# Patient Record
Sex: Female | Born: 1960
Health system: Southern US, Community
[De-identification: ages and names within clinical notes are randomized; demographics above are authoritative.]

## PROBLEM LIST (undated history)

## (undated) DIAGNOSIS — L732 Hidradenitis suppurativa: Secondary | ICD-10-CM

## (undated) DIAGNOSIS — E119 Type 2 diabetes mellitus without complications: Secondary | ICD-10-CM

## (undated) DIAGNOSIS — I10 Essential (primary) hypertension: Secondary | ICD-10-CM

## (undated) DIAGNOSIS — M7989 Other specified soft tissue disorders: Secondary | ICD-10-CM

## (undated) DIAGNOSIS — L97519 Non-pressure chronic ulcer of other part of right foot with unspecified severity: Secondary | ICD-10-CM

## (undated) HISTORY — DX: Hidradenitis suppurativa: L73.2

## (undated) HISTORY — DX: Other specified soft tissue disorders: M79.89

## (undated) HISTORY — DX: Non-pressure chronic ulcer of other part of right foot with unspecified severity: L97.519

## (undated) HISTORY — PX: HERNIA REPAIR: SHX51

## (undated) HISTORY — PX: TONSILLECTOMY: SUR1361

## (undated) HISTORY — PX: CHOLECYSTECTOMY: SHX55

---

## 1997-11-10 ENCOUNTER — Other Ambulatory Visit: Admission: RE | Admit: 1997-11-10 | Discharge: 1997-11-10 | Payer: Self-pay | Admitting: *Deleted

## 1998-03-31 ENCOUNTER — Other Ambulatory Visit: Admission: RE | Admit: 1998-03-31 | Discharge: 1998-03-31 | Payer: Self-pay | Admitting: Obstetrics and Gynecology

## 1998-04-26 ENCOUNTER — Ambulatory Visit (HOSPITAL_COMMUNITY): Admission: RE | Admit: 1998-04-26 | Discharge: 1998-04-26 | Payer: Self-pay | Admitting: Internal Medicine

## 1998-04-26 ENCOUNTER — Encounter: Payer: Self-pay | Admitting: Internal Medicine

## 1999-04-02 ENCOUNTER — Emergency Department (HOSPITAL_COMMUNITY): Admission: EM | Admit: 1999-04-02 | Discharge: 1999-04-02 | Payer: Self-pay | Admitting: Emergency Medicine

## 1999-05-20 ENCOUNTER — Other Ambulatory Visit: Admission: RE | Admit: 1999-05-20 | Discharge: 1999-05-20 | Payer: Self-pay | Admitting: Obstetrics and Gynecology

## 1999-12-07 ENCOUNTER — Encounter: Admission: RE | Admit: 1999-12-07 | Discharge: 1999-12-07 | Payer: Self-pay | Admitting: Family Medicine

## 1999-12-07 ENCOUNTER — Encounter (INDEPENDENT_AMBULATORY_CARE_PROVIDER_SITE_OTHER): Payer: Self-pay | Admitting: *Deleted

## 1999-12-07 ENCOUNTER — Encounter: Payer: Self-pay | Admitting: Family Medicine

## 2000-02-02 ENCOUNTER — Observation Stay (HOSPITAL_COMMUNITY): Admission: RE | Admit: 2000-02-02 | Discharge: 2000-02-03 | Payer: Self-pay | Admitting: General Surgery

## 2000-02-02 ENCOUNTER — Encounter (INDEPENDENT_AMBULATORY_CARE_PROVIDER_SITE_OTHER): Payer: Self-pay | Admitting: Specialist

## 2000-02-02 ENCOUNTER — Encounter (INDEPENDENT_AMBULATORY_CARE_PROVIDER_SITE_OTHER): Payer: Self-pay | Admitting: *Deleted

## 2000-02-02 ENCOUNTER — Encounter: Payer: Self-pay | Admitting: General Surgery

## 2000-05-25 ENCOUNTER — Other Ambulatory Visit: Admission: RE | Admit: 2000-05-25 | Discharge: 2000-05-25 | Payer: Self-pay | Admitting: Obstetrics and Gynecology

## 2001-07-05 ENCOUNTER — Other Ambulatory Visit: Admission: RE | Admit: 2001-07-05 | Discharge: 2001-07-05 | Payer: Self-pay | Admitting: Obstetrics and Gynecology

## 2002-08-12 ENCOUNTER — Other Ambulatory Visit: Admission: RE | Admit: 2002-08-12 | Discharge: 2002-08-12 | Payer: Self-pay | Admitting: Obstetrics and Gynecology

## 2003-12-10 ENCOUNTER — Encounter: Admission: RE | Admit: 2003-12-10 | Discharge: 2003-12-10 | Payer: Self-pay | Admitting: Obstetrics and Gynecology

## 2003-12-23 ENCOUNTER — Encounter: Admission: RE | Admit: 2003-12-23 | Discharge: 2003-12-23 | Payer: Self-pay | Admitting: Diagnostic Radiology

## 2003-12-23 ENCOUNTER — Encounter (INDEPENDENT_AMBULATORY_CARE_PROVIDER_SITE_OTHER): Payer: Self-pay | Admitting: *Deleted

## 2005-05-13 ENCOUNTER — Emergency Department (HOSPITAL_COMMUNITY): Admission: EM | Admit: 2005-05-13 | Discharge: 2005-05-13 | Payer: Self-pay | Admitting: Emergency Medicine

## 2006-12-07 ENCOUNTER — Encounter (INDEPENDENT_AMBULATORY_CARE_PROVIDER_SITE_OTHER): Payer: Self-pay | Admitting: *Deleted

## 2007-12-16 ENCOUNTER — Ambulatory Visit (HOSPITAL_COMMUNITY): Admission: RE | Admit: 2007-12-16 | Discharge: 2007-12-16 | Payer: Self-pay | Admitting: Obstetrics and Gynecology

## 2007-12-16 ENCOUNTER — Encounter (INDEPENDENT_AMBULATORY_CARE_PROVIDER_SITE_OTHER): Payer: Self-pay | Admitting: Obstetrics and Gynecology

## 2009-11-18 ENCOUNTER — Encounter (INDEPENDENT_AMBULATORY_CARE_PROVIDER_SITE_OTHER): Payer: Self-pay | Admitting: *Deleted

## 2009-12-24 ENCOUNTER — Encounter (INDEPENDENT_AMBULATORY_CARE_PROVIDER_SITE_OTHER): Payer: Self-pay | Admitting: *Deleted

## 2009-12-25 ENCOUNTER — Ambulatory Visit (HOSPITAL_COMMUNITY): Admission: RE | Admit: 2009-12-25 | Discharge: 2009-12-25 | Payer: Self-pay | Admitting: Internal Medicine

## 2009-12-25 ENCOUNTER — Ambulatory Visit: Payer: Self-pay | Admitting: Surgery

## 2009-12-25 ENCOUNTER — Encounter: Payer: Self-pay | Admitting: Family Medicine

## 2010-04-13 ENCOUNTER — Encounter (INDEPENDENT_AMBULATORY_CARE_PROVIDER_SITE_OTHER): Payer: Self-pay | Admitting: *Deleted

## 2010-05-23 ENCOUNTER — Ambulatory Visit
Admission: RE | Admit: 2010-05-23 | Discharge: 2010-05-23 | Payer: Self-pay | Source: Home / Self Care | Attending: Gastroenterology | Admitting: Gastroenterology

## 2010-05-23 ENCOUNTER — Encounter (INDEPENDENT_AMBULATORY_CARE_PROVIDER_SITE_OTHER): Payer: Self-pay | Admitting: *Deleted

## 2010-05-25 ENCOUNTER — Telehealth: Payer: Self-pay | Admitting: Internal Medicine

## 2010-05-29 ENCOUNTER — Encounter: Payer: Self-pay | Admitting: Obstetrics and Gynecology

## 2010-06-07 NOTE — Letter (Signed)
Summary: Pre Visit Letter Revised  [facility]  [address] [place], [place] [address]   Phone: [phone]  Fax: [phone]        04/13/2010 MRN: [id number] Natasha Rollins [address] [place], [place]  [address]             Procedure Date:  06/08/2010   Welcome to the Gastroenterology Division at [facility].    You are scheduled to see a nurse for your pre-procedure visit on 05/23/2010 at 4:00 on the 3rd floor at [facility], [address]  We ask that you try to arrive at our office 15 minutes prior to your appointment time to allow for check-in.  Please take a minute to review the attached form.  If you answer "Yes" to one or more of the questions on the first page, we ask that you call the person listed at your earliest opportunity.  If you answer "No" to all of the questions, please complete the rest of the form and bring it to your appointment.    Your nurse visit will consist of discussing your medical and surgical history, your immediate family medical history, and your medications.   If you are unable to list all of your medications on the form, please bring the medication bottles to your appointment and we will list them.  We will need to be aware of both prescribed and over the counter drugs.  We will need to know exact dosage information as well.    Please be prepared to read and sign documents such as consent forms, a financial agreement, and acknowledgement forms.  If necessary, and with your consent, a friend or relative is welcome to sit-in on the nurse visit with you.  Please bring your insurance card so that we may make a copy of it.  If your insurance requires a referral to see a specialist, please bring your referral form from your primary care physician.  No co-pay is required for this nurse visit.     If you cannot keep your appointment, please call [phone] to cancel or reschedule prior to your appointment date.  This allows  [place] the opportunity to schedule an appointment for another patient in need of care.    Thank you for choosing [facility] for your medical needs.  We appreciate the opportunity to care for you.  Please visit [place] at our website  to learn more about our practice.  Sincerely, The Gastroenterology Division

## 2010-06-07 NOTE — Letter (Signed)
Summary: Pre Visit No Show Letter  [facility]  [address] [place], [place] [address]   Phone: [phone]  Fax: [phone]        December 24, 2009 MRN: [id number]    Natasha Rollins [address] [place], [place]  [address]    Dear [name],   We have been unable to reach you by phone concerning the pre-procedure visit that you missed on Friday 12-24-09 . For this reason,your procedure scheduled on 01-05-10 has been cancelled. Our scheduling staff will gladly assist you with rescheduling your appointments at a more convenient time. Please call our office at [phone] between the hours of 8:00am and 5:00pm, press option #2 to reach an appointment scheduler. Please consider updating your contact numbers at this time so that we can reach you by phone in the future with schedule changes or results.    Thank you,    Sherren Kerns RN [facility]

## 2010-06-07 NOTE — Letter (Signed)
Summary: Previsit letter  [facility]  [address] [place], [place] [address]   Phone: [phone]  Fax: [phone]       11/18/2009 MRN: [id number]  Natasha Rollins [address] [place], [place]  [address]  Dear Natasha Rollins,  Welcome to the Gastroenterology Division at [facility].    You are scheduled to see a nurse for your pre-procedure visit on December 24, 2009 at 8:30am on the 3rd floor at [facility], [address].  We ask that you try to arrive at our office 15 minutes prior to your appointment time to allow for check-in.  Your nurse visit will consist of discussing your medical and surgical history, your immediate family medical history, and your medications.    Please bring a complete list of all your medications or, if you prefer, bring the medication bottles and we will list them.  We will need to be aware of both prescribed and over the counter drugs.  We will need to know exact dosage information as well.  If you are on blood thinners (Coumadin, Plavix, Aggrenox, Ticlid, etc.) please call our office today/prior to your appointment, as we need to consult with your physician about holding your medication.   Please be prepared to read and sign documents such as consent forms, a financial agreement, and acknowledgement forms.  If necessary, and with your consent, a friend or relative is welcome to sit-in on the nurse visit with you.  Please bring your insurance card so that we may make a copy of it.  If your insurance requires a referral to see a specialist, please bring your referral form from your primary care physician.  No co-pay is required for this nurse visit.     If you cannot keep your appointment, please call [phone] to cancel or reschedule prior to your appointment date.  This allows [place] the opportunity to schedule an appointment for another patient in need of care.    Thank you for choosing [facility] for your  medical needs.  We appreciate the opportunity to care for you.  Please visit [place] at our website  to learn more about our practice.                     Sincerely.                                                                                                                   The Gastroenterology Division

## 2010-06-09 ENCOUNTER — Telehealth: Payer: Self-pay | Admitting: Internal Medicine

## 2010-06-09 NOTE — Miscellaneous (Signed)
Summary: [facility] previsit  Clinical Lists Changes  Medications: Added new medication of DULCOLAX 5 MG  TBEC (BISACODYL) Day before procedure take 2 at 3pm and 2 at 8pm. - Signed Added new medication of METOCLOPRAMIDE HCL 10 MG  TABS (METOCLOPRAMIDE HCL) As per prep instructions. - Signed Added new medication of MIRALAX   POWD (POLYETHYLENE GLYCOL 3350) As per prep  instructions. - Signed Rx of DULCOLAX 5 MG  TBEC (BISACODYL) Day before procedure take 2 at 3pm and 2 at 8pm.;  #4 x 0;  Signed;  Entered by: Karl Bales RN;  Authorized by: Hart Carwin MD;  Method used: Electronically to [facility]. [facility]*, [address], [place], [place], [place]  [address], Ph: [phone] or [phone], Fax: [phone] Rx of METOCLOPRAMIDE HCL 10 MG  TABS (METOCLOPRAMIDE HCL) As per prep instructions.;  #2 x 0;  Signed;  Entered by: Karl Bales RN;  Authorized by: Hart Carwin MD;  Method used: Electronically to [facility]. [facility]*, [address] [place], [place], [place]  [address], Ph: [phone] or [phone], Fax: [phone] Rx of MIRALAX   POWD (POLYETHYLENE GLYCOL 3350) As per prep  instructions.;  #255gm x 0;  Signed;  Entered by: Karl Bales RN;  Authorized by: Hart Carwin MD;  Method used: Electronically to [facility]. [facility]*, [address], [place], [place], [place]  [address], Ph: [phone] or [phone], Fax: [phone] Allergies: Added new allergy or adverse reaction of PCN Observations: Added new observation of ALLERGY REV: Done (05/23/2010 16:11) Added new observation of NKA: F (05/23/2010 16:11)    Prescriptions: MIRALAX   POWD (POLYETHYLENE GLYCOL 3350) As per prep  instructions.  #255gm x 0   Entered by:   Karl Bales RN   Authorized by:   Hart Carwin MD   Signed by:   Karl Bales RN on 05/23/2010   Method used:   Electronically to        [facility]* (retail)       [address]       [place], [place]  [address]       Ph: [phone] or [phone]       Fax: [phone]   RxID:   [id number] METOCLOPRAMIDE HCL 10 MG  TABS (METOCLOPRAMIDE HCL) As per prep instructions.  #2 x 0   Entered by:   Karl Bales RN   Authorized by:   Hart Carwin MD   Signed by:   Karl Bales RN on 05/23/2010   Method used:   Electronically to        [facility]. [facility]* (retail)       [address]       [place], [place]  [address]       Ph: [phone] or [phone]       Fax: [phone]   RxID:   [id number] DULCOLAX 5 MG  TBEC (BISACODYL) Day before procedure take 2 at 3pm and 2 at 8pm.  #4 x 0   Entered by:   Karl Bales RN   Authorized by:   Hart Carwin MD   Signed by:   Karl Bales RN on 05/23/2010   Method used:   Electronically to        [facility]. [facility] (retail)       [address]       [place], [place]  [address]       Ph: [phone] or [phone]       Fax: [phone]   RxID:   [id number]

## 2010-06-09 NOTE — Letter (Signed)
Summary: Diabetic Instructions  [facility]  [address]   [place], [place] [address]   Phone: [phone]  Fax: [phone]    Yashica Binion February 27, 1961 MRN: [id number]   x    ORAL DIABETIC MEDICATION INSTRUCTIONS  The day before your procedure:   Take your diabetic pill as you do normally  The day of your procedure:   Do not take your diabetic pill    We will check your blood sugar levels during the admission process and again in Recovery before discharging you home  ________________________________________________________________________

## 2010-06-09 NOTE — Progress Notes (Signed)
Summary: Res ZCO  Phone Note Call from Patient Call back at [phone]   Call For: Dr Juanda Chance Summary of Call: Needs to change her ZCO scheduled on 06-14-2010. If possible wonders if 07-01-10 is available. It's a teacher's work day. Initial call taken by: Leanor Kail [facility],  May 25, 2010 3:22 PM  Follow-up for Phone Call        Patient advised that her procedure has to be completed at the hospital (due to weight restrictions) and that Dr Juanda Chance is only hospital Dr week of 06/14/10. I explained that we dont have many other options as the full March schedule is not out yet. She states she will actually just keep her 06/14/10 appointment as previously scheduled. Follow-up by: Lamona Curl CMA (AAMA),  May 25, 2010 3:30 PM

## 2010-06-09 NOTE — Letter (Signed)
Summary: Miralax Instructions  [facility]  [address]   [place], [place] [address]   Phone: [phone]  Fax: [phone]       Natasha Rollins    [birth year]    MRN: [id number]       Procedure Day Dorna Bloom: Tuesday, 06-14-10     Arrival Time: 7:30 a.m.     Procedure Time: 8:30 a.m.     Location of Procedure:                     x   [facility] ( Outpatient Registration)       PREPARATION FOR COLONOSCOPY WITH MIRALAX  Starting 5 days prior to your procedure 06-09-10 do not eat nuts, seeds, popcorn, corn, beans, peas,  salads, or any raw vegetables.  Do not take any fiber supplements (e.g. Metamucil, Citrucel, and Benefiber). ____________________________________________________________________________________________________   THE DAY BEFORE YOUR PROCEDURE         DATE: 06-13-10  DAY: Monday  1   Drink clear liquids the entire day-NO SOLID FOOD  2   Do not drink anything colored red or purple.  Avoid juices with pulp.  No orange juice.  3   Drink at least 64 oz. (8 glasses) of fluid/clear liquids during the day to prevent dehydration and help the prep work efficiently.  CLEAR LIQUIDS INCLUDE: Water Jello Ice Popsicles Tea (sugar ok, no milk/cream) Powdered fruit flavored drinks Coffee (sugar ok, no milk/cream) Gatorade Juice: apple, white grape, white cranberry  Lemonade Clear bullion, consomm, broth Carbonated beverages (any kind) Strained chicken noodle soup Hard Candy  4   Mix the entire bottle of Miralax with 64 oz. of Gatorade/Powerade in the morning and put in the refrigerator to chill.  5   At 3:00 pm take 2 Dulcolax/Bisacodyl tablets.  6   At 4:30 pm take one Reglan/Metoclopramide tablet.  7  Starting at 5:00 pm drink one 8 oz glass of the Miralax mixture every 15-20 minutes until you have finished drinking the entire 64 oz.  You should finish drinking prep around 7:30 or 8:00 pm.  8   If you are nauseated, you may take the 2nd  Reglan/Metoclopramide tablet at 6:30 pm.        9    At 8:00 pm take 2 more DULCOLAX/Bisacodyl tablets.     THE DAY OF YOUR PROCEDURE      DATE: 06-14-10 DAY: Tuesday  You may drink clear liquids until 4:30 a.m.  (4 HOURS BEFORE PROCEDURE).   MEDICATION INSTRUCTIONS  Unless otherwise instructed, you should take regular prescription medications with a small sip of water as early as possible the morning of your procedure.  Diabetic patients - see separate instructions.         OTHER INSTRUCTIONS  You will need a responsible adult at least 51 years of age to accompany you and drive you home.   This person must remain in the waiting room during your procedure.  Wear loose fitting clothing that is easily removed.  Leave jewelry and other valuables at home.  However, you may wish to bring a book to read or an iPod/MP3 player to listen to music as you wait for your procedure to start.  Remove all body piercing jewelry and leave at home.  Total time from sign-in until discharge is approximately 2-3 hours.  You should go home directly after your procedure and rest.  You can resume normal activities the day after your procedure.  The day  of your procedure you should not:   Drive   Make legal decisions   Operate machinery   Drink alcohol   Return to work  You will receive specific instructions about eating, activities and medications before you leave.   The above instructions have been reviewed and explained to me by   Karl Bales RN  May 23, 2010 4:50 PM    I fully understand and can verbalize these instructions _____________________________ Date _______

## 2010-06-14 ENCOUNTER — Other Ambulatory Visit: Payer: Self-pay | Admitting: Internal Medicine

## 2010-06-15 NOTE — Progress Notes (Signed)
Summary: Hospital Proceure  Phone Note Call from Patient Call back at [phone]   Caller: Patient Call For: dR. Jaydin Jalomo Reason for Call: Talk to Nurse Summary of Call: Pt is having an issue with her hopsital procedure and needs to speak with nurse Initial call taken by: [place] Johnson,  June 09, 2010 4:50 PM  Follow-up for Phone Call        Patient calling to ask about taking "pillls" as the prep for colonoscopy. Discussed with patient the prep Dr. Juanda Chance prefers and why. She understands. Patient also wanted to know if she would be "put to sleep" for the procedure because she has had trouble with anesthesia. Patient states that with her c-section she had trouble waking up, nausea and vomiting. She also asked about changing her procedure the March 5, 6, 7, 8, or 9th. Explained to her that she needs to have the procedure at the hospital and we are limited on the times for this. She will keep her 06/14/10 appointment. Follow-up by: Jesse Fall RN,  June 10, 2010 9:13 AM  Additional Follow-up for Phone Call Additional follow up Details #1::        Would she prefer to see me in the office to raise her concerns? She canceled in Sept.2011, and rescheduled this year and now she wants different prep. She may need to be Propofol case due to potential resipiratory issues, obesity? If it is just a screening colon there is no rush. I will be happy to meet with her first. Additional Follow-up by: Hart Carwin MD,  June 10, 2010 1:56 PM    Additional Follow-up for Phone Call Additional follow up Details #2::    Spoke with patient and she would like to talk with MD before having the procedure. Cancelled the colonoscopy at the hospital on 06/14/10 (spoke with Arlys John at [facility] endo). Scheduled patient to see MD on 07/15/10 at 3:15 PM. Follow-up by: Jesse Fall RN,  June 10, 2010 2:14 PM  Additional Follow-up for Phone Call Additional follow up Details #3:: Details for Additional Follow-up Action  Taken: thank You, it will work for both of [place], Thanx Additional Follow-up by: Hart Carwin MD,  June 10, 2010 2:15 PM

## 2010-07-08 DIAGNOSIS — Z6841 Body Mass Index (BMI) 40.0 and over, adult: Secondary | ICD-10-CM

## 2010-07-08 DIAGNOSIS — N83209 Unspecified ovarian cyst, unspecified side: Secondary | ICD-10-CM | POA: Insufficient documentation

## 2010-07-15 ENCOUNTER — Encounter: Payer: Self-pay | Admitting: Internal Medicine

## 2010-07-15 ENCOUNTER — Ambulatory Visit (INDEPENDENT_AMBULATORY_CARE_PROVIDER_SITE_OTHER): Payer: BC Managed Care – PPO | Admitting: Internal Medicine

## 2010-07-15 DIAGNOSIS — Z1211 Encounter for screening for malignant neoplasm of colon: Secondary | ICD-10-CM

## 2010-07-19 NOTE — Assessment & Plan Note (Addendum)
Summary: to discuss screening colon per MD/Regina   History of Present Illness Visit Type: Initial Visit Primary GI MD: Lina Sar MD Primary Provider: Sarita Haver Chief Complaint: Discuss colonoscopy, patient has some resevations History of Present Illness:   This is a 50 year old, African American female who is referred for discussion of a screening colonoscopy. Patient had some problems with sedation while undergoing a cholecystectomy and tonsillectomy in the past. Specifically, she had nausea and vomiting. She is having  postcholecystectomy diarrhea and occasional hemorrhoidal irritation. She denies any family history of colon cancer. She has been overweight.   GI Review of Systems      Denies abdominal pain, acid reflux, belching, bloating, chest pain, dysphagia with liquids, dysphagia with solids, heartburn, loss of appetite, nausea, vomiting, vomiting blood, weight loss, and  weight gain.        Denies anal fissure, black tarry stools, change in bowel habit, constipation, diarrhea, diverticulosis, fecal incontinence, heme positive stool, hemorrhoids, irritable bowel syndrome, jaundice, light color stool, liver problems, rectal bleeding, and  rectal pain. Preventive Screening-Counseling & Management  Alcohol-Tobacco     Smoking Status: never    Current Medications (verified): 1)  Metformin Hcl 500 Mg Tabs (Metformin Hcl) .... Once Daily 2)  Diovan Hct 80-12.5 Mg Tabs (Valsartan-Hydrochlorothiazide) .... Once Daily 3)  Amlodipine Besylate 10 Mg Tabs (Amlodipine Besylate) .... Once Daily 4)  Folic Acid 1 Mg Tabs (Folic Acid) .... Once Daily  Allergies (verified): 1)  ! Pcn  Past History:  Past Medical History: Current Problems:  OBESITY (ICD-278.00) OVARIAN CYST (ICD-620.2)   Diabetes  Past Surgical History: Reviewed history from 07/08/2010 and no changes required. Diagnostic hysteroscopy, NovaSure endometrial ablation, D and C.  Cholecystectomy  Family  History: No FH of Colon Cancer:  Social History: Illicit Drug Use - no Occupation: Secretary/administrator Patient has never smoked.  Alcohol Use - no Daily Caffeine Use Smoking Status:  never  Review of Systems       The patient complains of change in vision, urination - excessive, and urine leakage.  The patient denies allergy/sinus, anemia, anxiety-new, arthritis/joint pain, back pain, blood in urine, breast changes/lumps, confusion, cough, coughing up blood, depression-new, fainting, fatigue, fever, headaches-new, hearing problems, heart murmur, heart rhythm changes, itching, menstrual pain, muscle pains/cramps, night sweats, nosebleeds, pregnancy symptoms, shortness of breath, skin rash, sleeping problems, swelling of feet/legs, swollen lymph glands, thirst - excessive , urination - excessive , urination changes/pain, vision changes, and voice change.    Vital Signs:  Patient profile:   50 year old female Height:      64.5 inches Weight:      403.25 pounds BMI:     68.40 Pulse rate:   96 / minute Pulse rhythm:   regular BP sitting:   132 / 86  (left arm) Cuff size:   large  Vitals Entered By: June McMurray CMA Duncan Dull) (July 15, 2010 3:33 PM)   Impression & Recommendations:  Problem # 1:  SPECIAL SCREENING FOR MALIGNANT NEOPLASMS COLON (ICD-V76.51) Patient is a suitable candidate for screening colonoscopy. She is at regular risk for colo rectal cancer.  . She weighs over 400 pounds and therefore has to have her procedure  at [facility] outoatient endoscopy . We will have to get this scheduled . Most likely this will be done after the school ends  when she will have more free time. Her prep would be MiraLax. We have discussed the prep and the sedation. I think she will be OK  for conscious sedation as opposed to Propofol.  Problem # 2:  OBESITY (ICD-278.00) Patient weighs over 400 pounds.  Patient Instructions: 1)  You will need to be scheduled for a colonoscopy. Unfortunately, our  hospital schedule for May and months ahead is not available yet. Our office call you back to schedule your hospital colonoscopy as soon as it becomes available. 2)  The medication list was reviewed and reconciled.  All changed / newly prescribed medications were explained.  A complete medication list was provided to the patient / caregiver. 3)  Copy sent to : Dr Wylene Simmer.  Appended Document: to discuss screening colon per MD/Regina Physical Exam: HEENT:nl Neck supple, no adenopathy Lungs"clear B.S.'s Cor: nl S1,nl S2, no murmur Abdomen: obese soft, normoactive B.S.s Skin :no lesions Psych: alert and oriented

## 2010-08-26 ENCOUNTER — Telehealth: Payer: Self-pay | Admitting: *Deleted

## 2010-08-26 NOTE — Telephone Encounter (Signed)
Message copied by Vernia Buff on Fri Aug 26, 2010  8:47 AM ------      Message from: Vernia Buff      Created: Fri Jul 15, 2010  4:13 PM       Patient needs to be scheduled for hospital colonoscopy (due to weight). No propofol needed per Dr Juanda Chance.

## 2010-08-26 NOTE — Telephone Encounter (Signed)
I have called patient to let her know that we do now have a hospital schedule out for the first week of May or last week of May at which time she may have her colonoscopy (see EMR note 07/15/10). I offered her the first day of June as well (which would be on a Friday). She states that she will have to call back after she looks at her schedule again.

## 2010-09-20 NOTE — Op Note (Signed)
NAME:  Natasha Rollins, Natasha Rollins NO.:  [id number]   MEDICAL RECORD NO.:  [id number]          PATIENT TYPE:  AMB   LOCATION:  SDC                           FACILITY:  [facility]   PHYSICIAN:  Maxie Better, M.D.DATE OF BIRTH:  [birth year]   DATE OF PROCEDURE:  DATE OF DISCHARGE:                               OPERATIVE REPORT   PREOPERATIVE DIAGNOSIS:  Menorrhagia.   PROCEDURE:  Diagnostic hysteroscopy, NovaSure endometrial ablation, D  and C.   POSTOPERATIVE DIAGNOSIS:  Menorrhagia.   ANESTHESIA:  MAC paracervical block.   SURGEON:  Maxie Better, MD   PROCEDURE:  Under adequate monitored anesthesia, the patient was placed  in the dorsal lithotomy position.  She was sterilely prepped and draped  in usual fashion.  Bladder was catheterized for large amount of urine.  Bivalve speculum was placed in the vagina, 20 mL of 1% Nesacaine was  injected paracervically at 3 and 9 o'clock.  The anterior lip of the  cervix was grasped with a single-tooth tenaculum.  Cervix was serially  dilated up to #25 [facility] dilator.  Diagnostic hysteroscope was introduced  into uterine cavity.  No endometrial polyps or fibroids seen.  Both  tubal ostium were seen.  The hysteroscope was removed.  The cavity was  then gently curetted for endometrial curettage.  The cervix was then  serially dilated further to 29 Pratt dilator to accept the NovaSure  apparatus.  The uterus was sounded to 9.5 cm.  The cervical canal was  4.5 cm given a uterine cavity length of 5 cm.  Once the apparatus was  placed, the cavity was checked, and the width of the cavity was 3.4 cm.  The NovaSure was then placed in function, and using a power of 94, 1  minute and 34 seconds worth of ablation was then performed.  Procedure  was then completed, the NovaSure was removed.  The cavity was inspected  with a hysteroscope.  Good ablation was noted throughout the cavity, and  the procedure was felt to have been complete at  which time all  instruments were removed from the vagina.  Specimen with endometrial  curetting sent to pathology.   ESTIMATED BLOOD LOSS:  Minimal.   COMPLICATIONS:  None.   The patient tolerated the procedure well and was transferred to recovery  in stable condition.      Maxie Better, M.D.  Electronically Signed     [place]/MEDQ  D:  12/16/2007  T:  12/17/2007  Job:  [id number]

## 2010-09-23 NOTE — Op Note (Signed)
[facility]  Patient:    Natasha Rollins, Natasha Rollins                   MRN: [id number] Proc. Date: 02/02/00 Adm. Date:  [id number] Attending:  Chevis Pretty S                           Operative Report  PREOPERATIVE DIAGNOSIS:  Symptomatic cholelithiasis.  POSTOPERATIVE DIAGNOSIS:  Symptomatic cholelithiasis.  PROCEDURE:  Laparoscopic cholecystectomy.  SURGEON:  Chevis Pretty, M.D.  ASSISTANT:  Anselm Pancoast. Zachery Dakins, M.D.  ANESTHESIA:  General endotracheal.  DESCRIPTION OF PROCEDURE:  After informed consent was obtained, the patient was brought to the operating room and placed in the supine position on the operating table.  After an adequate induction of general anesthesia, the patients abdomen was prepped with Betadine and draped in the usual sterile manner.  A small transverse supraumbilical incision was made with a 15 blade knife.  This incision was carried down through the subcutaneous tissue using blunt dissection with a Kelly clamp, as well as with [employer] retractors. Some scar from her previous incision was encountered and this was divided sharply with the 15 blade knife.  This was continued until the fascia of the anterior abdominal wall was encountered.  The fascia was then grasped with Kochers and elevated.  The fascial layer was then incised with the 15 blade knife.  This incision was then probed with the Kelly clamp until the peritoneum had been opened and we encountered the omental fat.    Once this was done, an anchoring stitch with a 0 Vicryl was placed in one of the fascial layers, and a Hassan cannula was placed through this hole in the anterior abdominal fascia.  The abdomen was then insufflated with carbon dioxide and the laparoscope was placed through this cannula.  There were many adhesions to the anterior abdominal wall in this area and it was very difficult to see.  We were able to find a clear membrane that we were able to break through  with the camera which allowed [place] access to the free portion of the abdominal cavity around the liver.  Two 5 mm incisions were placed laterally in the right abdominal wall and one slightly large transverse incision was made superiorly along the midline.  A 10 mm port was pushed through the upper midline incision and was placed into the abdominal cavity under direct vision with the laparoscope.  Two smaller 5 mm ports were placed through the two lateral incisions, directly into the abdomen, again under direct vision using the laparoscope.  Once this was accomplished, the camera was moved to the upper midline port. Laparoscopic scissors were then used to sharply take down several of the adhesions, down from around the Rosser cannula so that the [place] was free to move with the abdomen.  Once this was complete, the laparoscope was returned to the Bonnie port.  A blunt grasper was placed through the lateral most 5 mm port and used to grasp the dome of the gallbladder and elevate it anteriorly and superiorly.  Another blunt grasper was placed through the other 5 mm port and used to grasp the infundibulum and neck of the gallbladder.  The laparoscopic cautery was used to open the peritoneal reflection along the neck of the gallbladder.  Once this was done, using blunt dissection with a right angle clamp, the neck of the gallbladder and cystic duct junction was identified and dissected  in a circumferential manner.  The cystic artery was also located just anterior to the cystic duct and also using the right angle clamp, this structure was dissected circumferentially.  Once this was complete, two clips were placed proximally and one distally on the artery, and the artery was divided between the two.  Next, the cystic duct, three clips were placed proximally on the cystic duct and two distally, and the cystic duct was divided between the two.  One other small vessel posteriorly was also clamped  with two clips proximally and one distally, and divided between the two.  Once this was complete, the neck of the gallbladder was free.  The gallbladder was then freed from the liver bed using electrocautery with the hook dissector.  Prior to removing the gallbladder from the liver bed, the liver bed was examined and found to be hemostatic. The gallbladder was then removed the rest of the way from the liver bed, still using the hook electrocautery.  Once this was complete, the laparoscope was moved to the upper midline port and a gallbladder grasper was placed through the [facility] cannula, and grasped with the neck of the gallbladder.  The gallbladder was then removed through the umbilical incision, and the Hassan cannula was removed.  The anterior abdominal fascia at the umbilical incision was then closed with multiple interrupted 0 Vicryl sutures.  The abdomen was then reinsufflated and the umbilical closure was airtight.  The two lateral 5 mm ports were then removed under direct vision and were hemostatic.  The upper midline port was then also removed.  The deep layers of the umbilical incision were closed with a couple of interrupted 3-0 Vicryl in the skin, and all the incisions were closed with the 4-0 Monocryl subcuticular stitch.  Sterile dressings were applied.  The patient tolerated the procedure well.  At the end of the case, all needle, sponge, and instrument counts were correct.  The patient was then awakened and taken to the recovery room in stable condition. DD:  02/02/00 TD:  02/02/00 Job: [id number]

## 2010-10-04 ENCOUNTER — Telehealth: Payer: Self-pay | Admitting: Internal Medicine

## 2010-10-05 NOTE — Telephone Encounter (Signed)
Spoke with patient. She wants to schedule for July and prefers a Friday appointment. Scheduled patient at [facility] endo with Adela Lank on 11/25/10 8:00 AM arrival with 9:00 AM procedure. Booking number [id number]. Scheduled previsit on 11/18/10 at 10:00 AM. Called patient and she was unable to talk but will call me back.

## 2010-10-06 NOTE — Telephone Encounter (Signed)
Spoke with patient and gave her the appointment for the colonoscopy at [facility]. Patient states she has already done the pre visit and still has all her instructions and her rx that she had filled. She understands her instructions and does not want to come for another pre visit.

## 2010-11-24 ENCOUNTER — Encounter: Payer: Self-pay | Admitting: *Deleted

## 2010-11-24 ENCOUNTER — Telehealth: Payer: Self-pay | Admitting: Internal Medicine

## 2010-11-24 MED ORDER — PEG-KCL-NACL-NASULF-NA ASC-C 100 G PO SOLR: ORAL | Status: DC

## 2010-11-24 NOTE — Telephone Encounter (Signed)
Patient calling to confirm how to take prep. Patient had old prep from back in January. Ordered patient Moviprep and went over new instructions. Written instructions up front for patient pick up.

## 2010-11-24 NOTE — Telephone Encounter (Signed)
Left patient a message to call me. 

## 2010-11-25 ENCOUNTER — Other Ambulatory Visit: Payer: BC Managed Care – PPO | Admitting: Internal Medicine

## 2010-11-25 ENCOUNTER — Ambulatory Visit (HOSPITAL_COMMUNITY)
Admission: RE | Admit: 2010-11-25 | Discharge: 2010-11-25 | Disposition: A | Discharge status: Home / Self Care | Payer: BC Managed Care – PPO | Source: Ambulatory Visit | Attending: Internal Medicine | Admitting: Internal Medicine

## 2010-11-25 DIAGNOSIS — K648 Other hemorrhoids: Secondary | ICD-10-CM | POA: Insufficient documentation

## 2010-11-25 DIAGNOSIS — Z1211 Encounter for screening for malignant neoplasm of colon: Secondary | ICD-10-CM | POA: Insufficient documentation

## 2011-02-03 LAB — COMPREHENSIVE METABOLIC PANEL
Alkaline Phosphatase: 61
BUN: 12
GFR calc non Af Amer: >60
Glucose, Bld: 89
Potassium: 4
Total Protein: 7.5

## 2011-02-03 LAB — CBC
HCT: 33.7 — ABNORMAL LOW
MCV: 80
Platelets: 448 — ABNORMAL HIGH
RDW: 17.6 — ABNORMAL HIGH
WBC: 8.9

## 2012-03-16 ENCOUNTER — Ambulatory Visit (INDEPENDENT_AMBULATORY_CARE_PROVIDER_SITE_OTHER): Payer: BC Managed Care – PPO | Admitting: *Deleted

## 2012-03-16 DIAGNOSIS — Z23 Encounter for immunization: Secondary | ICD-10-CM

## 2012-04-03 ENCOUNTER — Emergency Department (HOSPITAL_COMMUNITY)
Admission: EM | Admit: 2012-04-03 | Discharge: 2012-04-03 | Disposition: A | Discharge status: Other Acute Inpatient Hospital | Payer: BC Managed Care – PPO | Source: Home / Self Care | Attending: Family Medicine | Admitting: Family Medicine

## 2012-04-03 ENCOUNTER — Encounter (HOSPITAL_COMMUNITY): Payer: Self-pay | Admitting: *Deleted

## 2012-04-03 ENCOUNTER — Emergency Department (HOSPITAL_COMMUNITY)
Admission: EM | Admit: 2012-04-03 | Discharge: 2012-04-03 | Disposition: A | Discharge status: Home / Self Care | Payer: BC Managed Care – PPO | Attending: Emergency Medicine | Admitting: Emergency Medicine

## 2012-04-03 ENCOUNTER — Emergency Department (HOSPITAL_COMMUNITY): Payer: BC Managed Care – PPO

## 2012-04-03 DIAGNOSIS — K520 Gastroenteritis and colitis due to radiation: Secondary | ICD-10-CM | POA: Insufficient documentation

## 2012-04-03 DIAGNOSIS — R51 Headache: Secondary | ICD-10-CM

## 2012-04-03 DIAGNOSIS — K529 Noninfective gastroenteritis and colitis, unspecified: Secondary | ICD-10-CM

## 2012-04-03 DIAGNOSIS — R197 Diarrhea, unspecified: Secondary | ICD-10-CM

## 2012-04-03 DIAGNOSIS — E119 Type 2 diabetes mellitus without complications: Secondary | ICD-10-CM | POA: Insufficient documentation

## 2012-04-03 DIAGNOSIS — Z79899 Other long term (current) drug therapy: Secondary | ICD-10-CM | POA: Insufficient documentation

## 2012-04-03 DIAGNOSIS — I10 Essential (primary) hypertension: Secondary | ICD-10-CM | POA: Insufficient documentation

## 2012-04-03 DIAGNOSIS — R112 Nausea with vomiting, unspecified: Secondary | ICD-10-CM | POA: Insufficient documentation

## 2012-04-03 HISTORY — DX: Type 2 diabetes mellitus without complications: E11.9

## 2012-04-03 HISTORY — DX: Essential (primary) hypertension: I10

## 2012-04-03 LAB — POCT I-STAT, CHEM 8
Creatinine, Ser: 0.7 mg/dL (ref 0.50–1.10)
HCT: 42 % (ref 36.0–46.0)
Hemoglobin: 14.3 g/dL (ref 12.0–15.0)
Potassium: 4.1 mEq/L (ref 3.5–5.1)
Sodium: 137 mEq/L (ref 135–145)

## 2012-04-03 LAB — POCT URINALYSIS DIP (DEVICE)
Bilirubin Urine: NEGATIVE
Glucose, UA: NEGATIVE mg/dL
Ketones, ur: NEGATIVE mg/dL
Leukocytes, UA: NEGATIVE
pH: 5 (ref 5.0–8.0)

## 2012-04-03 MED ORDER — HYDROMORPHONE HCL PF 1 MG/ML IJ SOLN
1.0000 mg | Freq: Once | INTRAMUSCULAR | Status: AC
  Administered 2012-04-03: 1 mg via INTRAMUSCULAR
  Filled 2012-04-03: qty 1

## 2012-04-03 MED ORDER — HYDROCODONE-ACETAMINOPHEN 5-325 MG PO TABS
1.0000 | ORAL_TABLET | Freq: Once | ORAL | Status: AC
Start: 2012-04-03 — End: 2012-04-03
  Administered 2012-04-03: 1 via ORAL

## 2012-04-03 MED ORDER — ONDANSETRON HCL 8 MG PO TABS: 8.0000 mg | ORAL_TABLET | Freq: Three times a day (TID) | ORAL | Status: DC | PRN

## 2012-04-03 MED ORDER — METOCLOPRAMIDE HCL 5 MG/ML IJ SOLN
10.0000 mg | Freq: Once | INTRAMUSCULAR | Status: AC
  Administered 2012-04-03: 10 mg via INTRAMUSCULAR
  Filled 2012-04-03 (×2): qty 2

## 2012-04-03 MED ORDER — ONDANSETRON 4 MG PO TBDP
8.0000 mg | ORAL_TABLET | Freq: Once | ORAL | Status: AC
  Administered 2012-04-03: 8 mg via ORAL

## 2012-04-03 MED ORDER — DEXAMETHASONE SODIUM PHOSPHATE 10 MG/ML IJ SOLN
10.0000 mg | Freq: Once | INTRAMUSCULAR | Status: AC
  Administered 2012-04-03: 10 mg via INTRAMUSCULAR
  Filled 2012-04-03 (×2): qty 1

## 2012-04-03 MED ORDER — HYDROCODONE-ACETAMINOPHEN 5-325 MG PO TABS: 1.0000 | ORAL_TABLET | ORAL | Status: DC | PRN

## 2012-04-03 MED ORDER — HYDROCODONE-ACETAMINOPHEN 5-325 MG PO TABS
ORAL_TABLET | ORAL | Status: AC
  Filled 2012-04-03: qty 1

## 2012-04-03 MED ORDER — DIPHENHYDRAMINE HCL 25 MG PO CAPS
50.0000 mg | ORAL_CAPSULE | Freq: Once | ORAL | Status: AC
  Administered 2012-04-03: 50 mg via ORAL
  Filled 2012-04-03: qty 2
  Filled 2012-04-03: qty 1

## 2012-04-03 MED ORDER — PROMETHAZINE HCL 25 MG PO TABS: 25.0000 mg | ORAL_TABLET | Freq: Four times a day (QID) | ORAL | Status: DC | PRN

## 2012-04-03 NOTE — ED Provider Notes (Signed)
History     CSN: [id number]  Arrival date & time 04/03/12  1329   First MD Initiated Contact with Patient 04/03/12 1422      Chief Complaint  Patient presents with  . Headache  . Hypertension    (Consider location/radiation/quality/duration/timing/severity/associated sxs/prior treatment) HPI Comments: Natasha Rollins presents with a 24 hour history of left sided headache with increased blood pressure.  She woke yesterday morning with this headache, then she developed nausea, vomiting and diarrhea since shortly after eating a chicken salad at lunch yesterday at work. She states that possibly the chicken was not properly chilled as the fridge she keeps it in at work felt warmer than normal yesterday.  She denies fevers or chills,  But has had watery, nonbloody diarrhea every hour on averaged,  Brown and watery.  She has taken no medications for nausea and diarrhea. She had one episode of emesis yesterday shortly before the diarrhea began.  She was given a zofran at the [facility] prior to being transferred down here which has helped her nausea.  She denies abdominal pain, also denies focal weakness or numbness,  Visual changes, shortness of breath, chest pain and dizziness.  She has been compliant with her medicines,  And takes her diovan at night - she took last nights dose.  The history is provided by the patient.    Past Medical History  Diagnosis Date  . Hypertension   . Diabetes mellitus without complication     Past Surgical History  Procedure Date  . Tonsillectomy   . Cholecystectomy   . Hernia repair     No family history on file.  History  Substance Use Topics  . Smoking status: Never Smoker   . Smokeless tobacco: Not on file  . Alcohol Use: No    OB History    Grav Para Term Preterm Abortions TAB SAB Ect Mult Living                  Review of Systems  Constitutional: Negative for fever.  HENT: Negative for congestion, sore throat and neck pain.   Eyes:  Negative.  Negative for visual disturbance.  Respiratory: Negative for chest tightness and shortness of breath.   Cardiovascular: Negative for chest pain.  Gastrointestinal: Positive for nausea, vomiting and diarrhea. Negative for abdominal pain and blood in stool.  Genitourinary: Negative.   Musculoskeletal: Negative for joint swelling and arthralgias.  Skin: Negative.  Negative for rash and wound.  Neurological: Positive for headaches. Negative for dizziness, weakness, light-headedness and numbness.  Hematological: Negative.   Psychiatric/Behavioral: Negative.     Allergies  Penicillins  Home Medications   Current Outpatient Rx  Name  Route  Sig  Dispense  Refill  . FOLIC ACID 1 MG PO TABS   Oral   Take 1 mg by mouth daily.         Marland Kitchen LINAGLIPTIN-METFORMIN HCL 2.5-850 MG PO TABS   Oral   Take 1 tablet by mouth 2 (two) times daily.         Marland Kitchen VALSARTAN 320 MG PO TABS   Oral   Take 320 mg by mouth daily.         Marland Kitchen HYDROCODONE-ACETAMINOPHEN 5-325 MG PO TABS   Oral   Take 1 tablet by mouth every 4 (four) hours as needed for pain.   15 tablet   0   . PROMETHAZINE HCL 25 MG PO TABS   Oral   Take 1 tablet (25 mg total) by  mouth every 6 (six) hours as needed for nausea.   15 tablet   0     BP 143/64  Pulse 88  Temp 99.2 F (37.3 C) (Oral)  Resp 20  SpO2 96%  Physical Exam  Nursing note and vitals reviewed. Constitutional: She is oriented to person, place, and time. She appears well-developed and well-nourished.       Morbidly obese.  HENT:  Head: Normocephalic and atraumatic.  Mouth/Throat: Oropharynx is clear and moist.  Eyes: EOM are normal. Pupils are equal, round, and reactive to light.  Neck: Normal range of motion. Neck supple.  Cardiovascular: Normal rate and normal heart sounds.   Pulmonary/Chest: Effort normal. No respiratory distress. She has no rales.  Abdominal: Soft. There is no tenderness. There is no rebound.  Musculoskeletal: Normal range  of motion. She exhibits no edema.  Lymphadenopathy:    She has no cervical adenopathy.  Neurological: She is alert and oriented to person, place, and time. She has normal strength. No sensory deficit. Gait normal. GCS eye subscore is 4. GCS verbal subscore is 5. GCS motor subscore is 6.        Cranial nerves III-XII intact.  No pronator drift.  Skin: Skin is warm and dry. No rash noted.  Psychiatric: She has a normal mood and affect. Her speech is normal and behavior is normal. Thought content normal. Cognition and memory are normal.    ED Course  Procedures (including critical care time)  Labs Reviewed - No data to display Ct Head Wo Contrast  04/03/2012  *RADIOLOGY REPORT*  Clinical Data: Headache.  Hypertension.  CT HEAD WITHOUT CONTRAST  Technique:  Contiguous axial images were obtained from the base of the skull through the vertex without contrast.  Comparison: No priors.  Findings: No acute intracranial abnormalities.  Specifically, no signs of acute intracranial hemorrhage, no definite area of acute/subacute cerebral ischemia, no mass, mass effect, hydrocephalus or abnormal intra or extra-axial fluid collections. There are scattered patchy areas of decreased attenuation throughout the deep and periventricular white matter of the cerebral hemispheres bilaterally, most compatible with chronic microvascular ischemic disease.  No acute displaced skull fractures are identified.  Visualized paranasal sinuses and mastoids are well pneumatized.  IMPRESSION: 1.  No acute intracranial abnormalities. 2.  Mild chronic microvascular ischemic changes in the deep and periventricular white matter of the cerebral hemispheres bilaterally.   Original Report Authenticated By: Trudie Reed, M.D.      1. Gastroenteritis   2. Hypertension   3. Headache      Dilaudid 1 mg IM given with no relief of headache,  Although bp improved.  Given migraine cocktail after back from Ct - decadron IM,  reglan IM and  benadryl PO.  Headache resolved.  Has tolerated gingerale, feels improved.  MDM  Gradual worsening headache with later onset of vomiting and diarrhea with suspected ingestion of improperly stored chicken.  Labs from [facility] visit reviewed - glucose 181, moderate protein in urine , otherwise normal,  CT scan here with no acute findings.  Pt discussed with Dr Manus Gunning.  With pt improved and tolerating PO's will send home with B.r.a.t diet instructions, phenergan and hydrocodone prn nausea and headache pain.  Advised to allow diarrhea to run its course (again,  No diarrhea in the hours she has been here).  Increased fluid intake.  Recheck of bp by pcp within 1 week.     Date: 04/03/2012  Rate: 77  Rhythm: normal sinus rhythm  QRS Axis:  normal  Intervals: normal  ST/T Wave abnormalities: normal  Conduction Disutrbances:none  Narrative Interpretation:   Old EKG Reviewed: none available       Burgess Amor, PA 04/03/12 1936  Burgess Amor, [place] 04/03/12 1939

## 2012-04-03 NOTE — ED Notes (Signed)
Reports headache which started yesterday.   Has tried tylenol 6 pm (2) 500 mg  but no relief.  Reports nausea and vomiting.

## 2012-04-03 NOTE — ED Provider Notes (Signed)
History     CSN: [id number]  Arrival date & time 04/03/12  1014   First MD Initiated Contact with Patient 04/03/12 1147      Chief Complaint  Patient presents with  . Headache    (Consider location/radiation/quality/duration/timing/severity/associated sxs/prior treatment) HPI Comments: 51 year old female with history of morbid obesity, hypertension and diabetes. Reports compliance with antihypertensive medications. Here complaining of persistent headache since yesterday associated with nausea, one episode of nonbloody emesis and diarrhea about 18 times since afternoon yesterday. Patient reports that she ate a chicken salad in school yesterday and started to feel sick after. Has not had any fluids or solid intake since afternoon yesterday. Denies abdominal pain, chest pain or difficulty breathing. Denies visual changes, balance or gait problems denies extremity numbness, weakness or paresthesias. Patient reports she had discomfort in the back of her neck just today which is improved today. Denies dysuria or hematuria. Denies urinary frequency or polyuria.   No past medical history on file.  No past surgical history on file.  No family history on file.  History  Substance Use Topics  . Smoking status: Not on file  . Smokeless tobacco: Not on file  . Alcohol Use: Not on file    OB History    No data available      Review of Systems  Constitutional: Positive for appetite change. Negative for fever and chills.  HENT: Positive for mouth sores. Negative for congestion and sore throat.   Respiratory: Negative for cough, shortness of breath and wheezing.   Gastrointestinal: Positive for nausea, vomiting and diarrhea. Negative for abdominal pain.  Skin: Negative for rash.  Neurological: Positive for headaches.  All other systems reviewed and are negative.    Allergies  Penicillins  Home Medications   Current Outpatient Rx  Name  Route  Sig  Dispense  Refill  .  PEG-KCL-NACL-NASULF-NA ASC-C 100 G PO SOLR      Take as directed   1 kit   0     BP 150/100  Pulse 87  Temp 98.3 F (36.8 C) (Oral)  Resp 20  SpO2 97%  Physical Exam  Nursing note and vitals reviewed. Constitutional: She is oriented to person, place, and time. She appears well-developed and well-nourished. No distress.       Manual blood pressure checked by myself 168/105  HENT:  Head: Normocephalic and atraumatic.  Right Ear: External ear normal.  Left Ear: External ear normal.  Nose: Nose normal.  Mouth/Throat: No oropharyngeal exudate.       Dry lips  Eyes: Conjunctivae normal and EOM are normal. Pupils are equal, round, and reactive to light. Right eye exhibits no discharge. Left eye exhibits no discharge. No scleral icterus.  Neck: Normal range of motion. Neck supple. No JVD present. No thyromegaly present.  Cardiovascular: Normal rate, regular rhythm, normal heart sounds and intact distal pulses.  Exam reveals no gallop and no friction rub.   No murmur heard. Pulmonary/Chest: Effort normal and breath sounds normal. No respiratory distress. She has no wheezes. She has no rales. She exhibits no tenderness.  Abdominal: Soft. There is no tenderness. There is no rebound and no guarding.       Morbidly obese  Lymphadenopathy:    She has no cervical adenopathy.  Neurological: She is alert and oriented to person, place, and time. She has normal strength. No cranial nerve deficit or sensory deficit. She exhibits normal muscle tone. She displays a negative Romberg sign. Coordination and gait normal.  No face drop. No arm drift. Tongue central. Visual fields normal by comparison.   Skin: No rash noted. She is not diaphoretic.    ED Course  Procedures (including critical care time)  Labs Reviewed  POCT I-STAT, CHEM 8 - Abnormal; Notable for the following:    Glucose, Bld 181 (*)     All other components within normal limits  POCT URINALYSIS DIP (DEVICE) - Abnormal;  Notable for the following:    Hgb urine dipstick MODERATE (*)     Protein, ur >=300 (*)     All other components within normal limits   No results found.   1. Headache   2. Hypertension   3. Diarrhea     EKG: normal sinus rhythm with a ventricular rate 87 beats per minutes, no acute ischemic changes.  MDM  51 year old female with history of morbid obesity, hypertension and diabetes.  Here complaining of persistent headache since yesterday associated with nausea one episode of nonbloody emesis and diarrhea about 18 times since last night. Denies abdominal pain, chest pain or difficulty breathing. On exam: Blood pressure is difficult to quantify and several manual repeats including mine patient blood pressure is 160's/100's. Lungs are clear no low extremity edema. No focal neurologic findings on exam.  Patient was treated with Norco 325 mg over 5 mg no improvement of her headache. Had 8 mg of ondansetron sublingual and was tolerating fluids with no vomiting in the last 2 hours. Electrolytes are normal the patient does have proteinuria over 300 in point-of-care urinalysis. Patient not feeling well still reporting severe headache decided to transfer to the emergency department for further evaluation and management.         Sharin Grave, MD 04/03/12 1451

## 2012-04-03 NOTE — ED Provider Notes (Signed)
Medical screening examination/treatment/procedure(s) were performed by non-physician practitioner and as supervising physician I was immediately available for consultation/collaboration.   Glynn Octave, MD 04/03/12 [id number]

## 2012-04-03 NOTE — ED Notes (Signed)
Report given and care transferred to Kim, RN.

## 2012-04-03 NOTE — ED Notes (Signed)
To ED for eval of HA. Sent from [facility] for further eval. States her HA started yesterday. Pt is on BP meds daily. No injury to head. States she ate a salad yesterday for lunch and following ingestion she started vomiting. Speech is clear. Ambulatory. No photophobia.

## 2012-12-22 ENCOUNTER — Ambulatory Visit (INDEPENDENT_AMBULATORY_CARE_PROVIDER_SITE_OTHER): Payer: BC Managed Care – PPO | Admitting: Family Medicine

## 2012-12-22 VITALS — BP 171/82 | HR 113 | Temp 99.5°F | Resp 20 | Ht 64.0 in | Wt 391.8 lb

## 2012-12-22 DIAGNOSIS — R21 Rash and other nonspecific skin eruption: Secondary | ICD-10-CM

## 2012-12-22 DIAGNOSIS — I10 Essential (primary) hypertension: Secondary | ICD-10-CM | POA: Insufficient documentation

## 2012-12-22 DIAGNOSIS — E119 Type 2 diabetes mellitus without complications: Secondary | ICD-10-CM

## 2012-12-22 DIAGNOSIS — M7121 Synovial cyst of popliteal space [Baker], right knee: Secondary | ICD-10-CM

## 2012-12-22 DIAGNOSIS — M7120 Synovial cyst of popliteal space [Baker], unspecified knee: Secondary | ICD-10-CM

## 2012-12-22 MED ORDER — DICLOFENAC SODIUM 75 MG PO TBEC: 75.0000 mg | DELAYED_RELEASE_TABLET | Freq: Two times a day (BID) | ORAL | Status: DC

## 2012-12-22 MED ORDER — TRIAMCINOLONE ACETONIDE 0.1 % EX CREA: TOPICAL_CREAM | Freq: Two times a day (BID) | CUTANEOUS | Status: DC

## 2012-12-22 NOTE — Progress Notes (Signed)
51 year old woman with right popliteal pain for several days and rough darken skin on her cheeks for several days.  Objective: Patient is morbidly obese. I spent over 20 minutes face-to-face with patient talked about her diet. We discussed different things teeth as well as avoiding prepared meals and sweetened drinks.  Patient has hyperpigmentation and rough skin over both cheeks.  Examination right knee reveals no effusion, no anterior tenderness or laxity of ligaments, tenderness in the popliteal area with slight bulge.  Assessment: Morbid obesity with patient really wants to lose weight. Facial rash which may be an early form of rosacea.  Plan: I gave patient a number of suggestions on diet Diabetes  Hypertension  Facial rash - Plan: triamcinolone cream (KENALOG) 0.1 %, DISCONTINUED: triamcinolone cream (KENALOG) 0.1 %, DISCONTINUED: triamcinolone cream (KENALOG) 0.1 %  Baker's cyst, right - Plan: diclofenac (VOLTAREN) 75 MG EC tablet, DISCONTINUED: diclofenac (VOLTAREN) 75 MG EC tablet, DISCONTINUED: diclofenac (VOLTAREN) 75 MG EC tablet  Signed, Elvina Sidle, MD

## 2013-02-25 ENCOUNTER — Ambulatory Visit (INDEPENDENT_AMBULATORY_CARE_PROVIDER_SITE_OTHER): Payer: BC Managed Care – PPO | Admitting: Radiology

## 2013-02-25 DIAGNOSIS — Z23 Encounter for immunization: Secondary | ICD-10-CM

## 2013-03-13 ENCOUNTER — Other Ambulatory Visit: Payer: Self-pay | Admitting: Family Medicine

## 2013-03-14 NOTE — Telephone Encounter (Signed)
Dr L, you Rxd this for acute issue in Aug. Do you want to RF or have pt RTC?

## 2013-05-13 ENCOUNTER — Telehealth: Payer: Self-pay

## 2013-05-13 NOTE — Telephone Encounter (Signed)
Spoke with pt she sates her son was positive for flu. She states she spoke with someone earlier and we were supposed to call in Tamiflu. i see no recoed of this and explained to the pt that she would have to come in to be seen for an Rx. Please advise

## 2013-05-14 NOTE — Telephone Encounter (Signed)
Agree. Pt needs to be seen.

## 2013-05-15 ENCOUNTER — Ambulatory Visit (INDEPENDENT_AMBULATORY_CARE_PROVIDER_SITE_OTHER): Payer: BC Managed Care – PPO | Admitting: Family Medicine

## 2013-05-15 VITALS — BP 116/74 | HR 101 | Temp 98.1°F | Resp 19 | Ht 64.0 in | Wt 380.0 lb

## 2013-05-15 DIAGNOSIS — M545 Low back pain, unspecified: Secondary | ICD-10-CM

## 2013-05-15 DIAGNOSIS — R6889 Other general symptoms and signs: Secondary | ICD-10-CM

## 2013-05-15 DIAGNOSIS — E119 Type 2 diabetes mellitus without complications: Secondary | ICD-10-CM

## 2013-05-15 DIAGNOSIS — Z20828 Contact with and (suspected) exposure to other viral communicable diseases: Secondary | ICD-10-CM

## 2013-05-15 DIAGNOSIS — R21 Rash and other nonspecific skin eruption: Secondary | ICD-10-CM

## 2013-05-15 LAB — POCT INFLUENZA A/B
Influenza A, POC: NEGATIVE
Influenza B, POC: NEGATIVE

## 2013-05-15 LAB — POCT UA - MICROSCOPIC ONLY
Casts, Ur, LPF, POC: NEGATIVE
Crystals, Ur, HPF, POC: NEGATIVE
Yeast, UA: NEGATIVE

## 2013-05-15 LAB — POCT URINALYSIS DIPSTICK
Bilirubin, UA: NEGATIVE
Glucose, UA: NEGATIVE
Leukocytes, UA: NEGATIVE
Nitrite, UA: NEGATIVE
PH UA: 5.5
PROTEIN UA: 100
RBC UA: NEGATIVE
SPEC GRAV UA: 1.025
UROBILINOGEN UA: 0.2

## 2013-05-15 LAB — GLUCOSE, POCT (MANUAL RESULT ENTRY): POC Glucose: 271 mg/dl — AB (ref 70–99)

## 2013-05-15 MED ORDER — TRIAMCINOLONE ACETONIDE 0.1 % EX CREA: TOPICAL_CREAM | Freq: Two times a day (BID) | CUTANEOUS | Status: DC | PRN

## 2013-05-15 MED ORDER — OSELTAMIVIR PHOSPHATE 75 MG PO CAPS: 75.0000 mg | ORAL_CAPSULE | Freq: Every day | ORAL | Status: DC

## 2013-05-15 MED ORDER — DICLOFENAC SODIUM 75 MG PO TBEC: DELAYED_RELEASE_TABLET | ORAL | Status: DC

## 2013-05-15 NOTE — Progress Notes (Signed)
Chief Complaint:  Chief Complaint  Patient presents with  . Nausea    x3 days   . Fever  . Nasal Congestion  . rx refills    kenalog, voltaren  . Back Pain    left side, goes hours without urination today     HPI: Natasha Rollins is a 53 y.o. female who is here for  A 3 day history of Nasal congestion, runny nose, nausea with coughing and PND Has had had fevers and chills. Started on Sunday. Felt scratchy throat and sinus drainage . Tuesday was when she was really felt sick She is a diabetic last  HbA1c in Novemebr was 8.5, Dr Tisovec ( endrocrinogist) No sore throat currently, Has had cough, not dry but not sure what she is producing She had subjective fevers, Ears feel stopped up. Had flu vaccine in October, has had flu exposure, son was sick with flu dx a couple of days ago Denies neuropathy, SON, CP, dizziness, wheesing She would like to be referred to another endocrinologist for her diabetes   Past Medical History  Diagnosis Date  . Hypertension   . Diabetes mellitus without complication    Past Surgical History  Procedure Laterality Date  . Tonsillectomy    . Cholecystectomy    . Hernia repair     History   Social History  . Marital Status: Married    Spouse Name: N/A    Number of Children: N/A  . Years of Education: N/A   Social History Main Topics  . Smoking status: Never Smoker   . Smokeless tobacco: None  . Alcohol Use: No  . Drug Use: No  . Sexual Activity: Yes   Other Topics Concern  . None   Social History Narrative  . None   History reviewed. No pertinent family history. Allergies  Allergen Reactions  . Penicillins     REACTION: rash   Prior to Admission medications   Medication Sig Start Date End Date Taking? Authorizing Provider  diclofenac (VOLTAREN) 75 MG EC tablet TAKE 1 TABLET BY MOUTH TWICE A DAY 03/13/13  Yes Kurt Lauenstein, MD  folic acid (FOLVITE) 1 MG tablet Take 1 mg by mouth daily.   Yes Historical Provider, MD    HYDROcodone-acetaminophen (NORCO/VICODIN) 5-325 MG per tablet Take 1 tablet by mouth every 4 (four) hours as needed for pain. 04/03/12  Yes Julie Idol, PA-C  Linagliptin-Metformin HCl (JENTADUETO) 2.5-850 MG TABS Take 1 tablet by mouth 2 (two) times daily.   Yes Historical Provider, MD  triamcinolone cream (KENALOG) 0.1 % Apply topically 2 (two) times daily. 12/22/12  Yes Kurt Lauenstein, MD  valsartan (DIOVAN) 320 MG tablet Take 320 mg by mouth daily.   Yes Historical Provider, MD  promethazine (PHENERGAN) 25 MG tablet Take 1 tablet (25 mg total) by mouth every 6 (six) hours as needed for nausea. 04/03/12   Julie Idol, PA-C     ROS: The patient denies fevers, chills, night sweats, unintentional weight loss, chest pain, palpitations, wheezing, dyspnea on exertion, nausea, vomiting, abdominal pain, dysuria, hematuria, melena, numbness, weakness, or tingling.   All other systems have been reviewed and were otherwise negative with the exception of those mentioned in the HPI and as above.    PHYSICAL EXAM: Filed Vitals:   05/15/13 1744  BP: 116/74  Pulse: 101  Temp: 98.1 F (36.7 C)  Resp: 19  Spo2     99 % Filed Vitals:   05/15/13 1744  Height: 5\' 4"  (  1.626 m)  Weight: 380 lb (172.367 kg)   Body mass index is 65.19 kg/(m^2).  General: Alert, no acute distress, obese AA female HEENT:  Normocephalic, atraumatic, oropharynx patent. EOMI, PERRLA, nontender sinuses, TM nl, no exudates, erythematous throat with clear PND Cardiovascular:  Regular rate and rhythm, no rubs murmurs or gallops.  No Carotid bruits, radial pulse intact. No pedal edema.  Respiratory: Clear to auscultation bilaterally.  No wheezes, rales, or rhonchi.  No cyanosis, no use of accessory musculature GI: No organomegaly, abdomen is soft and non-tender, positive bowel sounds.  No masses. Skin: ? Facial eczema Neurologic: Facial musculature symmetric. Psychiatric: Patient is appropriate throughout our  interaction. Lymphatic: No cervical lymphadenopathy Musculoskeletal: Gait intact.   LABS: Results for orders placed in visit on 05/15/13  POCT URINALYSIS DIPSTICK      Result Value Range   Color, UA yellow     Clarity, UA clear     Glucose, UA neg     Bilirubin, UA neg     Ketones, UA trace     Spec Grav, UA 1.025     Blood, UA neg     pH, UA 5.5     Protein, UA 100     Urobilinogen, UA 0.2     Nitrite, UA neg     Leukocytes, UA Negative    POCT UA - MICROSCOPIC ONLY      Result Value Range   WBC, Ur, HPF, POC 0-1     RBC, urine, microscopic 0-1     Bacteria, U Microscopic trace     Mucus, UA moderate     Epithelial cells, urine per micros 3-6     Crystals, Ur, HPF, POC neg     Casts, Ur, LPF, POC neg     Yeast, UA neg     Amorphous moderate    GLUCOSE, POCT (MANUAL RESULT ENTRY)      Result Value Range   POC Glucose 271 (*) 70 - 99 mg/dl  POCT INFLUENZA A/B      Result Value Range   Influenza A, POC Negative     Influenza B, POC Negative       EKG/XRAY:   Primary read interpreted by Dr. Marin Comment at [facility].   ASSESSMENT/PLAN: Encounter Diagnoses  Name Primary?  . Lower back pain   . Flu-like symptoms   . Type II or unspecified type diabetes mellitus without mention of complication, not stated as uncontrolled   . Exposure to the flu Yes   OTC cough med and nasocort most likely viral but since had exposure to flu will give Tamiflu for ppx She probably has nause from mucus in back of throat I am hoping the tamiflu and the cough meds and nasocort will clear some of that away Rx Tamiflu 75 mg daily x 10 days due to flu exposure Advise her to F/u with endocrinologist as scheduled ( she states she will not do this) , in the meantime will refer her to another one at her request Monitor fasting glucose Work note given Refilled voltaren 30 day supply only snce did not get labs and dont know where creatinine level is at Refilld triamcinolone cream ? Eczematous  dermatitis  F/u prn  Gross sideeffects, risk and benefits, and alternatives of medications d/w patient. Patient is aware that all medications have potential sideeffects and we are unable to predict every sideeffect or drug-drug interaction that may occur.  Ridhima Golberg, [place], DO 05/15/2013 6:44 PM

## 2013-05-15 NOTE — Telephone Encounter (Signed)
Called her to advise.  

## 2013-05-26 ENCOUNTER — Encounter: Payer: Self-pay | Admitting: *Deleted

## 2013-07-30 ENCOUNTER — Other Ambulatory Visit: Payer: Self-pay | Admitting: Endocrinology

## 2013-07-30 DIAGNOSIS — E049 Nontoxic goiter, unspecified: Secondary | ICD-10-CM

## 2013-08-18 ENCOUNTER — Other Ambulatory Visit: Payer: BC Managed Care – PPO

## 2013-11-14 ENCOUNTER — Other Ambulatory Visit: Payer: BC Managed Care – PPO

## 2014-02-01 ENCOUNTER — Other Ambulatory Visit: Payer: Self-pay | Admitting: Family Medicine

## 2014-02-02 ENCOUNTER — Telehealth: Payer: Self-pay | Admitting: *Deleted

## 2014-02-02 NOTE — Telephone Encounter (Signed)
REFILL GIVEN IF ANY MORE FLARE UPS SHE WILL MAKE AN APPT. FOR FURTHER REFILLS

## 2014-02-24 ENCOUNTER — Ambulatory Visit (INDEPENDENT_AMBULATORY_CARE_PROVIDER_SITE_OTHER): Payer: BC Managed Care – PPO | Admitting: Family Medicine

## 2014-02-24 VITALS — BP 132/74 | HR 74 | Temp 98.2°F | Resp 20 | Ht 65.0 in | Wt 381.0 lb

## 2014-02-24 DIAGNOSIS — Z23 Encounter for immunization: Secondary | ICD-10-CM

## 2014-02-24 DIAGNOSIS — R21 Rash and other nonspecific skin eruption: Secondary | ICD-10-CM

## 2014-02-24 DIAGNOSIS — L02416 Cutaneous abscess of left lower limb: Secondary | ICD-10-CM

## 2014-02-24 MED ORDER — METRONIDAZOLE 1 % EX GEL: Freq: Every day | CUTANEOUS | Status: DC

## 2014-02-24 MED ORDER — DOXYCYCLINE HYCLATE 100 MG PO TABS: 100.0000 mg | ORAL_TABLET | Freq: Two times a day (BID) | ORAL | Status: DC

## 2014-02-24 NOTE — Patient Instructions (Signed)
The abscess on your left leg is already draining.  I think it will do ok, but we do need to keep an eye on it.  If you have more pain, more swelling or redness or any fever come back right away.  Take the doxycycline twice a day for the infection, and use some warm compresses on the area  You got your flu shot today  You may have some mild rosacea. It is ok to use the triamcinolone on occasion, but it can be damaging to facial skin if overused.  Try the metronidazole gel once a day as needed.  Also, try a good thick but gentle moisturizer.  "Boots" sensitive skin moisturizer (available at [employer]) is one good option.

## 2014-02-24 NOTE — Progress Notes (Signed)
[facility] [address] [place] [address] [phone]  Date:  02/24/2014   Name:  Natasha Rollins   DOB:  04-03-1961   MRN:  [id number]  PCP:  Haywood Pao, MD    Chief Complaint: Cyst, rash on face and Sinus Problem   History of Present Illness:  Natasha Rollins is a 53 y.o. very pleasant female patient who presents with the following:  She is here with a couple of concerns today.  She has noted an abscess on her thigh- this was present for about 5 days, burst 4 days ago.    She also has noted sinus congestion for about 2 days.  She is note sure if this might be due to the change in the weather. She has some runny and some stuffy nose.  Occasional dry cough.  No fever that she has noted.  Sx are worse at night, feels better now.    She also needs a refill of the cream that she uses on her face every now and again for a rash.    Patient Active Problem List   Diagnosis Date Noted  . Diabetes 12/22/2012  . Hypertension 12/22/2012  . OBESITY 07/08/2010  . OVARIAN CYST 07/08/2010    Past Medical History  Diagnosis Date  . Hypertension   . Diabetes mellitus without complication     Past Surgical History  Procedure Laterality Date  . Tonsillectomy    . Cholecystectomy    . Hernia repair      History  Substance Use Topics  . Smoking status: Never Smoker   . Smokeless tobacco: Not on file  . Alcohol Use: No    No family history on file.  Allergies  Allergen Reactions  . Penicillins     REACTION: rash    Medication list has been reviewed and updated.  Current Outpatient Prescriptions on File Prior to Visit  Medication Sig Dispense Refill  . folic acid (FOLVITE) 1 MG tablet Take 1 mg by mouth daily.      Marland Kitchen HYDROcodone-acetaminophen (NORCO/VICODIN) 5-325 MG per tablet Take 1 tablet by mouth every 4 (four) hours as needed for pain.  15 tablet  0  . Linagliptin-Metformin HCl (JENTADUETO) 2.5-850 MG TABS Take 1 tablet by mouth  2 (two) times daily.      Marland Kitchen triamcinolone cream (KENALOG) 0.1 % APPLY TO AFFECTED AREA TWICE A DAY AS NEEDED  45 g  0  . valsartan (DIOVAN) 320 MG tablet Take 320 mg by mouth daily.      . diclofenac (VOLTAREN) 75 MG EC tablet TAKE 1 TABLET BY MOUTH TWICE A DAY PRN  30 tablet  0  . oseltamivir (TAMIFLU) 75 MG capsule Take 1 capsule (75 mg total) by mouth daily.  10 capsule  0  . promethazine (PHENERGAN) 25 MG tablet Take 1 tablet (25 mg total) by mouth every 6 (six) hours as needed for nausea.  15 tablet  0   No current facility-administered medications on file prior to visit.    Review of Systems:  As per HPI- otherwise negative.   Physical Examination: Filed Vitals:   02/24/14 0942  BP: 132/74  Pulse: 74  Temp: 98.2 F (36.8 C)  Resp: 20   Filed Vitals:   02/24/14 0942  Height: 5\' 5"  (1.651 m)  Weight: 381 lb (172.82 kg)   Body mass index is 63.4 kg/(m^2). Ideal Body Weight: Weight in (lb) to have BMI = 25: 149.9  GEN: WDWN, NAD,  Non-toxic, A & O x 3, very obese HEENT: Atraumatic, Normocephalic. Neck supple. No masses, No LAD. Ears and Nose: No external deformity. CV: RRR, No M/G/R. No JVD. No thrill. No extra heart sounds. PULM: CTA B, no wheezes, crackles, rhonchi. No retractions. No resp. distress. No accessory muscle use. EXTR: No c/c/e NEURO Normal gait.  PSYCH: Normally interactive. Conversant. Not depressed or anxious appearing.  Calm demeanor.  She has a self- drained area on her left inner thigh.  There is mild induration around the hole, but no tenderness to suggest persistent pus.  No current facial rash  Assessment and Plan: Abscess of left leg - Plan: doxycycline (VIBRA-TABS) 100 MG tablet  Immunization due - Plan: Flu Vaccine QUAD 36+ mos IM  Facial rash - Plan: metroNIDAZOLE (METROGEL) 1 % gel  Flu shot today She has an abscess on her left thigh which drained by itself already.  Will treat with doxycycline.  It sounds as though she may have mild  rosacea.  Will use some metrogel instead of triamcinolone for maintenance as this will be safer for use on her face  See patient instructions for more details.     Signed Lamar Blinks, MD

## 2014-08-10 ENCOUNTER — Encounter: Payer: Self-pay | Admitting: Gynecologic Oncology

## 2014-08-10 ENCOUNTER — Ambulatory Visit: Payer: BC Managed Care – PPO | Attending: Gynecologic Oncology | Admitting: Gynecologic Oncology

## 2014-08-10 VITALS — BP 163/78 | HR 93 | Temp 98.3°F | Resp 20 | Ht 65.0 in | Wt 369.7 lb

## 2014-08-10 DIAGNOSIS — Z6841 Body Mass Index (BMI) 40.0 and over, adult: Secondary | ICD-10-CM

## 2014-08-10 DIAGNOSIS — D252 Subserosal leiomyoma of uterus: Secondary | ICD-10-CM | POA: Diagnosis not present

## 2014-08-10 DIAGNOSIS — R740 Nonspecific elevation of levels of transaminase and lactic acid dehydrogenase [LDH]: Secondary | ICD-10-CM | POA: Insufficient documentation

## 2014-08-10 NOTE — Patient Instructions (Addendum)
Once the MRI results are back, we will give you a call with the results. Please call us with any further questions or concerns.   You will need to call [phone] to attend a seminar about bariatric surgery and you will be scheduled after that.

## 2014-08-10 NOTE — Progress Notes (Signed)
Checked in new patient . She has ins card copy,facesheet and armband sheet.

## 2014-08-10 NOTE — Progress Notes (Signed)
Consult Note: Gyn-Onc  Consult was requested by Dr. Garwin Brothers for the evaluation of Annalaura Sauseda 54 y.o. female with a 7cm uterine fibroid and elevated LDH.  CC:  Chief Complaint  Patient presents with  . subserous leiomyoma of uterus    elevated LDH    Assessment/Plan:  Ms. Semira Stoltzfus  is a 54 y.o.  year old with slowly enlarging uterine fibroids and concern for possible occult LMS given mild elevation in the fraction 5 of her LDH level (the total LDH was normal)  We will obtain an MRI of the pelvis to better discriminate the nature of the fibroid and concern for LMS.  I have a low suspicion for occult LMS given the normal value of total LDH, the asymptomatic nature of her fibroids (which is almost always a benign finding) and the subtle (<2cm) change in size of fibroids over a 4 year period. Her severe obesity makes her an extremely poor candidate for elective surgery and I do not recommend hysterectomy for the purpose of definitively ruling out LMS (this would be the only definitive diagnostic method) as the risk of major comorbidity or complication in [place] far outweighs the risk or liklihood that leiomyosarcoma is present.  I had a lengthy discussion with Mishell regarding her morbid obesity and discussed that this is the major life limiting condition she faces. We discussed methods to lose weight and explored her interest in learning about bariatric surgery. I have made the referral for her to meet with Dr Hassell Done for [facility] to discuss this option further for her.  We will followup the results of the MRI and, if findings are not overly concerning for LMS, I would not recommend continued imaging of these fibroids if they remain asymptomatic.  HPI: Cleta Heatley is a 54 year old G2P2 who is seen in consultation at the request of Dr Garwin Brothers for an enlarging uterine fibroid and mildly elevated LDH. She is morbidly obese (class III) with a BMI of  61kg/m2. She has a history of an endometrial ablation in 2009 with benign pathology at that time. She had intermittent vaginal spotting (approximately once or twice per year) after her ablation. She is uncertain regarding menopausal symptoms. The patient had a known uterine fibroid (approximately 5.5cm) in 2012. She is asymptomatic. On routine annual gynecologic examination (bimanual exam) this year in March, 2016 she was, per patient, felt to have a change in exam, and therefore a repeat US was ordered. This showed normal ovaries, an endometrium measuring 61mm and an enlarged fibroid uterus (7.1cm now from 5.6cm). Dr Garwin Brothers ordered serum LDH as she was concerned for the possiblity of leiomyosarcoma given the increase in size of the fibroid over 4 years. The LDH on 07/08/14 was 209 IU/L (normal range is 119-226). However, the LD Fraction 5 was mildly elevated at 39% ( normal range is 4-20%).  The patient's last episode of vaginal spotting was >1 year ago. She denies abnormal discharge or bleeding. She denies pelvic pain or pressure. She denies change in bladder or bowel symptoms. She denies cough. She is morbidly obese with some interest in pursuing bariatric surgery. She has associated medical comorbidities with diabetes, hypertension, hyperlipidemia. She has discontinued seeing her prior PCP as she states they would not refer her for bariatric surgery consultation. She has not attempted other methods to lose weight.   Her prior abdominal surgeries include a ventral hernia repair with mesh and two prior cesarean sections with a tubal ligation at the time of  the second (1995).  Interval History: No vaginal bleeding. Fibroids remain asymptomatic.  Current Meds:  Outpatient Encounter Prescriptions as of 08/10/2014  Medication Sig  . amLODipine (NORVASC) 10 MG tablet Take 10 mg by mouth daily.  . B-D UF III MINI PEN NEEDLES 31G X 5 MM MISC   . folic acid (FOLVITE) 1 MG tablet Take 1 mg by mouth daily.  Marland Kitchen  LEVEMIR FLEXTOUCH 100 UNIT/ML Pen   . metFORMIN (GLUCOPHAGE-XR) 750 MG 24 hr tablet Take 750 mg by mouth daily with breakfast.   . NOVOLOG FLEXPEN 100 UNIT/ML FlexPen   . TRADJENTA 5 MG TABS tablet Take 5 mg by mouth daily.   . valsartan (DIOVAN) 320 MG tablet Take 320 mg by mouth daily.  Marland Kitchen doxycycline (VIBRA-TABS) 100 MG tablet Take 1 tablet (100 mg total) by mouth 2 (two) times daily. (Patient not taking: Reported on 08/10/2014)  . metroNIDAZOLE (METROGEL) 1 % gel Apply topically daily. Use as needed on face (Patient not taking: Reported on 08/10/2014)  . [DISCONTINUED] diclofenac (VOLTAREN) 75 MG EC tablet TAKE 1 TABLET BY MOUTH TWICE A DAY PRN  . [DISCONTINUED] HYDROcodone-acetaminophen (NORCO/VICODIN) 5-325 MG per tablet Take 1 tablet by mouth every 4 (four) hours as needed for pain. (Patient not taking: Reported on 08/10/2014)  . [DISCONTINUED] Linagliptin-Metformin HCl (JENTADUETO) 2.5-850 MG TABS Take 1 tablet by mouth 2 (two) times daily.  . [DISCONTINUED] triamcinolone cream (KENALOG) 0.1 % APPLY TO AFFECTED AREA TWICE A DAY AS NEEDED (Patient not taking: Reported on 08/10/2014)  . [DISCONTINUED] valsartan-hydrochlorothiazide (DIOVAN-HCT) 320-25 MG per tablet     Allergy:  Allergies  Allergen Reactions  . Penicillins     REACTION: rash    Social Hx:   History   Social History  . Marital Status: Married    Spouse Name: N/A  . Number of Children: N/A  . Years of Education: N/A   Occupational History  . Not on file.   Social History Main Topics  . Smoking status: Never Smoker   . Smokeless tobacco: Not on file  . Alcohol Use: No  . Drug Use: No  . Sexual Activity: Yes   Other Topics Concern  . Not on file   Social History Narrative    Past Surgical Hx:  Past Surgical History  Procedure Laterality Date  . Tonsillectomy    . Cholecystectomy    . Hernia repair      Past Medical Hx:  Past Medical History  Diagnosis Date  . Hypertension   . Diabetes mellitus  without complication     Past Gynecological History:  CS x 2. Abnormal pap (LGSIL with hr HPV positive in 2014. Pap in 2016 negative for dysplasia and negative for hr HPV). No LMP recorded. Patient is not currently having periods (Reason: Perimenopausal).  Family Hx: History reviewed. No pertinent family history.  Review of Systems:  Constitutional  Feels well,    ENT Normal appearing ears and nares bilaterally Skin/Breast  No rash, sores, jaundice, itching, dryness Cardiovascular  No chest pain, shortness of breath, or edema  Pulmonary  No cough or wheeze.  Gastro Intestinal  No nausea, vomitting, or diarrhoea. No bright red blood per rectum, no abdominal pain, change in bowel movement, or constipation.  Genito Urinary  No frequency, urgency, dysuria, see HPi Musculo Skeletal  No myalgia, arthralgia, joint swelling or pain  Neurologic  No weakness, numbness, change in gait,  Psychology  No depression, anxiety, insomnia.   Vitals:  Blood pressure 163/78, pulse  93, temperature 98.3 F (36.8 C), temperature source Oral, resp. rate 20, height 5\' 5"  (1.651 m), weight 369 lb 11.2 oz (167.695 kg).  Physical Exam: WD in NAD Neck  Supple NROM, without any enlargements.  Lymph Node Survey No cervical supraclavicular or inguinal adenopathy Cardiovascular  Pulse normal rate, regularity and rhythm. S1 and S2 normal.  Lungs  Clear to auscultation bilateraly, without wheezes/crackles/rhonchi. Good air movement.  Skin  No rash/lesions/breakdown  Psychiatry  Alert and oriented to person, place, and time  Abdomen  Normoactive bowel sounds, abdomen soft, non-tender and morbidly obese with a large pannus without evidence of hernia. Vertical upper abdominal incision from prior hernia repair. Back No CVA tenderness Genito Urinary  Vulva/vagina: Normal external female genitalia.  No lesions. No discharge or bleeding.  Bladder/urethra:  No lesions or masses, well supported  bladder  Vagina: no lesions  Cervix: Normal appearing, no lesions.  Uterus: Small, mobile, no parametrial involvement or nodularity.  Adnexa: no discernable masses though exam limited secondary to body habitus. Rectal  Good tone, no masses no cul de sac nodularity.  Extremities  No bilateral cyanosis, clubbing or edema.   Donaciano Eva, MD   08/10/2014, 12:35 PM

## 2014-09-01 ENCOUNTER — Other Ambulatory Visit: Payer: BC Managed Care – PPO

## 2014-09-10 ENCOUNTER — Ambulatory Visit
Admission: RE | Admit: 2014-09-10 | Discharge: 2014-09-10 | Disposition: A | Discharge status: Home / Self Care | Payer: BC Managed Care – PPO | Source: Ambulatory Visit | Attending: Gynecologic Oncology | Admitting: Gynecologic Oncology

## 2014-09-10 DIAGNOSIS — D252 Subserosal leiomyoma of uterus: Secondary | ICD-10-CM

## 2014-09-10 MED ORDER — GADOBENATE DIMEGLUMINE 529 MG/ML IV SOLN
20.0000 mL | Freq: Once | INTRAVENOUS | Status: AC | PRN
  Administered 2014-09-10: 20 mL via INTRAVENOUS

## 2014-09-21 ENCOUNTER — Telehealth: Payer: Self-pay | Admitting: *Deleted

## 2014-09-21 NOTE — Telephone Encounter (Signed)
Per Dr. Denman George, patient notified of MRI results and that it showed a benign fibroid.  Told patient that no further GYN ONC followup is needed and patient can return to Dr. Garwin Brothers. Patient appreciative of the call and states she will call call and schedule routine f/u with Dr. Garwin Brothers. No other concerns noted at this time. Told patient to please call us in the further with any questions or concerns.

## 2014-12-07 ENCOUNTER — Ambulatory Visit: Payer: BC Managed Care – PPO | Admitting: Podiatry

## 2014-12-16 ENCOUNTER — Other Ambulatory Visit: Payer: Self-pay | Admitting: Family Medicine

## 2014-12-16 ENCOUNTER — Ambulatory Visit
Admission: RE | Admit: 2014-12-16 | Discharge: 2014-12-16 | Disposition: A | Discharge status: Home / Self Care | Payer: BC Managed Care – PPO | Source: Ambulatory Visit | Attending: Family Medicine | Admitting: Family Medicine

## 2014-12-16 DIAGNOSIS — M25511 Pain in right shoulder: Secondary | ICD-10-CM

## 2014-12-16 DIAGNOSIS — M25561 Pain in right knee: Secondary | ICD-10-CM

## 2016-05-05 ENCOUNTER — Ambulatory Visit
Admission: RE | Admit: 2016-05-05 | Discharge: 2016-05-05 | Disposition: A | Discharge status: Home / Self Care | Payer: BC Managed Care – PPO | Source: Ambulatory Visit | Attending: Family Medicine | Admitting: Family Medicine

## 2016-05-05 ENCOUNTER — Other Ambulatory Visit: Payer: Self-pay | Admitting: Family Medicine

## 2016-05-05 DIAGNOSIS — M25551 Pain in right hip: Secondary | ICD-10-CM

## 2016-05-05 DIAGNOSIS — M25552 Pain in left hip: Principal | ICD-10-CM

## 2016-05-31 ENCOUNTER — Ambulatory Visit (INDEPENDENT_AMBULATORY_CARE_PROVIDER_SITE_OTHER): Payer: BC Managed Care – PPO | Admitting: Physician Assistant

## 2016-05-31 VITALS — BP 120/64 | HR 104 | Temp 99.4°F | Ht 65.0 in | Wt 382.2 lb

## 2016-05-31 DIAGNOSIS — R062 Wheezing: Secondary | ICD-10-CM

## 2016-05-31 DIAGNOSIS — R05 Cough: Secondary | ICD-10-CM

## 2016-05-31 DIAGNOSIS — R059 Cough, unspecified: Secondary | ICD-10-CM

## 2016-05-31 DIAGNOSIS — J209 Acute bronchitis, unspecified: Secondary | ICD-10-CM | POA: Diagnosis not present

## 2016-05-31 MED ORDER — FLUTICASONE PROPIONATE 50 MCG/ACT NA SUSP: 2.0000 | Freq: Every day | NASAL | 6 refills | Status: DC

## 2016-05-31 MED ORDER — AZITHROMYCIN 250 MG PO TABS: ORAL_TABLET | ORAL | 0 refills | Status: DC

## 2016-05-31 MED ORDER — BENZONATATE 100 MG PO CAPS: 100.0000 mg | ORAL_CAPSULE | Freq: Three times a day (TID) | ORAL | 0 refills | Status: DC | PRN

## 2016-05-31 NOTE — Patient Instructions (Addendum)
Flonase: 2 sprays each nostril at night before bed and in the morning.  Tessalon for daytime cough. Take this as directed. Please stay well hydrated. Drink 1-3 liters of water a day. Warm tea with honey is good for sore throat, as well as warm salt water gargles.  Return to clinic if you are not better in 5-7 days.    Thank you for coming in today. I hope you feel we met your needs.  Feel free to call [facility] if you have any questions or further requests.  Please consider signing up for MyChart if you do not already have it, as this is a great way to communicate with me.  Best,  Whitney McVey, PA-C  IF you received an x-ray today, you will receive an invoice from [facility]. Please contact [facility] at [phone] with questions or concerns regarding your invoice.   IF you received labwork today, you will receive an invoice from [place]. Please contact [facility] at [phone] with questions or concerns regarding your invoice.   Our billing staff will not be able to assist you with questions regarding bills from these companies.  You will be contacted with the lab results as soon as they are available. The fastest way to get your results is to activate your My Chart account. Instructions are located on the last page of this paperwork. If you have not heard from [place] regarding the results in 2 weeks, please contact this office.

## 2016-05-31 NOTE — Progress Notes (Signed)
Natasha Rollins  MRN: [id number] DOB: 12-29-1960  PCP: Marvene Staff, MD  Subjective:  Pt is a 56 year old female PMH HTN, diabetes, morbid obesity who presents to clinic for sore throat and cough. Symptoms started about 5 days ago with cough. Non-productive. +sore throat from coughing. Chest pain when coughing. Cough is present all the time, keeping her up at night. +wheezing, short of breath, chest congestion, ear fullness.   Denies fever, chills, runny nose, sneezing, chest tightness, headache.  She has tried Mucinex for cough.   Review of Systems  Constitutional: Negative for chills, diaphoresis, fatigue and fever.  HENT: Positive for congestion and sore throat. Negative for postnasal drip, rhinorrhea, sinus pressure and sneezing.   Respiratory: Positive for cough, shortness of breath and wheezing. Negative for chest tightness.   Cardiovascular: Negative for chest pain and palpitations.  Gastrointestinal: Negative for abdominal pain, diarrhea, nausea and vomiting.  Neurological: Negative for weakness, light-headedness and headaches.  Psychiatric/Behavioral: Positive for sleep disturbance.    Patient Active Problem List   Diagnosis Date Noted  . Diabetes ([place]) 12/22/2012  . Hypertension 12/22/2012  . Morbid obesity with BMI of 60.0-69.9, adult ([place]) 07/08/2010  . OVARIAN CYST 07/08/2010    Current Outpatient Prescriptions on File Prior to Visit  Medication Sig Dispense Refill  . amLODipine (NORVASC) 10 MG tablet Take 10 mg by mouth daily.  8  . B-D UF III MINI PEN NEEDLES 31G X 5 MM MISC     . folic acid (FOLVITE) 1 MG tablet Take 1 mg by mouth daily.    Marland Kitchen LEVEMIR FLEXTOUCH 100 UNIT/ML Pen     . metroNIDAZOLE (METROGEL) 1 % gel Apply topically daily. Use as needed on face 45 g 0  . NOVOLOG FLEXPEN 100 UNIT/ML FlexPen     . TRADJENTA 5 MG TABS tablet Take 5 mg by mouth daily.   5  . valsartan (DIOVAN) 320 MG tablet Take 320 mg by mouth daily.    Marland Kitchen doxycycline  (VIBRA-TABS) 100 MG tablet Take 1 tablet (100 mg total) by mouth 2 (two) times daily. (Patient not taking: Reported on 05/31/2016) 20 tablet 0  . metFORMIN (GLUCOPHAGE-XR) 750 MG 24 hr tablet Take 750 mg by mouth daily with breakfast.   6   No current facility-administered medications on file prior to visit.     Allergies  Allergen Reactions  . Penicillins     REACTION: rash     Objective:  BP 120/64 (BP Location: Left Arm, Patient Position: Sitting, Cuff Size: Large)   Pulse (!) 104   Temp 99.4 F (37.4 C) (Oral)   Ht 5\' 5"  (1.651 m)   Wt (!) 382 lb 3.2 oz (173.4 kg)   SpO2 96%   BMI 63.60 kg/m   Physical Exam  Constitutional: She is oriented to person, place, and time and well-developed, well-nourished, and in no distress. No distress.  HENT:  Right Ear: Tympanic membrane is bulging.  Left Ear: Tympanic membrane normal.  Nose: Mucosal edema and rhinorrhea present. Right sinus exhibits no maxillary sinus tenderness and no frontal sinus tenderness. Left sinus exhibits no maxillary sinus tenderness and no frontal sinus tenderness.  Cardiovascular: Normal rate, regular rhythm and normal heart sounds.   Pulmonary/Chest: Effort normal. She has wheezes in the right upper field and the left upper field. She has rhonchi in the right middle field.  Neurological: She is alert and oriented to person, place, and time. GCS score is 15.  Skin: Skin is warm and  dry.  Psychiatric: Mood, memory, affect and judgment normal.  Vitals reviewed.   Assessment and Plan :  1. Acute bronchitis, unspecified organism 2. Cough 3. Wheezing - benzonatate (TESSALON) 100 MG capsule; Take 1-2 capsules (100-200 mg total) by mouth 3 (three) times daily as needed for cough.  Dispense: 40 capsule; Refill: 0 - azithromycin (ZITHROMAX) 250 MG tablet; Take 2 tabs PO x 1 dose, then 1 tab PO QD x 4 days  Dispense: 6 tablet; Refill: 0 - fluticasone (FLONASE) 50 MCG/ACT nasal spray; Place 2 sprays into both nostrils  daily.  Dispense: 16 g; Refill: 6 - Supportive care: push fluids, rest. RTC in 5-7 days if no improvement.   Mercer Pod, PA-C  [facility] 05/31/2016 1:55 PM

## 2016-06-01 ENCOUNTER — Telehealth: Payer: Self-pay

## 2016-06-01 NOTE — Telephone Encounter (Signed)
PATIENT STATES SHE SAW Natasha Rollins Wednesday FOR A SORE THROAT AND COUGH. SHE SAID SHE RECOMMENDED SHE START USING AND INHALER. THE PATIENT SAID SHE DID NOT THINK SHE NEEDED ONE SO SHE DECLINED IT. SHE HAS REALIZED NOW THAT SHE DOES NEED ONE AND WOULD LIKE TO HAVE IT CALLED INTO HER PHARMACY FOR THE WHEEZING. BEST PHONE [phone] (CELL) PHARMACY CHOICE IS [facility] ON [address].  [place]

## 2016-06-02 NOTE — Telephone Encounter (Signed)
Pt wanting a refill on the inhaler that Timmothy Euler was going to put her on shes wheezing really bad

## 2016-06-02 NOTE — Telephone Encounter (Signed)
She was seen 2 days ago, I dont see an inhaler mentioned?

## 2016-06-03 ENCOUNTER — Ambulatory Visit (INDEPENDENT_AMBULATORY_CARE_PROVIDER_SITE_OTHER): Payer: BC Managed Care – PPO | Admitting: Emergency Medicine

## 2016-06-03 ENCOUNTER — Ambulatory Visit (INDEPENDENT_AMBULATORY_CARE_PROVIDER_SITE_OTHER): Payer: BC Managed Care – PPO

## 2016-06-03 VITALS — BP 138/84 | HR 116 | Temp 98.9°F | Resp 18 | Ht 64.5 in | Wt 375.0 lb

## 2016-06-03 DIAGNOSIS — J209 Acute bronchitis, unspecified: Secondary | ICD-10-CM

## 2016-06-03 DIAGNOSIS — R05 Cough: Secondary | ICD-10-CM | POA: Diagnosis not present

## 2016-06-03 DIAGNOSIS — J111 Influenza due to unidentified influenza virus with other respiratory manifestations: Secondary | ICD-10-CM

## 2016-06-03 DIAGNOSIS — R059 Cough, unspecified: Secondary | ICD-10-CM

## 2016-06-03 LAB — POCT INFLUENZA A/B
Influenza A, POC: NEGATIVE
Influenza B, POC: POSITIVE — AB

## 2016-06-03 MED ORDER — OSELTAMIVIR PHOSPHATE 75 MG PO CAPS: 75.0000 mg | ORAL_CAPSULE | Freq: Two times a day (BID) | ORAL | 0 refills | Status: AC

## 2016-06-03 MED ORDER — PROMETHAZINE-CODEINE 6.25-10 MG/5ML PO SYRP: 5.0000 mL | ORAL_SOLUTION | Freq: Every evening | ORAL | 0 refills | Status: AC | PRN

## 2016-06-03 NOTE — Patient Instructions (Addendum)
     IF you received an x-ray today, you will receive an invoice from [facility]. Please contact [facility] at [phone] with questions or concerns regarding your invoice.   IF you received labwork today, you will receive an invoice from [facility]. Please contact [facility] at [phone] with questions or concerns regarding your invoice.   Our billing staff will not be able to assist you with questions regarding bills from these companies.  You will be contacted with the lab results as soon as they are available. The fastest way to get your results is to activate your My Chart account. Instructions are located on the last page of this paperwork. If you have not heard from us regarding the results in 2 weeks, please contact this office.      Influenza, Adult Influenza ("the flu") is an infection in the lungs, nose, and throat (respiratory tract). It is caused by a virus. The flu causes many common cold symptoms, as well as a high fever and body aches. It can make you feel very sick. The flu spreads easily from person to person (is contagious). Getting a flu shot (influenza vaccination) every year is the best way to prevent the flu. Follow these instructions at home:  Take over-the-counter and prescription medicines only as told by your doctor.  Use a cool mist humidifier to add moisture (humidity) to the air in your home. This can make it easier to breathe.  Rest as needed.  Drink enough fluid to keep your pee (urine) clear or pale yellow.  Cover your mouth and nose when you cough or sneeze.  Wash your hands with soap and water often, especially after you cough or sneeze. If you cannot use soap and water, use hand sanitizer.  Stay home from work or school as told by your doctor. Unless you are visiting your doctor, try to avoid leaving home until your fever has been gone for 24 hours without the use of medicine.  Keep all follow-up visits as told by your doctor.  This is important. How is this prevented?  Getting a yearly (annual) flu shot is the best way to avoid getting the flu. You may get the flu shot in late summer, fall, or winter. Ask your doctor when you should get your flu shot.  Wash your hands often or use hand sanitizer often.  Avoid contact with people who are sick during cold and flu season.  Eat healthy foods.  Drink plenty of fluids.  Get enough sleep.  Exercise regularly. Contact a doctor if:  You get new symptoms.  You have:  Chest pain.  Watery poop (diarrhea).  A fever.  Your cough gets worse.  You start to have more mucus.  You feel sick to your stomach (nauseous).  You throw up (vomit). Get help right away if:  You start to be short of breath or have trouble breathing.  Your skin or nails turn a bluish color.  You have very bad pain or stiffness in your neck.  You get a sudden headache.  You get sudden pain in your face or ear.  You cannot stop throwing up. This information is not intended to replace advice given to you by your health care provider. Make sure you discuss any questions you have with your health care provider. Document Released: 02/01/2008 Document Revised: 09/30/2015 Document Reviewed: 02/16/2015 Elsevier Interactive Patient Education  2017 Elsevier Inc.  

## 2016-06-03 NOTE — Progress Notes (Signed)
Natasha Rollins 56 y.o.   Chief Complaint  Patient presents with  . Cough    HISTORY OF PRESENT ILLNESS: This is a 56 y.o. female complaining of flu-like symptoms x 2-3 weeks; son also sick; seen here several days ago and started on Z-pak, Flonase, and Tessalon. No significant improvement; still feels sick. Got very sick yesterday.  Cough  This is a new problem. The current episode started 1 to 4 weeks ago. The problem has been unchanged. The problem occurs hourly. The cough is productive of sputum. Associated symptoms include ear congestion and nasal congestion. Pertinent negatives include no chest pain, chills, ear pain, eye redness, fever, headaches, heartburn, hemoptysis, myalgias, rash, rhinorrhea, sore throat, shortness of breath or wheezing. There is no history of asthma, bronchitis, COPD, emphysema or pneumonia.     Prior to Admission medications   Medication Sig Start Date End Date Taking? Authorizing Provider  amLODipine (NORVASC) 10 MG tablet Take 10 mg by mouth daily. 06/07/14  Yes Historical Provider, MD  azithromycin (ZITHROMAX) 250 MG tablet Take 2 tabs PO x 1 dose, then 1 tab PO QD x 4 days 05/31/16  Yes [employer] McVey, PA-C  B-D UF III MINI PEN NEEDLES 31G X 5 MM MISC  07/28/14  Yes Historical Provider, MD  benzonatate (TESSALON) 100 MG capsule Take 1-2 capsules (100-200 mg total) by mouth 3 (three) times daily as needed for cough. 05/31/16  Yes Elizabeth Whitney McVey, PA-C  doxycycline (VIBRA-TABS) 100 MG tablet Take 1 tablet (100 mg total) by mouth 2 (two) times daily. 02/24/14  Yes Jessica C Copland, MD  fluticasone (FLONASE) 50 MCG/ACT nasal spray Place 2 sprays into both nostrils daily. 05/31/16  Yes Elizabeth Whitney McVey, PA-C  folic acid (FOLVITE) 1 MG tablet Take 1 mg by mouth daily.   Yes Historical Provider, MD  LEVEMIR FLEXTOUCH 100 UNIT/ML Pen  07/28/14  Yes Historical Provider, MD  metFORMIN (GLUCOPHAGE-XR) 750 MG 24 hr tablet Take 750 mg by mouth  daily with breakfast.  06/07/14  Yes Historical Provider, MD  metroNIDAZOLE (METROGEL) 1 % gel Apply topically daily. Use as needed on face 02/24/14  Yes Gay Filler Copland, MD  NOVOLOG FLEXPEN 100 UNIT/ML FlexPen  07/28/14  Yes Historical Provider, MD  TRADJENTA 5 MG TABS tablet Take 5 mg by mouth daily.  07/10/14  Yes Historical Provider, MD  valsartan (DIOVAN) 320 MG tablet Take 320 mg by mouth daily.   Yes Historical Provider, MD  acetaminophen-codeine (TYLENOL #3) 300-30 MG tablet  05/07/16   Historical Provider, MD    Allergies  Allergen Reactions  . Penicillins     REACTION: rash    Patient Active Problem List   Diagnosis Date Noted  . Diabetes ([place]) 12/22/2012  . Hypertension 12/22/2012  . Morbid obesity with BMI of 60.0-69.9, adult ([place]) 07/08/2010  . OVARIAN CYST 07/08/2010    Past Medical History:  Diagnosis Date  . Diabetes mellitus without complication ([place])   . Hypertension     Past Surgical History:  Procedure Laterality Date  . CHOLECYSTECTOMY    . HERNIA REPAIR    . TONSILLECTOMY      Social History   Social History  . Marital status: Married    Spouse name: N/A  . Number of children: N/A  . Years of education: N/A   Occupational History  . Not on file.   Social History Main Topics  . Smoking status: Never Smoker  . Smokeless tobacco: Never Used  . Alcohol use No  .  Drug use: No  . Sexual activity: Yes   Other Topics Concern  . Not on file   Social History Narrative  . No narrative on file    No family history on file.   Review of Systems  Constitutional: Negative for chills and fever.  HENT: Negative for ear pain, rhinorrhea and sore throat.   Eyes: Negative for discharge and redness.  Respiratory: Positive for cough. Negative for hemoptysis, shortness of breath and wheezing.   Cardiovascular: Negative for chest pain.  Gastrointestinal: Negative for abdominal pain, heartburn, nausea and vomiting.  Genitourinary: Negative for dysuria  and hematuria.  Musculoskeletal: Negative for myalgias.  Skin: Negative for rash.  Neurological: Negative for dizziness and headaches.  Endo/Heme/Allergies: Negative.   All other systems reviewed and are negative.  Vitals:   06/03/16 1437  BP: 138/84  Pulse: (!) 116  Resp: 18  Temp: 98.9 F (37.2 C)     Physical Exam  Constitutional: She is oriented to person, place, and time. She appears well-developed.  Morbidly obese.  HENT:  Head: Normocephalic and atraumatic.  Right Ear: External ear normal.  Left Ear: External ear normal.  Nose: Nose normal.  Mouth/Throat: Oropharynx is clear and moist. No oropharyngeal exudate.  Eyes: Conjunctivae and EOM are normal. Pupils are equal, round, and reactive to light.  Neck: Normal range of motion. Neck supple. No JVD present. No thyromegaly present.  Cardiovascular: Normal rate, regular rhythm and normal heart sounds.   Pulmonary/Chest: Effort normal. No respiratory distress. She has no wheezes. She has no rales.  Abdominal: Soft. Bowel sounds are normal. She exhibits no distension. There is no tenderness.  Musculoskeletal: Normal range of motion.  Lymphadenopathy:    She has no cervical adenopathy.  Neurological: She is alert and oriented to person, place, and time.  Skin: Skin is warm and dry. Capillary refill takes less than 2 seconds.  Psychiatric: She has a normal mood and affect. Her behavior is normal.  Vitals reviewed.    ASSESSMENT & PLAN:  Scherry was seen today for cough.  Diagnoses and all orders for this visit:  Influenza with respiratory manifestation other than pneumonia  Acute bronchitis, unspecified organism -     POCT Influenza A/B -     DG Chest 2 View; Future  Cough  Other orders -     oseltamivir (TAMIFLU) 75 MG capsule; Take 1 capsule (75 mg total) by mouth 2 (two) times daily. -     promethazine-codeine (PHENERGAN WITH CODEINE) 6.25-10 MG/5ML syrup; Take 5 mLs by mouth at bedtime as needed for  cough.    Patient Instructions       IF you received an x-ray today, you will receive an invoice from [facility]. Please contact [facility] at [phone] with questions or concerns regarding your invoice.   IF you received labwork today, you will receive an invoice from [facility]. Please contact [facility] at [phone] with questions or concerns regarding your invoice.   Our billing staff will not be able to assist you with questions regarding bills from these companies.  You will be contacted with the lab results as soon as they are available. The fastest way to get your results is to activate your My Chart account. Instructions are located on the last page of this paperwork. If you have not heard from [place] regarding the results in 2 weeks, please contact this office.     Influenza, Adult Influenza ("the flu") is an infection in the lungs, nose, and throat (respiratory  tract). It is caused by a virus. The flu causes many common cold symptoms, as well as a high fever and body aches. It can make you feel very sick. The flu spreads easily from person to person (is contagious). Getting a flu shot (influenza vaccination) every year is the best way to prevent the flu. Follow these instructions at home:  Take over-the-counter and prescription medicines only as told by your doctor.  Use a cool mist humidifier to add moisture (humidity) to the air in your home. This can make it easier to breathe.  Rest as needed.  Drink enough fluid to keep your pee (urine) clear or pale yellow.  Cover your mouth and nose when you cough or sneeze.  Wash your hands with soap and water often, especially after you cough or sneeze. If you cannot use soap and water, use hand sanitizer.  Stay home from work or school as told by your doctor. Unless you are visiting your doctor, try to avoid leaving home until your fever has been gone for 24 hours without the use of medicine.  Keep all  follow-up visits as told by your doctor. This is important. How is this prevented?  Getting a yearly (annual) flu shot is the best way to avoid getting the flu. You may get the flu shot in late summer, fall, or winter. Ask your doctor when you should get your flu shot.  Wash your hands often or use hand sanitizer often.  Avoid contact with people who are sick during cold and flu season.  Eat healthy foods.  Drink plenty of fluids.  Get enough sleep.  Exercise regularly. Contact a doctor if:  You get new symptoms.  You have:  Chest pain.  Watery poop (diarrhea).  A fever.  Your cough gets worse.  You start to have more mucus.  You feel sick to your stomach (nauseous).  You throw up (vomit). Get help right away if:  You start to be short of breath or have trouble breathing.  Your skin or nails turn a bluish color.  You have very bad pain or stiffness in your neck.  You get a sudden headache.  You get sudden pain in your face or ear.  You cannot stop throwing up. This information is not intended to replace advice given to you by your health care provider. Make sure you discuss any questions you have with your health care provider. Document Released: 02/01/2008 Document Revised: 09/30/2015 Document Reviewed: 02/16/2015 Elsevier Interactive Patient Education  2017 Elsevier Inc.     Agustina Caroli, MD [facility]

## 2016-06-03 NOTE — Telephone Encounter (Signed)
She decline Rx for inhaler at her last OV, as she was not wheezing at the time. If she is wheezing bad, she needs to be seen. Please call and advise she come in. Thank you.

## 2016-06-05 NOTE — Telephone Encounter (Signed)
Spoke with pt. Pt was advised that if she is still wheezing, she needs to make an appointment and be seen in order to get an inhaler. Pt stated that she would "see how she feels today" and then make a decision about making an appointment.

## 2016-06-19 ENCOUNTER — Ambulatory Visit (INDEPENDENT_AMBULATORY_CARE_PROVIDER_SITE_OTHER): Payer: BC Managed Care – PPO

## 2016-06-19 ENCOUNTER — Ambulatory Visit (INDEPENDENT_AMBULATORY_CARE_PROVIDER_SITE_OTHER): Payer: BC Managed Care – PPO | Admitting: Family Medicine

## 2016-06-19 VITALS — BP 128/70 | HR 115 | Temp 98.8°F | Resp 20

## 2016-06-19 DIAGNOSIS — R05 Cough: Secondary | ICD-10-CM

## 2016-06-19 DIAGNOSIS — R0789 Other chest pain: Secondary | ICD-10-CM

## 2016-06-19 DIAGNOSIS — R059 Cough, unspecified: Secondary | ICD-10-CM

## 2016-06-19 DIAGNOSIS — J209 Acute bronchitis, unspecified: Secondary | ICD-10-CM | POA: Diagnosis not present

## 2016-06-19 DIAGNOSIS — I2699 Other pulmonary embolism without acute cor pulmonale: Secondary | ICD-10-CM | POA: Diagnosis not present

## 2016-06-19 DIAGNOSIS — R000 Tachycardia, unspecified: Secondary | ICD-10-CM

## 2016-06-19 DIAGNOSIS — Z8709 Personal history of other diseases of the respiratory system: Secondary | ICD-10-CM | POA: Diagnosis not present

## 2016-06-19 DIAGNOSIS — R0600 Dyspnea, unspecified: Secondary | ICD-10-CM | POA: Diagnosis not present

## 2016-06-19 LAB — POCT CBC
GRANULOCYTE PERCENT: 68.3 % (ref 37–80)
HCT, POC: 35.3 % — AB (ref 37.7–47.9)
Hemoglobin: 12.7 g/dL (ref 12.2–16.2)
Lymph, poc: 3.2 (ref 0.6–3.4)
MCH, POC: 30.5 pg (ref 27–31.2)
MCHC: 36 g/dL — AB (ref 31.8–35.4)
MCV: 84.6 fL (ref 80–97)
MID (cbc): 1.3 — AB (ref 0–0.9)
MPV: 7.4 fL (ref 0–99.8)
POC GRANULOCYTE: 9.6 — AB (ref 2–6.9)
POC LYMPH %: 22.5 % (ref 10–50)
POC MID %: 9.2 %M (ref 0–12)
Platelet Count, POC: 347 10*3/uL (ref 142–424)
RBC: 4.18 M/uL (ref 4.04–5.48)
RDW, POC: 16 %
WBC: 14.1 10*3/uL — AB (ref 4.6–10.2)

## 2016-06-19 MED ORDER — BENZONATATE 100 MG PO CAPS: 100.0000 mg | ORAL_CAPSULE | Freq: Three times a day (TID) | ORAL | 0 refills | Status: DC | PRN

## 2016-06-19 MED ORDER — LEVOFLOXACIN 500 MG PO TABS: 500.0000 mg | ORAL_TABLET | Freq: Every day | ORAL | 0 refills | Status: DC

## 2016-06-19 NOTE — Progress Notes (Signed)
By signing my name below, I, Natasha Rollins, attest that this documentation has been prepared under the direction and in the presence of Natasha Ray, MD.  Electronically Signed: Verlee Rollins, Medical Scribe. 06/19/16. 5:22 PM.  Subjective:    Patient ID: Natasha Rollins, female    DOB: [birth year], 56 y.o.   MRN: [id number]  HPI Chief Complaint  Patient presents with   Follow-up    still sick, chest cough and sob    HPI Comments: Natasha Rollins is a 56 y.o. female who presents to the [facility] for follow-up. Natasha Rollins was seen here Jan 27th, after initial visit on the 24th she had cough sore throat, wheezing, suspected acute bronchitis. She was treated with tessalon, z-pak, flonase, temp 99.4 at that time. A. febrile on follow-up visit on the 27th but tachycardic at 116. Still symptomatic with cough, more productive of sputum. Tested positive for influenza B, was treated with tamiflu and phenergan with codeine cough syrup. CXR without acute findings. 2 prior notes were reviewed.  Pt reports trouble breathing, frequent dry cough, some wheezing, chest soreness with cough, ear discomfort, and chest congestion -  fever went away 06/13/15. Pt was compliant with her rx medication and the codeine didn't relieve her of her sxs. She also takes mucinex-DM QHS without relief of her lingering sxs. She notes her cough is worse later in the day. Pt didn't take the inhaler because she was scared of getting asthma - her children have asthma and 2 relatives died of asthma. Pt has been drinking 1 cup of coffee a day when she usually doesn't drink coffee. Denies recent surgeries, travel, calf pain or swelling, GERD, and postnasal drainage.  Patient Active Problem List   Diagnosis Date Noted   Diabetes ([place]) 12/22/2012   Hypertension 12/22/2012   Morbid obesity with BMI of 60.0-69.9, adult ([place]) 07/08/2010   OVARIAN CYST 07/08/2010   Past Medical History:  Diagnosis  Date   Diabetes mellitus without complication ([place])    Hypertension    Past Surgical History:  Procedure Laterality Date   CHOLECYSTECTOMY     HERNIA REPAIR     TONSILLECTOMY     Allergies  Allergen Reactions   Penicillins     REACTION: rash   Prior to Admission medications   Medication Sig Start Date End Date Taking? Authorizing Provider  acetaminophen-codeine (TYLENOL #3) 300-30 MG tablet  05/07/16  Yes Historical Provider, MD  amLODipine (NORVASC) 10 MG tablet Take 10 mg by mouth daily. 06/07/14  Yes Historical Provider, MD  azithromycin (ZITHROMAX) 250 MG tablet Take 2 tabs PO x 1 dose, then 1 tab PO QD x 4 days 05/31/16  Yes [employer] McVey, PA-C  B-D UF III MINI PEN NEEDLES 31G X 5 MM MISC  07/28/14  Yes Historical Provider, MD  benzonatate (TESSALON) 100 MG capsule Take 1-2 capsules (100-200 mg total) by mouth 3 (three) times daily as needed for cough. 05/31/16  Yes Elizabeth Whitney McVey, PA-C  doxycycline (VIBRA-TABS) 100 MG tablet Take 1 tablet (100 mg total) by mouth 2 (two) times daily. 02/24/14  Yes Jessica C Copland, MD  fluticasone (FLONASE) 50 MCG/ACT nasal spray Place 2 sprays into both nostrils daily. 05/31/16  Yes Elizabeth Whitney McVey, PA-C  folic acid (FOLVITE) 1 MG tablet Take 1 mg by mouth daily.   Yes Historical Provider, MD  LEVEMIR FLEXTOUCH 100 UNIT/ML Pen  07/28/14  Yes Historical Provider, MD  metFORMIN (GLUCOPHAGE-XR) 750 MG 24 hr tablet Take  750 mg by mouth daily with breakfast.  06/07/14  Yes Historical Provider, MD  metroNIDAZOLE (METROGEL) 1 % gel Apply topically daily. Use as needed on face 02/24/14  Yes Gay Filler Copland, MD  NOVOLOG FLEXPEN 100 UNIT/ML FlexPen  07/28/14  Yes Historical Provider, MD  TRADJENTA 5 MG TABS tablet Take 5 mg by mouth daily.  07/10/14  Yes Historical Provider, MD  valsartan (DIOVAN) 320 MG tablet Take 320 mg by mouth daily.   Yes Historical Provider, MD   Social History   Social History   Marital status:  Married    Spouse name: N/A   Number of children: N/A   Years of education: N/A   Occupational History   Not on file.   Social History Main Topics   Smoking status: Never Smoker   Smokeless tobacco: Never Used   Alcohol use No   Drug use: No   Sexual activity: Yes   Other Topics Concern   Not on file   Social History Narrative   No narrative on file   Review of Systems  HENT: Positive for congestion and ear pain. Negative for postnasal drip.   Respiratory: Positive for cough, shortness of breath and wheezing.   Cardiovascular: Positive for chest pain (soreness with cough). Negative for leg swelling.  Musculoskeletal: Negative for myalgias.   Objective:  Physical Exam  Constitutional: She is oriented to person, place, and time. She appears well-developed and well-nourished. No distress.  HENT:  Head: Normocephalic and atraumatic.  Right Ear: Hearing, tympanic membrane, external ear and ear canal normal.  Left Ear: Hearing, tympanic membrane, external ear and ear canal normal.  Nose: Nose normal.  Mouth/Throat: Oropharynx is clear and moist. No oropharyngeal exudate.  Eyes: Conjunctivae and EOM are normal. Pupils are equal, round, and reactive to light.  Neck: Neck supple.  Cardiovascular: Regular rhythm, normal heart sounds and intact distal pulses.  Tachycardia present.  Exam reveals no friction rub.   No murmur heard. Pulmonary/Chest: Effort normal and breath sounds normal. No respiratory distress. She has no wheezes. She has no rhonchi. She has no rales.  Neurological: She is alert and oriented to person, place, and time.  Skin: Skin is warm and dry. No rash noted.  Psychiatric: She has a normal mood and affect. Her behavior is normal.  Nursing note and vitals reviewed.  BP 128/70 (Cuff Size: Large)    Pulse (!) 115    Temp 98.8 F (37.1 C) (Oral)    Resp 20    SpO2 97%    [EKG Reading]: Sinus Tachycardia. Rate 109. Nonspecific T-wave V6, flat T-wave T5,  and lead iii.   Results for orders placed or performed in visit on 06/19/16  TSH  Result Value Ref Range   TSH 0.720 0.450 - 4.500 uIU/mL  D-dimer, quantitative (not at [facility])  Result Value Ref Range   D-DIMER 1.51 (H) 0.00 - 0.49 mg/L FEU  POCT CBC  Result Value Ref Range   WBC 14.1 (A) 4.6 - 10.2 K/uL   Lymph, poc 3.2 0.6 - 3.4   POC LYMPH PERCENT 22.5 10 - 50 %L   MID (cbc) 1.3 (A) 0 - 0.9   POC MID % 9.2 0 - 12 %M   POC Granulocyte 9.6 (A) 2 - 6.9   Granulocyte percent 68.3 37 - 80 %G   RBC 4.18 4.04 - 5.48 M/uL   Hemoglobin 12.7 12.2 - 16.2 g/dL   HCT, POC 35.3 (A) 37.7 - 47.9 %  MCV 84.6 80 - 97 fL   MCH, POC 30.5 27 - 31.2 pg   MCHC 36.0 (A) 31.8 - 35.4 g/dL   RDW, POC 16.0 %   Platelet Count, POC 347 142 - 424 K/uL   MPV 7.4 0 - 99.8 fL   Ct Angio Chest W/cm &/or Wo Cm  Result Date: 06/20/2016 CLINICAL DATA:  Subacute onset of shortness of breath. Elevated D-dimer. Initial encounter. EXAM: CT ANGIOGRAPHY CHEST WITH CONTRAST TECHNIQUE: Multidetector CT imaging of the chest was performed using the standard protocol during bolus administration of intravenous contrast. Multiplanar CT image reconstructions and MIPs were obtained to evaluate the vascular anatomy. CONTRAST:  100 mL of Omnipaque 300 IV contrast COMPARISON:  Chest radiograph performed 06/19/2016 FINDINGS: Cardiovascular: Pulmonary embolus is noted within the left main pulmonary artery, extending into the left upper and lower lobes. There is also pulmonary embolus within a segmental pulmonary artery branch to the right lower lobe. The RV/LV ratio is 0.85, without definite evidence for right heart strain. The heart is borderline normal in size. Mild calcification is noted along the aortic arch. The great vessels are grossly unremarkable in appearance. Mediastinum/Nodes: Visualized mediastinal nodes remain borderline normal in size. No pericardial effusion is identified. The thyroid gland is difficult to fully characterize,  but appears grossly unremarkable. No axillary lymphadenopathy is seen. Lungs/Pleura: Minimal bibasilar atelectasis is noted. No pleural effusion or pneumothorax is seen. No masses are identified. Upper Abdomen: The visualized portions of the liver and spleen are grossly unremarkable. A likely fat-containing left adrenal adenoma is partially characterized. Musculoskeletal: No acute osseous abnormalities are identified. The visualized musculature is unremarkable in appearance. Review of the MIP images confirms the above findings. IMPRESSION: 1. Pulmonary embolus within the left main pulmonary artery, extending into the left upper and lower lobes. Pulmonary embolus within a segmental pulmonary artery branch to the right lower lobe. RV/LV ratio of 0.85, without definite CT evidence for right heart strain at this time. 2. Minimal bibasilar atelectasis noted.  Lungs otherwise clear. 3. Likely fat-containing left adrenal adenoma partially characterized. Critical Value/emergent results were called by telephone at the time of interpretation on 06/20/2016 at 6:43 pm to Dr. Merri Rollins, who verbally acknowledged these results. Electronically Signed   By: Garald Balding M.D.   On: 06/20/2016 18:45      Assessment & Plan:    Lakeysia Knippel is a 56 y.o. female Cough - Plan: DG Chest 2 View, POCT CBC, levofloxacin (LEVAQUIN) 500 MG tablet, D-dimer, quantitative (not at [facility])  Tachycardia - Plan: EKG 12-Lead, POCT CBC, TSH, CANCELED: D-dimer, quantitative (not at [facility])  History of asthma  Other chest pain - Plan: D-dimer, quantitative (not at [facility]), CANCELED: D-dimer, quantitative (not at [facility])  Dyspnea, unspecified type - Plan: D-dimer, quantitative (not at [facility]), CANCELED: D-dimer, quantitative (not at [facility])  Acute bronchitis, unspecified organism - Plan: levofloxacin (LEVAQUIN) 500 MG tablet, benzonatate (TESSALON) 100 MG capsule  Initial symptoms of viral illness/acute bronchitis on January 24. Seen  again degenerative 27th and diagnosed with influenza B. Received Tamiflu, symptomatic care. Return due to persistent cough, chest discomfort with coughing, and dyspnea.   -Chest x-Rollins without acute findings in office, but with leukocytosis, initial concern of early CAP. Started on Levaquin 500 milligrams daily as previous treatment with azithromycin.  - No known risk factors for PE/DVT, but with persistent tachycardia from last few visits and reported dyspnea, d-dimer was ordered. Patient did not obtain that test on day of evaluation, asked to return  today.   7:26 PM  D-dimer elevated as above at 1.51 this morning.  CT chest was ordered earlier this afternoon.   -CT results noted with evidence of pulmonary embolus left main as well as right lower lobe segment. Discussed with patient on phone at imaging center, she will be seen immediately at [facility] emergency room. Charge nurse advised at [employer] .    Meds ordered this encounter  Medications   levofloxacin (LEVAQUIN) 500 MG tablet    Sig: Take 1 tablet (500 mg total) by mouth daily.    Dispense:  7 tablet    Refill:  0   benzonatate (TESSALON) 100 MG capsule    Sig: Take 1-2 capsules (100-200 mg total) by mouth 3 (three) times daily as needed for cough.    Dispense:  40 capsule    Refill:  0   Patient Instructions    Because your heart rate is elevated, and with shortness of breath/chest soreness, I am checking a blood clot test. If that test is elevated, we will need to check a CAT scan of your chest.  Your infection fighting cells were elevated, so we will treat with a different antibiotic Levaquin 1 pill once per day for the next 1 week to treat for bronchitis or a possible pneumonia that was not seen on chest x-Rollins. Okay to continue the [employer] 3 times per day as needed for cough. If you have any wheezing or increased shortness of breath, return for recheck.  If not improving within the next 4-5 days, return for  recheck, sooner if worse.  I also sent a thyroid test because of the elevated heart rate, but suspect that will be normal.  Return to the clinic or go to the nearest emergency room if any of your symptoms worsen or new symptoms occur.   Shortness of Breath, Adult Shortness of breath is when a person has trouble breathing enough air, or when a person feels like she or he is having trouble breathing in enough air. Shortness of breath could be a sign of medical problem. Follow these instructions at home: Pay attention to any changes in your symptoms. Take these actions to help with your condition:  Do not smoke. Smoking is a common cause of shortness of breath. If you smoke and you need help quitting, ask your health care provider.  Avoid things that can irritate your airways, such as:  Mold.  Dust.  Air pollution.  Chemical fumes.  Things that can cause allergy symptoms (allergens), if you have allergies.  Keep your living space clean and free of mold and dust.  Rest as needed. Slowly return to your usual activities.  Take over-the-counter and prescription medicines, including oxygen and inhaled medicines, only as told by your health care provider.  Keep all follow-up visits as told by your health care provider. This is important. Contact a health care provider if:  Your condition does not improve as soon as expected.  You have a hard time doing your normal activities, even after you rest.  You have new symptoms. Get help right away if:  Your shortness of breath gets worse.  You have shortness of breath when you are resting.  You feel light-headed or you faint.  You have a cough that is not controlled with medicines.  You cough up blood.  You have pain with breathing.  You have pain in your chest, arms, shoulders, or abdomen.  You have a fever.  You cannot walk  up stairs or exercise the way that you normally do. This information is not intended to replace  advice given to you by your health care provider. Make sure you discuss any questions you have with your health care provider. Document Released: 01/17/2001 Document Revised: 11/13/2015 Document Reviewed: 09/30/2015 Elsevier Interactive Patient Education  2017 [employer] Inc.  Cough, Adult Coughing is a reflex that clears your throat and your airways. Coughing helps to heal and protect your lungs. It is normal to cough occasionally, but a cough that happens with other symptoms or lasts a long time may be a sign of a condition that needs treatment. A cough may last only 2-3 weeks (acute), or it may last longer than 8 weeks (chronic). What are the causes? Coughing is commonly caused by:  Breathing in substances that irritate your lungs.  A viral or bacterial respiratory infection.  Allergies.  Asthma.  Postnasal drip.  Smoking.  Acid backing up from the stomach into the esophagus (gastroesophageal reflux).  Certain medicines.  Chronic lung problems, including COPD (or rarely, lung cancer).  Other medical conditions such as heart failure. Follow these instructions at home: Pay attention to any changes in your symptoms. Take these actions to help with your discomfort:  Take medicines only as told by your health care provider.  If you were prescribed an antibiotic medicine, take it as told by your health care provider. Do not stop taking the antibiotic even if you start to feel better.  Talk with your health care provider before you take a cough suppressant medicine.  Drink enough fluid to keep your urine clear or pale yellow.  If the air is dry, use a cold steam vaporizer or humidifier in your bedroom or your home to help loosen secretions.  Avoid anything that causes you to cough at work or at home.  If your cough is worse at night, try sleeping in a semi-upright position.  Avoid cigarette smoke. If you smoke, quit smoking. If you need help quitting, ask your health care  provider.  Avoid caffeine.  Avoid alcohol.  Rest as needed. Contact a health care provider if:  You have new symptoms.  You cough up pus.  Your cough does not get better after 2-3 weeks, or your cough gets worse.  You cannot control your cough with suppressant medicines and you are losing sleep.  You develop pain that is getting worse or pain that is not controlled with pain medicines.  You have a fever.  You have unexplained weight loss.  You have night sweats. Get help right away if:  You cough up blood.  You have difficulty breathing.  Your heartbeat is very fast. This information is not intended to replace advice given to you by your health care provider. Make sure you discuss any questions you have with your health care provider. Document Released: 10/21/2010 Document Revised: 09/30/2015 Document Reviewed: 07/01/2014 Elsevier Interactive Patient Education  2017 Elsevier Inc.  Sinus Tachycardia Sinus tachycardia is a kind of fast heartbeat. In sinus tachycardia, the heart beats more than 100 times a minute. Sinus tachycardia starts in a part of the heart called the sinus node. Sinus tachycardia may be harmless, or it may be a sign of a serious condition. What are the causes? This condition may be caused by:  Exercise or exertion.  A fever.  Pain.  Loss of body fluids (dehydration).  Severe bleeding (hemorrhage).  Anxiety and stress.  Certain substances, including:  Alcohol.  Caffeine.  Tobacco and nicotine  products.  Diet pills.  Illegal drugs.  Medical conditions including:  Heart disease.  An infection.  An overactive thyroid (hyperthyroidism).  A lack of red blood cells (anemia). What are the signs or symptoms? Symptoms of this condition include:  A feeling that the heart is beating quickly (palpitations).  Suddenly noticing your heartbeat (cardiac awareness).  Dizziness.  Tiredness (fatigue).  Shortness of breath.  Chest  pain.  Nausea.  Fainting. How is this diagnosed? This condition is diagnosed with:  A physical exam.  Other tests, such as:  Blood tests.  An electrocardiogram (ECG). This test measures the electrical activity of the heart.  Holter monitoring. For this test, you wear a device that records your heartbeat for one or more days. You may be referred to a heart specialist (cardiologist). How is this treated? Treatment for this condition depends on the cause or underlying condition. Treatment may involve:  Treating the underlying condition.  Taking new medicines or changing your current medicines as told by your health care provider.  Making changes to your diet or lifestyle.  Practicing relaxation methods. Follow these instructions at home: Lifestyle  Do not use any products that contain nicotine or tobacco, such as cigarettes and e-cigarettes. If you need help quitting, ask your health care provider.  Learn relaxation methods, like deep breathing, to help you when you get stressed or anxious.  Do not use illegal drugs, such as cocaine.  Do not abuse alcohol. Limit alcohol intake to no more than 1 drink a day for non-pregnant women and 2 drinks a day for men. One drink equals 12 oz of beer, 5 oz of wine, or 1 oz of hard liquor.  Find time to rest and relax often. This reduces stress.  Avoid:  Caffeine.  Stimulants such as over-the-counter diet pills or pills that help you to stay awake.  Situations that cause anxiety or stress. General instructions  Drink enough fluids to keep your urine clear or pale yellow.  Take over-the-counter and prescription medicines only as told by your health care provider.  Keep all follow-up visits as told by your health care provider. This is important. Contact a health care provider if:  You have a fever.  You have vomiting or diarrhea that keeps happening (is persistent). Get help right away if:  You have pain in your chest,  upper arms, jaw, or neck.  You become weak or dizzy.  You feel faint.  You have palpitations that do not go away. This information is not intended to replace advice given to you by your health care provider. Make sure you discuss any questions you have with your health care provider. Document Released: 06/01/2004 Document Revised: 11/20/2015 Document Reviewed: 11/06/2014 Elsevier Interactive Patient Education  2017 [employer].    IF you received an x-Rollins today, you will receive an invoice from [facility]. Please contact [facility] at [phone] with questions or concerns regarding your invoice.   IF you received labwork today, you will receive an invoice from [place]. Please contact [facility] at [phone] with questions or concerns regarding your invoice.   Our billing staff will not be able to assist you with questions regarding bills from these companies.  You will be contacted with the lab results as soon as they are available. The fastest way to get your results is to activate your My Chart account. Instructions are located on the last page of this paperwork. If you have not heard from [place] regarding the results in 2 weeks,  please contact this office.       I personally performed the services described in this documentation, which was scribed in my presence. The recorded information has been reviewed and considered for accuracy and completeness, addended by me as needed, and agree with information above.  Signed,   Natasha Ray, MD [facility] [place].  06/20/16 7:19 PM

## 2016-06-19 NOTE — Patient Instructions (Addendum)
Because your heart rate is elevated, and with shortness of breath/chest soreness, I am checking a blood clot test. If that test is elevated, we will need to check a CAT scan of your chest.  Your infection fighting cells were elevated, so we will treat with a different antibiotic Levaquin 1 pill once per day for the next 1 week to treat for bronchitis or a possible pneumonia that was not seen on chest x-ray. Okay to continue the [employer] 3 times per day as needed for cough. If you have any wheezing or increased shortness of breath, return for recheck.  If not improving within the next 4-5 days, return for recheck, sooner if worse.  I also sent a thyroid test because of the elevated heart rate, but suspect that will be normal.  Return to the clinic or go to the nearest emergency room if any of your symptoms worsen or new symptoms occur.   Shortness of Breath, Adult Shortness of breath is when a person has trouble breathing enough air, or when a person feels like she or he is having trouble breathing in enough air. Shortness of breath could be a sign of medical problem. Follow these instructions at home: Pay attention to any changes in your symptoms. Take these actions to help with your condition:  Do not smoke. Smoking is a common cause of shortness of breath. If you smoke and you need help quitting, ask your health care provider.  Avoid things that can irritate your airways, such as:  Mold.  Dust.  Air pollution.  Chemical fumes.  Things that can cause allergy symptoms (allergens), if you have allergies.  Keep your living space clean and free of mold and dust.  Rest as needed. Slowly return to your usual activities.  Take over-the-counter and prescription medicines, including oxygen and inhaled medicines, only as told by your health care provider.  Keep all follow-up visits as told by your health care provider. This is important. Contact a health care provider if:  Your  condition does not improve as soon as expected.  You have a hard time doing your normal activities, even after you rest.  You have new symptoms. Get help right away if:  Your shortness of breath gets worse.  You have shortness of breath when you are resting.  You feel light-headed or you faint.  You have a cough that is not controlled with medicines.  You cough up blood.  You have pain with breathing.  You have pain in your chest, arms, shoulders, or abdomen.  You have a fever.  You cannot walk up stairs or exercise the way that you normally do. This information is not intended to replace advice given to you by your health care provider. Make sure you discuss any questions you have with your health care provider. Document Released: 01/17/2001 Document Revised: 11/13/2015 Document Reviewed: 09/30/2015 Elsevier Interactive Patient Education  2017 Elsevier Inc.  Cough, Adult Coughing is a reflex that clears your throat and your airways. Coughing helps to heal and protect your lungs. It is normal to cough occasionally, but a cough that happens with other symptoms or lasts a long time may be a sign of a condition that needs treatment. A cough may last only 2-3 weeks (acute), or it may last longer than 8 weeks (chronic). What are the causes? Coughing is commonly caused by:  Breathing in substances that irritate your lungs.  A viral or bacterial respiratory infection.  Allergies.  Asthma.  Postnasal drip.  Smoking.  Acid backing up from the stomach into the esophagus (gastroesophageal reflux).  Certain medicines.  Chronic lung problems, including COPD (or rarely, lung cancer).  Other medical conditions such as heart failure. Follow these instructions at home: Pay attention to any changes in your symptoms. Take these actions to help with your discomfort:  Take medicines only as told by your health care provider.  If you were prescribed an antibiotic medicine, take it  as told by your health care provider. Do not stop taking the antibiotic even if you start to feel better.  Talk with your health care provider before you take a cough suppressant medicine.  Drink enough fluid to keep your urine clear or pale yellow.  If the air is dry, use a cold steam vaporizer or humidifier in your bedroom or your home to help loosen secretions.  Avoid anything that causes you to cough at work or at home.  If your cough is worse at night, try sleeping in a semi-upright position.  Avoid cigarette smoke. If you smoke, quit smoking. If you need help quitting, ask your health care provider.  Avoid caffeine.  Avoid alcohol.  Rest as needed. Contact a health care provider if:  You have new symptoms.  You cough up pus.  Your cough does not get better after 2-3 weeks, or your cough gets worse.  You cannot control your cough with suppressant medicines and you are losing sleep.  You develop pain that is getting worse or pain that is not controlled with pain medicines.  You have a fever.  You have unexplained weight loss.  You have night sweats. Get help right away if:  You cough up blood.  You have difficulty breathing.  Your heartbeat is very fast. This information is not intended to replace advice given to you by your health care provider. Make sure you discuss any questions you have with your health care provider. Document Released: 10/21/2010 Document Revised: 09/30/2015 Document Reviewed: 07/01/2014 Elsevier Interactive Patient Education  2017 Elsevier Inc.  Sinus Tachycardia Sinus tachycardia is a kind of fast heartbeat. In sinus tachycardia, the heart beats more than 100 times a minute. Sinus tachycardia starts in a part of the heart called the sinus node. Sinus tachycardia may be harmless, or it may be a sign of a serious condition. What are the causes? This condition may be caused by:  Exercise or exertion.  A fever.  Pain.  Loss of body  fluids (dehydration).  Severe bleeding (hemorrhage).  Anxiety and stress.  Certain substances, including:  Alcohol.  Caffeine.  Tobacco and nicotine products.  Diet pills.  Illegal drugs.  Medical conditions including:  Heart disease.  An infection.  An overactive thyroid (hyperthyroidism).  A lack of red blood cells (anemia). What are the signs or symptoms? Symptoms of this condition include:  A feeling that the heart is beating quickly (palpitations).  Suddenly noticing your heartbeat (cardiac awareness).  Dizziness.  Tiredness (fatigue).  Shortness of breath.  Chest pain.  Nausea.  Fainting. How is this diagnosed? This condition is diagnosed with:  A physical exam.  Other tests, such as:  Blood tests.  An electrocardiogram (ECG). This test measures the electrical activity of the heart.  Holter monitoring. For this test, you wear a device that records your heartbeat for one or more days. You may be referred to a heart specialist (cardiologist). How is this treated? Treatment for this condition depends on the cause or underlying condition. Treatment may involve:  Treating  the underlying condition.  Taking new medicines or changing your current medicines as told by your health care provider.  Making changes to your diet or lifestyle.  Practicing relaxation methods. Follow these instructions at home: Lifestyle  Do not use any products that contain nicotine or tobacco, such as cigarettes and e-cigarettes. If you need help quitting, ask your health care provider.  Learn relaxation methods, like deep breathing, to help you when you get stressed or anxious.  Do not use illegal drugs, such as cocaine.  Do not abuse alcohol. Limit alcohol intake to no more than 1 drink a day for non-pregnant women and 2 drinks a day for men. One drink equals 12 oz of beer, 5 oz of wine, or 1 oz of hard liquor.  Find time to rest and relax often. This reduces  stress.  Avoid:  Caffeine.  Stimulants such as over-the-counter diet pills or pills that help you to stay awake.  Situations that cause anxiety or stress. General instructions  Drink enough fluids to keep your urine clear or pale yellow.  Take over-the-counter and prescription medicines only as told by your health care provider.  Keep all follow-up visits as told by your health care provider. This is important. Contact a health care provider if:  You have a fever.  You have vomiting or diarrhea that keeps happening (is persistent). Get help right away if:  You have pain in your chest, upper arms, jaw, or neck.  You become weak or dizzy.  You feel faint.  You have palpitations that do not go away. This information is not intended to replace advice given to you by your health care provider. Make sure you discuss any questions you have with your health care provider. Document Released: 06/01/2004 Document Revised: 11/20/2015 Document Reviewed: 11/06/2014 Elsevier Interactive Patient Education  2017 [employer].    IF you received an x-ray today, you will receive an invoice from [facility]. Please contact [facility] at [phone] with questions or concerns regarding your invoice.   IF you received labwork today, you will receive an invoice from [place]. Please contact [facility] at [phone] with questions or concerns regarding your invoice.   Our billing staff will not be able to assist you with questions regarding bills from these companies.  You will be contacted with the lab results as soon as they are available. The fastest way to get your results is to activate your My Chart account. Instructions are located on the last page of this paperwork. If you have not heard from [place] regarding the results in 2 weeks, please contact this office.

## 2016-06-20 ENCOUNTER — Telehealth: Payer: Self-pay | Admitting: Emergency Medicine

## 2016-06-20 ENCOUNTER — Ambulatory Visit
Admission: RE | Admit: 2016-06-20 | Discharge: 2016-06-20 | Disposition: A | Discharge status: Home / Self Care | Payer: BC Managed Care – PPO | Source: Ambulatory Visit | Attending: Family Medicine | Admitting: Family Medicine

## 2016-06-20 ENCOUNTER — Ambulatory Visit (HOSPITAL_COMMUNITY): Payer: BC Managed Care – PPO

## 2016-06-20 ENCOUNTER — Encounter: Payer: Self-pay | Admitting: Family Medicine

## 2016-06-20 ENCOUNTER — Encounter (HOSPITAL_COMMUNITY): Payer: Self-pay

## 2016-06-20 ENCOUNTER — Observation Stay (HOSPITAL_COMMUNITY)
Admission: EM | Admit: 2016-06-20 | Discharge: 2016-06-22 | Disposition: A | Discharge status: Home / Self Care | Payer: BC Managed Care – PPO | Attending: Internal Medicine | Admitting: Internal Medicine

## 2016-06-20 ENCOUNTER — Telehealth: Payer: Self-pay | Admitting: Family Medicine

## 2016-06-20 DIAGNOSIS — M170 Bilateral primary osteoarthritis of knee: Secondary | ICD-10-CM | POA: Diagnosis not present

## 2016-06-20 DIAGNOSIS — Z794 Long term (current) use of insulin: Secondary | ICD-10-CM | POA: Insufficient documentation

## 2016-06-20 DIAGNOSIS — D3502 Benign neoplasm of left adrenal gland: Secondary | ICD-10-CM | POA: Diagnosis not present

## 2016-06-20 DIAGNOSIS — Z880 Allergy status to penicillin: Secondary | ICD-10-CM | POA: Insufficient documentation

## 2016-06-20 DIAGNOSIS — I700 Atherosclerosis of aorta: Secondary | ICD-10-CM | POA: Diagnosis not present

## 2016-06-20 DIAGNOSIS — E119 Type 2 diabetes mellitus without complications: Secondary | ICD-10-CM | POA: Diagnosis not present

## 2016-06-20 DIAGNOSIS — Z7951 Long term (current) use of inhaled steroids: Secondary | ICD-10-CM | POA: Diagnosis not present

## 2016-06-20 DIAGNOSIS — I82433 Acute embolism and thrombosis of popliteal vein, bilateral: Secondary | ICD-10-CM | POA: Diagnosis not present

## 2016-06-20 DIAGNOSIS — I82442 Acute embolism and thrombosis of left tibial vein: Secondary | ICD-10-CM | POA: Insufficient documentation

## 2016-06-20 DIAGNOSIS — D72829 Elevated white blood cell count, unspecified: Secondary | ICD-10-CM | POA: Diagnosis present

## 2016-06-20 DIAGNOSIS — Z6841 Body Mass Index (BMI) 40.0 and over, adult: Secondary | ICD-10-CM | POA: Diagnosis not present

## 2016-06-20 DIAGNOSIS — I2699 Other pulmonary embolism without acute cor pulmonale: Principal | ICD-10-CM | POA: Diagnosis present

## 2016-06-20 DIAGNOSIS — I10 Essential (primary) hypertension: Secondary | ICD-10-CM | POA: Insufficient documentation

## 2016-06-20 DIAGNOSIS — R7989 Other specified abnormal findings of blood chemistry: Secondary | ICD-10-CM

## 2016-06-20 DIAGNOSIS — E118 Type 2 diabetes mellitus with unspecified complications: Secondary | ICD-10-CM

## 2016-06-20 DIAGNOSIS — R0600 Dyspnea, unspecified: Secondary | ICD-10-CM

## 2016-06-20 LAB — CBC
HEMATOCRIT: 35.3 % — AB (ref 36.0–46.0)
HEMOGLOBIN: 11.3 g/dL — AB (ref 12.0–15.0)
MCH: 28 pg (ref 26.0–34.0)
MCHC: 32 g/dL (ref 30.0–36.0)
MCV: 87.6 fL (ref 78.0–100.0)
Platelets: 330 10*3/uL (ref 150–400)
RBC: 4.03 MIL/uL (ref 3.87–5.11)
RDW: 15.2 % (ref 11.5–15.5)
WBC: 16.4 10*3/uL — ABNORMAL HIGH (ref 4.0–10.5)

## 2016-06-20 LAB — PROTIME-INR
INR: 1.02
Prothrombin Time: 13.4 seconds (ref 11.4–15.2)

## 2016-06-20 LAB — BASIC METABOLIC PANEL
Anion gap: 10 (ref 5–15)
BUN: 21 mg/dL — AB (ref 6–20)
CO2: 29 mmol/L (ref 22–32)
Calcium: 10 mg/dL (ref 8.9–10.3)
Chloride: 99 mmol/L — ABNORMAL LOW (ref 101–111)
Creatinine, Ser: 0.96 mg/dL (ref 0.44–1.00)
GFR calc non Af Amer: >60 mL/min (ref >60)
Glucose, Bld: 142 mg/dL — ABNORMAL HIGH (ref 65–99)
Potassium: 3.6 mmol/L (ref 3.5–5.1)
Sodium: 138 mmol/L (ref 135–145)

## 2016-06-20 LAB — TSH: TSH: 0.72 u[IU]/mL (ref 0.450–4.500)

## 2016-06-20 LAB — D-DIMER, QUANTITATIVE: D-DIMER: 1.51 mg/L FEU — ABNORMAL HIGH (ref 0.00–0.49)

## 2016-06-20 MED ORDER — ONDANSETRON HCL 4 MG PO TABS
4.0000 mg | ORAL_TABLET | Freq: Four times a day (QID) | ORAL | Status: DC | PRN
  Filled 2016-06-20: qty 1

## 2016-06-20 MED ORDER — ONDANSETRON HCL 4 MG/2ML IJ SOLN
4.0000 mg | Freq: Four times a day (QID) | INTRAMUSCULAR | Status: DC | PRN
  Filled 2016-06-20: qty 2

## 2016-06-20 MED ORDER — ACETAMINOPHEN 325 MG PO TABS
650.0000 mg | ORAL_TABLET | Freq: Four times a day (QID) | ORAL | Status: DC | PRN
  Administered 2016-06-21 – 2016-06-22 (×5): 650 mg via ORAL
  Filled 2016-06-20 (×5): qty 2

## 2016-06-20 MED ORDER — ALBUTEROL SULFATE (2.5 MG/3ML) 0.083% IN NEBU: 2.5000 mg | INHALATION_SOLUTION | RESPIRATORY_TRACT | Status: DC | PRN

## 2016-06-20 MED ORDER — ACETAMINOPHEN 650 MG RE SUPP: 650.0000 mg | Freq: Four times a day (QID) | RECTAL | Status: DC | PRN

## 2016-06-20 MED ORDER — HEPARIN SODIUM (PORCINE) 5000 UNIT/ML IJ SOLN: 4000.0000 [IU] | Freq: Once | INTRAMUSCULAR | Status: DC

## 2016-06-20 MED ORDER — HEPARIN BOLUS VIA INFUSION
5000.0000 [IU] | Freq: Once | INTRAVENOUS | Status: AC
  Administered 2016-06-20: 5000 [IU] via INTRAVENOUS
  Filled 2016-06-20: qty 5000

## 2016-06-20 MED ORDER — HEPARIN (PORCINE) IN NACL 100-0.45 UNIT/ML-% IJ SOLN
1800.0000 [IU]/h | INTRAMUSCULAR | Status: DC
Start: 2016-06-20 — End: 2016-06-21
  Administered 2016-06-20 – 2016-06-21 (×2): 1800 [IU]/h via INTRAVENOUS
  Filled 2016-06-20 (×2): qty 250

## 2016-06-20 MED ORDER — IOPAMIDOL (ISOVUE-370) INJECTION 76%
100.0000 mL | Freq: Once | INTRAVENOUS | Status: AC | PRN
  Administered 2016-06-20: 100 mL via INTRAVENOUS

## 2016-06-20 NOTE — Telephone Encounter (Signed)
Gi checking on pa status

## 2016-06-20 NOTE — ED Triage Notes (Signed)
Pt states she was sent by Md due blood clots in lungs seen on scan today; pt states hx of recent flu, SOB and cough for about 2 weeks; pt denies pain at triage; pt a&ox 4 on arrival.

## 2016-06-20 NOTE — Telephone Encounter (Signed)
Pt is scheduled at [place] at 540pm and need to arrive at 520pm for stat CT Angio/ Chest.   Pt is aware and verbalized understanding

## 2016-06-20 NOTE — ED Notes (Signed)
ED Provider at bedside. [place] 

## 2016-06-20 NOTE — Telephone Encounter (Signed)
Auth attached to referral and apt. Thank you!

## 2016-06-20 NOTE — ED Notes (Signed)
Pt given [place] sandwich and ginger ale; tolerating well

## 2016-06-20 NOTE — H&P (Signed)
History and Physical    Natasha Rollins DOB: Apr 04, 1961 DOA: 06/20/2016  Referring MD/NP/PA:Tyler Girtha Hake PCP: Lilian Coma, MD  Patient coming from: home  Chief Complaint: Pulmonary embolism  HPI: Natasha Rollins is a 56 y.o. female with medical history significant of HTN and DM type 2; who presents with complaints of recent diagnosis of pulmonary embolism. Patient notes symptoms initially started 1/24, with complaints of cough and shortness of breath. She was evaluated by her PCP and diagnosed with acute bronchitis for which she was given Z-Pak, Tessalon Perles, and Flonase without relief of symptoms. She reports returning to her PCP office 3 days later for which she was diagnosed with the fluid was started on Tamiflu. Review of records shows that patient's heart rates were elevated greater than 100 since the 24th. Despite these treatments the patient notes that she still continued to have a cough. Therefore went back to her PCP for further evaluation. Associated symptoms include shortness of breath. Checks x-ray showed no acute findings. At that time she was started on Levaquin. Due to her elevated heart rates a d-dimer was checked and seen to be elevated. Patient was sent for a CT angiogram of the chest today which revealed an acute pulmonary embolus. Patient notes that she had received a steroid injection 4 days ago and her right knee secondary to osteoarthritis. She denies any recent travel, calf pain, significant leg swelling, chest pain, hemoptysis, lightheadedness, or loss of consciousness. Patient notes that with her job and because of her knee pain she does tend to sit for long periods in time.   ED course : Upon admission to the emergency department patient was evaluated and seen to be afebrile, heart rates 92-117, respirations 18-28, blood pressures maintained, and O2 saturation maintained on RA. Lab work revealed WBC 16.4, hemoglobin 11.3, and all other values  relatively within normal limits. Patient was ordered to be started on a heparin drip.  Review of Systems: As per HPI otherwise 10 point review of systems negative.   Past Medical History:  Diagnosis Date  . Diabetes mellitus without complication ([place])   . Hypertension     Past Surgical History:  Procedure Laterality Date  . CHOLECYSTECTOMY    . HERNIA REPAIR    . TONSILLECTOMY       reports that she has never smoked. She has never used smokeless tobacco. She reports that she does not drink alcohol or use drugs.  Allergies  Allergen Reactions  . Penicillins     REACTION: rash    No family history on file.  Prior to Admission medications   Medication Sig Start Date End Date Taking? Authorizing Provider  acetaminophen-codeine (TYLENOL #3) 300-30 MG tablet  05/07/16   Historical Provider, MD  amLODipine (NORVASC) 10 MG tablet Take 10 mg by mouth daily. 06/07/14   Historical Provider, MD  B-D UF III MINI PEN NEEDLES 31G X 5 MM MISC  07/28/14   Historical Provider, MD  benzonatate (TESSALON) 100 MG capsule Take 1-2 capsules (100-200 mg total) by mouth 3 (three) times daily as needed for cough. 06/19/16   Wendie Agreste, MD  doxycycline (VIBRA-TABS) 100 MG tablet Take 1 tablet (100 mg total) by mouth 2 (two) times daily. 02/24/14   Gay Filler Copland, MD  fluticasone (FLONASE) 50 MCG/ACT nasal spray Place 2 sprays into both nostrils daily. 05/31/16   Gelene Mink McVey, PA-C  folic acid (FOLVITE) 1 MG tablet Take 1 mg by mouth daily.    Historical Provider,  MD  LEVEMIR FLEXTOUCH 100 UNIT/ML Pen  07/28/14   Historical Provider, MD  levofloxacin (LEVAQUIN) 500 MG tablet Take 1 tablet (500 mg total) by mouth daily. 06/19/16   Wendie Agreste, MD  metFORMIN (GLUCOPHAGE-XR) 750 MG 24 hr tablet Take 750 mg by mouth daily with breakfast.  06/07/14   Historical Provider, MD  metroNIDAZOLE (METROGEL) 1 % gel Apply topically daily. Use as needed on face 02/24/14   Darreld Mclean, MD    NOVOLOG FLEXPEN 100 UNIT/ML FlexPen  07/28/14   Historical Provider, MD  TRADJENTA 5 MG TABS tablet Take 5 mg by mouth daily.  07/10/14   Historical Provider, MD  valsartan (DIOVAN) 320 MG tablet Take 320 mg by mouth daily.    Historical Provider, MD    Physical Exam: Constitutional: Obese female in NAD, calm, comfortable Vitals:   06/20/16 2118 06/20/16 2119 06/20/16 2120 06/20/16 2121  BP: 129/69     Pulse:  97 98 95  Resp:    (!) 28  Temp:      TempSrc:      SpO2:  96% 98% 98%   Eyes: PERRL, lids and conjunctivae normal ENMT: Mucous membranes are moist. Posterior pharynx clear of any exudate or lesions.Normal dentition.  Neck: normal, supple, no masses, no thyromegaly Respiratory: clear to auscultation bilaterally, no wheezing, no crackles. Mildly tachypneic. No accessory muscle use.  Cardiovascular: Tachycardic, no murmurs / rubs / gallops. No extremity edema. 2+ pedal pulses. No carotid bruits.  Abdomen: no tenderness, no masses palpated. No hepatosplenomegaly. Bowel sounds positive.  Musculoskeletal: no clubbing / cyanosis. No joint deformity upper and lower extremities. Good ROM, no contractures. Normal muscle tone.  Skin: no rashes, lesions, ulcers. No induration Neurologic: CN 2-12 grossly intact. Sensation intact, DTR normal. Strength 5/5 in all 4.  Psychiatric: Normal judgment and insight. Alert and oriented x 3. Normal mood.     Labs on Admission: I have personally reviewed following labs and imaging studies  CBC:  Recent Labs Lab 06/19/16 1748 06/20/16 2047  WBC 14.1* 16.4*  HGB 12.7 11.3*  HCT 35.3* 35.3*  MCV 84.6 87.6  PLT  --  [id number]   Basic Metabolic Panel:  Recent Labs Lab 06/20/16 2047  NA 138  K 3.6  CL 99*  CO2 29  GLUCOSE 142*  BUN 21*  CREATININE 0.96  CALCIUM 10.0   GFR: CrCl cannot be calculated (Unknown ideal weight.). Liver Function Tests: No results for input(s): AST, ALT, ALKPHOS, BILITOT, PROT, ALBUMIN in the last 168 hours. No  results for input(s): LIPASE, AMYLASE in the last 168 hours. No results for input(s): AMMONIA in the last 168 hours. Coagulation Profile:  Recent Labs Lab 06/20/16 2047  INR 1.02   Cardiac Enzymes: No results for input(s): CKTOTAL, CKMB, CKMBINDEX, TROPONINI in the last 168 hours. BNP (last 3 results) No results for input(s): PROBNP in the last 8760 hours. HbA1C: No results for input(s): HGBA1C in the last 72 hours. CBG: No results for input(s): GLUCAP in the last 168 hours. Lipid Profile: No results for input(s): CHOL, HDL, LDLCALC, TRIG, CHOLHDL, LDLDIRECT in the last 72 hours. Thyroid Function Tests:  Recent Labs  06/19/16 1738  TSH 0.720   Anemia Panel: No results for input(s): VITAMINB12, FOLATE, FERRITIN, TIBC, IRON, RETICCTPCT in the last 72 hours. Urine analysis:    Component Value Date/Time   LABSPEC >=1.030 04/03/2012 1251   PHURINE 5.0 04/03/2012 1251   GLUCOSEU NEGATIVE 04/03/2012 1251   HGBUR MODERATE (A) 04/03/2012 1251  BILIRUBINUR neg 05/15/2013 1759   KETONESUR NEGATIVE 04/03/2012 1251   PROTEINUR 100 05/15/2013 1759   PROTEINUR >=300 (A) 04/03/2012 1251   UROBILINOGEN 0.2 05/15/2013 1759   UROBILINOGEN 0.2 04/03/2012 1251   NITRITE neg 05/15/2013 1759   NITRITE NEGATIVE 04/03/2012 1251   LEUKOCYTESUR Negative 05/15/2013 1759   Sepsis Labs: No results found for this or any previous visit (from the past 240 hour(s)).   Radiological Exams on Admission: Dg Chest 2 View  Result Date: 06/19/2016 CLINICAL DATA:  56 year old presenting with cough and tachycardia. Current history of hypertension and diabetes. EXAM: CHEST  2 VIEW COMPARISON:  06/03/2016. FINDINGS: Cardiac silhouette normal in size, unchanged. Thoracic aorta mildly atherosclerotic, unchanged. Hilar and mediastinal contours otherwise unremarkable. Lungs clear. Bronchovascular markings normal. Pulmonary vascularity normal. No visible pleural effusions. No pneumothorax. Mild degenerative  changes involving the thoracic spine. IMPRESSION: 1.  No acute cardiopulmonary disease. 2. Mild thoracic aortic atherosclerosis. Electronically Signed   By: Evangeline Dakin M.D.   On: 06/19/2016 17:56   Ct Angio Chest W/cm &/or Wo Cm  Result Date: 06/20/2016 CLINICAL DATA:  Subacute onset of shortness of breath. Elevated D-dimer. Initial encounter. EXAM: CT ANGIOGRAPHY CHEST WITH CONTRAST TECHNIQUE: Multidetector CT imaging of the chest was performed using the standard protocol during bolus administration of intravenous contrast. Multiplanar CT image reconstructions and MIPs were obtained to evaluate the vascular anatomy. CONTRAST:  100 mL of Omnipaque 300 IV contrast COMPARISON:  Chest radiograph performed 06/19/2016 FINDINGS: Cardiovascular: Pulmonary embolus is noted within the left main pulmonary artery, extending into the left upper and lower lobes. There is also pulmonary embolus within a segmental pulmonary artery branch to the right lower lobe. The RV/LV ratio is 0.85, without definite evidence for right heart strain. The heart is borderline normal in size. Mild calcification is noted along the aortic arch. The great vessels are grossly unremarkable in appearance. Mediastinum/Nodes: Visualized mediastinal nodes remain borderline normal in size. No pericardial effusion is identified. The thyroid gland is difficult to fully characterize, but appears grossly unremarkable. No axillary lymphadenopathy is seen. Lungs/Pleura: Minimal bibasilar atelectasis is noted. No pleural effusion or pneumothorax is seen. No masses are identified. Upper Abdomen: The visualized portions of the liver and spleen are grossly unremarkable. A likely fat-containing left adrenal adenoma is partially characterized. Musculoskeletal: No acute osseous abnormalities are identified. The visualized musculature is unremarkable in appearance. Review of the MIP images confirms the above findings. IMPRESSION: 1. Pulmonary embolus within the  left main pulmonary artery, extending into the left upper and lower lobes. Pulmonary embolus within a segmental pulmonary artery branch to the right lower lobe. RV/LV ratio of 0.85, without definite CT evidence for right heart strain at this time. 2. Minimal bibasilar atelectasis noted.  Lungs otherwise clear. 3. Likely fat-containing left adrenal adenoma partially characterized. Critical Value/emergent results were called by telephone at the time of interpretation on 06/20/2016 at 6:43 pm to Dr. [name], who verbally acknowledged these results. Electronically Signed   By: Garald Balding M.D.   On: 06/20/2016 18:45    EKG: Independently reviewed. Sinus tachycardia with premature atrial complexes  Assessment/Plan Pulmonary embolus: Acute. Patient found to have left main pulmonary artery embolus by CTA in the outpatient setting. Unclear to exactly say symptoms were performed at this time although patient admits to prolonged sedentary periods.. - Admit to telemetry bed - Heparin gtt per pharmacy - Check echocardiogram - Albuterol prn SOB/ Wheezing - Determine best oral anticoagulation agent for patient  Leukocytosis: WBC elevated  at 16.4. Suspect that this could be reactive. - Recheck CBC in a.m.   Essential hypertension - Continue amlodipine and diovan  Diabetes mellitus type 2 - Hypoglycemic protocols - Held Tradjenta - Reduced home Levemir 75 units twice a day to 50 units twice a day - Reduced home NovoLog 30 units 3 times with meals to 20 units with meals - Adjust regimen as needed  DVT prophylaxis: Heparin gtt Code Status: full Family Communication: Discussed plan of care with the patient and family present at bedside  Disposition Plan:Possible discharge home 1-2 days  Consults called: none Admission status: Observation  Norval Morton MD [facility] Pager [phone]  If 7PM-7AM, please contact night-coverage [email or web address] Password [email or web address]  06/20/2016, 9:52 PM

## 2016-06-20 NOTE — Telephone Encounter (Signed)
Stat d-dimer result reviewed, 1.51, elevated. See office visit yesterday, with her dyspnea, tachycardia, and now elevated d-dimer, will check a CT chest to rule out PE.

## 2016-06-20 NOTE — ED Provider Notes (Signed)
[facility] DEPT Provider Note   CSN: [id number] Arrival date & time: 06/20/16  1952     History   Chief Complaint Chief Complaint  Patient presents with  . Cough  . DVT  . Shortness of Breath    HPI Natasha Rollins is a 56 y.o. female.  HPI  56 y.o. female with a hx of DM, HTN, presents to the Emergency Department today from PCP due to recent diagnosis of PE from [facility] as outpatient. Per patient, she was seen on the 24th of January due to URI symptoms. Supected acute bronchitis. Treated with Tessalon, Z-Pack and flonase without relief. Returned a few days later and diagnosed with Flu and treated with Tamiflu. Noted no relief even then and presented to UC yesterday and a d dimer was ordered due to tachycardia, which was elevated. Proceeded with CT Angio which was positive. Pt denies symptoms currently. No CP/SOB/ABD pain. No N/V/D.  Denies recent surgeries, travel, calf pain or swelling. No other symptoms noted.   Past Medical History:  Diagnosis Date  . Diabetes mellitus without complication ([place])   . Hypertension     Patient Active Problem List   Diagnosis Date Noted  . Diabetes ([place]) 12/22/2012  . Hypertension 12/22/2012  . Morbid obesity with BMI of 60.0-69.9, adult ([place]) 07/08/2010  . OVARIAN CYST 07/08/2010    Past Surgical History:  Procedure Laterality Date  . CHOLECYSTECTOMY    . HERNIA REPAIR    . TONSILLECTOMY      OB History    No data available       Home Medications    Prior to Admission medications   Medication Sig Start Date End Date Taking? Authorizing Provider  acetaminophen-codeine (TYLENOL #3) 300-30 MG tablet  05/07/16   Historical Provider, MD  amLODipine (NORVASC) 10 MG tablet Take 10 mg by mouth daily. 06/07/14   Historical Provider, MD  B-D UF III MINI PEN NEEDLES 31G X 5 MM MISC  07/28/14   Historical Provider, MD  benzonatate (TESSALON) 100 MG capsule Take 1-2 capsules (100-200 mg total) by mouth 3 (three) times daily as  needed for cough. 06/19/16   Wendie Agreste, MD  doxycycline (VIBRA-TABS) 100 MG tablet Take 1 tablet (100 mg total) by mouth 2 (two) times daily. 02/24/14   Gay Filler Copland, MD  fluticasone (FLONASE) 50 MCG/ACT nasal spray Place 2 sprays into both nostrils daily. 05/31/16   Gelene Mink McVey, PA-C  folic acid (FOLVITE) 1 MG tablet Take 1 mg by mouth daily.    Historical Provider, MD  LEVEMIR FLEXTOUCH 100 UNIT/ML Pen  07/28/14   Historical Provider, MD  levofloxacin (LEVAQUIN) 500 MG tablet Take 1 tablet (500 mg total) by mouth daily. 06/19/16   Wendie Agreste, MD  metFORMIN (GLUCOPHAGE-XR) 750 MG 24 hr tablet Take 750 mg by mouth daily with breakfast.  06/07/14   Historical Provider, MD  metroNIDAZOLE (METROGEL) 1 % gel Apply topically daily. Use as needed on face 02/24/14   Darreld Mclean, MD  NOVOLOG FLEXPEN 100 UNIT/ML FlexPen  07/28/14   Historical Provider, MD  TRADJENTA 5 MG TABS tablet Take 5 mg by mouth daily.  07/10/14   Historical Provider, MD  valsartan (DIOVAN) 320 MG tablet Take 320 mg by mouth daily.    Historical Provider, MD    Family History No family history on file.  Social History Social History  Substance Use Topics  . Smoking status: Never Smoker  . Smokeless tobacco: Never Used  .  Alcohol use No     Allergies   Penicillins   Review of Systems Review of Systems ROS reviewed and all are negative for acute change except as noted in the HPI.  Physical Exam Updated Vital Signs BP 147/80 (BP Location: Left Wrist)   Pulse 117   Temp 98 F (36.7 C) (Oral)   Resp 18   SpO2 100%   Physical Exam  Constitutional: She is oriented to person, place, and time. Vital signs are normal. She appears well-developed and well-nourished.  HENT:  Head: Normocephalic and atraumatic.  Right Ear: Hearing normal.  Left Ear: Hearing normal.  Eyes: Conjunctivae and EOM are normal. Pupils are equal, round, and reactive to light.  Neck: Normal range of motion. Neck  supple.  Cardiovascular: Regular rhythm, normal heart sounds and intact distal pulses.  Tachycardia present.   Pulmonary/Chest: Effort normal and breath sounds normal. No respiratory distress. She has no wheezes. She has no rales. She exhibits no tenderness.  Abdominal: Soft.  Musculoskeletal: Normal range of motion.  Neurological: She is alert and oriented to person, place, and time.  Skin: Skin is warm and dry.  Psychiatric: She has a normal mood and affect. Her speech is normal and behavior is normal. Thought content normal.  Nursing note and vitals reviewed.  ED Treatments / Results  Labs (all labs ordered are listed, but only abnormal results are displayed) Labs Reviewed  CBC - Abnormal; Notable for the following:       Result Value   WBC 16.4 (*)    Hemoglobin 11.3 (*)    HCT 35.3 (*)    All other components within normal limits  BASIC METABOLIC PANEL - Abnormal; Notable for the following:    Chloride 99 (*)    Glucose, Bld 142 (*)    BUN 21 (*)    All other components within normal limits  PROTIME-INR    EKG  EKG Interpretation  Date/Time:  Tuesday June 20 2016 20:03:30 EST Ventricular Rate:  113 PR Interval:  168 QRS Duration: 80 QT Interval:  344 QTC Calculation: 471 R Axis:   -22 Text Interpretation:  Sinus tachycardia with Premature atrial complexes with Abberant conduction Minimal voltage criteria for LVH, may be normal variant Premature atrial complexes Abnormal ekg Confirmed by Carmin Muskrat  MD [phone]) on 06/20/2016 8:23:58 PM       Radiology Dg Chest 2 View  Result Date: 06/19/2016 CLINICAL DATA:  56 year old presenting with cough and tachycardia. Current history of hypertension and diabetes. EXAM: CHEST  2 VIEW COMPARISON:  06/03/2016. FINDINGS: Cardiac silhouette normal in size, unchanged. Thoracic aorta mildly atherosclerotic, unchanged. Hilar and mediastinal contours otherwise unremarkable. Lungs clear. Bronchovascular markings normal. Pulmonary  vascularity normal. No visible pleural effusions. No pneumothorax. Mild degenerative changes involving the thoracic spine. IMPRESSION: 1.  No acute cardiopulmonary disease. 2. Mild thoracic aortic atherosclerosis. Electronically Signed   By: Evangeline Dakin M.D.   On: 06/19/2016 17:56   Ct Angio Chest W/cm &/or Wo Cm  Result Date: 06/20/2016 CLINICAL DATA:  Subacute onset of shortness of breath. Elevated D-dimer. Initial encounter. EXAM: CT ANGIOGRAPHY CHEST WITH CONTRAST TECHNIQUE: Multidetector CT imaging of the chest was performed using the standard protocol during bolus administration of intravenous contrast. Multiplanar CT image reconstructions and MIPs were obtained to evaluate the vascular anatomy. CONTRAST:  100 mL of Omnipaque 300 IV contrast COMPARISON:  Chest radiograph performed 06/19/2016 FINDINGS: Cardiovascular: Pulmonary embolus is noted within the left main pulmonary artery, extending into the left upper  and lower lobes. There is also pulmonary embolus within a segmental pulmonary artery branch to the right lower lobe. The RV/LV ratio is 0.85, without definite evidence for right heart strain. The heart is borderline normal in size. Mild calcification is noted along the aortic arch. The great vessels are grossly unremarkable in appearance. Mediastinum/Nodes: Visualized mediastinal nodes remain borderline normal in size. No pericardial effusion is identified. The thyroid gland is difficult to fully characterize, but appears grossly unremarkable. No axillary lymphadenopathy is seen. Lungs/Pleura: Minimal bibasilar atelectasis is noted. No pleural effusion or pneumothorax is seen. No masses are identified. Upper Abdomen: The visualized portions of the liver and spleen are grossly unremarkable. A likely fat-containing left adrenal adenoma is partially characterized. Musculoskeletal: No acute osseous abnormalities are identified. The visualized musculature is unremarkable in appearance. Review of the  MIP images confirms the above findings. IMPRESSION: 1. Pulmonary embolus within the left main pulmonary artery, extending into the left upper and lower lobes. Pulmonary embolus within a segmental pulmonary artery branch to the right lower lobe. RV/LV ratio of 0.85, without definite CT evidence for right heart strain at this time. 2. Minimal bibasilar atelectasis noted.  Lungs otherwise clear. 3. Likely fat-containing left adrenal adenoma partially characterized. Critical Value/emergent results were called by telephone at the time of interpretation on 06/20/2016 at 6:43 pm to Dr. [name], who verbally acknowledged these results. Electronically Signed   By: Garald Balding M.D.   On: 06/20/2016 18:45    Procedures Procedures (including critical care time)  Medications Ordered in ED Medications - No data to display   Initial Impression / Assessment and Plan / ED Course  I have reviewed the triage vital signs and the nursing notes.  Pertinent labs & imaging results that were available during my care of the patient were reviewed by me and considered in my medical decision making (see chart for details).  Final Clinical Impressions(s) / ED Diagnoses  {I have reviewed and evaluated the relevant laboratory values. {I have reviewed and evaluated the relevant imaging studies. {I have interpreted the relevant EKG. {I have reviewed the relevant previous healthcare records.  {I obtained HPI from historian. {Patient discussed with supervising physician.  ED Course:  Assessment: Pt is a 74yF with hx DM, HTN who presents with PE from PCP based on CT Angio today. Symptoms x 4 weeks with cough congestion. On exam, pt in NAD. Nontoxic/nonseptic appearing. VS with HR 117. No hypoxia. Normotensive. Afebrile. Lungs CTA.  Abdomen nontender soft. CBC unremarkable. BMP unremarkable. EKG unremarkable without right heart strain. CT Angio today as outpatient showed PE in left main pulmonary artery. Based on PESI score,  pt meets inpateint criteria for anticoagulation. Consult medicine for admission.  Given heparin in ED.   Disposition/Plan:  Admit Pt acknowledges and agrees with plan  Supervising Physician Carmin Muskrat, MD  Final diagnoses:  Other acute pulmonary embolism without acute cor pulmonale [facility])    New Prescriptions New Prescriptions   No medications on file     Shary Decamp, PA-C 06/20/16 2157    Carmin Muskrat, MD 06/25/16 [phone]

## 2016-06-20 NOTE — Progress Notes (Signed)
ANTICOAGULATION CONSULT NOTE - Initial Consult  Pharmacy Consult for Heparin Indication: pulmonary embolus  Allergies  Allergen Reactions  . Penicillins Itching and Rash    Patient Measurements: Weight: (!) 374 lb 12.5 oz (170 kg) Heparin Dosing Weight: 102 kg  Vital Signs: Temp: 98 F (36.7 C) (02/13 1958) Temp Source: Oral (02/13 1958) BP: 144/66 (02/13 2145) Pulse Rate: 92 (02/13 2145)  Labs:  Recent Labs  06/19/16 1748 06/20/16 2047  HGB 12.7 11.3*  HCT 35.3* 35.3*  PLT  --  330  LABPROT  --  13.4  INR  --  1.02  CREATININE  --  0.96    Estimated Creatinine Clearance: 104.8 mL/min (by C-G formula based on SCr of 0.96 mg/dL).   Medical History: Past Medical History:  Diagnosis Date  . Diabetes mellitus without complication ([place])   . Hypertension     Assessment: 56 year old obese female to begin heparin for new PE  Goal of Therapy:  Heparin level 0.3-0.7 units/ml Monitor platelets by anticoagulation protocol: Yes   Plan:  Heparin 5000 units iv bolus x 1 Heparin drip at 1800 units / hr Daily heparin level, CBC  Thank you Anette Guarneri, PharmD [phone]  Tad Moore 06/20/2016,10:02 PM

## 2016-06-21 ENCOUNTER — Observation Stay (HOSPITAL_BASED_OUTPATIENT_CLINIC_OR_DEPARTMENT_OTHER): Payer: BC Managed Care – PPO

## 2016-06-21 DIAGNOSIS — D72829 Elevated white blood cell count, unspecified: Secondary | ICD-10-CM | POA: Diagnosis not present

## 2016-06-21 DIAGNOSIS — I2699 Other pulmonary embolism without acute cor pulmonale: Secondary | ICD-10-CM | POA: Diagnosis not present

## 2016-06-21 DIAGNOSIS — Z794 Long term (current) use of insulin: Secondary | ICD-10-CM

## 2016-06-21 DIAGNOSIS — I10 Essential (primary) hypertension: Secondary | ICD-10-CM

## 2016-06-21 DIAGNOSIS — E118 Type 2 diabetes mellitus with unspecified complications: Secondary | ICD-10-CM | POA: Diagnosis not present

## 2016-06-21 LAB — ECHOCARDIOGRAM COMPLETE
Height: 64 in
Weight: 6105.86 oz

## 2016-06-21 LAB — HEPARIN LEVEL (UNFRACTIONATED)
HEPARIN UNFRACTIONATED: 0.52 [IU]/mL (ref 0.30–0.70)
HEPARIN UNFRACTIONATED: 0.52 [IU]/mL (ref 0.30–0.70)

## 2016-06-21 LAB — CBC
HEMATOCRIT: 32.5 % — AB (ref 36.0–46.0)
HEMOGLOBIN: 10.1 g/dL — AB (ref 12.0–15.0)
MCH: 27.4 pg (ref 26.0–34.0)
MCHC: 31.1 g/dL (ref 30.0–36.0)
MCV: 88.3 fL (ref 78.0–100.0)
Platelets: 297 10*3/uL (ref 150–400)
RBC: 3.68 MIL/uL — AB (ref 3.87–5.11)
RDW: 15.6 % — ABNORMAL HIGH (ref 11.5–15.5)
WBC: 15 10*3/uL — AB (ref 4.0–10.5)

## 2016-06-21 LAB — GLUCOSE, CAPILLARY
GLUCOSE-CAPILLARY: 247 mg/dL — AB (ref 65–99)
GLUCOSE-CAPILLARY: 319 mg/dL — AB (ref 65–99)
Glucose-Capillary: 271 mg/dL — ABNORMAL HIGH (ref 65–99)
Glucose-Capillary: 241 mg/dL — ABNORMAL HIGH (ref 65–99)
Glucose-Capillary: 264 mg/dL — ABNORMAL HIGH (ref 65–99)

## 2016-06-21 LAB — BASIC METABOLIC PANEL
ANION GAP: 14 (ref 5–15)
BUN: 21 mg/dL — ABNORMAL HIGH (ref 6–20)
CHLORIDE: 95 mmol/L — AB (ref 101–111)
CO2: 25 mmol/L (ref 22–32)
Calcium: 9.2 mg/dL (ref 8.9–10.3)
Creatinine, Ser: 1.01 mg/dL — ABNORMAL HIGH (ref 0.44–1.00)
GFR calc Af Amer: >60 mL/min (ref >60)
GLUCOSE: 302 mg/dL — AB (ref 65–99)
POTASSIUM: 3.8 mmol/L (ref 3.5–5.1)
Sodium: 134 mmol/L — ABNORMAL LOW (ref 135–145)

## 2016-06-21 LAB — TROPONIN I

## 2016-06-21 MED ORDER — INSULIN ASPART 100 UNIT/ML ~~LOC~~ SOLN
20.0000 [IU] | Freq: Three times a day (TID) | SUBCUTANEOUS | Status: DC
  Administered 2016-06-21 (×2): 20 [IU] via SUBCUTANEOUS

## 2016-06-21 MED ORDER — HYDROCHLOROTHIAZIDE 50 MG PO TABS
50.0000 mg | ORAL_TABLET | Freq: Every day | ORAL | Status: DC
  Administered 2016-06-21 – 2016-06-22 (×2): 50 mg via ORAL
  Filled 2016-06-21: qty 1
  Filled 2016-06-21: qty 2
  Filled 2016-06-21: qty 1
  Filled 2016-06-21: qty 2

## 2016-06-21 MED ORDER — RIVAROXABAN 15 MG PO TABS
15.0000 mg | ORAL_TABLET | Freq: Two times a day (BID) | ORAL | Status: DC
  Administered 2016-06-21 – 2016-06-22 (×3): 15 mg via ORAL
  Filled 2016-06-21 (×3): qty 1

## 2016-06-21 MED ORDER — HYDROCHLOROTHIAZIDE 25 MG PO TABS: 25.0000 mg | ORAL_TABLET | Freq: Every day | ORAL | Status: DC

## 2016-06-21 MED ORDER — INSULIN DETEMIR 100 UNIT/ML ~~LOC~~ SOLN
75.0000 [IU] | Freq: Two times a day (BID) | SUBCUTANEOUS | Status: DC
  Administered 2016-06-21 – 2016-06-22 (×4): 75 [IU] via SUBCUTANEOUS
  Filled 2016-06-21 (×5): qty 0.75

## 2016-06-21 MED ORDER — IRBESARTAN 300 MG PO TABS
300.0000 mg | ORAL_TABLET | Freq: Every day | ORAL | Status: DC
  Filled 2016-06-21: qty 1

## 2016-06-21 MED ORDER — FOLIC ACID 1 MG PO TABS
1.0000 mg | ORAL_TABLET | Freq: Every day | ORAL | Status: DC
  Administered 2016-06-21 – 2016-06-22 (×2): 1 mg via ORAL
  Filled 2016-06-21 (×2): qty 1

## 2016-06-21 MED ORDER — IRBESARTAN 300 MG PO TABS
300.0000 mg | ORAL_TABLET | Freq: Every day | ORAL | Status: DC
  Administered 2016-06-21 – 2016-06-22 (×2): 300 mg via ORAL
  Filled 2016-06-21 (×2): qty 1

## 2016-06-21 MED ORDER — PERFLUTREN LIPID MICROSPHERE
INTRAVENOUS | Status: AC
  Administered 2016-06-21: 2 mL
  Filled 2016-06-21: qty 10

## 2016-06-21 MED ORDER — INSULIN ASPART 100 UNIT/ML ~~LOC~~ SOLN
25.0000 [IU] | Freq: Three times a day (TID) | SUBCUTANEOUS | Status: DC
  Administered 2016-06-21 – 2016-06-22 (×4): 25 [IU] via SUBCUTANEOUS

## 2016-06-21 MED ORDER — VALSARTAN-HYDROCHLOROTHIAZIDE 320-25 MG PO TABS: 1.0000 | ORAL_TABLET | Freq: Every day | ORAL | Status: DC

## 2016-06-21 MED ORDER — INSULIN DETEMIR 100 UNIT/ML FLEXPEN: 50.0000 [IU] | PEN_INJECTOR | Freq: Two times a day (BID) | SUBCUTANEOUS | Status: DC

## 2016-06-21 MED ORDER — GUAIFENESIN ER 600 MG PO TB12
600.0000 mg | ORAL_TABLET | Freq: Two times a day (BID) | ORAL | Status: DC
  Administered 2016-06-21 – 2016-06-22 (×4): 600 mg via ORAL
  Filled 2016-06-21 (×4): qty 1

## 2016-06-21 MED ORDER — INSULIN ASPART 100 UNIT/ML ~~LOC~~ SOLN
0.0000 [IU] | Freq: Every day | SUBCUTANEOUS | Status: DC
  Administered 2016-06-21: 2 [IU] via SUBCUTANEOUS
  Administered 2016-06-21: 6 [IU] via SUBCUTANEOUS

## 2016-06-21 MED ORDER — BENZONATATE 100 MG PO CAPS
200.0000 mg | ORAL_CAPSULE | Freq: Three times a day (TID) | ORAL | Status: DC | PRN
Start: 2016-06-21 — End: 2016-06-22
  Administered 2016-06-22: 200 mg via ORAL
  Filled 2016-06-21: qty 2

## 2016-06-21 MED ORDER — AMLODIPINE BESYLATE 10 MG PO TABS: 10.0000 mg | ORAL_TABLET | Freq: Every day | ORAL | Status: DC

## 2016-06-21 MED ORDER — FLUTICASONE PROPIONATE 50 MCG/ACT NA SUSP
2.0000 | Freq: Every day | NASAL | Status: DC
  Administered 2016-06-21 – 2016-06-22 (×2): 2 via NASAL
  Filled 2016-06-21: qty 16

## 2016-06-21 MED ORDER — INSULIN ASPART 100 UNIT/ML ~~LOC~~ SOLN: 0.0000 [IU] | Freq: Three times a day (TID) | SUBCUTANEOUS | Status: DC

## 2016-06-21 MED ORDER — AMLODIPINE BESYLATE 10 MG PO TABS
10.0000 mg | ORAL_TABLET | Freq: Every day | ORAL | Status: DC
  Administered 2016-06-21 – 2016-06-22 (×2): 10 mg via ORAL
  Filled 2016-06-21 (×2): qty 1

## 2016-06-21 NOTE — Progress Notes (Signed)
ANTICOAGULATION CONSULT NOTE - Follow Up Consult  Pharmacy Consult for Heparin Indication: pulmonary embolus  Allergies  Allergen Reactions  . Penicillins Itching and Rash    Patient Measurements: Height: 5\' 4"  (162.6 cm) Weight: (!) 381 lb 9.9 oz (173.1 kg) IBW/kg (Calculated) : 54.7 Heparin Dosing Weight: 102 kg  Vital Signs: Temp: 98.3 F (36.8 C) (02/14 1310) Temp Source: Oral (02/14 1310) BP: 143/66 (02/14 1310) Pulse Rate: 94 (02/14 1310)  Labs:  Recent Labs  06/19/16 1748 06/20/16 2047 06/20/16 2340 06/21/16 0437 06/21/16 0653 06/21/16 1238  HGB 12.7 11.3*  --  10.1*  --   --   HCT 35.3* 35.3*  --  32.5*  --   --   PLT  --  330  --  297  --   --   LABPROT  --  13.4  --   --   --   --   INR  --  1.02  --   --   --   --   HEPARINUNFRC  --   --   --   --  0.52 0.52  CREATININE  --  0.96  --  1.01*  --   --   TROPONINI  --   --  <0.03  --   --   --     Estimated Creatinine Clearance: 100.2 mL/min (by C-G formula based on SCr of 1.01 mg/dL (H)).   Assessment: Heparin for new PE, HL 0.52 x 2 in goal range. Deciding on long-term oral anticoagulation.  Goal of Therapy:  Heparin level 0.3-0.7 units/ml Monitor platelets by anticoagulation protocol: Yes   Plan:  - Heparin drip at 1800 units / hr - Daily heparin level and CBC   Kiari Hosmer S. Alford Highland, PharmD, BCPS Clinical Staff Pharmacist Pager [phone]  Eilene Ghazi Stillinger 06/21/2016,2:03 PM

## 2016-06-21 NOTE — Significant Event (Signed)
Patient express urgency of getting the ultrasounds done today and wants RN to call ultrasound to see why the delay. Patient had ECHO already, awaiting for vascular US of lower extremities. Spoke with Vascular US who stated that they have her on schedule for tomorrow.   Patient told RN that she is told by MD that she needed it done prior to discharge today. RN called vascular US and got them to send a tech to do the studies now. Patient also express needing to switch to oral anticoagulation. Patient express these concerns to this RN as well as unit Director Joy when she rounded. RN paged and spoke with MD Hongalgi to let him aware of these. Per MD, patient is not being discharge today like patient told RN and patient was told this morning that potential discharge may be tomorrow. MD spoke with patient via phone to clarify instructions. Patient tells RN now that she is aware of the plan that she is not going home today.     Natasha Rollins

## 2016-06-21 NOTE — Progress Notes (Signed)
ANTICOAGULATION CONSULT NOTE - Follow Up Consult  Pharmacy Consult for heparin Indication: pulmonary embolus  Labs:  Recent Labs  06/19/16 1748 06/20/16 2047 06/20/16 2340 06/21/16 0437 06/21/16 0653  HGB 12.7 11.3*  --  10.1*  --   HCT 35.3* 35.3*  --  32.5*  --   PLT  --  330  --  297  --   LABPROT  --  13.4  --   --   --   INR  --  1.02  --   --   --   HEPARINUNFRC  --   --   --   --  0.52  CREATININE  --  0.96  --  1.01*  --   TROPONINI  --   --  <0.03  --   --     Assessment/Plan:  56yo female therapeutic on heparin with initial dosing for PE. Will continue gtt at current rate and confirm stable with additional level.   Wynona Neat, PharmD, BCPS  06/21/2016,7:39 AM

## 2016-06-21 NOTE — Discharge Instructions (Addendum)
Information on my medicine - XARELTO (rivaroxaban)  This medication education was reviewed with me or my healthcare representative as part of my discharge preparation.  The pharmacist that spoke with me during my hospital stay was:  Saundra Shelling, [place] Xarelto was prescribed to treat blood clots that may have been found in the veins of your legs (deep vein thrombosis) or in your lungs (pulmonary embolism) and to reduce the risk of them occurring again.  What do you need to know about Xarelto? The starting dose is one 15 mg tablet taken TWICE daily with food for the FIRST 21 DAYS then on (enter date)  07/12/16  the dose is changed to one 20 mg tablet taken ONCE A DAY with your evening meal.  DO NOT stop taking Xarelto without talking to the health care provider who prescribed the medication.  Refill your prescription for 20 mg tablets before you run out.  After discharge, you should have regular check-up appointments with your healthcare provider that is prescribing your Xarelto.  In the future your dose may need to be changed if your kidney function changes by a significant amount.  What do you do if you miss a dose? If you are taking Xarelto TWICE DAILY and you miss a dose, take it as soon as you remember. You may take two 15 mg tablets (total 30 mg) at the same time then resume your regularly scheduled 15 mg twice daily the next day.  If you are taking Xarelto ONCE DAILY and you miss a dose, take it as soon as you remember on the same day then continue your regularly scheduled once daily regimen the next day. Do not take two doses of Xarelto at the same time.   Important Safety Information Xarelto is a blood thinner medicine that can cause bleeding. You should call your healthcare provider right away if you experience any of the following: ? Bleeding from an injury or your nose that does not stop. ? Unusual colored urine (red or dark brown) or  unusual colored stools (red or black). ? Unusual bruising for unknown reasons. ? A serious fall or if you hit your head (even if there is no bleeding).  Some medicines may interact with Xarelto and might increase your risk of bleeding while on Xarelto. To help avoid this, consult your healthcare provider or pharmacist prior to using any new prescription or non-prescription medications, including herbals, vitamins, non-steroidal anti-inflammatory drugs (NSAIDs) and supplements.  This website has more information on Xarelto: [email or web address]    Venous Thromboembolism Venous thromboembolism (VTE) is a condition in which a blood clot (thrombus) develops in the body. A thrombus usually occurs in a deep vein in the leg or the pelvis, but it can also occur in the arm. Sometimes, pieces of a thrombus can break off from its original place of development and travel through the bloodstream to other parts of the body. When that happens, the thrombus is called an embolism. An embolism can block the blood flow in the blood vessels of other organs. There are two serious types of VTE:  Deep vein thrombosis (DVT). A DVT is a thrombus that usually occurs in a deep, larger vein of the lower leg or the pelvis, or in an upper extremity such as the arm.  Pulmonary embolism (PE). A PE occurs when an embolism has formed and traveled to the lungs. A PE can block or decrease the blood flow in one lung or  both lungs. VTE is a serious health condition that can cause disability or death. It is very important to get help right away and to not ignore symptoms. What are the causes? VTE is caused by the formation of a blood clot in your leg, pelvis, or arm. Usually, several things contribute to the formation of blood clots. A clot may develop when:  Your blood flow slows down.  Your vein becomes damaged in some way.  You have a condition that makes your blood clot more easily. What increases the risk? A VTE is more  likely to develop in:  People who are older, especially over 75 years of age.  People who are overweight (obese).  People who sit or lie still for a long time, such as during long-distance travel (over 4 hours), bed rest, hospitalization, or during recovery from certain medical conditions like a stroke.  People who do not engage in much physical activity (sedentary lifestyle).  People who have chronic breathing disorders.  People who have a personal or family history of blood clots or blood clotting disease.  People who have peripheral vascular disease (PVD), diabetes, or some types of cancer.  People who have heart disease, especially if the person had a recent heart attack or has congestive heart failure.  People who have neurological diseases that affect the legs (leg paresis).  People who have had a traumatic injury, such as breaking a hip or leg.  People who have recently had major or lengthy surgery, especially on the hip, knee, or abdomen.  People who have had a central line placed inside a large vein.  People who take medicines that contain the hormone estrogen. These include birth control pills and hormone replacement therapy.  Pregnancy or during childbirth or the postpartum period.  Long plane flights (over 8 hours). What are the signs or symptoms? Symptoms of VTE can depend on where the clot is located and whether the clot breaks off and travels to another organ. Sometimes, there may be no symptoms. Symptoms of a DVT can include:  Swelling of your leg or arm, especially if one side is much worse.  Warmth and redness of your leg or arm, especially if one side is much worse.  Pain in your arm or leg. If the clot is in your leg, symptoms may be more noticeable or worse when you stand or walk.  A feeling of pins and needles if the clot is in the arm. The symptoms of a PE usually start suddenly and include:  Shortness of breath while active or at rest.  Coughing  or coughing up blood or blood-tinged mucus.  Chest pain that is often worse with deep breaths.  Rapid or irregular heartbeat.  Feeling light-headed or dizzy.  Fainting.  Feeling anxious.  Sweating. There may also be pain and swelling in a leg if that is where the blood clot started. These symptoms may represent a serious problem that is an emergency. Do not wait to see if the symptoms will go away. Get medical help right away. Call your local emergency services (911 in the [place] Do not drive yourself to the hospital.  How is this diagnosed? Your health care provider will take a medical history and perform a physical exam. You may also have other tests, including:  Blood tests to assess the clotting properties of your blood.  Imaging tests, such as CT, ultrasound, MRI, X-ray, and other tests to see if you have clots anywhere in your body.  An  electrocardiogram (ECG) to look for heart strain from blood clots in the lungs.  An echocardiogram. How is this treated? After a VTE is identified, it can be treated. The main goals of treatment are:  To stop a blood clot from growing larger.  To stop new blood clots from forming.  To stop a blood clot from traveling to the lungs (pulmonary embolism). The type of treatment that you receive depends on many factors, such as the cause of your VTE, your risk for bleeding or developing more clots, and other medical conditions that you have. Sometimes, a combination of treatments is necessary. Treatment options may be combined and include:  Monitoring the blood clot with ultrasound.  Taking medicines by mouth, such as newer blood thinners (anticoagulants), thrombolytics, or warfarin.  Taking anticoagulant medicine by injection or through an IV tube.  Wearingcompression stockings or using different types of devices.  Surgery (rare) to remove the blood clot or to place a filter in your abdomen to stop the blood clot from traveling to your  lungs. Treatments for VTE are often divided into immediate treatment and long-term treatment (up to 3 months after VTE). You can work with your health care provider to choose the treatment program that is best for you. Follow these instructions at home: If you are taking a newer oral anticoagulant:  Take the medicine every single day at the same time each day.  Understand what foods and drugs interact with this medicine.  Understand that there are no regular blood tests required when using this medicine.  Understand the side effects of this medicine, including excessive bruising or bleeding. Ask your health care provider or pharmacist about other possible side effects. If you are taking warfarin:  Understand how to take warfarin and know which foods can affect how warfarin works in Veterinary surgeon.  Understand that it is dangerous to take too much or too little warfarin. Too much warfarin increases the risk of bleeding. Too little warfarin continues to allow the risk for blood clots.  Follow your PT and INR blood testing schedule. The PT and INR results allow your health care provider to adjust your dose of warfarin. It is very important that you have your PT and INR tested as often as told by your health care provider.  Avoid major changes in your diet, or tell your health care provider before you change your diet. Arrange a visit with a registered dietitian to answer your questions. Many foods, especially foods that are high in vitamin K, can interfere with warfarin and affect the PT and INR results. Eat a consistent amount of foods that are high in vitamin K, such as:  Spinach, kale, broccoli, cabbage, collard greens, turnip greens, Brussels sprouts, peas, cauliflower, seaweed, and parsley.  Beef liver and pork liver.  Green tea.  Soybean oil.  Tell your health care provider about any and all medicines, vitamins, and supplements that you take, including aspirin and other over-the-counter  anti-inflammatory medicines. Be especially cautious with aspirin and anti-inflammatory medicines. Do not take those before you ask your health care provider if it is safe to do so. This is important because many medicines can interfere with warfarin and affect the PT and INR results.  Do not start or stop taking any over-the-counter or prescription medicine unless your health care provider or pharmacist tells you to do so. If you take warfarin, you will also need to do these things:  Hold pressure over cuts for longer than usual.  Tell  your dentist and other health care providers that you are taking warfarin before you have any procedures in which bleeding may occur.  Avoid alcohol or drink very small amounts. Tell your health care provider if you change your alcohol intake.  Do not use tobacco products, including cigarettes, chewing tobacco, and e-cigarettes. If you need help quitting, ask your health care provider.  Avoid contact sports. General instructions  Take over-the-counter and prescription medicines only as told by your health care provider. Anticoagulant medicines can have side effects, including easy bruising and difficulty stopping bleeding. If you are prescribed an anticoagulant, you will also need to do these things:  Hold pressure over cuts for longer than usual.  Tell your dentist and other health care providers that you are taking anticoagulants before you have any procedures in which bleeding may occur.  Avoid contact sports.  Wear a medical alert bracelet or carry a medical alert card that says you have had a PE.  Ask your health care provider how soon you can go back to your normal activities. Stay active to prevent new blood clots from forming.  Make sure to exercise while traveling or when you have been sitting or standing for a long period of time. It is very important to exercise. Exercise your legs by walking or by tightening and relaxing your leg muscles often.  Take frequent walks.  Wear compression stockings as told by your health care provider to help prevent more blood clots from forming.  Do not use tobacco products, including cigarettes, chewing tobacco, and e-cigarettes. If you need help quitting, ask your health care provider.  Keep all follow-up appointments with your health care provider. This is important. How is this prevented? Take these actions to decrease your risk of developing another VTE:  Exercise regularly. For at least 30 minutes every day, engage in:  Activity that involves moving your arms and legs.  Activity that encourages good blood flow through your body by increasing your heart rate.  Exercise your arms and legs every hour during long-distance travel (over 4 hours). Drink plenty of water and avoid drinking alcohol while traveling.  Avoid sitting or lying in bed for long periods of time without moving your legs.  Maintain a weight that is appropriate for your height. Ask your health care provider what weight is healthy for you.  If you are a woman who is over 54 years of age, avoid unnecessary use of medicines that contain estrogen. These include birth control pills.  Do not smoke, especially if you take estrogen medicines. If you need help quitting, ask your health care provider. If you are hospitalized, prevention measures may include:  Early walking after surgery, as soon as your health care provider says that it is safe.  Receiving anticoagulants to prevent blood clots.If you cannot take anticoagulants, other options may be available, such as wearing compression stockings or using different types of devices. Get help right away if:  You have new or increased pain, swelling, or redness in an arm or leg.  You have numbness or tingling in an arm or leg.  You have shortness of breath while active or at rest.  You have chest pain.  You have a rapid or irregular heartbeat.  You feel light-headed or  dizzy.  You cough up blood.  You notice blood in your vomit, bowel movement, or urine. These symptoms may represent a serious problem that is an emergency. Do not wait to see if the symptoms will go away.  Get medical help right away. Call your local emergency services (911 in the [place] Do not drive yourself to the hospital.  This information is not intended to replace advice given to you by your health care provider. Make sure you discuss any questions you have with your health care provider. Document Released: 02/19/2009 Document Revised: 10/06/2015 Document Reviewed: 08/19/2014 Elsevier Interactive Patient Education  2017 [employer].   Additional Discharge Instructions:  Please get your medications reviewed and adjusted by your Primary MD.  Please request your Primary MD to go over all Hospital Tests and Procedure/Radiological results at the follow up, please get all Hospital records sent to your Prim MD by signing hospital release before you go home.  If you had Pneumonia of Lung problems at the Hospital: Please get a 2 view Chest X ray done in 6-8 weeks after hospital discharge or sooner if instructed by your Primary MD.  If you have Congestive Heart Failure: Please call your Cardiologist or Primary MD anytime you have any of the following symptoms:  1) 3 pound weight gain in 24 hours or 5 pounds in 1 week  2) shortness of breath, with or without a dry hacking cough  3) swelling in the hands, feet or stomach  4) if you have to sleep on extra pillows at night in order to breathe  Follow cardiac low salt diet and 1.5 lit/day fluid restriction.  If you have diabetes Accuchecks 4 times/day, Once in AM empty stomach and then before each meal. Log in all results and show them to your primary doctor at your next visit. If any glucose reading is under 80 or above 300 call your primary MD immediately.  If you have Seizure/Convulsions/Epilepsy: Please do not drive, operate heavy  machinery, participate in activities at heights or participate in high speed sports until you have seen by Primary MD or a Neurologist and advised to do so again.  If you had Gastrointestinal Bleeding: Please ask your Primary MD to check a complete blood count within one week of discharge or at your next visit. Your endoscopic/colonoscopic biopsies that are pending at the time of discharge, will also need to followed by your Primary MD.  Get Medicines reviewed and adjusted. Please take all your medications with you for your next visit with your Primary MD  Please request your Primary MD to go over all hospital tests and procedure/radiological results at the follow up, please ask your Primary MD to get all Hospital records sent to his/her office.  If you experience worsening of your admission symptoms, develop shortness of breath, life threatening emergency, suicidal or homicidal thoughts you must seek medical attention immediately by calling 911 or calling your MD immediately  if symptoms less severe.  You must read complete instructions/literature along with all the possible adverse reactions/side effects for all the Medicines you take and that have been prescribed to you. Take any new Medicines after you have completely understood and accpet all the possible adverse reactions/side effects.   Do not drive or operate heavy machinery when taking Pain medications.   Do not take more than prescribed Pain, Sleep and Anxiety Medications  Special Instructions: If you have smoked or chewed Tobacco  in the last 2 yrs please stop smoking, stop any regular Alcohol  and or any Recreational drug use.  Wear Seat belts while driving.  Please note You were cared for by a hospitalist during your hospital stay. If you have any questions about your discharge medications or the  care you received while you were in the hospital after you are discharged, you can call the unit and asked to speak with the hospitalist  on call if the hospitalist that took care of you is not available. Once you are discharged, your primary care physician will handle any further medical issues. Please note that NO REFILLS for any discharge medications will be authorized once you are discharged, as it is imperative that you return to your primary care physician (or establish a relationship with a primary care physician if you do not have one) for your aftercare needs so that they can reassess your need for medications and monitor your lab values.  You can reach the hospitalist office at phone [phone] or fax [phone]   If you do not have a primary care physician, you can call [phone] for a physician referral.

## 2016-06-21 NOTE — Progress Notes (Addendum)
  Echocardiogram 2D Echocardiogram with Definity has been performed.  Diamond Nickel 06/21/2016, 12:42 PM

## 2016-06-21 NOTE — Progress Notes (Addendum)
PROGRESS NOTE  Natasha Rollins  [id number] DOB: [birth year]  DOA: 06/20/2016 PCP: Lilian Coma, MD   Brief Narrative:  56 year old female with PMH of type II DM, HTN, morbid obesity, mostly sedentary lifestyle, bilateral knee pain status post injections by orthopedics, failed outpatient treatment of presumed bronchitis with Z-Pak, Tessalon, Flonase, Tamiflu, levofloxacin, subsequently checked for d-dimer which was elevated and sent for CTA chest which confirmed acute PE, started on IV heparin and admitted for further evaluation. No personal or family history of VTE.   Assessment & Plan:   Principal Problem:   Pulmonary emboli (HCC) Active Problems:   Diabetes (HCC)   Hypertension   Leukocytosis   1. Acute PE: CTA chest 06/20/16 confirmed PE within the left main pulmonary artery, extending into the left upper and lower lobes and within the segmental pulmonary artery branch to the right lower lobe. No CT evidence of right heart strain. No personal or family history of VTE. No personal history of cancer or OCP use or long distance travel. However gives history of relative immobility for 2 weeks after some form of gel injection into bilateral knees on 04/21/16 when she had severe bilateral leg pains. Sedentary lifestyle. Started on IV heparin. 2-D echo does not show right heart strain. Discussed in detail regarding options of anticoagulation including warfarin versus NOAC's, risks and benefits. Pharmacy also discussed with her regarding this. She opted Xarelto and will be transitioned to same. Status post recent steroid injection in left knee. Given history of lower extremity symptoms, we will obtain lower extremity venous Dopplers to check for DVT. Duration of anticoagulation at least 6 months but to be further determined by her PCP during outpatient follow-up. 2. Mild leukocytosis: No clinical focus of sepsis.? Stress response to problem #1. Follow CBC again in a.m. 3. Essential  hypertension: Controlled. Continue amlodipine and Diovan/HCT. 4. Type II DM: Patient was placed on reduced dose of Levemir and NovoLog. Tradjenta was held. Adjust insulins. 5. Left adrenal adenoma: Seen on CTA chest. Outpatient follow-up as deemed necessary. 6. Morbid obesity/Body mass index is 65.5 kg/m.    DVT prophylaxis: Was on IV heparin infusion per pharmacy. Will switch to Xarelto Code Status: Full Family Communication: None at bedside Disposition Plan: Due to extent of PE and pending LE venous dopplers, monitor overnight and DC Home likely 06/22/2016   Consultants:   None  Procedures:   2-D echo 06/21/16: Study Conclusions  - Left ventricle: The cavity size was normal. There was moderate   concentric hypertrophy. Systolic function was normal. The   estimated ejection fraction was in the range of 60% to 65%. Wall   motion was normal; there were no regional wall motion   abnormalities. Left ventricular diastolic function parameters   were normal. - Aortic valve: Transvalvular velocity was within the normal range.   There was no stenosis. There was no regurgitation. - Mitral valve: Transvalvular velocity was within the normal range.   There was no evidence for stenosis. There was no regurgitation. - Right ventricle: The cavity size was normal. Wall thickness was   normal. Systolic function was normal. - Tricuspid valve: There was trivial regurgitation.  Antimicrobials:   None    Subjective: Seen this morning. Denies chest pain or dyspnea. Denies any lower extremity asymmetric swelling or pain.  Objective:  Vitals:   06/20/16 2317 06/21/16 0447 06/21/16 0500 06/21/16 1310  BP: 100/66 (!) 119/40  (!) 143/66  Pulse: (!) 105 96  94  Resp: 18 18  18  Temp: 98.1 F (36.7 C) 98.6 F (37 C)  98.3 F (36.8 C)  TempSrc: Oral Oral  Oral  SpO2: 95% 94%  100%  Weight:   (!) 173.1 kg (381 lb 9.9 oz)   Height: 5\' 4"  (1.626 m)       Intake/Output Summary (Last 24  hours) at 06/21/16 1600 Last data filed at 06/21/16 1230  Gross per 24 hour  Intake            962.1 ml  Output                0 ml  Net            962.1 ml   Filed Weights   06/20/16 2200 06/21/16 0500  Weight: (!) 170 kg (374 lb 12.5 oz) (!) 173.1 kg (381 lb 9.9 oz)    Examination:  General exam: Moderately built and morbidly obese female sitting up comfortably in bed. Respiratory system: Clear to auscultation. Respiratory effort normal. Cardiovascular system: S1 & S2 heard, RRR. No JVD, murmurs, rubs, gallops or clicks. No pedal edema. Telemetry: Sinus rhythm. Gastrointestinal system: Abdomen is nondistended, soft and nontender. No organomegaly or masses felt. Normal bowel sounds heard. Central nervous system: Alert and oriented. No focal neurological deficits. Extremities: Symmetric 5 x 5 power. Skin: No rashes, lesions or ulcers Psychiatry: Judgement and insight appear normal. Mood & affect appropriate.     Data Reviewed: I have personally reviewed following labs and imaging studies  CBC:  Recent Labs Lab 06/19/16 1748 06/20/16 2047 06/21/16 0437  WBC 14.1* 16.4* 15.0*  HGB 12.7 11.3* 10.1*  HCT 35.3* 35.3* 32.5*  MCV 84.6 87.6 88.3  PLT  --  330 [id number]   Basic Metabolic Panel:  Recent Labs Lab 06/20/16 2047 06/21/16 0437  NA 138 134*  K 3.6 3.8  CL 99* 95*  CO2 29 25  GLUCOSE 142* 302*  BUN 21* 21*  CREATININE 0.96 1.01*  CALCIUM 10.0 9.2   GFR: Estimated Creatinine Clearance: 100.2 mL/min (by C-G formula based on SCr of 1.01 mg/dL (H)). Liver Function Tests: No results for input(s): AST, ALT, ALKPHOS, BILITOT, PROT, ALBUMIN in the last 168 hours. No results for input(s): LIPASE, AMYLASE in the last 168 hours. No results for input(s): AMMONIA in the last 168 hours. Coagulation Profile:  Recent Labs Lab 06/20/16 2047  INR 1.02   Cardiac Enzymes:  Recent Labs Lab 06/20/16 2340  TROPONINI <0.03   BNP (last 3 results) No results for input(s):  PROBNP in the last 8760 hours. HbA1C: No results for input(s): HGBA1C in the last 72 hours. CBG:  Recent Labs Lab 06/21/16 0032 06/21/16 0625 06/21/16 1117  GLUCAP 319* 271* 241*   Lipid Profile: No results for input(s): CHOL, HDL, LDLCALC, TRIG, CHOLHDL, LDLDIRECT in the last 72 hours. Thyroid Function Tests:  Recent Labs  06/19/16 1738  TSH 0.720   Anemia Panel: No results for input(s): VITAMINB12, FOLATE, FERRITIN, TIBC, IRON, RETICCTPCT in the last 72 hours.  Sepsis Labs:  Recent Labs Lab 06/19/16 1748 06/20/16 2047 06/21/16 0437  WBC 14.1* 16.4* 15.0*    No results found for this or any previous visit (from the past 240 hour(s)).       Radiology Studies: Dg Chest 2 View  Result Date: 06/19/2016 CLINICAL DATA:  56 year old presenting with cough and tachycardia. Current history of hypertension and diabetes. EXAM: CHEST  2 VIEW COMPARISON:  06/03/2016. FINDINGS: Cardiac silhouette normal in size, unchanged. Thoracic aorta mildly atherosclerotic, unchanged.  Hilar and mediastinal contours otherwise unremarkable. Lungs clear. Bronchovascular markings normal. Pulmonary vascularity normal. No visible pleural effusions. No pneumothorax. Mild degenerative changes involving the thoracic spine. IMPRESSION: 1.  No acute cardiopulmonary disease. 2. Mild thoracic aortic atherosclerosis. Electronically Signed   By: Evangeline Dakin M.D.   On: 06/19/2016 17:56   Ct Angio Chest W/cm &/or Wo Cm  Result Date: 06/20/2016 CLINICAL DATA:  Subacute onset of shortness of breath. Elevated D-dimer. Initial encounter. EXAM: CT ANGIOGRAPHY CHEST WITH CONTRAST TECHNIQUE: Multidetector CT imaging of the chest was performed using the standard protocol during bolus administration of intravenous contrast. Multiplanar CT image reconstructions and MIPs were obtained to evaluate the vascular anatomy. CONTRAST:  100 mL of Omnipaque 300 IV contrast COMPARISON:  Chest radiograph performed 06/19/2016  FINDINGS: Cardiovascular: Pulmonary embolus is noted within the left main pulmonary artery, extending into the left upper and lower lobes. There is also pulmonary embolus within a segmental pulmonary artery branch to the right lower lobe. The RV/LV ratio is 0.85, without definite evidence for right heart strain. The heart is borderline normal in size. Mild calcification is noted along the aortic arch. The great vessels are grossly unremarkable in appearance. Mediastinum/Nodes: Visualized mediastinal nodes remain borderline normal in size. No pericardial effusion is identified. The thyroid gland is difficult to fully characterize, but appears grossly unremarkable. No axillary lymphadenopathy is seen. Lungs/Pleura: Minimal bibasilar atelectasis is noted. No pleural effusion or pneumothorax is seen. No masses are identified. Upper Abdomen: The visualized portions of the liver and spleen are grossly unremarkable. A likely fat-containing left adrenal adenoma is partially characterized. Musculoskeletal: No acute osseous abnormalities are identified. The visualized musculature is unremarkable in appearance. Review of the MIP images confirms the above findings. IMPRESSION: 1. Pulmonary embolus within the left main pulmonary artery, extending into the left upper and lower lobes. Pulmonary embolus within a segmental pulmonary artery branch to the right lower lobe. RV/LV ratio of 0.85, without definite CT evidence for right heart strain at this time. 2. Minimal bibasilar atelectasis noted.  Lungs otherwise clear. 3. Likely fat-containing left adrenal adenoma partially characterized. Critical Value/emergent results were called by telephone at the time of interpretation on 06/20/2016 at 6:43 pm to Dr. [name], who verbally acknowledged these results. Electronically Signed   By: Garald Balding M.D.   On: 06/20/2016 18:45        Scheduled Meds: . amLODipine  10 mg Oral Daily  . fluticasone  2 spray Each Nare Daily    . folic acid  1 mg Oral Daily  . guaiFENesin  600 mg Oral BID  . hydrochlorothiazide  50 mg Oral Daily  . insulin aspart  0-8 Units Subcutaneous QHS  . insulin aspart  20 Units Subcutaneous TID WC  . insulin detemir  75 Units Subcutaneous BID  . irbesartan  300 mg Oral Daily   Continuous Infusions: . heparin 1,800 Units/hr (06/21/16 1015)     LOS: 0 days     [facility], MD [facility] Pager [phone]  If 7PM-7AM, please contact night-coverage [email or web address] Password [email or web address] 06/21/2016, 4:00 PM

## 2016-06-21 NOTE — Progress Notes (Signed)
Per Clinical biochemist for Xarelto [facility] [phone]   XARELTO 20 MG  DAILY   COVER- YES  CO-PAY - $ 30.00  PRIOR APPROVAL - NO  [facility]  MAIL -ORDER FOR 90 DAY SUPPLY $ 90.00  RETAIL FOR 90 DAY SUPPLY $ 90.00

## 2016-06-21 NOTE — Progress Notes (Addendum)
Inpatient Diabetes Program Recommendations  AACE/ADA: New Consensus Statement on Inpatient Glycemic Control (2015)  Target Ranges:  Prepandial:   less than 140 mg/dL      Peak postprandial:   less than 180 mg/dL (1-2 hours)      Critically ill patients:  140 - 180 mg/dL   Lab Results  Component Value Date   GLUCAP 241 (H) 06/21/2016    Review of Glycemic Control  Diabetes history:     dm2, morbid obesity Outpatient Diabetes medications:     Levemir 75 units bid,    Novolog 30 units tidac,    Tradjenta 5 mg daily Current orders for Inpatient glycemic control:     Novolog 0-8 units qhs,    Novolog 20 units tidac,    Levemir 75 units bid  Inpatient Diabetes Program Recommendations:    Please consider correction scale Novolog 0-8 units TIDAC and QHS.    Per ADA recommendations "consider performing an A1C on all patients with diabetes or hyperglycemia admitted to the hospital if not performed in the prior 3 months".  Thank you,  Windy Carina, RN, MSN Diabetes Coordinator Inpatient Diabetes Program [phone] (Team Pager)

## 2016-06-21 NOTE — Progress Notes (Signed)
**  Preliminary report by tech**  Bilateral lower extremity venous duplex complete. There is evidence of acute deep vein thrombosis involving the popliteal vein of the right lower extremity. There is no evidence of superficial vein thrombosis of the right lower extremity. There is evidence of acute deep vein thrombosis involving the posterior tibial veins of the left lower extremity. There is no evidence of superficial vein thrombosis of the left lower extremity. Incidental findings of a non-vascularized, anechoic structure in the right popliteal fossa measuring 2.7 cm high by 1.7 cm wide by 3.9 cm long. There is another incidental finding of a non-vascularized, anechoic structure in the left popliteal fossa measuring 1.4 cm high by 3.6 cm wide by 3.6 cm long. Results were given to the patient's nurse, Hyiu.  06/21/16 5:22 PM Natasha Rollins RVT

## 2016-06-21 NOTE — Progress Notes (Signed)
ANTICOAGULATION CONSULT NOTE - Follow Up Consult  Pharmacy Consult for Heparin--> transitioning to Xarelto Indication: pulmonary embolus  Allergies  Allergen Reactions  . Penicillins Itching and Rash    Patient Measurements: Height: 5\' 4"  (162.6 cm) Weight: (!) 381 lb 9.9 oz (173.1 kg) IBW/kg (Calculated) : 54.7 Heparin Dosing Weight: 102 kg  Vital Signs: Temp: 98.3 F (36.8 C) (02/14 1310) Temp Source: Oral (02/14 1310) BP: 143/66 (02/14 1310) Pulse Rate: 94 (02/14 1310)  Labs:  Recent Labs  06/19/16 1748 06/20/16 2047 06/20/16 2340 06/21/16 0437 06/21/16 0653 06/21/16 1238  HGB 12.7 11.3*  --  10.1*  --   --   HCT 35.3* 35.3*  --  32.5*  --   --   PLT  --  330  --  297  --   --   LABPROT  --  13.4  --   --   --   --   INR  --  1.02  --   --   --   --   HEPARINUNFRC  --   --   --   --  0.52 0.52  CREATININE  --  0.96  --  1.01*  --   --   TROPONINI  --   --  <0.03  --   --   --     Estimated Creatinine Clearance: 100.2 mL/min (by C-G formula based on SCr of 1.01 mg/dL (H)).   Assessment: Heparin for new PE, now transitioning to Xarelto   Goal of Therapy:   Monitor platelets by anticoagulation protocol: Yes   Plan:  DC heparin and heparin labs Xarelto 15 mg po BID x 3 weeks then 20 mg po daily  Thank you Anette Guarneri, PharmD [phone]  06/21/2016,4:18 PM

## 2016-06-22 DIAGNOSIS — I2699 Other pulmonary embolism without acute cor pulmonale: Secondary | ICD-10-CM | POA: Diagnosis not present

## 2016-06-22 DIAGNOSIS — D72829 Elevated white blood cell count, unspecified: Secondary | ICD-10-CM | POA: Diagnosis not present

## 2016-06-22 DIAGNOSIS — I10 Essential (primary) hypertension: Secondary | ICD-10-CM | POA: Diagnosis not present

## 2016-06-22 DIAGNOSIS — E118 Type 2 diabetes mellitus with unspecified complications: Secondary | ICD-10-CM | POA: Diagnosis not present

## 2016-06-22 LAB — GLUCOSE, CAPILLARY
GLUCOSE-CAPILLARY: 220 mg/dL — AB (ref 65–99)
Glucose-Capillary: 288 mg/dL — ABNORMAL HIGH (ref 65–99)
Glucose-Capillary: 233 mg/dL — ABNORMAL HIGH (ref 65–99)

## 2016-06-22 MED ORDER — RIVAROXABAN 20 MG PO TABS: 20.0000 mg | ORAL_TABLET | Freq: Every day | ORAL | Status: DC

## 2016-06-22 MED ORDER — RIVAROXABAN (XARELTO) VTE STARTER PACK (15 & 20 MG): ORAL_TABLET | ORAL | 0 refills | Status: DC

## 2016-06-22 MED ORDER — ACETAMINOPHEN 325 MG PO TABS: 650.0000 mg | ORAL_TABLET | Freq: Four times a day (QID) | ORAL | Status: DC | PRN

## 2016-06-22 NOTE — Care Management Note (Signed)
Case Management Note Marvetta Gibbons RN, BSN Unit 2W-Case Manager [phone]  Patient Details  Name: Natasha Rollins MRN: [id number] Date of Birth: 09-Sep-1960  Subjective/Objective:  Pt presented with PE                   Action/Plan: PTA pt lived at home- plan to return home- referral received for Xarelto needs- per insurance check- [facility] [phone]   XARELTO 20 MG  DAILY   COVER- YES  CO-PAY - $ 30.00  PRIOR APPROVAL - NO  [facility]  MAIL -ORDER FOR 90 DAY SUPPLY $ 90.00  RETAIL FOR 90 DAY SUPPLY $ 90.00   Spoke with pt at bedside coverage info shared- [facility]- pt given both 30 day free card and the copay assist card- pt given instruction on where to go to get starter kit if script is written for that- she will plan [facility]- pt will need separate script with no refills to use with 30 day free card and then script with refills to use there after.   Expected Discharge Date:      06/22/16            Expected Discharge Plan:  Home/Self Care  In-House Referral:     Discharge planning Services  CM Consult, Medication Assistance  Post Acute Care Choice:    Choice offered to:     DME Arranged:    DME Agency:     HH Arranged:    HH Agency:     Status of Service:  Completed, signed off  If discussed at [employer] of Stay Meetings, dates discussed:    Additional Comments:  Dawayne Patricia, RN 06/22/2016, 9:49 AM

## 2016-06-22 NOTE — Discharge Summary (Signed)
Physician Discharge Summary  Wardell Mulready [id number] DOB: 11-17-1960  PCP: Lilian Coma, MD  Admit date: 06/20/2016 Discharge date: 06/22/2016   Recommendations for Outpatient Follow-up:  1. Dr. Jonathon Jordan, PCP in 4 days with repeat labs (CBC & BMP). 2. Dr. Pilar Plate Aluisio. Orthopedics: advised to keep appointment she has for 06/23/2016.  Home Health: None Equipment/Devices: None    Discharge Condition: Improved and stable.  CODE STATUS: Full  Diet recommendation: Heart Healthy & Diabetic diet.  Discharge Diagnoses:  Principal Problem:   Pulmonary emboli (HCC) Active Problems:   Diabetes (HCC)   Hypertension   Leukocytosis   Brief/Interim Summary: 56 year old female with PMH of type II DM, HTN, morbid obesity, mostly sedentary lifestyle, bilateral knee pain status post injections by orthopedics, failed outpatient treatment of presumed bronchitis with Z-Pak, Tessalon, Flonase, Tamiflu, levofloxacin, subsequently checked for d-dimer which was elevated and sent for CTA chest which confirmed acute PE, started on IV heparin and admitted for further evaluation. No personal or family history of VTE.   Assessment & Plan:   1. Acute PE/Bilateral lower extremity DVT: CTA chest 06/20/16 confirmed PE within the left main pulmonary artery, extending into the left upper and lower lobes and within the segmental pulmonary artery branch to the right lower lobe. No CT evidence of right heart strain. No personal or family history of VTE. No personal history of cancer or OCP use or long distance travel. However gives history of relative immobility for 2 weeks after some form of gel injection into bilateral knees on 04/21/16 when she had severe bilateral leg pains. Sedentary lifestyle. Started on IV heparin. 2-D echo does not show right heart strain. Discussed in detail regarding options of anticoagulation including warfarin versus NOAC's, risks and benefits. Pharmacy also discussed with  her regarding this. She opted Xarelto and has been transitioned to same. Status post recent steroid injection in left knee. Given history of lower extremity symptoms, obtained lower extremity venous Doppler which confirms DVT-results as below. Duration of anticoagulation at least 6 months but to be further determined by her PCP during outpatient follow-up. She has received 2 doses of Xarelto and case management has assisted her with outpatient resources for same. Advised her to use Tylenol when necessary, nasal sprays for her headaches which are likely related to sinus congestion. Advised to avoid NSAIDs due to risk of bleeding while on anticoagulation. 2. Mild leukocytosis: No clinical focus of sepsis.? Stress response to problem #1. Follow CBC as outpatient. 3. Essential hypertension: Controlled. Continue amlodipine and Diovan/HCT. 4. Type II DM: Patient was placed on reduced dose of Levemir and NovoLog. Tradjenta was held. CBGs were uncontrolled in the 200s as below. Resume all home medications at prior home doses at discharge. Recommend outpatient A1c if none done recently. 5. Left adrenal adenoma: Seen on CTA chest. Outpatient follow-up as deemed necessary. 6. Morbid obesity/Body mass index is 65.5 kg/m. Patient has to diet, exercise and lose weight. 7. Bilateral knee arthritis: Status post injections by orthopedics as outpatient. She states that she has an appointment with her orthopedic surgeon tomorrow. Advised her to discuss findings of lower extremity venous Dopplers (findings noted in bilateral popliteal fossa), DVT and PE with the surgeon and she verbalized understanding. 8. Anemia: Baseline hemoglobin is not known. She may have chronic anemia. No bleeding reported. Follow up with CBCs in a few days with PCP.   Consultants:   None  Procedures:   2-D echo 06/21/16: Study Conclusions  - Left ventricle: The cavity size was  normal. There was moderate concentric hypertrophy. Systolic  function was normal. The estimated ejection fraction was in the range of 60% to 65%. Wall motion was normal; there were no regional wall motion abnormalities. Left ventricular diastolic function parameters were normal. - Aortic valve: Transvalvular velocity was within the normal range. There was no stenosis. There was no regurgitation. - Mitral valve: Transvalvular velocity was within the normal range. There was no evidence for stenosis. There was no regurgitation. - Right ventricle: The cavity size was normal. Wall thickness was normal. Systolic function was normal. - Tricuspid valve: There was trivial regurgitation.   Bilateral Lower Extremity Venous Dopplers 06/21/2016: Summary: There is evidence of acute deep vein thrombosis involving the popliteal vein of the right lower extremity.  There is no evidence of superficial vein thrombosis of the right lower extremity. There is evidence of acute deep vein thrombosis involving the posterior tibial veins of the left lower extremity. There is no evidence of superficial vein thrombosis of the left lower extremity. Incidental findings of a non-vascularized, anechoic structure in the right popliteal fossa measuring 2.7 cm high by 1.7 cm wide by 3.9 cm long. There is another incidental finding of a non-vascularized, anechoic structure in the left popliteal fossa measuring 1.4 cm high by 3.6 cm wide by 3.6 cm long.  Discharge Instructions  Discharge Instructions    Call MD for:  difficulty breathing, headache or visual disturbances    Complete by:  As directed    Call MD for:  extreme fatigue    Complete by:  As directed    Call MD for:  persistant dizziness or light-headedness    Complete by:  As directed    Call MD for:  severe uncontrolled pain    Complete by:  As directed    Diet - low sodium heart healthy    Complete by:  As directed    Diet Carb Modified    Complete by:  As directed    Increase activity  slowly    Complete by:  As directed        Medication List    STOP taking these medications   ibuprofen 800 MG tablet Commonly known as:  ADVIL,MOTRIN   levofloxacin 500 MG tablet Commonly known as:  LEVAQUIN     TAKE these medications   acetaminophen 325 MG tablet Commonly known as:  TYLENOL Take 2 tablets (650 mg total) by mouth every 6 (six) hours as needed for mild pain, moderate pain or headache (or Fever >/= 101).   amLODipine 10 MG tablet Commonly known as:  NORVASC Take 10 mg by mouth daily.   benzonatate 100 MG capsule Commonly known as:  TESSALON Take 1-2 capsules (100-200 mg total) by mouth 3 (three) times daily as needed for cough.   dextromethorphan-guaiFENesin 30-600 MG 12hr tablet Commonly known as:  MUCINEX DM Take 1 tablet by mouth 2 (two) times daily as needed for cough.   fluticasone 50 MCG/ACT nasal spray Commonly known as:  FLONASE Place 2 sprays into both nostrils daily.   folic acid 1 MG tablet Commonly known as:  FOLVITE Take 1 mg by mouth daily.   LEVEMIR FLEXTOUCH 100 UNIT/ML Pen Generic drug:  Insulin Detemir Inject 75 Units into the skin 2 (two) times daily.   NOVOLOG FLEXPEN 100 UNIT/ML FlexPen Generic drug:  insulin aspart Inject 30 Units into the skin 3 (three) times daily with meals.   Rivaroxaban 15 & 20 MG Tbpk Take as directed on package: Start with  one 15mg  tablet by mouth twice a day with food. On Day 22, switch to one 20mg  tablet once a day with food.  Pharmacy: Please discard the first 2 tablets of the pack which she received in the hospital.   TRADJENTA 5 MG Tabs tablet Generic drug:  linagliptin Take 5 mg by mouth daily.   valsartan-hydrochlorothiazide 320-25 MG tablet Commonly known as:  DIOVAN-HCT Take 1 tablet by mouth daily.      Follow-up Information    Lilian Coma, MD. Schedule an appointment as soon as possible for a visit in 4 day(s).   Specialty:  Family Medicine Why:  To be seen with repeat labs  (CBC & BMP). Contact information: [place] [address] [phone]        Gearlean Alf, MD Follow up on 06/23/2016.   Specialty:  Orthopedic Surgery Why:  Keep prior appointment that you have. Contact information: [address] [place] [address] [phone]          Allergies  Allergen Reactions  . Penicillins Itching and Rash       Procedures/Studies: Dg Chest 2 View  Result Date: 06/19/2016 CLINICAL DATA:  56 year old presenting with cough and tachycardia. Current history of hypertension and diabetes. EXAM: CHEST  2 VIEW COMPARISON:  06/03/2016. FINDINGS: Cardiac silhouette normal in size, unchanged. Thoracic aorta mildly atherosclerotic, unchanged. Hilar and mediastinal contours otherwise unremarkable. Lungs clear. Bronchovascular markings normal. Pulmonary vascularity normal. No visible pleural effusions. No pneumothorax. Mild degenerative changes involving the thoracic spine. IMPRESSION: 1.  No acute cardiopulmonary disease. 2. Mild thoracic aortic atherosclerosis. Electronically Signed   By: Evangeline Dakin M.D.   On: 06/19/2016 17:56   Dg Chest 2 View  Result Date: 06/03/2016 CLINICAL DATA:  Persistent cough and congestion EXAM: CHEST  2 VIEW COMPARISON:  None. FINDINGS: Diminished detail from soft tissue attenuation. Normal heart size and mediastinal contours. No acute infiltrate or edema. No effusion or pneumothorax. No acute osseous findings. IMPRESSION: No active cardiopulmonary disease. Electronically Signed   By: Monte Fantasia M.D.   On: 06/03/2016 15:25   Ct Angio Chest W/cm &/or Wo Cm  Result Date: 06/20/2016 CLINICAL DATA:  Subacute onset of shortness of breath. Elevated D-dimer. Initial encounter. EXAM: CT ANGIOGRAPHY CHEST WITH CONTRAST TECHNIQUE: Multidetector CT imaging of the chest was performed using the standard protocol during bolus administration of intravenous contrast. Multiplanar CT  image reconstructions and MIPs were obtained to evaluate the vascular anatomy. CONTRAST:  100 mL of Omnipaque 300 IV contrast COMPARISON:  Chest radiograph performed 06/19/2016 FINDINGS: Cardiovascular: Pulmonary embolus is noted within the left main pulmonary artery, extending into the left upper and lower lobes. There is also pulmonary embolus within a segmental pulmonary artery branch to the right lower lobe. The RV/LV ratio is 0.85, without definite evidence for right heart strain. The heart is borderline normal in size. Mild calcification is noted along the aortic arch. The great vessels are grossly unremarkable in appearance. Mediastinum/Nodes: Visualized mediastinal nodes remain borderline normal in size. No pericardial effusion is identified. The thyroid gland is difficult to fully characterize, but appears grossly unremarkable. No axillary lymphadenopathy is seen. Lungs/Pleura: Minimal bibasilar atelectasis is noted. No pleural effusion or pneumothorax is seen. No masses are identified. Upper Abdomen: The visualized portions of the liver and spleen are grossly unremarkable. A likely fat-containing left adrenal adenoma is partially characterized. Musculoskeletal: No acute osseous abnormalities are identified. The visualized musculature is unremarkable in appearance. Review of the MIP  images confirms the above findings. IMPRESSION: 1. Pulmonary embolus within the left main pulmonary artery, extending into the left upper and lower lobes. Pulmonary embolus within a segmental pulmonary artery branch to the right lower lobe. RV/LV ratio of 0.85, without definite CT evidence for right heart strain at this time. 2. Minimal bibasilar atelectasis noted.  Lungs otherwise clear. 3. Likely fat-containing left adrenal adenoma partially characterized. Critical Value/emergent results were called by telephone at the time of interpretation on 06/20/2016 at 6:43 pm to Dr. [name], who verbally acknowledged these  results. Electronically Signed   By: Garald Balding M.D.   On: 06/20/2016 18:45      Subjective: Mild frontal/bitemporal headache, sinus congestion. Intermittent. No chest pain, dyspnea, dizziness, lightheadedness or bleeding reported. Patient seen along with her RN. As per RN, no acute issues reported.Marland Kitchen  Discharge Exam:  Vitals:   06/21/16 1310 06/21/16 2130 06/22/16 0650 06/22/16 0656  BP: (!) 143/66 (!) 111/57  129/66  Pulse: 94 88  92  Resp: 18 18  18   Temp: 98.3 F (36.8 C) 97.7 F (36.5 C) 98 F (36.7 C) 97.5 F (36.4 C)  TempSrc: Oral Oral Oral Oral  SpO2: 100% 99%  100%  Weight:      Height:        General exam: Moderately built and morbidly obese female sitting up comfortably in bed. Respiratory system: Clear to auscultation. Respiratory effort normal. Cardiovascular system: S1 & S2 heard, RRR. No JVD, murmurs, rubs, gallops or clicks. No pedal edema. Telemetry: Sinus rhythm. Gastrointestinal system: Abdomen is nondistended, soft and nontender. No organomegaly or masses felt. Normal bowel sounds heard. Central nervous system: Alert and oriented. No focal neurological deficits. Extremities: Symmetric 5 x 5 power. Skin: No rashes, lesions or ulcers Psychiatry: Judgement and insight appear normal. Mood & affect appropriate.     The results of significant diagnostics from this hospitalization (including imaging, microbiology, ancillary and laboratory) are listed below for reference.     Microbiology: No results found for this or any previous visit (from the past 240 hour(s)).   Labs:  Basic Metabolic Panel:  Recent Labs Lab 06/20/16 2047 06/21/16 0437  NA 138 134*  K 3.6 3.8  CL 99* 95*  CO2 29 25  GLUCOSE 142* 302*  BUN 21* 21*  CREATININE 0.96 1.01*  CALCIUM 10.0 9.2    CBC:  Recent Labs Lab 06/19/16 1748 06/20/16 2047 06/21/16 0437  WBC 14.1* 16.4* 15.0*  HGB 12.7 11.3* 10.1*  HCT 35.3* 35.3* 32.5*  MCV 84.6 87.6 88.3  PLT  --  330 297    Cardiac Enzymes:  Recent Labs Lab 06/20/16 2340  TROPONINI <0.03   CBG:  Recent Labs Lab 06/21/16 0625 06/21/16 1117 06/21/16 1652 06/21/16 2126 06/22/16 0651  GLUCAP 271* 241* 247* 264* 220*   D-Dimer  Recent Labs  06/20/16 0932  DDIMER 1.51*   Thyroid function studies  Recent Labs  06/19/16 1738  TSH 0.720      Time coordinating discharge: Over 30 minutes  SIGNED:  Vernell Leep, MD, FACP, FHM. [facility] Pager [phone]  If 7PM-7AM, please contact night-coverage [email or web address] Password [facility] 06/22/2016, 10:32 AM

## 2016-06-22 NOTE — Progress Notes (Addendum)
Inpatient Diabetes Program Recommendations  AACE/ADA: New Consensus Statement on Inpatient Glycemic Control (2015)  Target Ranges:  Prepandial:   less than 140 mg/dL      Peak postprandial:   less than 180 mg/dL (1-2 hours)      Critically ill patients:  140 - 180 mg/dL   Lab Results  Component Value Date   GLUCAP 220 (H) 06/22/2016   Results for Natasha Rollins, Natasha Rollins (MRN [id number]) as of 06/22/2016 10:10  Ref. Range 06/21/2016 06:25 06/21/2016 11:17 06/21/2016 16:52 06/21/2016 21:26 06/22/2016 06:51  Glucose-Capillary Latest Ref Range: 65 - 99 mg/dL 271 (H) 241 (H) 247 (H) 264 (H) 220 (H)   Review of Glycemic Control  Current orders for Inpatient glycemic control:     Novolog 0-8 units qhs,    Novolog 25 units tidac,    Levemir 75 units bid  Inpatient Diabetes Program Recommendations:     Please consider increasing basal insulin to Levemir 80 units bid to lower FBG's.     Please consider discontinuing current correction scale and ordering Resistant correction scale Novolog 0-20 units TIDAC and 0-5 units QHS.     Per ADA recommendations "consider performing an A1C on all patients with diabetes or hyperglycemia admitted to the hospital if not performed in the prior 3 months".  Thank you,  Windy Carina, RN, MSN Diabetes Coordinator Inpatient Diabetes Program [phone] (Team Pager)

## 2016-07-11 ENCOUNTER — Ambulatory Visit (INDEPENDENT_AMBULATORY_CARE_PROVIDER_SITE_OTHER): Payer: BC Managed Care – PPO | Admitting: Family Medicine

## 2016-07-11 VITALS — BP 122/58 | HR 108 | Temp 97.9°F | Resp 20 | Ht 64.0 in | Wt 379.0 lb

## 2016-07-11 DIAGNOSIS — I2699 Other pulmonary embolism without acute cor pulmonale: Secondary | ICD-10-CM | POA: Diagnosis not present

## 2016-07-11 DIAGNOSIS — D72829 Elevated white blood cell count, unspecified: Secondary | ICD-10-CM | POA: Diagnosis not present

## 2016-07-11 DIAGNOSIS — I82431 Acute embolism and thrombosis of right popliteal vein: Secondary | ICD-10-CM

## 2016-07-11 DIAGNOSIS — M79605 Pain in left leg: Secondary | ICD-10-CM | POA: Diagnosis not present

## 2016-07-11 DIAGNOSIS — R739 Hyperglycemia, unspecified: Secondary | ICD-10-CM

## 2016-07-11 DIAGNOSIS — D649 Anemia, unspecified: Secondary | ICD-10-CM | POA: Diagnosis not present

## 2016-07-11 LAB — POCT CBC
GRANULOCYTE PERCENT: 75.3 % (ref 37–80)
HCT, POC: 35.5 % — AB (ref 37.7–47.9)
Hemoglobin: 12 g/dL — AB (ref 12.2–16.2)
Lymph, poc: 2.8 (ref 0.6–3.4)
MCH: 29 pg (ref 27–31.2)
MCHC: 33.8 g/dL (ref 31.8–35.4)
MCV: 85.9 fL (ref 80–97)
MID (cbc): 0.6 (ref 0–0.9)
MPV: 7.4 fL (ref 0–99.8)
PLATELET COUNT, POC: 429 10*3/uL — AB (ref 142–424)
POC Granulocyte: 10.5 — AB (ref 2–6.9)
POC LYMPH PERCENT: 20.2 %L (ref 10–50)
POC MID %: 4.5 %M (ref 0–12)
RBC: 4.14 M/uL (ref 4.04–5.48)
RDW, POC: 16.8 %
WBC: 14 10*3/uL — AB (ref 4.6–10.2)

## 2016-07-11 LAB — GLUCOSE, POCT (MANUAL RESULT ENTRY): POC GLUCOSE: 150 mg/dL — AB (ref 70–99)

## 2016-07-11 NOTE — Patient Instructions (Addendum)
  Leg pain may be a combination of blood clot and knee pain.  Continue Xarelto, elevate leg as able tonight and tomorrow, and recheck in next few days if that pain is not improving to decide if repeat ultrasound if needed. You can also call your orthopaedist to discuss knee pain treatment.   Blood sugar 150 today - follow up with endocrinologist as planned.   Infection fighting cells were elevated, but improved from hospital stay. If any fevers, worsening cough, urinary symptoms or new symptoms of infection - be seen here or in Emergency room.   There was a small irregularity of your adrenal gland that was noted on the Chest CT.  This may be something check with other imaging or follow up study.  We can discuss this further at follow up.   Follow up in 1 week here or with Dr. Stephanie Acre.   Return to the clinic or go to the nearest emergency room if any of your symptoms worsen or new symptoms occur.   IF you received an x-ray today, you will receive an invoice from [facility]. Please contact [facility] at [phone] with questions or concerns regarding your invoice.   IF you received labwork today, you will receive an invoice from [facility]. Please contact [facility] at [phone] with questions or concerns regarding your invoice.   Our billing staff will not be able to assist you with questions regarding bills from these companies.  You will be contacted with the lab results as soon as they are available. The fastest way to get your results is to activate your My Chart account. Instructions are located on the last page of this paperwork. If you have not heard from [place] regarding the results in 2 weeks, please contact this office.      We recommend that you schedule a mammogram for breast cancer screening. Typically, you do not need a referral to do this. Please contact a local imaging center to schedule your mammogram.  [facility] - [phone]  *ask for the  Radiology Department [facility] - [phone] or [phone]  [facility] - [phone] [place] [phone] [facility] - [phone]  *ask for the [facility] - [phone]  *ask for the Radiology Department [facility] - [phone]  *ask for the [place] - [phone]

## 2016-07-11 NOTE — Progress Notes (Signed)
Subjective:    Patient ID: Natasha Rollins, female    DOB: August 31, 1960, 56 y.o.   MRN: [id number]  HPI Natasha Rollins is a 56 y.o. female Here for hospital follow-up, was admitted February 13 through February 15 for pulmonary emboli. Initially been treated for bronchitis with Z-Pak, Tessalon, Flonase, Tamiflu.  I saw patient on February 12, and based on continued tachycardia, ordered d-dimer that was elevated, sent for CTA chest with acute PE noted. LE doppler did confirm DVT in both legs.   Acute PE/Bilateral lower extremity DVT: Per hospital notes: CTA chest 06/20/16 confirmed PE within the left main pulmonary artery, extending into the left upper and lower lobes and within the segmental pulmonary artery branch to the right lower lobe. No CT evidence of right heart strain. No personal or family history of VTE. No personal history of cancer or OCP use or long distance travel. However gives history of relative immobility for 2 weeks after some form of gel injection into bilateral knees on 04/21/16 when she had severe bilateral leg pains. Sedentary lifestyle. Started on IV heparin. 2-D echo does not show right heart strain. Discussed in detail regarding options of anticoagulation including warfarin versus NOAC's, risks and benefits. Pharmacy also discussed with her regarding this. She opted Xarelto and has been transitioned to same. Status post recent steroid injection in left knee. Given history of lower extremity symptoms, obtained lower extremity venous Doppler which confirms DVT-results as below. Duration of anticoagulation at least 6 months but to be further determined by her PCP during outpatient follow-up. She has received 2 doses of Xarelto and case management has assisted her with outpatient resources for same. Advised her to use Tylenol when necessary, nasal sprays for her headaches which are likely related to sinus congestion. Advised to avoid NSAIDs due to risk of bleeding while on  anticoagulation.   Still having some leg pain, primarily knee pain, in both legs, but left is worse. U/S of LE indicated DVT bilaterally.  Same pain as in hospital. Feels like not getting better. No new chest pains or shortness of breath. Taking Xarelto twice per day. No missed doses. No new bruising or bleeding. No new side effects. Saw ortho 06/23/16 - cortisone shot on r knee. Previous shot in L knee last month. Has been having knee pain for some time. Has swelling in legs on both sides, but no new rash, no fevers.     Mild leukocytosis:No clinical focus of sepsis in hospital.  plan on CBC outpatient. As above she denies any new cough/shortness of breath, or fevers. No dysuria/frequency, no new rash  Type II DM:  Patient was placed on reduced dose of Levemir and NovoLog in hospital. [place] held. CBGs were uncontrolled in the 200s, but have improved now that she is back on her home medications. Home readings have been around 130, no symptomatic lows. Followed by Dr. Chalmers Cater. Has not followed up with endocrine since hospital discharge.   Left adrenal adenoma:Seen on CTA chest. "A likely fat-containing left adrenal adenoma is partially characterized". Outpatient follow-up planned. Plan on discussing further at follow-up next week to determine if dedicated abdominal imaging needed.  Anemia:  Noted in hospital. Plan for outpatient CBC, with possible previous chronic anemia.  PCP is Lilian Coma, MD, but following up here today as I had sent her to the hospital.  Patient Active Problem List   Diagnosis Date Noted  . Leukocytosis 06/21/2016  . Pulmonary emboli ([place]) 06/20/2016  . Diabetes ([place]) 12/22/2012  .  Hypertension 12/22/2012  . Morbid obesity with BMI of 60.0-69.9, adult ([place]) 07/08/2010  . OVARIAN CYST 07/08/2010   Past Medical History:  Diagnosis Date  . Diabetes mellitus without complication ([place])   . Hypertension    Past Surgical History:  Procedure Laterality Date  .  CHOLECYSTECTOMY    . HERNIA REPAIR    . TONSILLECTOMY     Allergies  Allergen Reactions  . Penicillins Itching and Rash   Prior to Admission medications   Medication Sig Start Date End Date Taking? Authorizing Provider  amLODipine (NORVASC) 10 MG tablet Take 10 mg by mouth daily. 06/07/14  Yes Historical Provider, MD  benzonatate (TESSALON) 100 MG capsule Take 1-2 capsules (100-200 mg total) by mouth 3 (three) times daily as needed for cough. 06/19/16  Yes Wendie Agreste, MD  fluticasone (FLONASE) 50 MCG/ACT nasal spray Place 2 sprays into both nostrils daily. 05/31/16  Yes Elizabeth Whitney McVey, PA-C  folic acid (FOLVITE) 1 MG tablet Take 1 mg by mouth daily.   Yes Historical Provider, MD  LEVEMIR FLEXTOUCH 100 UNIT/ML Pen Inject 75 Units into the skin 2 (two) times daily.  07/28/14  Yes Historical Provider, MD  NOVOLOG FLEXPEN 100 UNIT/ML FlexPen Inject 30 Units into the skin 3 (three) times daily with meals.  07/28/14  Yes Historical Provider, MD  Rivaroxaban 15 & 20 MG TBPK Take as directed on package: Start with one 15mg  tablet by mouth twice a day with food. On Day 22, switch to one 20mg  tablet once a day with food.  Pharmacy: Please discard the first 2 tablets of the pack which she received in the hospital. 06/22/16  Yes Modena Jansky, MD  TRADJENTA 5 MG TABS tablet Take 5 mg by mouth daily.  07/10/14  Yes Historical Provider, MD  valsartan-hydrochlorothiazide (DIOVAN-HCT) 320-25 MG tablet Take 1 tablet by mouth daily. 06/13/16  Yes Historical Provider, MD  acetaminophen (TYLENOL) 325 MG tablet Take 2 tablets (650 mg total) by mouth every 6 (six) hours as needed for mild pain, moderate pain or headache (or Fever >/= 101). Patient not taking: Reported on 07/11/2016 06/22/16   Modena Jansky, MD  dextromethorphan-guaiFENesin [facility] DM) 30-600 MG 12hr tablet Take 1 tablet by mouth 2 (two) times daily as needed for cough.    Historical Provider, MD   Social History   Social History  .  Marital status: Married    Spouse name: N/A  . Number of children: N/A  . Years of education: N/A   Occupational History  . Not on file.   Social History Main Topics  . Smoking status: Never Smoker  . Smokeless tobacco: Never Used  . Alcohol use No  . Drug use: No  . Sexual activity: Yes   Other Topics Concern  . Not on file   Social History Narrative  . No narrative on file    Review of Systems  Constitutional: Negative for chills and fever.  Cardiovascular: Positive for leg swelling.  Genitourinary: Negative for difficulty urinating and dysuria.  Musculoskeletal: Positive for arthralgias.     Also as above in HPI.        Objective:   Physical Exam  Constitutional: She is oriented to person, place, and time. She appears well-developed and well-nourished. No distress.  Obese.   HENT:  Head: Normocephalic and atraumatic.  Eyes: Conjunctivae and EOM are normal. Pupils are equal, round, and reactive to light.  Neck: Carotid bruit is not present.  Cardiovascular: Normal rate, regular rhythm,  normal heart sounds and intact distal pulses.   Pulmonary/Chest: Effort normal and breath sounds normal.  Distant but normal.   Abdominal: Soft. She exhibits no pulsatile midline mass. There is no tenderness.  Musculoskeletal: She exhibits edema.  Describes her area of pain as anterior knee, lower leg, popliteal fossa knee as well as some into her distal thigh. Skin was intact, no apparent warmth or erythema appreciated, somewhat difficult exam to assess effusion within the knee due to her body habitus.  Neurological: She is alert and oriented to person, place, and time.  Skin: Skin is warm and dry. No rash noted. She is not diaphoretic.  Psychiatric: She has a normal mood and affect. Her behavior is normal.  Vitals reviewed.  Vitals:   07/11/16 1616  BP: (!) 122/58  Pulse: (!) 108  Resp: 20  Temp: 97.9 F (36.6 C)  TempSrc: Oral  SpO2: 97%  Weight: (!) 379 lb (171.9 kg)    Height: 5\' 4"  (1.626 m)   Results for orders placed or performed in visit on 07/11/16  POCT CBC  Result Value Ref Range   WBC 14.0 (A) 4.6 - 10.2 K/uL   Lymph, poc 2.8 0.6 - 3.4   POC LYMPH PERCENT 20.2 10 - 50 %L   MID (cbc) 0.6 0 - 0.9   POC MID % 4.5 0 - 12 %M   POC Granulocyte 10.5 (A) 2 - 6.9   Granulocyte percent 75.3 37 - 80 %G   RBC 4.14 4.04 - 5.48 M/uL   Hemoglobin 12.0 (A) 12.2 - 16.2 g/dL   HCT, POC 35.5 (A) 37.7 - 47.9 %   MCV 85.9 80 - 97 fL   MCH, POC 29.0 27 - 31.2 pg   MCHC 33.8 31.8 - 35.4 g/dL   RDW, POC 16.8 %   Platelet Count, POC 429 (A) 142 - 424 K/uL   MPV 7.4 0 - 99.8 fL  POCT glucose (manual entry)  Result Value Ref Range   POC Glucose 150 (A) 70 - 99 mg/dl         Assessment & Plan:  Natasha Rollins is a 56 y.o. female Leukocytosis, unspecified type Other pulmonary embolism without acute cor pulmonale, unspecified chronicity (HCC) Acute deep vein thrombosis (DVT) of popliteal vein of right lower extremity (HCC) Left leg pain  - Hospital follow-up from bilateral pulmonary embolus, bilateral DVTs. Tolerating Xarelto without new bleeding. Continued leg pain, primarily left leg.   -pain may be a combination of her DVT, swelling with pedal edema/obesity, swelling from DVT, and previous knee pain.  - No fevers, but still some leukocytosis, although that has decreased from hospitalization. No focal signs of infection at this time, and afebrile.  -No signs of suppurative thrombophlebitis at this time, but advised to elevate leg, consult with orthopedist to discuss treatment options for knee pain, and follow-up in the next 2 days if pain is not improving (sooner if worse) as may need further imaging or evaluation for possible supparative thrombophlebitis.   Anemia, unspecified type - Plan: POCT CBC  - Improved from hospitalization, now just borderline. Can follow-up with primary care provider for stability.  Hyperglycemia - Plan: POCT glucose (manual  entry)  - Improved on outpatient regimen. Continue follow-up with endocrinologist.  Left adrenal adenoma  -Consider dedicated abdominal imaging, patient also discuss this with her endocrinologist follow-up. We can discuss plan further at next visit.  Recheck in one week, sooner if worse as above   No orders of the defined  types were placed in this encounter.  Patient Instructions    Leg pain may be a combination of blood clot and knee pain.  Continue Xarelto, elevate leg as able tonight and tomorrow, and recheck in next few days if that pain is not improving to decide if repeat ultrasound if needed. You can also call your orthopaedist to discuss knee pain treatment.   Blood sugar 150 today - follow up with endocrinologist as planned.   Infection fighting cells were elevated, but improved from hospital stay. If any fevers, worsening cough, urinary symptoms or new symptoms of infection - be seen here or in Emergency room.   There was a small irregularity of your adrenal gland that was noted on the Chest CT.  This may be something check with other imaging or follow up study.  We can discuss this further at follow up.   Follow up in 1 week here or with Dr. Stephanie Acre.   Return to the clinic or go to the nearest emergency room if any of your symptoms worsen or new symptoms occur.   IF you received an x-ray today, you will receive an invoice from [facility]. Please contact [facility] at [phone] with questions or concerns regarding your invoice.   IF you received labwork today, you will receive an invoice from [facility]. Please contact [facility] at [phone] with questions or concerns regarding your invoice.   Our billing staff will not be able to assist you with questions regarding bills from these companies.  You will be contacted with the lab results as soon as they are available. The fastest way to get your results is to activate your My Chart account.  Instructions are located on the last page of this paperwork. If you have not heard from [place] regarding the results in 2 weeks, please contact this office.      We recommend that you schedule a mammogram for breast cancer screening. Typically, you do not need a referral to do this. Please contact a local imaging center to schedule your mammogram.  [facility] - [phone]  *ask for the Radiology Department [facility] - [phone] or [phone]  [facility] - [phone] [place] [phone] [facility] - [phone]  *ask for the [facility] - [phone]  *ask for the Radiology Department [facility] - [phone]  *ask for the [facility] - [phone]  Signed,   [name], MD [facility] [place].  07/12/16 1:56 PM

## 2016-07-18 ENCOUNTER — Ambulatory Visit (INDEPENDENT_AMBULATORY_CARE_PROVIDER_SITE_OTHER): Payer: BC Managed Care – PPO | Admitting: Family Medicine

## 2016-07-18 VITALS — BP 128/68 | HR 120 | Temp 99.2°F | Resp 18 | Ht 64.0 in | Wt 374.8 lb

## 2016-07-18 DIAGNOSIS — I824Z3 Acute embolism and thrombosis of unspecified deep veins of distal lower extremity, bilateral: Secondary | ICD-10-CM | POA: Diagnosis not present

## 2016-07-18 DIAGNOSIS — M79605 Pain in left leg: Secondary | ICD-10-CM

## 2016-07-18 DIAGNOSIS — M79604 Pain in right leg: Secondary | ICD-10-CM | POA: Diagnosis not present

## 2016-07-18 LAB — POCT CBC
Granulocyte percent: 72.3 %G (ref 37–80)
HEMATOCRIT: 36.4 % — AB (ref 37.7–47.9)
HEMOGLOBIN: 12.3 g/dL (ref 12.2–16.2)
LYMPH, POC: 2.3 (ref 0.6–3.4)
MCH: 28.9 pg (ref 27–31.2)
MCHC: 33.9 g/dL (ref 31.8–35.4)
MCV: 85.3 fL (ref 80–97)
MID (cbc): 0.7 (ref 0–0.9)
MPV: 6.9 fL (ref 0–99.8)
POC Granulocyte: 7.7 — AB (ref 2–6.9)
POC LYMPH PERCENT: 21.6 %L (ref 10–50)
POC MID %: 6.1 %M (ref 0–12)
Platelet Count, POC: 429 10*3/uL — AB (ref 142–424)
RBC: 4.26 M/uL (ref 4.04–5.48)
RDW, POC: 16.5 %
WBC: 10.7 10*3/uL — AB (ref 4.6–10.2)

## 2016-07-18 MED ORDER — HYDROCODONE-ACETAMINOPHEN 5-325 MG PO TABS: 1.0000 | ORAL_TABLET | Freq: Four times a day (QID) | ORAL | 0 refills | Status: DC | PRN

## 2016-07-18 NOTE — Progress Notes (Addendum)
Subjective:  By signing my name below, I, [name], attest that this documentation has been prepared under the direction and in the presence of [name], MD. Electronically Signed: [name], [place]. 07/18/2016 , 6:27 PM .  Patient was seen in Room 11 .   Patient ID: Natasha Rollins, female    DOB: [birth year], 56 y.o.   MRN: [id number] Chief Complaint  Patient presents with  . Follow-up    blood clots in legs and lungs    HPI Natasha Rollins is a 56 y.o. female  Here for follow up. She had bilateral DVT and bilateral PE. She was seen for hospital discharge 1 week ago, still having some leg pain, left greater than right at that time. She is on Xarelto. She notes continued pain and thought component of her DVT, and body habitus and co-existing knee pain that is followed by orthopedics. She was recommended to consult her orthopedics for her knee pain, but follow up leg pain with me. She did have mild leukocytosis but improved from hospitalization and no fever.   She complains the leg pain is still the same and tender to touch with soreness down into her ankle. She's been having pain and difficulty with sleep due to pain. She's been taking tylenol #3's (500mg ) from Dr. Stephanie Acre after hospitalization. She's been taking tylenol 500mg  BID and 2 tablets earlier today. She's taken stronger medications in the past for the pain. She's also taken motrin for relief in the past, but was recommended to not take it while on Xarelto. She did call her orthopedics and will be seen this Friday (Mar 16th).   She also reports feeling sick and nauseous today. She denies fever, chills or rash.   Right Arm pain She also notes having right upper arm discomfort too. She states it started getting tight without swelling. She reports some pain when raising her right arm. She mentions that it started prior to her hospitalization.   Left adrenal adenoma Noted on imaging in hospital. Plan for  outpatient followed up.   Patient Active Problem List   Diagnosis Date Noted  . Leukocytosis 06/21/2016  . Pulmonary emboli ([place]) 06/20/2016  . Diabetes ([place]) 12/22/2012  . Hypertension 12/22/2012  . Morbid obesity with BMI of 60.0-69.9, adult ([place]) 07/08/2010  . OVARIAN CYST 07/08/2010   Past Medical History:  Diagnosis Date  . Diabetes mellitus without complication ([place])   . Hypertension    Past Surgical History:  Procedure Laterality Date  . CHOLECYSTECTOMY    . HERNIA REPAIR    . TONSILLECTOMY     Allergies  Allergen Reactions  . Penicillins Itching and Rash   Prior to Admission medications   Medication Sig Start Date End Date Taking? Authorizing Provider  acetaminophen (TYLENOL) 325 MG tablet Take 2 tablets (650 mg total) by mouth every 6 (six) hours as needed for mild pain, moderate pain or headache (or Fever >/= 101). Patient not taking: Reported on 07/11/2016 06/22/16   Modena Jansky, MD  amLODipine (NORVASC) 10 MG tablet Take 10 mg by mouth daily. 06/07/14   Historical Provider, MD  benzonatate (TESSALON) 100 MG capsule Take 1-2 capsules (100-200 mg total) by mouth 3 (three) times daily as needed for cough. 06/19/16   Wendie Agreste, MD  dextromethorphan-guaiFENesin [facility] DM) 30-600 MG 12hr tablet Take 1 tablet by mouth 2 (two) times daily as needed for cough.    Historical Provider, MD  fluticasone (FLONASE) 50 MCG/ACT nasal spray Place 2 sprays into  both nostrils daily. 05/31/16   Gelene Mink McVey, PA-C  folic acid (FOLVITE) 1 MG tablet Take 1 mg by mouth daily.    Historical Provider, MD  LEVEMIR FLEXTOUCH 100 UNIT/ML Pen Inject 75 Units into the skin 2 (two) times daily.  07/28/14   Historical Provider, MD  NOVOLOG FLEXPEN 100 UNIT/ML FlexPen Inject 30 Units into the skin 3 (three) times daily with meals.  07/28/14   Historical Provider, MD  Rivaroxaban 15 & 20 MG TBPK Take as directed on package: Start with one 15mg  tablet by mouth twice a day with food. On  Day 22, switch to one 20mg  tablet once a day with food.  Pharmacy: Please discard the first 2 tablets of the pack which she received in the hospital. 06/22/16   Modena Jansky, MD  TRADJENTA 5 MG TABS tablet Take 5 mg by mouth daily.  07/10/14   Historical Provider, MD  valsartan-hydrochlorothiazide (DIOVAN-HCT) 320-25 MG tablet Take 1 tablet by mouth daily. 06/13/16   Historical Provider, MD   Social History   Social History  . Marital status: Married    Spouse name: N/A  . Number of children: N/A  . Years of education: N/A   Occupational History  . Not on file.   Social History Main Topics  . Smoking status: Never Smoker  . Smokeless tobacco: Never Used  . Alcohol use No  . Drug use: No  . Sexual activity: Yes   Other Topics Concern  . Not on file   Social History Narrative  . No narrative on file   Review of Systems  Constitutional: Negative for chills, fatigue, fever and unexpected weight change.  Respiratory: Negative for cough.   Gastrointestinal: Positive for nausea. Negative for constipation, diarrhea and vomiting.  Musculoskeletal: Positive for arthralgias, joint swelling and myalgias. Negative for gait problem.  Skin: Negative for rash and wound.  Neurological: Negative for dizziness, weakness and headaches.       Objective:   Physical Exam  Constitutional: She is oriented to person, place, and time. She appears well-developed and well-nourished. No distress.  HENT:  Head: Normocephalic and atraumatic.  Eyes: EOM are normal. Pupils are equal, round, and reactive to light.  Neck: Neck supple.  Cardiovascular: Normal rate.   Pulmonary/Chest: Effort normal. No respiratory distress.  Musculoskeletal: Normal range of motion.  LEFT KNEE Tender to light touch across medial left calf from medial ankle up to knee in saphenous area, but not tender in medial upper thigh; also complains discomfort over dorsal thighs bilaterally RIGHT KNEE Slight warmth over right  medial calf, and tender along saphenous area on the right, not as tender as the left. Non-pitting edema into lower legs. Also tender bilateral calves. Tender along upper saphenous on right RIGHT ARM Tender along anterior right upper arm, as well as into right AC joint; remainder of upper arm and forearm, hand veins are flat. Exam limited due to body habitus; full abduction of right arm, pain with internal rotation, positive cross-over  Neurological: She is alert and oriented to person, place, and time.  Sensate distally, no apparent skin ulceration or breakdown.   Skin: Skin is warm and dry. No rash noted. No erythema.  Psychiatric: She has a normal mood and affect. Her behavior is normal.  Nursing note and vitals reviewed.   Vitals:   07/18/16 1629  BP: 128/68  Pulse: (!) 120  Resp: 18  Temp: 99.2 F (37.3 C)  TempSrc: Oral  SpO2: 95%  Weight: Marland Kitchen)  374 lb 12.8 oz (170 kg)  Height: 5\' 4"  (1.626 m)  Body mass index is 64.33 kg/m.  Results for orders placed or performed in visit on 07/18/16  POCT CBC  Result Value Ref Range   WBC 10.7 (A) 4.6 - 10.2 K/uL   Lymph, poc 2.3 0.6 - 3.4   POC LYMPH PERCENT 21.6 10 - 50 %L   MID (cbc) 0.7 0 - 0.9   POC MID % 6.1 0 - 12 %M   POC Granulocyte 7.7 (A) 2 - 6.9   Granulocyte percent 72.3 37 - 80 %G   RBC 4.26 4.04 - 5.48 M/uL   Hemoglobin 12.3 12.2 - 16.2 g/dL   HCT, POC 36.4 (A) 37.7 - 47.9 %   MCV 85.3 80 - 97 fL   MCH, POC 28.9 27 - 31.2 pg   MCHC 33.9 31.8 - 35.4 g/dL   RDW, POC 16.5 %   Platelet Count, POC 429 (A) 142 - 424 K/uL   MPV 6.9 0 - 99.8 fL       Assessment & Plan:   Natasha Rollins is a 56 y.o. female Deep vein thrombosis (DVT) of distal vein of both lower extremities, unspecified chronicity ([place]) - Plan: POCT CBC  Bilateral leg pain - Plan: POCT CBC, HYDROcodone-acetaminophen (NORCO/VICODIN) 5-325 MG tablet History of bilateral DVT diagnosed 1 month ago, and bilateral pulmonary embolus. Cyst left greater than  right leg pain. Has remained on anticoagulation. No sign of ulceration or cellulitis on exam. Afebrile and leukocytosis is improving. Less likely suppurative thrombophlebitis. Differential includes posttraumatic syndrome. Discussed with vascular specialist, unlikely propagation of clot on Xarelto.  -Hydrocodone was prescribed if needed for pain, side effects discussed  -Will recommend compressive stockings, ambulation as tolerated to keep moving.   -refer to vascular/vein specialist for evaluation, but if any worsening of symptoms prior to that visit, fevers, redness/rashes, or wounds on legs noted, advised to return to our office or emergency room.  Regarding the adrenal lesion seen on CT chest, advised to follow-up with her Primary care provider. If she chooses to follow-up with [place], can discuss that further in follow-up in the next few weeks.  Right shoulder pain likely AC joint based on exam. Advised to discuss with orthopedics.  Meds ordered this encounter  Medications  . HYDROcodone-acetaminophen (NORCO/VICODIN) 5-325 MG tablet    Sig: Take 1 tablet by mouth every 6 (six) hours as needed for moderate pain.    Dispense:  20 tablet    Refill:  0   Patient Instructions    For leg pains, can take hydrocodone up to every 6 hours as needed, and I will check into the next step tomorrow for your leg pain and to decide if you need to have repeat imaging or other evaluation.   For your shoulder pain, I would discuss options with your orthopaedist as it appears to be around your acromioclavicular joint. This is less likely due to the blood clots in your legs.   Return to the clinic or go to the nearest emergency room if any of your symptoms worsen or new symptoms occur.    IF you received an x-ray today, you will receive an invoice from [facility]. Please contact [facility] at [phone] with questions or concerns regarding your invoice.   IF you received labwork today,  you will receive an invoice from [place]. Please contact [facility] at [phone] with questions or concerns regarding your invoice.   Our billing staff will not be  able to assist you with questions regarding bills from these companies.  You will be contacted with the lab results as soon as they are available. The fastest way to get your results is to activate your My Chart account. Instructions are located on the last page of this paperwork. If you have not heard from [place] regarding the results in 2 weeks, please contact this office.       I personally performed the services described in this documentation, which was scribed in my presence. The recorded information has been reviewed and considered for accuracy and completeness, addended by me as needed, and agree with information above.  Signed,   [name], MD [facility] at [place].  07/19/16 1:54 PM

## 2016-07-18 NOTE — Patient Instructions (Addendum)
  For leg pains, can take hydrocodone up to every 6 hours as needed, and I will check into the next step tomorrow for your leg pain and to decide if you need to have repeat imaging or other evaluation.   For your shoulder pain, I would discuss options with your orthopaedist as it appears to be around your acromioclavicular joint. This is less likely due to the blood clots in your legs.   Return to the clinic or go to the nearest emergency room if any of your symptoms worsen or new symptoms occur.    IF you received an x-ray today, you will receive an invoice from [facility]. Please contact [facility] at [phone] with questions or concerns regarding your invoice.   IF you received labwork today, you will receive an invoice from [place]. Please contact [facility] at [phone] with questions or concerns regarding your invoice.   Our billing staff will not be able to assist you with questions regarding bills from these companies.  You will be contacted with the lab results as soon as they are available. The fastest way to get your results is to activate your My Chart account. Instructions are located on the last page of this paperwork. If you have not heard from [place] regarding the results in 2 weeks, please contact this office.

## 2016-07-21 ENCOUNTER — Telehealth: Payer: Self-pay | Admitting: Physician Assistant

## 2016-07-21 NOTE — Telephone Encounter (Signed)
-----   Message from Princella Ion sent at 07/21/2016 11:24 AM EDT ----- Regarding: Vascular Surgery Referral [facility] is needing to know if DVT is occlusive or non-oncclusive before scheduling. I can seen the notes from ED/imaging but do not know how to interpret them. Thank you!

## 2016-07-21 NOTE — Telephone Encounter (Signed)
Spoke with [facility]. Reviewed study performed 06/21/2016.  Because the non-compressible lesion is in the calf, and NOT also in the thigh, vascular doesn't treat this, can be managed by PCP.  They would only treat if there was a non-compressible lesion above the knee.

## 2016-07-24 ENCOUNTER — Telehealth: Payer: Self-pay | Admitting: Family Medicine

## 2016-07-24 DIAGNOSIS — I824Z3 Acute embolism and thrombosis of unspecified deep veins of distal lower extremity, bilateral: Secondary | ICD-10-CM

## 2016-07-24 NOTE — Telephone Encounter (Signed)
-----   Message from Margy Clarks, LPN sent at 0/35/4656 10:53 AM EDT ----- Regarding: RE: DVT Dr. Carlota Raspberry,  I received this message before you responded to me. Please advise if you still would like pt to be seen.  Lianne Cure, LPN   [facility] Mons, [place]  [facility]    Spoke with [facility]. Reviewed study performed 06/21/2016.  Because the non-compressible lesion is in the calf, and NOT also in the thigh, vascular doesn't treat this, can be managed by PCP.  They would only treat if there was a non-compressible lesion above the knee.  Electronically signed by Harrison Mons, PA-C at 07/21/2016 1:06 PM      ----- Message ----- From: Wendie Agreste, MD Sent: 07/21/2016   7:31 PM To: Margy Clarks, LPN Subject: RE: DVT                                        Ms. Mindi Junker,   I believe that Windell Hummingbird, PA-C may have spoken to you or your office earlier today to clarify this issue and reason for eval.  If other information needed, please let me know.   Dr. Carlota Raspberry  ----- Message ----- From: Margy Clarks, LPN Sent: 12/18/7515   2:54 PM To: Wendie Agreste, MD Subject: DVT                                            Dr. Carlota Raspberry,  Can you tell me if this pt has an occlusive or non-occlusive DVT.  Thank you, Lianne Cure, LPN

## 2016-07-24 NOTE — Telephone Encounter (Signed)
Please call patient and check status of leg pain. It appears based on location of DVT, vascular specialist may not have anything to add at this time. If she is improving with current plan, may be able to hold off on that referral at this point. Let me know so I can decide how to proceed from here.

## 2016-07-26 NOTE — Telephone Encounter (Signed)
Discussed your note to patient.  From our discussed, this is what was obtained... Patient reports that she had an ultrasound of her lower extremity for the worsening pain.  This was ordered by her pcp, Dr. Donivan Thammavong Acre.  This apparently was negative. Dr. Adelaida Reindel Acre is going to switch her from the xarelto to lovenox. though she does not know why.  lovenox is out of stock at her pharmacy.  She continues to take the xarelto.  She was also placed on a diuretic. She would like you to continue to follow her treatment, and possibly become primary... She is scheduling an office visit tomorrow.  Advised that she needs to bring medication list and any medications that she will be start.  Warned of risk of drug interaction with going back and forth to provider to provider.

## 2016-07-26 NOTE — Telephone Encounter (Signed)
I am out of the office today, but back in tomorrow. Can talk to her further at that time if needed, but based on her symptoms last visit I did discuss with vascular surgery. After attempt at referral to vascular, it appears that they would not need to see her as lesion not in thigh. It may be worthwhile checking another ultrasound to see if that clot has progressed or if other new areas involved. I placed order. Would remain on Xarelto for anticoagulation at this point. If acute worsening, should be seen in office here or her primary care provider. Please call her with this plan.

## 2016-07-26 NOTE — Telephone Encounter (Signed)
Pt states leg pain is no better. No new symptoms but wants to see somebody. Wonders if she stays on xeralto or ? She asks to please speak to Dr Carlota Raspberry directly (him to call her)

## 2016-07-27 ENCOUNTER — Ambulatory Visit: Payer: BC Managed Care – PPO | Admitting: Family Medicine

## 2016-07-27 NOTE — Telephone Encounter (Signed)
Previous note reviewed. I see that she is on my schedule for 5:00 today. Please call over to Dr. Stephanie Acre office and obtain any records pertaining to the leg pain, including results of ultrasound as well as note of why she is being changed to Lovenox.

## 2016-07-27 NOTE — Telephone Encounter (Signed)
Triage nurse Diane is working on faxing documentation and [place] results. Fax number provided.

## 2016-07-31 ENCOUNTER — Telehealth: Payer: Self-pay | Admitting: Hematology

## 2016-07-31 NOTE — Telephone Encounter (Signed)
Patient had an [place], and reports that she rescheduled the visit with Dr. Carlota Raspberry from last Thursday (3/22) to this Thursday (3/29). I do not see that in the record. Transferred call to Ms. A Payne to schedule follow-up and to re-request records.

## 2016-07-31 NOTE — Telephone Encounter (Signed)
Appt has been scheduled for the pt to see Dr. Irene Limbo on 4/12 at 62am. Spoke to [facility], Dr. Grier Mitts nurse who stated it was ok to schedule in time slot. Pt agreed to the appt date and time. Demographics verifed. Letter mailed and faxed to the referring.

## 2016-07-31 NOTE — Telephone Encounter (Signed)
Please check status of Natasha Rollins's leg pain.  Scheduled with me for a visit last Thursday but did not show for appointment. Is her leg pain being managed by her primary care provider, or myself?  Has she had repeat ultrasound?  Let me know if I can help. I have not seen any records from her primary care provider's office, but do not need to request those again if her primary care provider is following her for her leg pain at this time.

## 2016-07-31 NOTE — Telephone Encounter (Signed)
Dr. Carlota Raspberry,  If you did not receive the records from Dr. Stephanie Acre' office and still need them, please send this message to the Medical Records pool.  If you have received the records, please close this encounter.

## 2016-08-03 ENCOUNTER — Encounter: Payer: Self-pay | Admitting: Family Medicine

## 2016-08-03 ENCOUNTER — Ambulatory Visit (INDEPENDENT_AMBULATORY_CARE_PROVIDER_SITE_OTHER): Payer: BC Managed Care – PPO | Admitting: Family Medicine

## 2016-08-03 VITALS — BP 115/61 | HR 120 | Temp 99.1°F | Ht 64.0 in | Wt 377.6 lb

## 2016-08-03 DIAGNOSIS — E119 Type 2 diabetes mellitus without complications: Secondary | ICD-10-CM | POA: Diagnosis not present

## 2016-08-03 DIAGNOSIS — R252 Cramp and spasm: Secondary | ICD-10-CM | POA: Diagnosis not present

## 2016-08-03 DIAGNOSIS — M25562 Pain in left knee: Secondary | ICD-10-CM | POA: Diagnosis not present

## 2016-08-03 DIAGNOSIS — Z794 Long term (current) use of insulin: Secondary | ICD-10-CM

## 2016-08-03 DIAGNOSIS — M79604 Pain in right leg: Secondary | ICD-10-CM

## 2016-08-03 DIAGNOSIS — M79605 Pain in left leg: Secondary | ICD-10-CM

## 2016-08-03 DIAGNOSIS — I824Y9 Acute embolism and thrombosis of unspecified deep veins of unspecified proximal lower extremity: Secondary | ICD-10-CM

## 2016-08-03 MED ORDER — HYDROCODONE-ACETAMINOPHEN 5-325 MG PO TABS: 1.0000 | ORAL_TABLET | Freq: Four times a day (QID) | ORAL | 0 refills | Status: DC | PRN

## 2016-08-03 NOTE — Progress Notes (Signed)
By signing my name below, I, Natasha Rollins, attest that this documentation has been prepared under the direction and in the presence of Natasha Ray, MD.  Electronically Signed: Verlee Rollins, Medical Scribe. 08/03/16. 5:39 PM.  Subjective:    Patient ID: Natasha Rollins, female    DOB: July 05, 1960, 56 y.o.   MRN: [id number]  HPI Chief Complaint  Patient presents with  . Follow-up    DVT f/u    HPI Comments: Natasha Rollins is a 56 y.o. female who presents to the Primary Care at [facility] for leg pain follow-up. See prev evaluations. She was admitted with bilateral PE and DVT Feb 13-15, 2018. She's had persistent L>R leg pain, but has remained on xarelto. She has also suffered from left knee pain followed by orthopedics and has received injections for that pain. Her last visit with me was Match 15th, still having pain with soreness extending from the lower leg to the ankle. She had a previous leukocytosis but was improving, and continued to improved at March 13th visit to 10.7. Possibility of post-thrombotic syndrome, hydrocodone was perscribed. Discussed with vascular specialist, recommended compression stockings and ambulation. Planned for vascular evaluation but because of the non-compressible lesion, was in calf but not in thigh, unable to obtain appt with vascular. She had also been followed by Dr .Cheron Schaumann who apparently also checked an ultrasound recently. It also appears she is scheduled to see Dr. Irene Limbo with hematology on April 12th.  [name] knee pain starts from the top of her knee and radiates up to her hip or down to her lower leg. Pt last saw Dr. Thayer Jew 6 days ago and was told the xarelto may not be strong enough. Dr. Cheron Schaumann ordered a [place] around March 21st on both legs at [facility] and no blood clots were found, but a bakers cyst behind her knee was found. Dr. Cheron Schaumann suspected the cyst was causing the knee pain, and rx'ed a topical NSAID for her  worsening knee pain that was just approved today and had her first injection on the 21st. Pt was rx lovenox 07/24/16, but discontinued on the 20th due to experiencing numbness/tingling in her finger tips as well as itchiness and went back on xarelto 20 mg (been taking it for a week now). Pt has been taking hydrocodone BID and tylenol extra strength daily for some relief of her pain. Pt was told she couldn't get another knee injection for another 3 months, with 6 more weeks remaining until her next injection, so she was suggested physical therapy for her pain. Pt has used her compression stocking once, but plans on using them more since she's going out of town soon.  Patient Active Problem List   Diagnosis Date Noted  . Leukocytosis 06/21/2016  . Pulmonary emboli ([place]) 06/20/2016  . Diabetes ([place]) 12/22/2012  . Hypertension 12/22/2012  . Morbid obesity with BMI of 60.0-69.9, adult ([place]) 07/08/2010  . OVARIAN CYST 07/08/2010   Past Medical History:  Diagnosis Date  . Diabetes mellitus without complication ([place])   . Hypertension    Past Surgical History:  Procedure Laterality Date  . CHOLECYSTECTOMY    . HERNIA REPAIR    . TONSILLECTOMY     Allergies  Allergen Reactions  . Penicillins Itching and Rash   Prior to Admission medications   Medication Sig Start Date End Date Taking? Authorizing Provider  acetaminophen (TYLENOL) 325 MG tablet Take 2 tablets (650 mg total) by mouth every 6 (six) hours as needed  for mild pain, moderate pain or headache (or Fever >/= 101). 06/22/16  Yes Modena Jansky, MD  amLODipine (NORVASC) 10 MG tablet Take 10 mg by mouth daily. 06/07/14  Yes Historical Provider, MD  benzonatate (TESSALON) 100 MG capsule Take 1-2 capsules (100-200 mg total) by mouth 3 (three) times daily as needed for cough. 06/19/16  Yes Wendie Agreste, MD  dextromethorphan-guaiFENesin [facility] DM) 30-600 MG 12hr tablet Take 1 tablet by mouth 2 (two) times daily as needed for cough.   Yes  Historical Provider, MD  fluticasone (FLONASE) 50 MCG/ACT nasal spray Place 2 sprays into both nostrils daily. 05/31/16  Yes Elizabeth Whitney McVey, PA-C  folic acid (FOLVITE) 1 MG tablet Take 1 mg by mouth daily.   Yes Historical Provider, MD  HYDROcodone-acetaminophen (NORCO/VICODIN) 5-325 MG tablet Take 1 tablet by mouth every 6 (six) hours as needed for moderate pain. 07/18/16  Yes Wendie Agreste, MD  LEVEMIR FLEXTOUCH 100 UNIT/ML Pen Inject 75 Units into the skin 2 (two) times daily.  07/28/14  Yes Historical Provider, MD  NOVOLOG FLEXPEN 100 UNIT/ML FlexPen Inject 30 Units into the skin 3 (three) times daily with meals.  07/28/14  Yes Historical Provider, MD  rivaroxaban (XARELTO) 20 MG TABS tablet Take 20 mg by mouth daily with supper.   Yes Historical Provider, MD  Rivaroxaban 15 & 20 MG TBPK Take as directed on package: Start with one 15mg  tablet by mouth twice a day with food. On Day 22, switch to one 20mg  tablet once a day with food.  Pharmacy: Please discard the first 2 tablets of the pack which she received in the hospital. 06/22/16  Yes Modena Jansky, MD  TRADJENTA 5 MG TABS tablet Take 5 mg by mouth daily.  07/10/14  Yes Historical Provider, MD  valsartan-hydrochlorothiazide (DIOVAN-HCT) 320-25 MG tablet Take 1 tablet by mouth daily. 06/13/16  Yes Historical Provider, MD  enoxaparin (LOVENOX) 150 MG/ML injection Inject 150 mg into the skin every 12 (twelve) hours.    Historical Provider, MD   Social History   Social History  . Marital status: Married    Spouse name: N/A  . Number of children: N/A  . Years of education: N/A   Occupational History  . Not on file.   Social History Main Topics  . Smoking status: Never Smoker  . Smokeless tobacco: Never Used  . Alcohol use No  . Drug use: No  . Sexual activity: Yes   Other Topics Concern  . Not on file   Social History Narrative  . No narrative on file   Review of Systems  Musculoskeletal: Positive for arthralgias.    Neurological: Positive for numbness.   Objective:  Physical Exam  Constitutional: She appears well-developed and well-nourished. No distress.  HENT:  Head: Normocephalic and atraumatic.  Eyes: Conjunctivae are normal.  Neck: Neck supple.  Cardiovascular: Normal rate.   Pulses:      Dorsalis pedis pulses are 2+ on the right side, and 2+ on the left side.  Pulmonary/Chest: Effort normal.  Musculoskeletal:  Describes pain in the anterior and posterior knee Tender along the popliteal fossa and area where possible baker cyst was noted In her lower extremities she has 1-2+ edema without skin rash or wounds Pain in the anterior knee with extension Negative seated to straight leg raise  Neurological: She is alert.  Skin: Skin is warm and dry.  Cap refill > 1 sec  Psychiatric: She has a normal mood and affect. Her  behavior is normal.  Nursing note and vitals reviewed.   Vitals:   08/03/16 1646  BP: 115/61  Pulse: (!) 120  Temp: 99.1 F (37.3 C)  TempSrc: Oral  SpO2: 95%  Weight: (!) 377 lb 9.6 oz (171.3 kg)  Height: 5\' 4"  (1.626 m)  Body mass index is 64.81 kg/m. Assessment & Plan:   Shenique Childers is a 56 y.o. female Type 2 diabetes mellitus without complication, with long-term current use of insulin ([place]) - Plan: HM Diabetes Foot Exam  Left leg pain  Left knee pain, unspecified chronicity  Deep vein thrombosis (DVT) of proximal lower extremity, unspecified chronicity, unspecified laterality ([facility])  Muscle cramps - Plan: CK, Basic metabolic panel, Magnesium  Bilateral leg pain - Plan: HYDROcodone-acetaminophen (NORCO/VICODIN) 5-325 MG tablet  Reportedly had recent ultrasound negative for DVT. Will check lytes, CPK, but may be multifactorial pain with swelling, and arthritis of knee, also potentially sciatica with radiating pain.   -Continue topical NSAID prescribed by primary care provider.  - If persistent radiating pain, consider physical therapy or further  discussion of treatment options with orthopedics. Also potential referral to neurology for radicular pain evaluation.   -Hydrocodone was refilled for now, but if persistent use required, ortho/neuro eval would be needed.  -Compression stockings as needed for lower extremity edema.  -ER/RTC precautions.  Meds ordered this encounter  Medications  . DISCONTD: enoxaparin (LOVENOX) 150 MG/ML injection    Sig: Inject 150 mg into the skin every 12 (twelve) hours.  . rivaroxaban (XARELTO) 20 MG TABS tablet    Sig: Take 20 mg by mouth daily with supper.  Marland Kitchen HYDROcodone-acetaminophen (NORCO/VICODIN) 5-325 MG tablet    Sig: Take 1 tablet by mouth every 6 (six) hours as needed for moderate pain.    Dispense:  20 tablet    Refill:  0   Patient Instructions   If no clot seen on most recent ultrasound, would not recommend any change in blood thinners until evaluated by hematology.   You might want to discuss physical therapy and other bone or muscle pain treatments with orthopaedics, but the cream that was prescribed by Dr. Stephanie Acre may also provide some benefit. If you continue to have pain radiating from buttock down your leg, that can be sciatica and may need to be treated differently.   I will check electrolytes and muscle enzyme test for the cramps, but unlikely cause of your pain.   Ok to continue hydrocodone for now, but if pain not improving in next 2 weeks, may need to meet with neurologist to look at nerve pain as a cause.   Wear compression stockings to help with fluid in legs as swelling can also cause discomfort.   Return to the clinic or go to the nearest emergency room if any of your symptoms worsen or new symptoms occur.   IF you received an x-Rollins today, you will receive an invoice from [facility]. Please contact [facility] at [phone] with questions or concerns regarding your invoice.   IF you received labwork today, you will receive an invoice from [place].  Please contact [facility] at [phone] with questions or concerns regarding your invoice.   Our billing staff will not be able to assist you with questions regarding bills from these companies.  You will be contacted with the lab results as soon as they are available. The fastest way to get your results is to activate your My Chart account. Instructions are located on the last page of this paperwork.  If you have not heard from [place] regarding the results in 2 weeks, please contact this office.       I personally performed the services described in this documentation, which was scribed in my presence. The recorded information has been reviewed and considered for accuracy and completeness, addended by me as needed, and agree with information above.  Signed,   Natasha Ray, MD Primary Care at [place].  08/09/16 10:10 PM

## 2016-08-03 NOTE — Patient Instructions (Addendum)
If no clot seen on most recent ultrasound, would not recommend any change in blood thinners until evaluated by hematology.   You might want to discuss physical therapy and other bone or muscle pain treatments with orthopaedics, but the cream that was prescribed by Dr. Stephanie Acre may also provide some benefit. If you continue to have pain radiating from buttock down your leg, that can be sciatica and may need to be treated differently.   I will check electrolytes and muscle enzyme test for the cramps, but unlikely cause of your pain.   Ok to continue hydrocodone for now, but if pain not improving in next 2 weeks, may need to meet with neurologist to look at nerve pain as a cause.   Wear compression stockings to help with fluid in legs as swelling can also cause discomfort.   Return to the clinic or go to the nearest emergency room if any of your symptoms worsen or new symptoms occur.   IF you received an x-ray today, you will receive an invoice from [facility]. Please contact [facility] at [phone] with questions or concerns regarding your invoice.   IF you received labwork today, you will receive an invoice from [place]. Please contact [facility] at [phone] with questions or concerns regarding your invoice.   Our billing staff will not be able to assist you with questions regarding bills from these companies.  You will be contacted with the lab results as soon as they are available. The fastest way to get your results is to activate your My Chart account. Instructions are located on the last page of this paperwork. If you have not heard from [place] regarding the results in 2 weeks, please contact this office.

## 2016-08-04 LAB — SPECIMEN STATUS

## 2016-08-04 LAB — BASIC METABOLIC PANEL
BUN/Creatinine Ratio: 26 — ABNORMAL HIGH (ref 9–23)
BUN: 31 mg/dL — ABNORMAL HIGH (ref 6–24)
CO2: 27 mmol/L (ref 18–29)
CREATININE: 1.2 mg/dL — AB (ref 0.57–1.00)
Calcium: 10.2 mg/dL (ref 8.7–10.2)
Chloride: 94 mmol/L — ABNORMAL LOW (ref 96–106)
GFR, EST AFRICAN AMERICAN: 58 mL/min/{1.73_m2} — AB (ref >59)
GFR, EST NON AFRICAN AMERICAN: 51 mL/min/{1.73_m2} — AB (ref >59)
Glucose: 106 mg/dL — ABNORMAL HIGH (ref 65–99)
POTASSIUM: 4.9 mmol/L (ref 3.5–5.2)
SODIUM: 138 mmol/L (ref 134–144)

## 2016-08-04 LAB — MAGNESIUM: Magnesium: 1.8 mg/dL (ref 1.6–2.3)

## 2016-08-04 LAB — CK: Total CK: 134 U/L (ref 24–173)

## 2016-08-05 ENCOUNTER — Ambulatory Visit: Payer: BC Managed Care – PPO

## 2016-08-17 ENCOUNTER — Encounter (INDEPENDENT_AMBULATORY_CARE_PROVIDER_SITE_OTHER): Payer: Self-pay

## 2016-08-17 ENCOUNTER — Ambulatory Visit (HOSPITAL_BASED_OUTPATIENT_CLINIC_OR_DEPARTMENT_OTHER): Payer: BC Managed Care – PPO | Admitting: Hematology

## 2016-08-17 ENCOUNTER — Encounter: Payer: Self-pay | Admitting: Hematology

## 2016-08-17 VITALS — BP 129/60 | HR 101 | Temp 98.3°F | Resp 18 | Ht 64.0 in | Wt 381.0 lb

## 2016-08-17 DIAGNOSIS — I8290 Acute embolism and thrombosis of unspecified vein: Secondary | ICD-10-CM | POA: Diagnosis not present

## 2016-08-17 DIAGNOSIS — I2699 Other pulmonary embolism without acute cor pulmonale: Secondary | ICD-10-CM | POA: Diagnosis not present

## 2016-08-17 DIAGNOSIS — Z6841 Body Mass Index (BMI) 40.0 and over, adult: Secondary | ICD-10-CM

## 2016-08-17 DIAGNOSIS — D6859 Other primary thrombophilia: Secondary | ICD-10-CM | POA: Diagnosis not present

## 2016-08-17 NOTE — Progress Notes (Signed)
Marland Kitchen    HEMATOLOGY/ONCOLOGY CONSULTATION NOTE  Date of Service: 08/17/2016  Patient Care Team: Jonathon Jordan, MD as PCP - General (Family Medicine)  CHIEF COMPLAINTS/PURPOSE OF CONSULTATION:  b/l DVT and PE  HISTORY OF PRESENTING ILLNESS:   Natasha Rollins is a wonderful 56 y.o. female who has been referred to [place] by Dr .Carlota Raspberry, Ranell Patrick, MD / Dr Jonathon Jordan MD for evaluation and management of recent findings of bilateral DVT and pulmonary embolism in February 2018.  Patient has a history of hypertension, diabetes, morbid obesity.Body mass index is 65.4 kg/m. Who presented to [facility] and was admitted from 06/20/2016-06/22/2016 with acute pulmonary embolism and bilateral lower extremity DVT.  Patient reports that she had flulike symptoms and progressive shortness of breath. She was empirically treated as outpatient with Z-Pak, Tessalon, Flonase, Tamiflu, levofloxacin after which a d-dimer was checked and noted to be elevated. Patient was sent to the emergency room had a CTA of the chest which showed pulmonary embolus within her left and pulmonary artery extending into the left upper and lower lobes. Also noted to have a pulmonary embolus within the segmental pulmonary artery branch to the right lower lobe.  Ultrasound of the lower extremity veins showed evidence of acute DVT involving the popliteal vein of the right lower extremity. Acute DVT involving the posterior tibial veins of the left lower extremity.  Patient was started on IV heparin and was eventually transitioned to Xarelto on discharge.  Risk factors noted were No personal or family history of VTE. No personal history of cancer or OCP use or long distance travel. However gives history of relative immobility for 2 weeks after some form of gel injection into bilateral knees on 04/21/16 when she had severe bilateral leg pains. Sedentary lifestyle. Recent significant pulmonary infection.  Patient has significant  discomfort from bilateral knee arthritis at baseline for which she follows with orthopedics. She is following with her primary care physician for pain management of this.  She was sent to [place] for further evaluation and recommendations regarding anticoagulation.  Patient notes no issues taking her Xarelto. Notes that her respiratory symptoms including shortness of breath have improved significantly. Her Pain is improving and she still has her chronic knee pain from arthritis.  No evidence for bleeding.  She notes she is up to speed with her age-appropriate cancer screening. Had her last mammogram in October 2017 which was negative. She notes her last colonoscopy was about 5 years back and that she is due for another one. She notes that her Pap smear. Is due in May 2018.  No other reported. Focal symptoms.   MEDICAL HISTORY:  Past Medical History:  Diagnosis Date  . Diabetes mellitus without complication ([place])   . Hypertension   Morbid obesity Body mass index is 65.4 kg/m. Vitamin D deficiency Rosacea Hidradenitis  SURGICAL HISTORY: Past Surgical History:  Procedure Laterality Date  . CHOLECYSTECTOMY    . HERNIA REPAIR    . TONSILLECTOMY      SOCIAL HISTORY: Social History   Social History  . Marital status: Married    Spouse name: N/A  . Number of children: N/A  . Years of education: N/A   Occupational History  . Not on file.   Social History Main Topics  . Smoking status: Never Smoker  . Smokeless tobacco: Never Used  . Alcohol use No  . Drug use: No  . Sexual activity: Yes   Other Topics Concern  . Not on file   Social  History Narrative  . No narrative on file    FAMILY HISTORY: History reviewed. No pertinent family history.  ALLERGIES:  is allergic to penicillins.  MEDICATIONS:  Current Outpatient Prescriptions  Medication Sig Dispense Refill  . acetaminophen (TYLENOL) 325 MG tablet Take 2 tablets (650 mg total) by mouth every 6 (six) hours as  needed for mild pain, moderate pain or headache (or Fever >/= 101).    Marland Kitchen amLODipine (NORVASC) 10 MG tablet Take 10 mg by mouth daily.  8  . benzonatate (TESSALON) 100 MG capsule Take 1-2 capsules (100-200 mg total) by mouth 3 (three) times daily as needed for cough. 40 capsule 0  . dextromethorphan-guaiFENesin (MUCINEX DM) 30-600 MG 12hr tablet Take 1 tablet by mouth 2 (two) times daily as needed for cough.    . fluticasone (FLONASE) 50 MCG/ACT nasal spray Place 2 sprays into both nostrils daily. 16 g 6  . folic acid (FOLVITE) 1 MG tablet Take 1 mg by mouth daily.    Marland Kitchen HYDROcodone-acetaminophen (NORCO/VICODIN) 5-325 MG tablet Take 1 tablet by mouth every 6 (six) hours as needed for moderate pain. 20 tablet 0  . LEVEMIR FLEXTOUCH 100 UNIT/ML Pen Inject 75 Units into the skin 2 (two) times daily.     Marland Kitchen NOVOLOG FLEXPEN 100 UNIT/ML FlexPen Inject 30 Units into the skin 3 (three) times daily with meals.     . rivaroxaban (XARELTO) 20 MG TABS tablet Take 20 mg by mouth daily with supper.    . Rivaroxaban 15 & 20 MG TBPK Take as directed on package: Start with one 15mg  tablet by mouth twice a day with food. On Day 22, switch to one 20mg  tablet once a day with food.  Pharmacy: Please discard the first 2 tablets of the pack which she received in the hospital. 51 each 0  . TRADJENTA 5 MG TABS tablet Take 5 mg by mouth daily.   5  . valsartan-hydrochlorothiazide (DIOVAN-HCT) 320-25 MG tablet Take 1 tablet by mouth daily.     No current facility-administered medications for this visit.     REVIEW OF SYSTEMS:    10 Point review of Systems was done is negative except as noted above.  PHYSICAL EXAMINATION: ECOG PERFORMANCE STATUS: 2 - Symptomatic, <50% confined to bed  . Vitals:   08/17/16 0843  BP: 129/60  Pulse: (!) 101  Resp: 18  Temp: 98.3 F (36.8 C)   Filed Weights   08/17/16 0843  Weight: (!) 381 lb (172.8 kg)   .Body mass index is 65.4 kg/m.  GENERAL:alert, in no acute distress and  comfortable, significantly overweight. SKIN: no acute rashes, no significant lesions EYES: conjunctiva are pink and non-injected, sclera anicteric OROPHARYNX: MMM, no exudates, no oropharyngeal erythema or ulceration NECK: supple, no JVD LYMPH:  no palpable lymphadenopathy in the cervical, axillary or inguinal regions LUNGS: clear to auscultation b/l with normal respiratory effort HEART: regular rate & rhythm ABDOMEN:  normoactive bowel sounds , non tender, not distended. Extremity: Bilateral large lower extremities with trace pitting pedal edema PSYCH: alert & oriented x 3 with fluent speech NEURO: no focal motor/sensory deficits  LABORATORY DATA:  I have reviewed the data as listed  . CBC Latest Ref Rng & Units 07/18/2016 07/11/2016  WBC - 10.7(A) 14.0(A)  Hemoglobin 12.2 - 16.2 g/dL 12.3 12.0(A)  Hematocrit - 36.4(A) 35.5(A)  Platelets - - -    . CMP Latest Ref Rng & Units 08/03/2016 06/21/2016 06/20/2016  Glucose 65 - 99 mg/dL 106(H) 302(H) 142(H)  BUN 6 - 24 mg/dL 31(H) 21(H) 21(H)  Creatinine 0.57 - 1.00 mg/dL 1.20(H) 1.01(H) 0.96  Sodium 134 - 144 mmol/L 138 134(L) 138  Potassium 3.5 - 5.2 mmol/L 4.9 3.8 3.6  Chloride 96 - 106 mmol/L 94(L) 95(L) 99(L)  CO2 18 - 29 mmol/L 27 25 29   Calcium 8.7 - 10.2 mg/dL 10.2 9.2 10.0  Total Protein - - - -  Total Bilirubin - - - -  Alkaline Phos - - - -  AST - - - -  ALT - - - -     RADIOGRAPHIC STUDIES: I have personally reviewed the radiological images as listed and agreed with the findings in the report. No results found.  ASSESSMENT & PLAN:   55 year old female with morbid obesity, hypertension, diabetes with  #1 extensive bilateral pulmonary embolism. #2 bilateral lower extremity DVT  Patient was diagnosed with these in mid February 2018 and has been treated with Xarelto with improvement in her symptoms. The etiology of her venous thromboembolism can certainly be attributed to multiple acquired risk factors including  morbid obesity, secondary lifestyle, recently more immobile due to bilateral knee problems, significant respiratory infection preceding her venous thromboembolism.  No family history of venous thromboembolism. No previous personal history of venous thromboembolism. No exposure to hormonal risk factors. Nonsmoker.  Plan -Patient understands that she has multiple risk factors which will be ongoing risk factors for recurrent venous thromboembolism unless aggressively addressed. -We discussed that her current DVT and PE increase her risk for recurrent episodes. -We discussed the pros and cons and decided against pursuing a thrombophilia workup since her clot was quite likely provoked. -In general for provoked events that at this extensive we would proceed for 9-12 months (would prefer 12 months in her case) with active anticoagulation and then the patient could decide on switching to a daily aspirin long-term for VTE prophylaxis. -Xarelto has not been tested for dose adequacy/efficacy in patients about 130 kilograms body weight and so we discussed that it is unclear if this would provide optimal anticoagulation. -We discussed that in these situations we typically recommend an anticoagulant that can be measured. Her options would be to consider transitioning to Coumadin using a Lovenox bridge since Coumadin levels can be more accurately measured and titrated. -Based on clinical symptomatology she does not seem to be having overt clot progression on Xarelto at this time but would be at high risk for treatment failure.  Continue follow-up with her primary care physician. Return to clinic with Dr. Irene Limbo on as-needed basis  All of the patients questions were answered with apparent satisfaction. The patient knows to call the clinic with any problems, questions or concerns.  I spent 45 minutes counseling the patient face to face. The total time spent in the appointment was 60 minutes and more than 50% was  on counseling and direct patient cares.    Sullivan Lone MD [place] AAHIVMS [facility] Hematology/Oncology Physician [facility]  (Office):       [phone] (Work cell):  [phone] (Fax):           [phone]  08/17/2016 8:49 AM

## 2016-08-18 ENCOUNTER — Telehealth: Payer: Self-pay | Admitting: Hematology

## 2016-08-18 NOTE — Telephone Encounter (Signed)
4/12 NO los

## 2016-08-26 ENCOUNTER — Ambulatory Visit (INDEPENDENT_AMBULATORY_CARE_PROVIDER_SITE_OTHER): Payer: BC Managed Care – PPO | Admitting: Family Medicine

## 2016-08-26 VITALS — BP 108/68 | HR 110 | Temp 98.0°F | Resp 17 | Ht 64.0 in | Wt 377.4 lb

## 2016-08-26 DIAGNOSIS — M25562 Pain in left knee: Secondary | ICD-10-CM

## 2016-08-26 DIAGNOSIS — R7989 Other specified abnormal findings of blood chemistry: Secondary | ICD-10-CM | POA: Diagnosis not present

## 2016-08-26 DIAGNOSIS — M79605 Pain in left leg: Secondary | ICD-10-CM | POA: Diagnosis not present

## 2016-08-26 DIAGNOSIS — Z86718 Personal history of other venous thrombosis and embolism: Secondary | ICD-10-CM | POA: Diagnosis not present

## 2016-08-26 NOTE — Progress Notes (Signed)
Subjective:  By signing my name below, I, [name], attest that this documentation has been prepared under the direction and in the presence of [name], MD. Electronically Signed: [name], [place]. 08/26/2016 , 3:00 PM .  Patient was seen in Room 10 .   Patient ID: Natasha Rollins, female    DOB: [birth year], 56 y.o.   MRN: [id number] Chief Complaint  Patient presents with  . Follow-up    F/U from labwork for kidneys   HPI Natasha Rollins is a 56 y.o. female Here for follow up. See previous visits. She had bilateral DVT's and PE, diagnosed in February this year with persistent leg pain. She was treated with hydrocodone, tylenol and followed by ortho for knee pains. She's taking Xarelto as well. She was prescribed topical NSAID by her PCP for leg pain and reportedly had negative ultrasound for DVT by her PCP. She was having some muscle cramps at last visit on Mar 29th. She had a normal magnesium and potassium levels, and normal CK. We discussed possible neuro eval versus PT/ortho consideration of other treatment with persistent leg pains.   She hasn't seen Dr. Cheron Schaumann since her last visit with me. She's still applying NSAID topical cream over her legs. She's still taking hydrocodone, mostly every 6 hours and then takes tylenol. She is still having knee pain, but not much radiating down into her legs. She has some leg pain at night. She's been doing TENS unit by ortho.   Her hematologist wants to take her off xarelto, and switch her to Lovenox.   Elevated creatinine She had an elevated creatinine on last BMP of 1.20 on Mar 29th; this was increased from range of 0.96 to 1.01 from 2 months ago.   After her hospital visit, she noticed she wasn't urinating as much. Recently, her urination has been back to normal.   Patient Active Problem List   Diagnosis Date Noted  . Leukocytosis 06/21/2016  . Pulmonary emboli ([place]) 06/20/2016  . Diabetes ([place]) 12/22/2012  .  Hypertension 12/22/2012  . Morbid obesity with BMI of 60.0-69.9, adult ([place]) 07/08/2010  . OVARIAN CYST 07/08/2010   Past Medical History:  Diagnosis Date  . Diabetes mellitus without complication ([place])   . Hypertension    Past Surgical History:  Procedure Laterality Date  . CHOLECYSTECTOMY    . HERNIA REPAIR    . TONSILLECTOMY     Allergies  Allergen Reactions  . Penicillins Itching and Rash   Prior to Admission medications   Medication Sig Start Date End Date Taking? Authorizing Provider  acetaminophen (TYLENOL) 325 MG tablet Take 2 tablets (650 mg total) by mouth every 6 (six) hours as needed for mild pain, moderate pain or headache (or Fever >/= 101). 06/22/16  Yes Modena Jansky, MD  amLODipine (NORVASC) 10 MG tablet Take 10 mg by mouth daily. 06/07/14  Yes Historical Provider, MD  fluticasone (FLONASE) 50 MCG/ACT nasal spray Place 2 sprays into both nostrils daily. 05/31/16  Yes Elizabeth Whitney McVey, PA-C  folic acid (FOLVITE) 1 MG tablet Take 1 mg by mouth daily.   Yes Historical Provider, MD  HYDROcodone-acetaminophen (NORCO/VICODIN) 5-325 MG tablet Take 1 tablet by mouth every 6 (six) hours as needed for moderate pain. 08/03/16  Yes Wendie Agreste, MD  LEVEMIR FLEXTOUCH 100 UNIT/ML Pen Inject 75 Units into the skin 2 (two) times daily.  07/28/14  Yes Historical Provider, MD  NOVOLOG FLEXPEN 100 UNIT/ML FlexPen Inject 30 Units into the skin 3 (three)  times daily with meals.  07/28/14  Yes Historical Provider, MD  rivaroxaban (XARELTO) 20 MG TABS tablet Take 20 mg by mouth daily with supper.   Yes Historical Provider, MD  Rivaroxaban 15 & 20 MG TBPK Take as directed on package: Start with one 15mg  tablet by mouth twice a day with food. On Day 22, switch to one 20mg  tablet once a day with food.  Pharmacy: Please discard the first 2 tablets of the pack which she received in the hospital. 06/22/16  Yes Modena Jansky, MD  TRADJENTA 5 MG TABS tablet Take 5 mg by mouth daily.   07/10/14  Yes Historical Provider, MD  valsartan-hydrochlorothiazide (DIOVAN-HCT) 320-25 MG tablet Take 1 tablet by mouth daily. 06/13/16  Yes Historical Provider, MD  benzonatate (TESSALON) 100 MG capsule Take 1-2 capsules (100-200 mg total) by mouth 3 (three) times daily as needed for cough. Patient not taking: Reported on 08/26/2016 06/19/16   Wendie Agreste, MD  dextromethorphan-guaiFENesin [facility] DM) 30-600 MG 12hr tablet Take 1 tablet by mouth 2 (two) times daily as needed for cough.    Historical Provider, MD   Social History   Social History  . Marital status: Married    Spouse name: N/A  . Number of children: N/A  . Years of education: N/A   Occupational History  . Not on file.   Social History Main Topics  . Smoking status: Never Smoker  . Smokeless tobacco: Never Used  . Alcohol use No  . Drug use: No  . Sexual activity: Yes   Other Topics Concern  . Not on file   Social History Narrative  . No narrative on file   Review of Systems  Constitutional: Negative for fatigue and unexpected weight change.  Respiratory: Negative for chest tightness and shortness of breath.   Cardiovascular: Positive for leg swelling. Negative for chest pain and palpitations.  Gastrointestinal: Negative for abdominal pain and blood in stool.  Musculoskeletal: Positive for myalgias.  Neurological: Negative for dizziness, syncope, light-headedness and headaches.       Objective:   Physical Exam  Constitutional: She is oriented to person, place, and time. She appears well-developed and well-nourished.  HENT:  Head: Normocephalic and atraumatic.  Eyes: Conjunctivae and EOM are normal. Pupils are equal, round, and reactive to light.  Neck: Carotid bruit is not present.  Cardiovascular: Normal rate, regular rhythm, normal heart sounds and intact distal pulses.   Pulmonary/Chest: Effort normal and breath sounds normal.  Abdominal: Soft. She exhibits no pulsatile midline mass. There is no  tenderness.  Musculoskeletal:  1+ edema in her lower extremities, NVI distally at the toes; calves non tender  Neurological: She is alert and oriented to person, place, and time.  Skin: Skin is warm and dry.  Small area of superficial split at posterior fold of popliteal fossa  Psychiatric: She has a normal mood and affect. Her behavior is normal.  Vitals reviewed.   Vitals:   08/26/16 1420  BP: 108/68  Pulse: (!) 110  Resp: 17  Temp: 98 F (36.7 C)  TempSrc: Oral  SpO2: 98%  Weight: (!) 377 lb 6 oz (171.2 kg)  Height: 5\' 4"  (1.626 m)      Assessment & Plan:   Natasha Rollins is a 56 y.o. female Elevated serum creatinine - Plan: Basic metabolic panel  - Recently urinating more normally. Possible volume depletion previously. Repeat BMP, work on trying to maintain hydration without fluid overload.  Left leg pain Left knee pain,  unspecified chronicity  -Improved in calf, lower leg, still with significant knee pain, treated by orthopedics, now with TENS unit. Continue follow-up with orthopedics, RTC precautions if worsening calf symptoms or acute changes in swelling/calf pain.  History of DVT of lower extremity, with history of PE.  -Followed by hematology, treated with Xarelto, but now planning on Lovenox for treatment.   No orders of the defined types were placed in this encounter.  Patient Instructions    Continue to follow up with your hematologist, physical therapist, an orthopedist for the blood clot, and knee and leg pains. If you have new leg pain/calf swelling, or new/worsening symptoms, return here or emergency room to recheck.  I will repeat your kidney function test today to make sure that is not continuing to increase. If it is stable but slightly elevated, can discuss further monitoring. Make sure you're drinking plenty of fluids throughout the day to stay hydrated.   IF you received an x-ray today, you will receive an invoice from [facility].  Please contact [facility] at [phone] with questions or concerns regarding your invoice.   IF you received labwork today, you will receive an invoice from [place]. Please contact [facility] at [phone] with questions or concerns regarding your invoice.   Our billing staff will not be able to assist you with questions regarding bills from these companies.  You will be contacted with the lab results as soon as they are available. The fastest way to get your results is to activate your My Chart account. Instructions are located on the last page of this paperwork. If you have not heard from [place] regarding the results in 2 weeks, please contact this office.       I personally performed the services described in this documentation, which was scribed in my presence. The recorded information has been reviewed and considered for accuracy and completeness, addended by me as needed, and agree with information above.  Signed,   [name], MD [facility] [place].  08/27/16 10:37 PM

## 2016-08-26 NOTE — Patient Instructions (Addendum)
  Continue to follow up with your hematologist, physical therapist, an orthopedist for the blood clot, and knee and leg pains. If you have new leg pain/calf swelling, or new/worsening symptoms, return here or emergency room to recheck.  I will repeat your kidney function test today to make sure that is not continuing to increase. If it is stable but slightly elevated, can discuss further monitoring. Make sure you're drinking plenty of fluids throughout the day to stay hydrated.   IF you received an x-ray today, you will receive an invoice from [facility]. Please contact [facility] at [phone] with questions or concerns regarding your invoice.   IF you received labwork today, you will receive an invoice from [place]. Please contact [facility] at [phone] with questions or concerns regarding your invoice.   Our billing staff will not be able to assist you with questions regarding bills from these companies.  You will be contacted with the lab results as soon as they are available. The fastest way to get your results is to activate your My Chart account. Instructions are located on the last page of this paperwork. If you have not heard from [place] regarding the results in 2 weeks, please contact this office.

## 2016-08-27 LAB — BASIC METABOLIC PANEL
BUN/Creatinine Ratio: 23 (ref 9–23)
BUN: 27 mg/dL — ABNORMAL HIGH (ref 6–24)
CALCIUM: 10.2 mg/dL (ref 8.7–10.2)
CO2: 27 mmol/L (ref 18–29)
CREATININE: 1.15 mg/dL — AB (ref 0.57–1.00)
Chloride: 95 mmol/L — ABNORMAL LOW (ref 96–106)
GFR calc non Af Amer: 53 mL/min/{1.73_m2} — ABNORMAL LOW (ref >59)
GFR, EST AFRICAN AMERICAN: 61 mL/min/{1.73_m2} (ref >59)
Glucose: 127 mg/dL — ABNORMAL HIGH (ref 65–99)
Potassium: 4.6 mmol/L (ref 3.5–5.2)
Sodium: 138 mmol/L (ref 134–144)

## 2016-09-06 ENCOUNTER — Ambulatory Visit: Payer: BC Managed Care – PPO

## 2016-09-07 ENCOUNTER — Ambulatory Visit: Payer: BC Managed Care – PPO | Admitting: Family Medicine

## 2016-09-11 ENCOUNTER — Encounter: Payer: Self-pay | Admitting: Family Medicine

## 2016-09-11 ENCOUNTER — Ambulatory Visit (INDEPENDENT_AMBULATORY_CARE_PROVIDER_SITE_OTHER): Payer: BC Managed Care – PPO | Admitting: Family Medicine

## 2016-09-11 VITALS — BP 125/78 | HR 104 | Temp 98.4°F | Resp 18 | Ht 64.0 in | Wt 379.4 lb

## 2016-09-11 DIAGNOSIS — S301XXA Contusion of abdominal wall, initial encounter: Secondary | ICD-10-CM | POA: Diagnosis not present

## 2016-09-11 DIAGNOSIS — I824Z3 Acute embolism and thrombosis of unspecified deep veins of distal lower extremity, bilateral: Secondary | ICD-10-CM

## 2016-09-11 DIAGNOSIS — I2699 Other pulmonary embolism without acute cor pulmonale: Secondary | ICD-10-CM | POA: Diagnosis not present

## 2016-09-11 MED ORDER — ENOXAPARIN SODIUM 150 MG/ML ~~LOC~~ SOLN: 150.0000 mg | Freq: Two times a day (BID) | SUBCUTANEOUS | 0 refills | Status: DC

## 2016-09-11 MED ORDER — WARFARIN SODIUM 5 MG PO TABS: 5.0000 mg | ORAL_TABLET | Freq: Every day | ORAL | 0 refills | Status: AC

## 2016-09-11 NOTE — Patient Instructions (Addendum)
In reviewing your hematologist notes, it was intended that the Lovenox would be used to bridge you to Coumadin. Continue Lovenox twice per day for now, I will check a baseline PT/INR, and start Coumadin 5mg  once per day. Return Friday Morning for repeat testing and to discuss plan further.   I will order an ultrasound of the abdomen to look at the swollen areas, but I suspect some of that may be due to bruising from the injections, not a true allergy, and does not appear to be infection. Tylenol okay to use for now, warm compress if that feels better, and recheck in 4 days.   Return to the clinic or go to the nearest emergency room if any of your symptoms worsen or new symptoms occur.   Bleeding Precautions When on Anticoagulant Therapy WHAT IS ANTICOAGULANT THERAPY? Anticoagulant therapy is taking medicine to prevent or reduce blood clots. It is also called blood thinner therapy. Blood clots that form in your blood vessels can be dangerous. They can break loose and travel to your heart, lungs, or brain. This increases your risk of a heart attack or stroke. Anticoagulant therapy causes blood to clot more slowly. You may need anticoagulant therapy if you have:  A medical condition that increases the likelihood that blood clots will form.  A heart defect or a problem with heart rhythm. It is also a common treatment after heart surgery, such as valve replacement. WHAT ARE COMMON TYPES OF ANTICOAGULANT THERAPY? Anticoagulant medicine can be injected or taken by mouth.If you need anticoagulant therapy quickly at the hospital, the medicine may be injected under your skin or given through an IV tube. Heparin is a common example of an anticoagulant that you may get at the hospital. Most anticoagulant therapy is in the form of pills that you take at home every day. These may include:  Aspirin. This common blood thinner works by preventing blood cells (platelets) from sticking together to form a clot.  Aspirin is not as strong as anticoagulants that slow down the time that it takes for your body to form a clot.  Clopidogrel. This is a newer type of drug that affects platelets. It is stronger than aspirin.  Warfarin. This is the most common anticoagulant. It changes the way your body uses vitamin K, a vitamin that helps your blood to clot. The risk of bleeding is higher with warfarin than with aspirin. You will need frequent blood tests to make sure you are taking the safest amount.  New anticoagulants. Several new drugs have been approved. They are all taken by mouth. Studies show that these drugs work as well as warfarin. They do not require blood testing. They may cause less bleeding risk than warfarin. WHAT DO I NEED TO REMEMBER WHEN TAKING ANTICOAGULANT THERAPY? Anticoagulant therapy decreases your risk of forming a blood clot, but it increases your risk of bleeding. Work closely with your health care provider to make sure you are taking your medicine safely. These tips can help:  Learn ways to reduce your risk of bleeding.  If you are taking warfarin:  Have blood tests as ordered by your health care provider.  Do not make any sudden changes to your diet. Vitamin K in your diet can make warfarin less effective.  Do not get pregnant. This medicine may cause birth defects.  Take your medicine at the same time every day. If you forget to take your medicine, take it as soon as you remember. If you miss a whole  day, do not double your dose of medicine. Take your normal dose and call your health care provider to check in.  Do not stop taking your medicine on your own.  Tell your health care provider before you start taking any new medicine, vitamin, or herbal product. Some of these could interfere with your therapy.  Tell all of your health care providers that you are on anticoagulant therapy.  Do not have surgery, medical procedures, or dental work until you tell your health care  provider that you are on anticoagulant therapy. WHAT CAN AFFECT HOW ANTICOAGULANTS WORK? Certain foods, vitamins, medicines, supplements, and herbal medicines change the way that anticoagulant therapy works. They may increase or decrease the effects of your anticoagulant therapy. Either result can be dangerous for you.  Many over-the-counter medicines for pain, colds, or stomach problems interfere with anticoagulant therapy. Take these only as told by your health care provider.  Do not drink alcohol. It can interfere with your medicine and increase your risk of an injury that causes bleeding.  If you are taking warfarin, do not begin eating more foods that contain vitamin K. These include leafy green vegetables. Ask your health care provider if you should avoid any foods. WHAT ARE SOME WAYS TO PREVENT BLEEDING? You can prevent bleeding by taking certain precautions:  Be extra careful when you use knives, scissors, or other sharp objects.  Use an electric razor instead of a blade.  Do not use toothpicks.  Use a soft toothbrush.  Wear shoes that have nonskid soles.  Use bath mats and handrails in your bathroom.  Wear gloves while you do yard work.  Wear a helmet when you ride a bike.  Wear your seat belt.  Prevent falls by removing loose rugs and extension cords from areas where you walk.  Do not play contact sports or participate in other activities that have a high risk of injury. [place] PROVIDER? Call your health care provider if:  You miss a dose of medicine:  And you are not sure what to do.  For more than one day.  You have:  Menstrual bleeding that is heavier than normal.  Blood in your urine.  A bloody nose or bleeding gums.  Easy bruising.  Blood in your stool (feces) or have black and tarry stool.  Side effects from your medicine.  You feel weak or dizzy.  You become pregnant. Seek immediate medical care if:  You have  bleeding that will not stop.  You have sudden and severe headache or belly pain.  You vomit or you cough up bright red blood.  You have a severe blow to your head. WHAT ARE SOME QUESTIONS TO ASK MY HEALTH CARE PROVIDER?  What is the best anticoagulant therapy for my condition?  What side effects should I watch for?  When should I take my medicine? What should I do if I forget to take it?  Will I need to have regular blood tests?  Do I need to change my diet? Are there foods or drinks that I should avoid?  What activities are safe for me?  What should I do if I want to get pregnant? This information is not intended to replace advice given to you by your health care provider. Make sure you discuss any questions you have with your health care provider. Document Released: 04/05/2015 Document Reviewed: 04/05/2015 Elsevier Interactive Patient Education  2017 [employer].     We recommend that  you schedule a mammogram for breast cancer screening. Typically, you do not need a referral to do this. Please contact a local imaging center to schedule your mammogram.  [facility] - [phone]  *ask for the Radiology Department [facility] [place] ([place]) - [phone] or [phone]  [facility] - [phone] [place] [phone] [facility] - [phone]  *ask for the [facility] - [phone]  *ask for the Radiology Department [facility] - [phone]  *ask for the [place] - [phone]    IF you received an x-ray today, you will receive an invoice from [facility]. Please contact [facility] at [phone] with questions or concerns regarding your invoice.   IF you received labwork today, you will receive an invoice from [facility]. Please contact [facility] at [phone] with questions or concerns  regarding your invoice.   Our billing staff will not be able to assist you with questions regarding bills from these companies.  You will be contacted with the lab results as soon as they are available. The fastest way to get your results is to activate your My Chart account. Instructions are located on the last page of this paperwork. If you have not heard from [place] regarding the results in 2 weeks, please contact this office.

## 2016-09-11 NOTE — Progress Notes (Signed)
By signing my name below, I, [name], attest that this documentation has been prepared under the direction and in the presence of [name], MD.  Electronically Signed: [name], Medical Scribe. 09/11/16. 4:44 PM.  Subjective:    Patient ID: Natasha Rollins, female    DOB: 1960-10-14, 56 y.o.   MRN: [id number]  HPI Chief Complaint  Patient presents with  . Follow-up    leg pain, abnormal labs; patient made two phone calls while being triaged, CMA stepped out of the room until she was finished    HPI Comments: Natasha Rollins is a 56 y.o. female who presents to [facility] Care at [facility] complaining of nodules in her stomach where her lovenox injections are. She had a PMHx of bilateral DVT and PE not followed by hematology. On lovenox as of last discussion on 08/26/16. Had been on xarelto previously.  Leg Pain, with muscle cramps: See previous visits. She had a nl CK, magnesium and potassium. Thought to be in part knee pain, and possible radiating pain followed by orthopedics. Had been treated with tenzius and prev with topical diclofenac. She had also been treated with physical therapy forleg pain  Elevated Creatinine: Up to 1.20 1 month ago, improved to 1.15 2 weeks ago. Potassium was nl at that time. Planned for repat BMP in 6 weeks Lab Results  Component Value Date   CREATININE 1.15 (H) 08/26/2016   Medication; Lovanox: Planned on 9-12 month before anticoagulation before deciding to switch to daily ASA. There was question about xarelto use in pts over 130 kgs. He recommended coumadin with lovanox bridge. Pt is taking lovenox 150 mg every 12 hours and suspects she is allergic to lovenox due to numbness and tinglign in her fingers after taking it. She also reports associated sxs of a lump at every injection site, sleep disturbance from the pain felt after the injection, darkening skin, and bruising. Pt gives self injections of levemir and novolog for her DM in the same  area as her lovenox injections as well as on her arms and her arms don't bruise or cause nodules to form.   Patient Active Problem List   Diagnosis Date Noted  . Leukocytosis 06/21/2016  . Pulmonary emboli ([employer]) 06/20/2016  . Diabetes ([place]) 12/22/2012  . Hypertension 12/22/2012  . Morbid obesity with BMI of 60.0-69.9, adult ([place]) 07/08/2010  . OVARIAN CYST 07/08/2010   Past Medical History:  Diagnosis Date  . Diabetes mellitus without complication ([place])   . Hypertension    Past Surgical History:  Procedure Laterality Date  . CHOLECYSTECTOMY    . HERNIA REPAIR    . TONSILLECTOMY     Allergies  Allergen Reactions  . Penicillins Itching and Rash   Prior to Admission medications   Medication Sig Start Date End Date Taking? Authorizing Provider  acetaminophen (TYLENOL) 325 MG tablet Take 2 tablets (650 mg total) by mouth every 6 (six) hours as needed for mild pain, moderate pain or headache (or Fever >/= 101). 06/22/16   Hongalgi, Lenis Dickinson, MD  amLODipine (NORVASC) 10 MG tablet Take 10 mg by mouth daily. 06/07/14   [provider]  dextromethorphan-guaiFENesin (MUCINEX DM) 30-600 MG 12hr tablet Take 1 tablet by mouth 2 (two) times daily as needed for cough.    [provider]  fluticasone (FLONASE) 50 MCG/ACT nasal spray Place 2 sprays into both nostrils daily. 05/31/16   McVey, Gelene Mink, PA-C  folic acid (FOLVITE) 1 MG tablet Take 1 mg by mouth daily.  [provider]  HYDROcodone-acetaminophen (NORCO/VICODIN) 5-325 MG tablet Take 1 tablet by mouth every 6 (six) hours as needed for moderate pain. 08/03/16   Wendie Agreste, MD  LEVEMIR FLEXTOUCH 100 UNIT/ML Pen Inject 75 Units into the skin 2 (two) times daily.  07/28/14   [provider]  NOVOLOG FLEXPEN 100 UNIT/ML FlexPen Inject 30 Units into the skin 3 (three) times daily with meals.  07/28/14   [provider]  rivaroxaban (XARELTO) 20 MG TABS tablet Take 20 mg by mouth daily  with supper.    [provider]  Rivaroxaban 15 & 20 MG TBPK Take as directed on package: Start with one 15mg  tablet by mouth twice a day with food. On Day 22, switch to one 20mg  tablet once a day with food.  Pharmacy: Please discard the first 2 tablets of the pack which she received in the hospital. 06/22/16   Hongalgi, Lenis Dickinson, MD  TRADJENTA 5 MG TABS tablet Take 5 mg by mouth daily.  07/10/14   [provider]  valsartan-hydrochlorothiazide (DIOVAN-HCT) 320-25 MG tablet Take 1 tablet by mouth daily. 06/13/16   [provider]   Social History   Social History  . Marital status: Married    Spouse name: N/A  . Number of children: N/A  . Years of education: N/A   Occupational History  . Not on file.   Social History Main Topics  . Smoking status: Never Smoker  . Smokeless tobacco: Never Used  . Alcohol use No  . Drug use: No  . Sexual activity: Yes   Other Topics Concern  . Not on file   Social History Narrative  . No narrative on file   Review of Systems  Skin: Positive for color change.  Neurological: Positive for numbness.  Hematological: Bruises/bleeds easily.  Psychiatric/Behavioral: Positive for sleep disturbance.    Objective:  Physical Exam  Constitutional: She appears well-developed and well-nourished. No distress.  HENT:  Head: Normocephalic and atraumatic.  Eyes: Conjunctivae are normal.  Neck: Neck supple.  Cardiovascular: Normal rate, regular rhythm and normal heart sounds.  Exam reveals no gallop and no friction rub.   No murmur heard. Pulmonary/Chest: Effort normal and breath sounds normal. No respiratory distress. She has no wheezes. She has no rales.  Neurological: She is alert.  Skin: Skin is warm and dry.  Multiple areas of bruising with some induration of those areas across abdominal wall No appreciable erythema or warmth from those areas Some areas are indurated between 4-10 cm  Psychiatric: She has a normal mood and  affect. Her behavior is normal.  Nursing note and vitals reviewed.   Vitals:   09/11/16 1623  BP: 125/78  Pulse: (!) 104  Resp: 18  Temp: 98.4 F (36.9 C)  TempSrc: Oral  SpO2: 98%  Weight: (!) 379 lb 6.4 oz (172.1 kg)  Height: 5\' 4"  (1.626 m)   Body mass index is 65.12 kg/m. Assessment & Plan:    Natasha Rollins is a 56 y.o. female Deep vein thrombosis (DVT) of distal vein of both lower extremities, unspecified chronicity ([place]) - Plan: Protime-INR, Protime-INR Other pulmonary embolism without acute cor pulmonale, unspecified chronicity ([place]) - Plan: Protime-INR, Protime-INR  - Appears to have been some misunderstanding regarding plan of Lovenox and bridging to Coumadin. She has only been using Lovenox since visit with hematologist.   -start Coumadin 5 mg daily, check PT and INR, continue Lovenox as bridge for now, and repeat PT/INR in 4 days.  Abdominal wall hematoma, initial encounter - Plan: US Abdomen Limited  -Possible hematomas from Lovenox injections. Will check ultrasound to evaluate areas further, warm compresses, Tylenol discussed for symptomatic care in the meantime. RTC precautions  Meds ordered this encounter  Medications  . DISCONTD: enoxaparin (LOVENOX) 150 MG/ML injection    Sig: Inject 150 mg into the skin every 12 (twelve) hours.  Marland Kitchen warfarin (COUMADIN) 5 MG tablet    Sig: Take 1 tablet (5 mg total) by mouth daily at 6 PM.    Dispense:  30 tablet    Refill:  0  . DISCONTD: enoxaparin (LOVENOX) 150 MG/ML injection    Sig: Inject 1 mL (150 mg total) into the skin every 12 (twelve) hours.    Dispense:  10 mL    Refill:  0  . enoxaparin (LOVENOX) 150 MG/ML injection    Sig: Inject 1 mL (150 mg total) into the skin every 12 (twelve) hours.    Dispense:  14 mL    Refill:  0   Patient Instructions   In reviewing your hematologist notes, it was intended that the Lovenox would be used to bridge you to Coumadin. Continue Lovenox twice per day for now, I will  check a baseline PT/INR, and start Coumadin 5mg  once per day. Return Friday Morning for repeat testing and to discuss plan further.   I will order an ultrasound of the abdomen to look at the swollen areas, but I suspect some of that may be due to bruising from the injections, not a true allergy, and does not appear to be infection. Tylenol okay to use for now, warm compress if that feels better, and recheck in 4 days.   Return to the clinic or go to the nearest emergency room if any of your symptoms worsen or new symptoms occur.   Bleeding Precautions When on Anticoagulant Therapy WHAT IS ANTICOAGULANT THERAPY? Anticoagulant therapy is taking medicine to prevent or reduce blood clots. It is also called blood thinner therapy. Blood clots that form in your blood vessels can be dangerous. They can break loose and travel to your heart, lungs, or brain. This increases your risk of a heart attack or stroke. Anticoagulant therapy causes blood to clot more slowly. You may need anticoagulant therapy if you have:  A medical condition that increases the likelihood that blood clots will form.  A heart defect or a problem with heart rhythm. It is also a common treatment after heart surgery, such as valve replacement. WHAT ARE COMMON TYPES OF ANTICOAGULANT THERAPY? Anticoagulant medicine can be injected or taken by mouth.If you need anticoagulant therapy quickly at the hospital, the medicine may be injected under your skin or given through an IV tube. Heparin is a common example of an anticoagulant that you may get at the hospital. Most anticoagulant therapy is in the form of pills that you take at home every day. These may include:  Aspirin. This common blood thinner works by preventing blood cells (platelets) from sticking together to form a clot. Aspirin is not as strong as anticoagulants that slow down the time that it takes for your body to form a clot.  Clopidogrel. This is a newer type of drug that  affects platelets. It is stronger than aspirin.  Warfarin. This is the most common anticoagulant. It changes the way your body uses vitamin K, a vitamin that helps your blood to clot. The risk of bleeding is higher with warfarin than with aspirin. You will need frequent blood  tests to make sure you are taking the safest amount.  New anticoagulants. Several new drugs have been approved. They are all taken by mouth. Studies show that these drugs work as well as warfarin. They do not require blood testing. They may cause less bleeding risk than warfarin. WHAT DO I NEED TO REMEMBER WHEN TAKING ANTICOAGULANT THERAPY? Anticoagulant therapy decreases your risk of forming a blood clot, but it increases your risk of bleeding. Work closely with your health care provider to make sure you are taking your medicine safely. These tips can help:  Learn ways to reduce your risk of bleeding.  If you are taking warfarin:  Have blood tests as ordered by your health care provider.  Do not make any sudden changes to your diet. Vitamin K in your diet can make warfarin less effective.  Do not get pregnant. This medicine may cause birth defects.  Take your medicine at the same time every day. If you forget to take your medicine, take it as soon as you remember. If you miss a whole day, do not double your dose of medicine. Take your normal dose and call your health care provider to check in.  Do not stop taking your medicine on your own.  Tell your health care provider before you start taking any new medicine, vitamin, or herbal product. Some of these could interfere with your therapy.  Tell all of your health care providers that you are on anticoagulant therapy.  Do not have surgery, medical procedures, or dental work until you tell your health care provider that you are on anticoagulant therapy. WHAT CAN AFFECT HOW ANTICOAGULANTS WORK? Certain foods, vitamins, medicines, supplements, and herbal medicines change  the way that anticoagulant therapy works. They may increase or decrease the effects of your anticoagulant therapy. Either result can be dangerous for you.  Many over-the-counter medicines for pain, colds, or stomach problems interfere with anticoagulant therapy. Take these only as told by your health care provider.  Do not drink alcohol. It can interfere with your medicine and increase your risk of an injury that causes bleeding.  If you are taking warfarin, do not begin eating more foods that contain vitamin K. These include leafy green vegetables. Ask your health care provider if you should avoid any foods. WHAT ARE SOME WAYS TO PREVENT BLEEDING? You can prevent bleeding by taking certain precautions:  Be extra careful when you use knives, scissors, or other sharp objects.  Use an electric razor instead of a blade.  Do not use toothpicks.  Use a soft toothbrush.  Wear shoes that have nonskid soles.  Use bath mats and handrails in your bathroom.  Wear gloves while you do yard work.  Wear a helmet when you ride a bike.  Wear your seat belt.  Prevent falls by removing loose rugs and extension cords from areas where you walk.  Do not play contact sports or participate in other activities that have a high risk of injury. [place] PROVIDER? Call your health care provider if:  You miss a dose of medicine:  And you are not sure what to do.  For more than one day.  You have:  Menstrual bleeding that is heavier than normal.  Blood in your urine.  A bloody nose or bleeding gums.  Easy bruising.  Blood in your stool (feces) or have black and tarry stool.  Side effects from your medicine.  You feel weak or dizzy.  You become pregnant.  Seek immediate medical care if:  You have bleeding that will not stop.  You have sudden and severe headache or belly pain.  You vomit or you cough up bright red blood.  You have a severe blow to your  head. WHAT ARE SOME QUESTIONS TO ASK MY HEALTH CARE PROVIDER?  What is the best anticoagulant therapy for my condition?  What side effects should I watch for?  When should I take my medicine? What should I do if I forget to take it?  Will I need to have regular blood tests?  Do I need to change my diet? Are there foods or drinks that I should avoid?  What activities are safe for me?  What should I do if I want to get pregnant? This information is not intended to replace advice given to you by your health care provider. Make sure you discuss any questions you have with your health care provider. Document Released: 04/05/2015 Document Reviewed: 04/05/2015 Elsevier Interactive Patient Education  2017 [employer].     We recommend that you schedule a mammogram for breast cancer screening. Typically, you do not need a referral to do this. Please contact a local imaging center to schedule your mammogram.  [facility] - [phone]  *ask for the Radiology Department [facility] ([place]) - [phone] or [phone]  [facility] - [phone] [place] [phone] [facility] - [phone]  *ask for the [facility] - [phone]  *ask for the Radiology Department [facility] - [phone]  *ask for the [place] - [phone]    IF you received an x-ray today, you will receive an invoice from [facility]. Please contact [facility] at [phone] with questions or concerns regarding your invoice.   IF you received labwork today, you will receive an invoice from [facility]. Please contact [facility] at [phone] with questions or concerns regarding your invoice.   Our billing staff will not be able to assist you with questions regarding bills from these companies.  You will be contacted with the lab  results as soon as they are available. The fastest way to get your results is to activate your My Chart account. Instructions are located on the last page of this paperwork. If you have not heard from [place] regarding the results in 2 weeks, please contact this office.      I personally performed the services described in this documentation, which was scribed in my presence. The recorded information has been reviewed and considered for accuracy and completeness, addended by me as needed, and agree with information above.  Signed,   [name], MD [facility] at [place].  09/11/16 6:18 PM

## 2016-09-12 LAB — PROTIME-INR
INR: 1.1 (ref 0.8–1.2)
PROTHROMBIN TIME: 11.2 s (ref 9.1–12.0)

## 2016-09-22 ENCOUNTER — Ambulatory Visit
Admission: RE | Admit: 2016-09-22 | Discharge: 2016-09-22 | Disposition: A | Discharge status: Home / Self Care | Payer: BC Managed Care – PPO | Source: Ambulatory Visit | Attending: Family Medicine | Admitting: Family Medicine

## 2016-09-22 DIAGNOSIS — S301XXA Contusion of abdominal wall, initial encounter: Secondary | ICD-10-CM

## 2016-09-29 ENCOUNTER — Encounter: Payer: Self-pay | Admitting: Family Medicine

## 2016-09-29 ENCOUNTER — Ambulatory Visit (INDEPENDENT_AMBULATORY_CARE_PROVIDER_SITE_OTHER): Payer: BC Managed Care – PPO | Admitting: Family Medicine

## 2016-09-29 VITALS — BP 129/74 | HR 116 | Temp 98.5°F | Resp 16 | Ht 64.0 in | Wt 381.0 lb

## 2016-09-29 DIAGNOSIS — R000 Tachycardia, unspecified: Secondary | ICD-10-CM

## 2016-09-29 DIAGNOSIS — E119 Type 2 diabetes mellitus without complications: Secondary | ICD-10-CM | POA: Diagnosis not present

## 2016-09-29 DIAGNOSIS — S3011XD Contusion of abdominal wall, subsequent encounter: Secondary | ICD-10-CM

## 2016-09-29 DIAGNOSIS — Z794 Long term (current) use of insulin: Secondary | ICD-10-CM

## 2016-09-29 DIAGNOSIS — S301XXD Contusion of abdominal wall, subsequent encounter: Secondary | ICD-10-CM | POA: Diagnosis not present

## 2016-09-29 DIAGNOSIS — R110 Nausea: Secondary | ICD-10-CM

## 2016-09-29 LAB — POCT CBC
GRANULOCYTE PERCENT: 65 % (ref 37–80)
HEMATOCRIT: 30.9 % — AB (ref 37.7–47.9)
HEMOGLOBIN: 10.5 g/dL — AB (ref 12.2–16.2)
Lymph, poc: 2.7 (ref 0.6–3.4)
MCH, POC: 29.3 pg (ref 27–31.2)
MCHC: 34.1 g/dL (ref 31.8–35.4)
MCV: 85.9 fL (ref 80–97)
MID (cbc): 0.5 (ref 0–0.9)
MPV: 7 fL (ref 0–99.8)
POC GRANULOCYTE: 6.1 (ref 2–6.9)
POC LYMPH %: 29.2 % (ref 10–50)
POC MID %: 5.8 % (ref 0–12)
Platelet Count, POC: 448 10*3/uL — AB (ref 142–424)
RBC: 3.59 M/uL — AB (ref 4.04–5.48)
RDW, POC: 16.4 %
WBC: 9.4 10*3/uL (ref 4.6–10.2)

## 2016-09-29 NOTE — Progress Notes (Signed)
By signing my name below, I, [name], attest that this documentation has been prepared under the direction and in the presence of [name], MD.  Electronically Signed: [name], Medical Scribe. 09/29/16. 5:50 PM.  Subjective:    Patient ID: Natasha Rollins, female    DOB: [birth year], 56 y.o.   MRN: [id number]  HPI Chief Complaint  Patient presents with  . Follow-up    labs, meds    HPI Comments: Natasha Rollins is a 56 y.o. female who presents to Primary Care at [facility] for follow-up.   DVT, and PE: See previous notes. She had been intially treated with xarelto, but met with hematology who wanted her to transition to lovenox and coumadin, as unsure of xarelto use in pt's over 130 kgs. She had been just using lovenox every 12 hours, did not know to start coumadin. I started her on coumadin 5 mg QD and advised to return in 4 days for repeat PT and INR. She has not return with me since 09/11/16, but has been followed by her primary provider Dr. Stephanie Acre.   Reports Occasional cramps in lower leg, but only noted at night. No new swelling or new calf pains. Denies calf pain during the day.   The first time her INR was checked with Dr. Cheron Schaumann it was 1.6, then it increased to 1.9 and this week it was at "4.something". She was taken off of lovenox 2 days ago, and was told to exchange between 5 mg to 10 mg of coumadin every other day. Pt has a follow-up with Dr. Cheron Schaumann in 4 days, next Tuesday. Pt was recommended to consider changing off warfarin and too see a new hematologist at [facility] to discuss options. Denies dysuria, hematuria, cough, SOB, and chest pain.  Abdominal Wall Swelling: Suspected hematomas from lovenox injections when evaluated at last visit. [place] on May 18th indicated 15 areas largest 7.3 cm, suspected injection related hematoma vs abscess. See telephone notes on result. She reportedly had fever and was advsied to return to the ER Sep 27 2016. Nl TSH in Feb  2018.  Reports subjective intermittent fever(her stomach feels warm), and intermittent nausea 2x a week without emesis. History of cholecystectomy, denies alcohol use. Pt still has painful hematomas on her abdomen. Pt usually goes to the bathroom every hour, and one day she went only 2x without other urinary sxs. Pt blood sugar has been reading around 124. Pt has never had any diabetic complications with her DM.  Patient Active Problem List   Diagnosis Date Noted  . Leukocytosis 06/21/2016  . Pulmonary emboli ([place]) 06/20/2016  . Diabetes ([place]) 12/22/2012  . Hypertension 12/22/2012  . Morbid obesity with BMI of 60.0-69.9, adult ([place]) 07/08/2010  . OVARIAN CYST 07/08/2010   Past Medical History:  Diagnosis Date  . Diabetes mellitus without complication ([place])   . Hypertension    Past Surgical History:  Procedure Laterality Date  . CHOLECYSTECTOMY    . HERNIA REPAIR    . TONSILLECTOMY     Allergies  Allergen Reactions  . Penicillins Itching and Rash   Prior to Admission medications   Medication Sig Start Date End Date Taking? Authorizing Provider  acetaminophen (TYLENOL) 325 MG tablet Take 2 tablets (650 mg total) by mouth every 6 (six) hours as needed for mild pain, moderate pain or headache (or Fever >/= 101). 06/22/16  Yes Hongalgi, Lenis Dickinson, MD  amLODipine (NORVASC) 10 MG tablet Take 10 mg by mouth daily. 06/07/14  Yes [provider]  folic acid (FOLVITE) 1 MG tablet Take 1 mg by mouth daily.   Yes [provider]  LEVEMIR FLEXTOUCH 100 UNIT/ML Pen Inject 75 Units into the skin 2 (two) times daily.  07/28/14  Yes [provider]  NOVOLOG FLEXPEN 100 UNIT/ML FlexPen Inject 30 Units into the skin 3 (three) times daily with meals.  07/28/14  Yes [provider]  TRADJENTA 5 MG TABS tablet Take 5 mg by mouth daily.  07/10/14  Yes [provider]  valsartan-hydrochlorothiazide (DIOVAN-HCT) 320-25 MG tablet Take 1 tablet by mouth daily. 06/13/16  Yes  [provider]  warfarin (COUMADIN) 5 MG tablet Take 1 tablet (5 mg total) by mouth daily at 6 PM. 09/11/16 09/12/16  Wendie Agreste, MD   Social History   Social History  . Marital status: Married    Spouse name: N/A  . Number of children: N/A  . Years of education: N/A   Occupational History  . Not on file.   Social History Main Topics  . Smoking status: Never Smoker  . Smokeless tobacco: Never Used  . Alcohol use No  . Drug use: No  . Sexual activity: Yes   Other Topics Concern  . Not on file   Social History Narrative  . No narrative on file   Review of Systems  Constitutional: Positive for fever.  Respiratory: Negative for cough and shortness of breath.   Cardiovascular: Positive for leg swelling. Negative for chest pain.  Gastrointestinal: Positive for abdominal pain and nausea. Negative for vomiting.  Genitourinary: Negative for dysuria and hematuria.  Musculoskeletal: Positive for myalgias.  Skin: Positive for color change.   Objective:  Physical Exam  Constitutional: She appears well-developed and well-nourished. No distress.  HENT:  Head: Normocephalic and atraumatic.  Eyes: Conjunctivae are normal.  Neck: Neck supple.  Cardiovascular: Regular rhythm.  Tachycardia present.  Exam reveals no gallop and no friction rub.   No murmur heard. Pulmonary/Chest: Effort normal and breath sounds normal. No respiratory distress. She has no wheezes. She has no rales.  Abdominal: There is no tenderness (RUQ, no focal tenderness).  Calves non tender  Neurological: She is alert.  Skin: Skin is warm and dry. Ecchymosis noted.  Hyperpigmentation with some firm areas throughout abdominal wall, diffusely on right and left side with some ecchymosis of skin of abdominal wall  Psychiatric: She has a normal mood and affect. Her behavior is normal.  Nursing note and vitals reviewed.  Amelia Jo Vitals:   09/29/16 1658 09/29/16 1727  BP: 129/74   Pulse: (!) 114 (!) 116    Resp: 16   Temp: 98.5 F (36.9 C)   TempSrc: Oral   SpO2: 97%   Weight: (!) 381 lb (172.8 kg)   Height: 5' 4" (1.626 m)   Body mass index is 65.4 kg/m. Assessment & Plan:   Natasha Rollins is a 56 y.o. female Abdominal wall hematoma, subsequent encounter - Plan: POCT CBC, Ambulatory referral to General Surgery  -Suspected hematomas, CBC reassuring. Refer to general surgery for options, but likely should improve now that she is off Lovenox. Warm compresses as needed. Watch for secondary signs of infection in the skin.  Nausea without vomiting - Plan: POCT CBC, CMP14+EGFR  -Rare/intermittent. Check CMP, RTC precautions if worsening or more frequent  Type 2 diabetes mellitus without complication, with long-term current use of insulin (HCC)  - Check CMP with history of diabetes and intermittent nausea, but reported glucose at home overall stable.  Tachycardia -  Plan: TSH, Ambulatory referral to Cardiology  -Persistent tachycardia, mild anemia but unlikely primary cause of tachycardia. History of PE, DVT, but now few months past that diagnosis. Refer to cardiology for further evaluation.   -check tsh, labs above. Prior tsh ok.  No orders of the defined types were placed in this encounter.  Patient Instructions   Follow up with your other provider regarding coumadin dosing and levels.  I would discuss other medication changes with hematologist or new hematologist at [facility].  If you are only having calf pains at night, and in both calves, less likely new blood clot. However if increasing swelling or calf pain persists throughout the day, can schedule repeat ultrasound.   I will refer you to the general surgeon to discuss treatment options for the hematomas on your abdominal wall.  Those are less likely infection, but if you do have fevers, worsening pain or new areas of swelling around the abdomen, would recommend evaluation here or emergency room as CT scan may be needed.   See  information on nausea below. I will check some electrolytes on your blood work today, but if vomiting or worsening symptoms, be seen here or the emergency room.  I will check some blood work for your persistently elevated heart rate. I would like you to meet with a cardiologist, as I would expect that to be improved from previous blood clots that were thought to be a cause of elevated heart rate.    Sinus Tachycardia Sinus tachycardia is a kind of fast heartbeat. In sinus tachycardia, the heart beats more than 100 times a minute. Sinus tachycardia starts in a part of the heart called the sinus node. Sinus tachycardia may be harmless, or it may be a sign of a serious condition. What are the causes? This condition may be caused by:  Exercise or exertion.  A fever.  Pain.  Loss of body fluids (dehydration).  Severe bleeding (hemorrhage).  Anxiety and stress.  Certain substances, including:  Alcohol.  Caffeine.  Tobacco and nicotine products.  Diet pills.  Illegal drugs.  Medical conditions including:  Heart disease.  An infection.  An overactive thyroid (hyperthyroidism).  A lack of red blood cells (anemia). What are the signs or symptoms? Symptoms of this condition include:  A feeling that the heart is beating quickly (palpitations).  Suddenly noticing your heartbeat (cardiac awareness).  Dizziness.  Tiredness (fatigue).  Shortness of breath.  Chest pain.  Nausea.  Fainting. How is this diagnosed? This condition is diagnosed with:  A physical exam.  Other tests, such as:  Blood tests.  An electrocardiogram (ECG). This test measures the electrical activity of the heart.  Holter monitoring. For this test, you wear a device that records your heartbeat for one or more days. You may be referred to a heart specialist (cardiologist). How is this treated? Treatment for this condition depends on the cause or underlying condition. Treatment may  involve:  Treating the underlying condition.  Taking new medicines or changing your current medicines as told by your health care provider.  Making changes to your diet or lifestyle.  Practicing relaxation methods. Follow these instructions at home: Lifestyle   Do not use any products that contain nicotine or tobacco, such as cigarettes and e-cigarettes. If you need help quitting, ask your health care provider.  Learn relaxation methods, like deep breathing, to help you when you get stressed or anxious.  Do not use illegal drugs, such as cocaine.  Do not abuse alcohol.  Limit alcohol intake to no more than 1 drink a day for non-pregnant women and 2 drinks a day for men. One drink equals 12 oz of beer, 5 oz of wine, or 1 oz of hard liquor.  Find time to rest and relax often. This reduces stress.  Avoid:  Caffeine.  Stimulants such as over-the-counter diet pills or pills that help you to stay awake.  Situations that cause anxiety or stress. General instructions   Drink enough fluids to keep your urine clear or pale yellow.  Take over-the-counter and prescription medicines only as told by your health care provider.  Keep all follow-up visits as told by your health care provider. This is important. Contact a health care provider if:  You have a fever.  You have vomiting or diarrhea that keeps happening (is persistent). Get help right away if:  You have pain in your chest, upper arms, jaw, or neck.  You become weak or dizzy.  You feel faint.  You have palpitations that do not go away. This information is not intended to replace advice given to you by your health care provider. Make sure you discuss any questions you have with your health care provider. Document Released: 06/01/2004 Document Revised: 11/20/2015 Document Reviewed: 11/06/2014 Elsevier Interactive Patient Education  2017 [employer]   Nausea, Adult Nausea is the feeling of an upset stomach or having  to vomit. Nausea on its own is not usually a serious concern, but it may be an early sign of a more serious medical problem. As nausea gets worse, it can lead to vomiting. If vomiting develops, or if you are not able to drink enough fluids, you are at risk of becoming dehydrated. Dehydration can make you tired and thirsty, cause you to have a dry mouth, and decrease how often you urinate. Older adults and people with other diseases or a weak immune system are at higher risk for dehydration. The main goals of treating your nausea are:  To limit repeated nausea episodes.  To prevent vomiting and dehydration. Follow these instructions at home: Follow instructions from your health care provider about how to care for yourself at home. Eating and drinking  Follow these recommendations as told by your health care provider:  Take an oral rehydration solution (ORS). This is a drink that is sold at pharmacies and retail stores.  Drink clear fluids in small amounts as you are able. Clear fluids include water, ice chips, diluted fruit juice, and low-calorie sports drinks.  Eat bland, easy-to-digest foods in small amounts as you are able. These foods include bananas, applesauce, rice, lean meats, toast, and crackers.  Avoid drinking fluids that contain a lot of sugar or caffeine, such as energy drinks, sports drinks, and soda.  Avoid alcohol.  Avoid spicy or fatty foods. General instructions   Drink enough fluid to keep your urine clear or pale yellow.  Wash your hands often. If soap and water are not available, use hand sanitizer.  Make sure that all people in your household wash their hands well and often.  Rest at home while you recover.  Take over-the-counter and prescription medicines only as told by your health care provider.  Breathe slowly and deeply when you feel nauseous.  Watch your condition for any changes.  Keep all follow-up visits as told by your health care provider. This is  important. Contact a health care provider if:  You have a headache.  You have new symptoms.  Your nausea gets worse.  You  have a fever.  You feel light-headed or dizzy.  You vomit.  You cannot keep fluids down. Get help right away if:  You have pain in your chest, neck, arm, or jaw.  You feel extremely weak or you faint.  You have vomit that is bright red or looks like coffee grounds.  You have bloody or black stools or stools that look like tar.  You have a severe headache, a stiff neck, or both.  You have severe pain, cramping, or bloating in your abdomen.  You have a rash.  You have difficulty breathing or are breathing very quickly.  Your heart is beating very quickly.  Your skin feels cold and clammy.  You feel confused.  You have pain when you urinate.  You have signs of dehydration, such as:  Dark urine, very little, or no urine.  Cracked lips.  Dry mouth.  Sunken eyes.  Sleepiness.  Weakness. These symptoms may represent a serious problem that is an emergency. Do not wait to see if the symptoms will go away. Get medical help right away. Call your local emergency services (911 in the [place] Do not drive yourself to the hospital. This information is not intended to replace advice given to you by your health care provider. Make sure you discuss any questions you have with your health care provider. Document Released: 06/01/2004 Document Revised: 09/27/2015 Document Reviewed: 12/29/2014 Elsevier Interactive Patient Education  2017 [employer].    IF you received an x-ray today, you will receive an invoice from [facility]. Please contact [facility] at [phone] with questions or concerns regarding your invoice.   IF you received labwork today, you will receive an invoice from [facility]. Please contact [facility] at [phone] with questions or concerns regarding your invoice.   Our billing staff will not be able to assist  you with questions regarding bills from these companies.  You will be contacted with the lab results as soon as they are available. The fastest way to get your results is to activate your My Chart account. Instructions are located on the last page of this paperwork. If you have not heard from [place] regarding the results in 2 weeks, please contact this office.      I personally performed the services described in this documentation, which was scribed in my presence. The recorded information has been reviewed and considered for accuracy and completeness, addended by me as needed, and agree with information above.  Signed,   [name], MD [facility] at [place].  10/02/16 2:38 PM

## 2016-09-29 NOTE — Patient Instructions (Addendum)
Follow up with your other provider regarding coumadin dosing and levels.  I would discuss other medication changes with hematologist or new hematologist at [facility].  If you are only having calf pains at night, and in both calves, less likely new blood clot. However if increasing swelling or calf pain persists throughout the day, can schedule repeat ultrasound.   I will refer you to the general surgeon to discuss treatment options for the hematomas on your abdominal wall.  Those are less likely infection, but if you do have fevers, worsening pain or new areas of swelling around the abdomen, would recommend evaluation here or emergency room as CT scan may be needed.   See information on nausea below. I will check some electrolytes on your blood work today, but if vomiting or worsening symptoms, be seen here or the emergency room.  I will check some blood work for your persistently elevated heart rate. I would like you to meet with a cardiologist, as I would expect that to be improved from previous blood clots that were thought to be a cause of elevated heart rate.    Sinus Tachycardia Sinus tachycardia is a kind of fast heartbeat. In sinus tachycardia, the heart beats more than 100 times a minute. Sinus tachycardia starts in a part of the heart called the sinus node. Sinus tachycardia may be harmless, or it may be a sign of a serious condition. What are the causes? This condition may be caused by:  Exercise or exertion.  A fever.  Pain.  Loss of body fluids (dehydration).  Severe bleeding (hemorrhage).  Anxiety and stress.  Certain substances, including:  Alcohol.  Caffeine.  Tobacco and nicotine products.  Diet pills.  Illegal drugs.  Medical conditions including:  Heart disease.  An infection.  An overactive thyroid (hyperthyroidism).  A lack of red blood cells (anemia). What are the signs or symptoms? Symptoms of this condition include:  A feeling that the heart is  beating quickly (palpitations).  Suddenly noticing your heartbeat (cardiac awareness).  Dizziness.  Tiredness (fatigue).  Shortness of breath.  Chest pain.  Nausea.  Fainting. How is this diagnosed? This condition is diagnosed with:  A physical exam.  Other tests, such as:  Blood tests.  An electrocardiogram (ECG). This test measures the electrical activity of the heart.  Holter monitoring. For this test, you wear a device that records your heartbeat for one or more days. You may be referred to a heart specialist (cardiologist). How is this treated? Treatment for this condition depends on the cause or underlying condition. Treatment may involve:  Treating the underlying condition.  Taking new medicines or changing your current medicines as told by your health care provider.  Making changes to your diet or lifestyle.  Practicing relaxation methods. Follow these instructions at home: Lifestyle   Do not use any products that contain nicotine or tobacco, such as cigarettes and e-cigarettes. If you need help quitting, ask your health care provider.  Learn relaxation methods, like deep breathing, to help you when you get stressed or anxious.  Do not use illegal drugs, such as cocaine.  Do not abuse alcohol. Limit alcohol intake to no more than 1 drink a day for non-pregnant women and 2 drinks a day for men. One drink equals 12 oz of beer, 5 oz of wine, or 1 oz of hard liquor.  Find time to rest and relax often. This reduces stress.  Avoid:  Caffeine.  Stimulants such as over-the-counter diet pills or  pills that help you to stay awake.  Situations that cause anxiety or stress. General instructions   Drink enough fluids to keep your urine clear or pale yellow.  Take over-the-counter and prescription medicines only as told by your health care provider.  Keep all follow-up visits as told by your health care provider. This is important. Contact a health care  provider if:  You have a fever.  You have vomiting or diarrhea that keeps happening (is persistent). Get help right away if:  You have pain in your chest, upper arms, jaw, or neck.  You become weak or dizzy.  You feel faint.  You have palpitations that do not go away. This information is not intended to replace advice given to you by your health care provider. Make sure you discuss any questions you have with your health care provider. Document Released: 06/01/2004 Document Revised: 11/20/2015 Document Reviewed: 11/06/2014 Elsevier Interactive Patient Education  2017 [employer]   Nausea, Adult Nausea is the feeling of an upset stomach or having to vomit. Nausea on its own is not usually a serious concern, but it may be an early sign of a more serious medical problem. As nausea gets worse, it can lead to vomiting. If vomiting develops, or if you are not able to drink enough fluids, you are at risk of becoming dehydrated. Dehydration can make you tired and thirsty, cause you to have a dry mouth, and decrease how often you urinate. Older adults and people with other diseases or a weak immune system are at higher risk for dehydration. The main goals of treating your nausea are:  To limit repeated nausea episodes.  To prevent vomiting and dehydration. Follow these instructions at home: Follow instructions from your health care provider about how to care for yourself at home. Eating and drinking  Follow these recommendations as told by your health care provider:  Take an oral rehydration solution (ORS). This is a drink that is sold at pharmacies and retail stores.  Drink clear fluids in small amounts as you are able. Clear fluids include water, ice chips, diluted fruit juice, and low-calorie sports drinks.  Eat bland, easy-to-digest foods in small amounts as you are able. These foods include bananas, applesauce, rice, lean meats, toast, and crackers.  Avoid drinking fluids that  contain a lot of sugar or caffeine, such as energy drinks, sports drinks, and soda.  Avoid alcohol.  Avoid spicy or fatty foods. General instructions   Drink enough fluid to keep your urine clear or pale yellow.  Wash your hands often. If soap and water are not available, use hand sanitizer.  Make sure that all people in your household wash their hands well and often.  Rest at home while you recover.  Take over-the-counter and prescription medicines only as told by your health care provider.  Breathe slowly and deeply when you feel nauseous.  Watch your condition for any changes.  Keep all follow-up visits as told by your health care provider. This is important. Contact a health care provider if:  You have a headache.  You have new symptoms.  Your nausea gets worse.  You have a fever.  You feel light-headed or dizzy.  You vomit.  You cannot keep fluids down. Get help right away if:  You have pain in your chest, neck, arm, or jaw.  You feel extremely weak or you faint.  You have vomit that is bright red or looks like coffee grounds.  You have bloody or black  stools or stools that look like tar.  You have a severe headache, a stiff neck, or both.  You have severe pain, cramping, or bloating in your abdomen.  You have a rash.  You have difficulty breathing or are breathing very quickly.  Your heart is beating very quickly.  Your skin feels cold and clammy.  You feel confused.  You have pain when you urinate.  You have signs of dehydration, such as:  Dark urine, very little, or no urine.  Cracked lips.  Dry mouth.  Sunken eyes.  Sleepiness.  Weakness. These symptoms may represent a serious problem that is an emergency. Do not wait to see if the symptoms will go away. Get medical help right away. Call your local emergency services (911 in the [place] Do not drive yourself to the hospital. This information is not intended to replace advice given  to you by your health care provider. Make sure you discuss any questions you have with your health care provider. Document Released: 06/01/2004 Document Revised: 09/27/2015 Document Reviewed: 12/29/2014 Elsevier Interactive Patient Education  2017 [employer].    IF you received an x-ray today, you will receive an invoice from [facility]. Please contact [facility] at [phone] with questions or concerns regarding your invoice.   IF you received labwork today, you will receive an invoice from [place]. Please contact [facility] at [phone] with questions or concerns regarding your invoice.   Our billing staff will not be able to assist you with questions regarding bills from these companies.  You will be contacted with the lab results as soon as they are available. The fastest way to get your results is to activate your My Chart account. Instructions are located on the last page of this paperwork. If you have not heard from [place] regarding the results in 2 weeks, please contact this office.

## 2016-09-30 LAB — CMP14+EGFR
A/G RATIO: 1.1 — AB (ref 1.2–2.2)
ALBUMIN: 3.9 g/dL (ref 3.5–5.5)
ALT: 34 IU/L — ABNORMAL HIGH (ref 0–32)
AST: 27 IU/L (ref 0–40)
Alkaline Phosphatase: 72 IU/L (ref 39–117)
BILIRUBIN TOTAL: 0.3 mg/dL (ref 0.0–1.2)
BUN / CREAT RATIO: 18 (ref 9–23)
BUN: 23 mg/dL (ref 6–24)
CALCIUM: 9.5 mg/dL (ref 8.7–10.2)
CHLORIDE: 96 mmol/L (ref 96–106)
CO2: 23 mmol/L (ref 18–29)
Creatinine, Ser: 1.28 mg/dL — ABNORMAL HIGH (ref 0.57–1.00)
GFR, EST AFRICAN AMERICAN: 54 mL/min/{1.73_m2} — AB (ref >59)
GFR, EST NON AFRICAN AMERICAN: 47 mL/min/{1.73_m2} — AB (ref >59)
GLOBULIN, TOTAL: 3.5 g/dL (ref 1.5–4.5)
Glucose: 135 mg/dL — ABNORMAL HIGH (ref 65–99)
POTASSIUM: 4.2 mmol/L (ref 3.5–5.2)
SODIUM: 137 mmol/L (ref 134–144)
TOTAL PROTEIN: 7.4 g/dL (ref 6.0–8.5)

## 2016-09-30 LAB — TSH: TSH: 2.13 u[IU]/mL (ref 0.450–4.500)

## 2016-10-12 ENCOUNTER — Ambulatory Visit: Payer: BC Managed Care – PPO | Admitting: Family Medicine

## 2016-10-14 ENCOUNTER — Other Ambulatory Visit (INDEPENDENT_AMBULATORY_CARE_PROVIDER_SITE_OTHER): Payer: BC Managed Care – PPO | Admitting: Physician Assistant

## 2016-10-14 DIAGNOSIS — I2699 Other pulmonary embolism without acute cor pulmonale: Secondary | ICD-10-CM

## 2016-10-14 DIAGNOSIS — I824Z3 Acute embolism and thrombosis of unspecified deep veins of distal lower extremity, bilateral: Secondary | ICD-10-CM

## 2016-10-15 LAB — PROTIME-INR
INR: 2 — AB (ref 0.8–1.2)
Prothrombin Time: 19.9 s — ABNORMAL HIGH (ref 9.1–12.0)

## 2017-02-02 ENCOUNTER — Other Ambulatory Visit: Payer: Self-pay | Admitting: Cardiology

## 2017-02-02 DIAGNOSIS — E138 Other specified diabetes mellitus with unspecified complications: Secondary | ICD-10-CM

## 2017-02-02 DIAGNOSIS — I10 Essential (primary) hypertension: Secondary | ICD-10-CM

## 2017-02-12 ENCOUNTER — Ambulatory Visit (HOSPITAL_COMMUNITY)
Admission: RE | Admit: 2017-02-12 | Discharge: 2017-02-12 | Disposition: A | Discharge status: Home / Self Care | Payer: BC Managed Care – PPO | Source: Ambulatory Visit | Attending: Cardiology | Admitting: Cardiology

## 2017-02-12 DIAGNOSIS — E138 Other specified diabetes mellitus with unspecified complications: Secondary | ICD-10-CM | POA: Diagnosis present

## 2017-02-12 DIAGNOSIS — I10 Essential (primary) hypertension: Secondary | ICD-10-CM | POA: Insufficient documentation

## 2017-02-12 MED ORDER — REGADENOSON 0.4 MG/5ML IV SOLN
INTRAVENOUS | Status: AC
  Administered 2017-02-12: 0.4 mg via INTRAVENOUS
  Filled 2017-02-12: qty 5

## 2017-02-12 MED ORDER — REGADENOSON 0.4 MG/5ML IV SOLN
0.4000 mg | Freq: Once | INTRAVENOUS | Status: AC
  Administered 2017-02-12: 0.4 mg via INTRAVENOUS

## 2017-02-13 ENCOUNTER — Ambulatory Visit (HOSPITAL_COMMUNITY)
Admission: RE | Admit: 2017-02-13 | Discharge: 2017-02-13 | Disposition: A | Discharge status: Home / Self Care | Payer: BC Managed Care – PPO | Source: Ambulatory Visit | Attending: Cardiology | Admitting: Cardiology

## 2017-02-13 DIAGNOSIS — R000 Tachycardia, unspecified: Secondary | ICD-10-CM | POA: Insufficient documentation

## 2017-02-13 DIAGNOSIS — I10 Essential (primary) hypertension: Secondary | ICD-10-CM | POA: Diagnosis present

## 2017-02-13 DIAGNOSIS — E138 Other specified diabetes mellitus with unspecified complications: Secondary | ICD-10-CM | POA: Insufficient documentation

## 2017-02-13 MED ORDER — TECHNETIUM TC 99M TETROFOSMIN IV KIT
31.0000 | PACK | Freq: Once | INTRAVENOUS | Status: AC | PRN
  Administered 2017-02-13: 31 via INTRAVENOUS

## 2017-10-11 ENCOUNTER — Other Ambulatory Visit: Payer: Self-pay | Admitting: Family Medicine

## 2017-10-12 ENCOUNTER — Other Ambulatory Visit: Payer: Self-pay | Admitting: Family Medicine

## 2017-10-12 DIAGNOSIS — R1909 Other intra-abdominal and pelvic swelling, mass and lump: Secondary | ICD-10-CM

## 2017-11-19 ENCOUNTER — Other Ambulatory Visit: Payer: BC Managed Care – PPO

## 2017-11-23 ENCOUNTER — Ambulatory Visit
Admission: RE | Admit: 2017-11-23 | Discharge: 2017-11-23 | Disposition: A | Discharge status: Home / Self Care | Payer: BC Managed Care – PPO | Source: Ambulatory Visit | Attending: Family Medicine | Admitting: Family Medicine

## 2017-11-23 DIAGNOSIS — R1909 Other intra-abdominal and pelvic swelling, mass and lump: Secondary | ICD-10-CM

## 2017-12-03 ENCOUNTER — Other Ambulatory Visit: Payer: Self-pay | Admitting: Obstetrics and Gynecology

## 2017-12-07 DIAGNOSIS — M79671 Pain in right foot: Secondary | ICD-10-CM | POA: Insufficient documentation

## 2018-02-18 ENCOUNTER — Other Ambulatory Visit: Payer: Self-pay | Admitting: Family Medicine

## 2018-02-18 DIAGNOSIS — R1909 Other intra-abdominal and pelvic swelling, mass and lump: Secondary | ICD-10-CM

## 2018-02-19 ENCOUNTER — Other Ambulatory Visit: Payer: Self-pay

## 2018-02-19 ENCOUNTER — Encounter

## 2018-02-19 ENCOUNTER — Ambulatory Visit: Payer: BC Managed Care – PPO | Admitting: Podiatry

## 2018-02-19 ENCOUNTER — Encounter: Payer: Self-pay | Admitting: Podiatry

## 2018-02-19 ENCOUNTER — Encounter: Payer: Self-pay | Admitting: Physician Assistant

## 2018-02-19 ENCOUNTER — Ambulatory Visit (INDEPENDENT_AMBULATORY_CARE_PROVIDER_SITE_OTHER): Payer: BC Managed Care – PPO | Admitting: Physician Assistant

## 2018-02-19 VITALS — BP 142/78 | HR 80 | Temp 97.7°F | Resp 16 | Ht 63.0 in | Wt 373.0 lb

## 2018-02-19 VITALS — Temp 99.2°F

## 2018-02-19 DIAGNOSIS — L02611 Cutaneous abscess of right foot: Secondary | ICD-10-CM | POA: Diagnosis not present

## 2018-02-19 DIAGNOSIS — L03031 Cellulitis of right toe: Secondary | ICD-10-CM | POA: Diagnosis not present

## 2018-02-19 DIAGNOSIS — I82403 Acute embolism and thrombosis of unspecified deep veins of lower extremity, bilateral: Secondary | ICD-10-CM | POA: Insufficient documentation

## 2018-02-19 DIAGNOSIS — L600 Ingrowing nail: Secondary | ICD-10-CM

## 2018-02-19 MED ORDER — MUPIROCIN 2 % EX OINT: 1.0000 "application " | TOPICAL_OINTMENT | Freq: Two times a day (BID) | CUTANEOUS | 2 refills | Status: DC

## 2018-02-19 MED ORDER — CEPHALEXIN 500 MG PO CAPS: 500.0000 mg | ORAL_CAPSULE | Freq: Four times a day (QID) | ORAL | 0 refills | Status: DC

## 2018-02-19 NOTE — Patient Instructions (Addendum)
Start antibiotics as directed by your primary care doctor    ---------   Place 1/4 cup of epsom salts in a quart of warm tap water.  Submerge your foot or feet in the solution and soak for 20 minutes.  This soak should be done twice a day.  Next, remove your foot or feet from solution, blot dry the affected area. Apply ointment and cover if instructed by your doctor.   IF YOUR SKIN BECOMES IRRITATED WHILE USING THESE INSTRUCTIONS, IT IS OKAY TO SWITCH TO  WHITE VINEGAR AND WATER.  As another alternative soak, you may use antibacterial soap and water.  Monitor for any signs/symptoms of infection. Call the office immediately if any occur or go directly to the emergency room. Call with any questions/concerns.

## 2018-02-19 NOTE — Patient Instructions (Addendum)
Start taking keflex four times/day for the next 5 days.   Soak the foot in warm, soapy water. Do this for 10 to 20 minutes, 2 to 3 times a day for 1 to 2 weeks. You can also use 1 to 2 teaspoons of Epsom salts (available in drug stores) in the water instead of soap.  You will receive a phone call to schedule an appointment with podiatry.   If you have lab work done today you will be contacted with your lab results within the next 2 weeks.  If you have not heard from [place] then please contact us. The fastest way to get your results is to register for My Chart.   IF you received an x-ray today, you will receive an invoice from [facility]. Please contact [facility] at [phone] with questions or concerns regarding your invoice.   IF you received labwork today, you will receive an invoice from [place]. Please contact [facility] at [phone] with questions or concerns regarding your invoice.   Our billing staff will not be able to assist you with questions regarding bills from these companies.  You will be contacted with the lab results as soon as they are available. The fastest way to get your results is to activate your My Chart account. Instructions are located on the last page of this paperwork. If you have not heard from [place] regarding the results in 2 weeks, please contact this office.

## 2018-02-19 NOTE — Progress Notes (Signed)
   Natasha Rollins  MRN: [id number] DOB: 09/01/1960  PCP: Jonathon Jordan, MD  Subjective:  Pt is a 57 year old female who presents to clinic for deformity of toe nail.  The nail of her second right toe split a few days ago and she ripped part of the nail off yesterday. Since that time she endorses pain of her toe. She feels like part of the nail is still stuck in her toe.   No MOI.   H/o DM. Last A1C 6.1 (per pt) No results found for: HGBA1C  Review of Systems  Constitutional: Negative for chills and fever.  Skin: Positive for wound.    Patient Active Problem List   Diagnosis Date Noted  . Leukocytosis 06/21/2016  . Pulmonary emboli ([place]) 06/20/2016  . Diabetes ([place]) 12/22/2012  . Hypertension 12/22/2012  . Morbid obesity with BMI of 60.0-69.9, adult ([place]) 07/08/2010  . OVARIAN CYST 07/08/2010    Current Outpatient Medications on File Prior to Visit  Medication Sig Dispense Refill  . acetaminophen (TYLENOL) 325 MG tablet Take 2 tablets (650 mg total) by mouth every 6 (six) hours as needed for mild pain, moderate pain or headache (or Fever >/= 101).    Marland Kitchen amLODipine (NORVASC) 10 MG tablet Take 10 mg by mouth daily.  8  . folic acid (FOLVITE) 1 MG tablet Take 1 mg by mouth daily.    Marland Kitchen LEVEMIR FLEXTOUCH 100 UNIT/ML Pen Inject 75 Units into the skin 2 (two) times daily.     Marland Kitchen NOVOLOG FLEXPEN 100 UNIT/ML FlexPen Inject 30 Units into the skin 3 (three) times daily with meals.     . TRADJENTA 5 MG TABS tablet Take 5 mg by mouth daily.   5  . valsartan-hydrochlorothiazide (DIOVAN-HCT) 320-25 MG tablet Take 1 tablet by mouth daily.    Marland Kitchen warfarin (COUMADIN) 5 MG tablet Take 1 tablet (5 mg total) by mouth daily at 6 PM. 30 tablet 0   No current facility-administered medications on file prior to visit.     Allergies  Allergen Reactions  . Penicillins Itching and Rash     Objective:  BP (!) 142/78 (BP Location: Right Arm, Patient Position: Sitting, Cuff Size: Large)   Pulse 80    Temp 97.7 F (36.5 C) (Oral)   Resp 16   Ht 5\' 3"  (1.6 m)   Wt (!) 373 lb (169.2 kg)   SpO2 98%   BMI 66.07 kg/m   Physical Exam  Constitutional: She appears well-developed.  morbidly obese  Musculoskeletal:       Feet:  Feet:  Right Foot:  Skin Integrity: Positive for blister and warmth.  Vitals reviewed.   Assessment and Plan :  1. Paronychia of toe of right foot - Pt c/o toe pain with no known MOI. Suspect paronychia vs herpetic whitlow. Cultures taken. Due to erythema and TTP will treat with keflex. Will refer to podiatry for evaluation.  - cephALEXin (KEFLEX) 500 MG capsule; Take 1 capsule (500 mg total) by mouth 4 (four) times daily for 5 days.  Dispense: 20 capsule; Refill: 0 - Ambulatory referral to Podiatry - Herpes simplex virus culture - WOUND CULTURE   [facility], PA-C  [facility] 02/19/2018 11:18 AM  Please note: Portions of this report may have been transcribed using dragon voice recognition software. Every effort was made to ensure accuracy; however, inadvertent computerized transcription errors may be present.

## 2018-02-21 LAB — WOUND CULTURE

## 2018-02-21 LAB — HERPES SIMPLEX VIRUS CULTURE

## 2018-02-22 LAB — WOUND CULTURE
MICRO NUMBER: 91238557
SPECIMEN QUALITY:: ADEQUATE

## 2018-02-23 ENCOUNTER — Other Ambulatory Visit: Payer: Self-pay | Admitting: Podiatry

## 2018-02-23 MED ORDER — CLINDAMYCIN HCL 300 MG PO CAPS: 300.0000 mg | ORAL_CAPSULE | Freq: Three times a day (TID) | ORAL | 0 refills | Status: DC

## 2018-02-23 NOTE — Progress Notes (Signed)
Spoke with patient today and stated that the culture came back with MRSA and per Dr Jacqualyn Posey need to change the antibiotics and that she needs to stop the keflex and start the clindamycin and to call the office if any concerns or questions and that we would see patient next Tuesday for a follow up. Natasha Rollins

## 2018-02-25 NOTE — Progress Notes (Signed)
Subjective:   Patient ID: [name], female   DOB: 56 y.o.   MRN: [id number]   HPI 57 year old female presents the office today for concerns of infection of the right second toe.  She was just seen in the urgent care for this as well and she was given Keflex for antibiotics but she has not yet started this.  She states this started after she try to cut her toenail herself and she may cut the skin.  She has noticed some swelling and redness to the toe.  Denies any red streaks.  She has no other concerns today.   Review of Systems  All other systems reviewed and are negative.  Past Medical History:  Diagnosis Date  . Diabetes mellitus without complication ([place])   . Hypertension     Past Surgical History:  Procedure Laterality Date  . CHOLECYSTECTOMY    . HERNIA REPAIR    . TONSILLECTOMY       Current Outpatient Medications:  .  acetaminophen (TYLENOL) 325 MG tablet, Take 2 tablets (650 mg total) by mouth every 6 (six) hours as needed for mild pain, moderate pain or headache (or Fever >/= 101)., Disp: , Rfl:  .  amLODipine (NORVASC) 10 MG tablet, Take 10 mg by mouth daily., Disp: , Rfl: 8 .  clindamycin (CLEOCIN) 300 MG capsule, Take 1 capsule (300 mg total) by mouth 3 (three) times daily., Disp: 30 capsule, Rfl: 0 .  folic acid (FOLVITE) 1 MG tablet, Take 1 mg by mouth daily., Disp: , Rfl:  .  LEVEMIR FLEXTOUCH 100 UNIT/ML Pen, Inject 75 Units into the skin 2 (two) times daily. , Disp: , Rfl:  .  mupirocin ointment (BACTROBAN) 2 %, Apply 1 application topically 2 (two) times daily., Disp: 30 g, Rfl: 2 .  NOVOLOG FLEXPEN 100 UNIT/ML FlexPen, Inject 30 Units into the skin 3 (three) times daily with meals. , Disp: , Rfl:  .  TRADJENTA 5 MG TABS tablet, Take 5 mg by mouth daily. , Disp: , Rfl: 5 .  valsartan-hydrochlorothiazide (DIOVAN-HCT) 320-25 MG tablet, Take 1 tablet by mouth daily., Disp: , Rfl:  .  warfarin (COUMADIN) 5 MG tablet, Take 1 tablet (5 mg total) by mouth daily  at 6 PM., Disp: 30 tablet, Rfl: 0  Allergies  Allergen Reactions  . Penicillins Itching and Rash         Objective:  Physical Exam  General: AAO x3, NAD  Dermatological: There is incurvation present along the medial aspect the right second toenail with some dried blood from she try to cut the skin.  There is a blister around the toenail proximal aspect.  There is edema and erythema to the distal portion of the toe.  No fluctuation or crepitation.  The toenail itself is split.  Vascular: Dorsalis Pedis artery and Posterior Tibial artery pedal pulses are 2/4 bilateral with immedate capillary fill time.  There is no pain with calf compression, swelling, warmth, erythema.   Neruologic: Grossly intact via light touch bilateral. Protective threshold with Semmes Wienstein monofilament intact to all pedal sites bilateral.   Musculoskeletal: No gross boney pedal deformities bilateral. No pain, crepitus, or limitation noted with foot and ankle range of motion bilateral. Muscular strength 5/5 in all groups tested bilateral.  Gait: Unassisted, Nonantalgic.      Assessment:   Right second digit toenail infection with cellulitis of the toe     Plan:  -Treatment options discussed including all alternatives, risks, and complications -Etiology of symptoms  were discussed -Given the infection of the toenail I recommend toenail avulsion.  Discussed risks and complications of the procedure and she wished to proceed and written consent was obtained.  The toe was anesthetized with a mixture of 3 cc of lidocaine and Marcaine plain.  An additional 2 cc of Carbocaine was infiltrated for additional anesthesia.  The right second toe was then prepped in sterile fashion.  The right second digit toenail was then removed and there was purulence identified from the toenail and along the area of the blister.  This area was cultured.  Once at the nail off I debrided all nonviable tissue there is no exposed bone or  tendon.  The wound base appeared to be healthy.  The wound was irrigated with alcohol.  Hemostasis achieved.  Betadine ointment was applied followed by dry sterile dressing.  Following procedure there is immediate capillary refill time noted to the toe.  She tolerated the procedure well and complications. -For now start the antibiotics.  I will call her once the results of the culture obtained. -Surgical shoe for offloading.  *X-ray next appointment  Trula Slade DPM

## 2018-02-26 ENCOUNTER — Ambulatory Visit (INDEPENDENT_AMBULATORY_CARE_PROVIDER_SITE_OTHER): Payer: Self-pay | Admitting: Podiatry

## 2018-02-26 ENCOUNTER — Ambulatory Visit (INDEPENDENT_AMBULATORY_CARE_PROVIDER_SITE_OTHER): Payer: BC Managed Care – PPO

## 2018-02-26 DIAGNOSIS — L02611 Cutaneous abscess of right foot: Secondary | ICD-10-CM

## 2018-02-26 DIAGNOSIS — L03031 Cellulitis of right toe: Secondary | ICD-10-CM

## 2018-02-26 DIAGNOSIS — L600 Ingrowing nail: Secondary | ICD-10-CM

## 2018-02-27 ENCOUNTER — Encounter: Payer: Self-pay | Admitting: Physician Assistant

## 2018-03-03 NOTE — Progress Notes (Signed)
Subjective: 57 year old female presents to the office today for follow-up evaluation of right 2nd toe infection.  Overall she states that she is doing better but she is concerned about why we changed her antibiotics.  We called her over from the weekend to change the antibiotics given her culture results.  She is tolerating the antibiotics well without any side effects.  She denies any drainage of pus or any increase in swelling or redness.  She has an occasional throbbing to her second toe but overall is improving. Denies any systemic complaints such as fevers, chills, nausea, vomiting. No acute changes since last appointment, and no other complaints at this time.   Objective: AAO x3, NAD DP/PT pulses palpable bilaterally, CRT less than 3 seconds Right second toe with minimal edema there is no significant erythema there is no ascending cellulitis.  There is no fluctuation or crepitation or any malodor.  The nailbed appears to be healing well and scab starting to form.  There is no pain upon palpation to the area there is no fluctuation or crepitation. No open lesions or pre-ulcerative lesions.  No pain with calf compression, swelling, warmth, erythema  Assessment: Healing wound, resolving infection right second toe; MRSA  Plan: -All treatment options discussed with the patient including all alternatives, risks, complications.  -Clean the wound today without any complications and there is no purulence identified.  We will continue daily dressing changes as discussed and continue with surgical shoe.  Finish course of clindamycin. -Monitor for any clinical signs or symptoms of infection and directed to call the office immediately should any occur or go to the ER. -Patient encouraged to call the office with any questions, concerns, change in symptoms.   Trula Slade DPM

## 2018-03-06 ENCOUNTER — Other Ambulatory Visit: Payer: BC Managed Care – PPO

## 2018-03-07 ENCOUNTER — Ambulatory Visit (INDEPENDENT_AMBULATORY_CARE_PROVIDER_SITE_OTHER): Payer: BC Managed Care – PPO | Admitting: Podiatry

## 2018-03-07 ENCOUNTER — Encounter

## 2018-03-07 DIAGNOSIS — L02611 Cutaneous abscess of right foot: Secondary | ICD-10-CM

## 2018-03-07 DIAGNOSIS — L03031 Cellulitis of right toe: Secondary | ICD-10-CM | POA: Diagnosis not present

## 2018-03-07 DIAGNOSIS — L97511 Non-pressure chronic ulcer of other part of right foot limited to breakdown of skin: Secondary | ICD-10-CM

## 2018-03-07 MED ORDER — DOXYCYCLINE HYCLATE 100 MG PO TABS: 100.0000 mg | ORAL_TABLET | Freq: Two times a day (BID) | ORAL | 0 refills | Status: DC

## 2018-03-07 NOTE — Progress Notes (Signed)
Subjective: 57 year old female presents to the office today for follow-up evaluation of right 2nd toe infection.  She states that she did not get any injection because the infection in her foot.  She was told by her orthopedic doctor that the toe was draining and she could not have it done.  She is on clindamycin.  So there is some mild discomfort of the toe.  She is remained in the surgical shoe.  Concerned that she may lose her toe. Denies any systemic complaints such as fevers, chills, nausea, vomiting. No acute changes since last appointment, and no other complaints at this time.   Objective: AAO x3, NAD DP/PT pulses palpable bilaterally, CRT less than 3 seconds Right second toe with minimal edema there is no significant erythema there is no ascending cellulitis.  Very minimal purulent drainage is expressed from the wound.  There is no probing there is no undermining or tunneling.  There is mild edema to the toe there is no erythema or warmth.  There is tenderness palpation to the toe. No open lesions or pre-ulcerative lesions.  No pain with calf compression, swelling, warmth, erythema  Assessment: Wound, resolving infection right second toe; MRSA  Plan: -All treatment options discussed with the patient including all alternatives, risks, complications.  -Today I did debride some of the loose tissue along the wound.  There was some peeling skin consistent with the swelling decreasing.  We will switch to iodoform dressing changes daily which I did for her today.  Also we will switch to doxycycline given the drainage.  Recommend hold off on the injection at this time. -Monitor for any clinical signs or symptoms of infection and directed to call the office immediately should any occur or go to the ER.  Trula Slade DPM

## 2018-03-08 ENCOUNTER — Ambulatory Visit: Payer: BC Managed Care – PPO | Admitting: Podiatry

## 2018-03-12 ENCOUNTER — Ambulatory Visit
Admission: RE | Admit: 2018-03-12 | Discharge: 2018-03-12 | Disposition: A | Discharge status: Home / Self Care | Payer: BC Managed Care – PPO | Source: Ambulatory Visit | Attending: Family Medicine | Admitting: Family Medicine

## 2018-03-12 DIAGNOSIS — R1909 Other intra-abdominal and pelvic swelling, mass and lump: Secondary | ICD-10-CM

## 2018-03-12 MED ORDER — IOPAMIDOL (ISOVUE-300) INJECTION 61%
125.0000 mL | Freq: Once | INTRAVENOUS | Status: AC | PRN
  Administered 2018-03-12: 125 mL via INTRAVENOUS

## 2018-03-15 ENCOUNTER — Ambulatory Visit (INDEPENDENT_AMBULATORY_CARE_PROVIDER_SITE_OTHER): Payer: BC Managed Care – PPO | Admitting: Podiatry

## 2018-03-15 ENCOUNTER — Ambulatory Visit (INDEPENDENT_AMBULATORY_CARE_PROVIDER_SITE_OTHER): Payer: BC Managed Care – PPO

## 2018-03-15 DIAGNOSIS — L02611 Cutaneous abscess of right foot: Secondary | ICD-10-CM | POA: Diagnosis not present

## 2018-03-15 DIAGNOSIS — L03031 Cellulitis of right toe: Secondary | ICD-10-CM | POA: Diagnosis not present

## 2018-03-15 DIAGNOSIS — L97511 Non-pressure chronic ulcer of other part of right foot limited to breakdown of skin: Secondary | ICD-10-CM

## 2018-03-18 ENCOUNTER — Telehealth: Payer: Self-pay | Admitting: Podiatry

## 2018-03-18 MED ORDER — DOXYCYCLINE HYCLATE 100 MG PO TABS: 100.0000 mg | ORAL_TABLET | Freq: Two times a day (BID) | ORAL | 0 refills | Status: DC

## 2018-03-18 NOTE — Telephone Encounter (Signed)
Pt was put on antibiotic and has run out, the area seems to be oozing/puss. Patient wants to know if she needs to have the antibiotic refilled and continue taking it. She has a follow up appt. scheduled for 11/15.

## 2018-03-18 NOTE — Addendum Note (Signed)
Addended by: Harriett Sine D on: 03/18/2018 04:59 PM   Modules accepted: Orders

## 2018-03-18 NOTE — Progress Notes (Signed)
Subjective: 57 year old female presents to the office today for follow-up evaluation of right 2nd toe infection.  Overall the toe is been getting better.  She has noticed some darkening on the toe itself but the skin is been peeling some.  She has not had any significant drainage is much improved as well.  She is been wearing the surgical shoe.  Overall she feels well she denies any fevers, chills, nausea, vomiting.  No calf pain, chest pain, shortness of breath. Denies any systemic complaints such as fevers, chills, nausea, vomiting. No acute changes since last appointment, and no other complaints at this time.   Objective: AAO x3, NAD DP/PT pulses palpable bilaterally, CRT less than 3 seconds Right second toe with minimal edema there is no significant erythema there is no ascending cellulitis.  There is some mild darkening of skin just proximal to the nailbed.  This is more from inflammation.  There is immediate capillary refill time of the toe.  There is no erythema increased warmth and unable to identify any drainage or pus today.  The nailbed is starting to scab over.  Not able to probe to bone there is no exposed bone.  No fluctuation or crepitation.  There is no malodor. No open lesions or pre-ulcerative lesions.  No pain with calf compression, swelling, warmth, erythema  Assessment: Wound, resolving infection right second toe; MRSA  Plan: -All treatment options discussed with the patient including all alternatives, risks, complications.  -Today I did debride some of the loose tissue along the wound and the wound is healing. Finish course of antibiotics  -Continue to wear surgical shoe.  -Monitor for any clinical signs or symptoms of infection and directed to call the office immediately should any occur or go to the ER.  Trula Slade DPM

## 2018-03-18 NOTE — Telephone Encounter (Signed)
I called pt and she states the toe was pusing and oozing still at her last appt, and he wanted her to take the antibiotics and she only has a few left now. Pt states it doesn't look or feel any worse, it is peeling but he said it would do that. I told pt is would refill the doxycycline as ordered by Dr. Jacqualyn Posey and let him know her status. I told pt if the symptoms worsened or if she had a fever she should call or go to the ED, otherwise we would see her on 03/22/2018. Pt states understanding.

## 2018-03-22 ENCOUNTER — Encounter: Payer: Self-pay | Admitting: Podiatry

## 2018-03-22 ENCOUNTER — Ambulatory Visit (INDEPENDENT_AMBULATORY_CARE_PROVIDER_SITE_OTHER): Payer: BC Managed Care – PPO | Admitting: Podiatry

## 2018-03-22 VITALS — BP 147/74 | HR 96 | Temp 97.8°F | Resp 18

## 2018-03-22 DIAGNOSIS — L97511 Non-pressure chronic ulcer of other part of right foot limited to breakdown of skin: Secondary | ICD-10-CM

## 2018-03-26 NOTE — Progress Notes (Signed)
Subjective: 57 year old female presents to the office today for follow-up evaluation of right 2nd toe infection.  She states that overall she is doing better and is not been draining.  She denies any redness or swelling or red streaks.  Has some discomfort at times overall she is doing better. Denies any systemic complaints such as fevers, chills, nausea, vomiting. No acute changes since last appointment, and no other complaints at this time.   Objective: AAO x3, NAD DP/PT pulses palpable bilaterally, CRT less than 3 seconds Right second toe with minimal edema there is no significant erythema there is no ascending cellulitis.  Overlying the nailbed is starting to scab over.  There is no probing to bone there is no exposed bone.  There is no drainage or pus identified today.  Minimal swelling to the distal portion of the toe.  There is no ascending cellulitis.  There is no fluctuation or crepitation or any malodor.  No open lesions or pre-ulcerative lesions.  No pain with calf compression, swelling, warmth, erythema  Assessment: Wound, resolving infection right second toe; MRSA  Plan: -All treatment options discussed with the patient including all alternatives, risks, complications.  -Today I did debride some of the loose tissue along the wound and the wound is healing.  Overall the toe is doing better.  I do want to recheck blood work this was ordered today. -Continue to wear surgical shoe.  -Monitor for any clinical signs or symptoms of infection and directed to call the office immediately should any occur or go to the ER.  Trula Slade DPM

## 2018-03-29 ENCOUNTER — Ambulatory Visit (INDEPENDENT_AMBULATORY_CARE_PROVIDER_SITE_OTHER): Payer: BC Managed Care – PPO | Admitting: Podiatry

## 2018-03-29 ENCOUNTER — Ambulatory Visit (INDEPENDENT_AMBULATORY_CARE_PROVIDER_SITE_OTHER): Payer: BC Managed Care – PPO

## 2018-03-29 ENCOUNTER — Encounter: Payer: Self-pay | Admitting: Podiatry

## 2018-03-29 DIAGNOSIS — L97511 Non-pressure chronic ulcer of other part of right foot limited to breakdown of skin: Secondary | ICD-10-CM

## 2018-03-29 DIAGNOSIS — Z22322 Carrier or suspected carrier of Methicillin resistant Staphylococcus aureus: Secondary | ICD-10-CM | POA: Diagnosis not present

## 2018-03-29 NOTE — Progress Notes (Signed)
Subjective: 57 year old female presents to the office today for follow-up evaluation of right 2nd toe infection.  She states that overall she is doing great.  She is no longer even putting the bandage on the area because she feels area is healed and there is been no drainage or pus coming from the area.  Some minimal swelling and some occasional discomfort at nighttime but overall she is been doing well.   She also states that we called her yesterday about her blood work she was thinking about it.  She also had an abscess under her arm and she states that she had pus.  She states that she has been evaluated for this previously.  She states this was ongoing when she had the blood work done as well.   Denies any systemic complaints such as fevers, chills, nausea, vomiting. No acute changes since last appointment, and no other complaints at this time.   Objective: AAO x3, NAD DP/PT pulses palpable bilaterally, CRT less than 3 seconds Right second toe has a scab on the nailbed.  There is no drainage or pus there is no fluctuation or crepitation.  Minimal swelling to the toe but there is no erythema there is no increase in warmth.  Some residual hyperpigmentation of the toe but this is likely more from inflammation.  No ascending sialitis.  No other open lesions identified to the bilateral lower extremities. No open lesions or pre-ulcerative lesions.  No pain with calf compression, swelling, warmth, erythema  Assessment: Wound, resolving infection right second toe; MRSA  Plan: -All treatment options discussed with the patient including all alternatives, risks, complications.  -X-rays were obtained reviewed.  No definitive cortical changes to suggest osteomyelitis or soft tissue emphysema. -Overall the toe is doing much better.  There is no significant tissue to debride today.  Continue antibiotic ointment dressing changes daily.  For now continue the surgical shoe as she is progressing she can return  to regular shoe.  I do want to recheck blood work next week.  I gave her order for ESR, CRP, CBC.  Trula Slade DPM

## 2018-04-16 ENCOUNTER — Ambulatory Visit (INDEPENDENT_AMBULATORY_CARE_PROVIDER_SITE_OTHER): Payer: BC Managed Care – PPO

## 2018-04-16 ENCOUNTER — Encounter: Payer: Self-pay | Admitting: Podiatry

## 2018-04-16 ENCOUNTER — Ambulatory Visit (INDEPENDENT_AMBULATORY_CARE_PROVIDER_SITE_OTHER): Payer: BC Managed Care – PPO | Admitting: Podiatry

## 2018-04-16 VITALS — BP 138/77 | HR 101 | Resp 16

## 2018-04-16 DIAGNOSIS — L97511 Non-pressure chronic ulcer of other part of right foot limited to breakdown of skin: Secondary | ICD-10-CM

## 2018-04-17 DIAGNOSIS — M1710 Unilateral primary osteoarthritis, unspecified knee: Secondary | ICD-10-CM | POA: Insufficient documentation

## 2018-04-17 DIAGNOSIS — M179 Osteoarthritis of knee, unspecified: Secondary | ICD-10-CM | POA: Insufficient documentation

## 2018-04-17 NOTE — Progress Notes (Signed)
Subjective: 57 year old female presents to the office today for follow-up evaluation of right 2nd toe infection.  She states that she is remained in the surgical shoe only because there was some dry skin around the edges and she did not want to put any pressure on it.  She states that she has been feeling well and she is had no pain and she has not had any drainage.  She states that overall has been doing well.   There is an abscess on her right arm she believes is improving.  She has not yet seen her primary care physician for this.  Denies any systemic complaints such as fevers, chills, nausea, vomiting. No acute changes since last appointment, and no other complaints at this time.   Objective: AAO x3, NAD DP/PT pulses palpable bilaterally, CRT less than 3 seconds Right second toe has a callus on the nailbed.  I was able to debride this and upon debridement there is no underlying ulceration, drainage or any signs of infection.  There is no fluctuation or crepitation.  Minimal swelling to the toe but overall is much improved.  There is no redness or warmth of the toe or the foot.  No ascending cellulitis.  No open lesions or pre-ulcerative lesions.  No pain with calf compression, swelling, warmth, erythema  Assessment: Wound, resolving infection right second toe; MRSA  Plan: -All treatment options discussed with the patient including all alternatives, risks, complications.  -X-rays were obtained reviewed.  No definitive cortical changes to suggest osteomyelitis or soft tissue emphysema. -I did her blood work.  Her white blood cell count, ESR, CRP are still elevated.  Clinically the toe is doing very well does not appear to be infected. -Continue surgical shoe for now but as she is progressing and feeling better she can start to go back into regular shoe.  Dispensed offloading pads for her. -Monitor for any clinical signs or symptoms of infection and directed to call the office immediately  should any occur or go to the ER.  RTC 2 weeks or sooner if needed  Natasha Rollins DPM

## 2018-04-23 ENCOUNTER — Telehealth: Payer: Self-pay | Admitting: *Deleted

## 2018-04-23 NOTE — Telephone Encounter (Signed)
Called patient on 04/16/18 per Dr Jacqualyn Posey and stated that I was calling to check on patient and patient stated that she went to her PCP Dr Jonathon Jordan at [facility] and that patient was doing better and there was a little nausea and that the draining had stopped and patient stated that she kindly mentioned it to her doctor and was not given any antibiotics and had been dizzy and I stated to call the office if any concerns or questions at [phone]. Lattie Haw

## 2018-05-07 ENCOUNTER — Encounter: Payer: BC Managed Care – PPO | Admitting: Podiatry

## 2018-05-14 ENCOUNTER — Encounter: Payer: BC Managed Care – PPO | Admitting: Podiatry

## 2018-05-16 ENCOUNTER — Encounter: Payer: BC Managed Care – PPO | Admitting: Podiatry

## 2018-05-28 ENCOUNTER — Encounter: Payer: Self-pay | Admitting: Infectious Disease

## 2018-05-28 ENCOUNTER — Ambulatory Visit
Admission: RE | Admit: 2018-05-28 | Discharge: 2018-05-28 | Disposition: A | Discharge status: Home / Self Care | Payer: BC Managed Care – PPO | Source: Ambulatory Visit | Attending: Infectious Disease | Admitting: Infectious Disease

## 2018-05-28 ENCOUNTER — Ambulatory Visit (INDEPENDENT_AMBULATORY_CARE_PROVIDER_SITE_OTHER): Payer: BC Managed Care – PPO | Admitting: Infectious Disease

## 2018-05-28 VITALS — Wt 373.0 lb

## 2018-05-28 DIAGNOSIS — M7989 Other specified soft tissue disorders: Secondary | ICD-10-CM

## 2018-05-28 DIAGNOSIS — M79671 Pain in right foot: Secondary | ICD-10-CM | POA: Diagnosis not present

## 2018-05-28 DIAGNOSIS — D72829 Elevated white blood cell count, unspecified: Secondary | ICD-10-CM

## 2018-05-28 DIAGNOSIS — L97519 Non-pressure chronic ulcer of other part of right foot with unspecified severity: Secondary | ICD-10-CM

## 2018-05-28 DIAGNOSIS — L732 Hidradenitis suppurativa: Secondary | ICD-10-CM | POA: Diagnosis not present

## 2018-05-28 HISTORY — DX: Other specified soft tissue disorders: M79.89

## 2018-05-28 HISTORY — DX: Hidradenitis suppurativa: L73.2

## 2018-05-28 HISTORY — DX: Non-pressure chronic ulcer of other part of right foot with unspecified severity: L97.519

## 2018-05-28 NOTE — Progress Notes (Signed)
Subjective:   Reason for consultation: Leukocytosis  Requesting physician: Dr. Jonathon Jordan   Patient ID: Natasha Rollins, female    DOB: 13-Jul-1960, 58 y.o.   MRN: [id number]  HPI  Natasha Rollins is a 58 year old African-American lady with a past medical history significant for morbid obesity, hidradenitis suppurativa, ovarian cyst, deep venous thrombosis, diabetic foot ulcer who has had persistent elevation in her white count and inflammatory markers.  Of note she has had an ulcer in her second right toe which was followed closely by Dr. Earleen Newport with podiatry.  She never had evidence of osteomyelitis by plain films.  Apparently MRSA was isolated from culture in that toe and again she was followed closely.  When Dr. Carman Ching saw her last on 10 December the wound solving.  Again x-rays did not show evidence of osteomyelitis at that time though her inflammatory markers were still elevated.  She has had persistently elevated white blood cell count with primary care doctor as well.  She has occasionally had flares of infection in her axilla bilaterally due to her underlying hidradenitis.  She also has several lumps in her pannus.  She tells me that these initially corresponded to areas where she had to inject Lovenox for 21 actions and she developed 21 different spots there.  She also has been injecting insulin   CT of the abdomen and pelvis was performed in November that suggested fat necrosis in multiple areas where these injections had been performed  She is currently without fevers weight loss drenching night sweats nausea vomiting or other symptoms to suggest severe systemic infection.  Her toe is not painful or tender to palpation.  Says that 1 of the areas in her abdomen is seeming to grow in size.  On exam she does have a protuberant pannus and has multiple nodules under the skin although these do not clearly abscesses by my exam or by the imaging performed in November.  Past Medical  History:  Diagnosis Date  . Diabetes mellitus without complication ([place])   . Fat necrosis 05/28/2018  . Hidradenitis suppurativa 05/28/2018  . Hypertension   . Right second toe ulcer ([place]) 05/28/2018    Past Surgical History:  Procedure Laterality Date  . CHOLECYSTECTOMY    . HERNIA REPAIR    . TONSILLECTOMY      No family history on file.    Social History   Socioeconomic History  . Marital status: Married    Spouse name: Not on file  . Number of children: Not on file  . Years of education: Not on file  . Highest education level: Not on file  Occupational History  . Not on file  Social Needs  . Financial resource strain: Not on file  . Food insecurity:    Worry: Not on file    Inability: Not on file  . Transportation needs:    Medical: Not on file    Non-medical: Not on file  Tobacco Use  . Smoking status: Never Smoker  . Smokeless tobacco: Never Used  Substance and Sexual Activity  . Alcohol use: No  . Drug use: No  . Sexual activity: Yes  Lifestyle  . Physical activity:    Days per week: Not on file    Minutes per session: Not on file  . Stress: Not on file  Relationships  . Social connections:    Talks on phone: Not on file    Gets together: Not on file    Attends religious service: Not on  file    Active member of club or organization: Not on file    Attends meetings of clubs or organizations: Not on file    Relationship status: Not on file  Other Topics Concern  . Not on file  Social History Narrative  . Not on file    Allergies  Allergen Reactions  . Penicillins Itching and Rash     Current Outpatient Medications:  .  acetaminophen (TYLENOL) 325 MG tablet, Take 2 tablets (650 mg total) by mouth every 6 (six) hours as needed for mild pain, moderate pain or headache (or Fever >/= 101)., Disp: , Rfl:  .  amLODipine (NORVASC) 10 MG tablet, Take 10 mg by mouth daily., Disp: , Rfl: 8 .  atorvastatin (LIPITOR) 10 MG tablet, TAKE 1 TABLET BY MOUTH 3  DAYS A WEEK, Disp: , Rfl: 0 .  doxycycline (VIBRA-TABS) 100 MG tablet, Take 1 tablet (100 mg total) by mouth 2 (two) times daily., Disp: 20 tablet, Rfl: 0 .  folic acid (FOLVITE) 1 MG tablet, Take 1 mg by mouth daily., Disp: , Rfl:  .  HYDROcodone-acetaminophen (NORCO/VICODIN) 5-325 MG tablet, hydrocodone 5 mg-acetaminophen 325 mg tablet  Take 1 tablet every 6 hours by oral route., Disp: , Rfl:  .  JANUVIA 100 MG tablet, Take 100 mg by mouth daily., Disp: , Rfl: 4 .  LEVEMIR FLEXTOUCH 100 UNIT/ML Pen, Inject 75 Units into the skin 2 (two) times daily. , Disp: , Rfl:  .  losartan-hydrochlorothiazide (HYZAAR) 100-25 MG tablet, Take 1 tablet by mouth daily., Disp: , Rfl: 4 .  metroNIDAZOLE (METROGEL) 1 % gel, APPLY TO AFFECTED AREA EVERY DAY, Disp: , Rfl: 1 .  mupirocin ointment (BACTROBAN) 2 %, Apply 1 application topically 2 (two) times daily., Disp: 30 g, Rfl: 2 .  NOVOLOG FLEXPEN 100 UNIT/ML FlexPen, Inject 30 Units into the skin 3 (three) times daily with meals. , Disp: , Rfl:  .  TRADJENTA 5 MG TABS tablet, Take 5 mg by mouth daily. , Disp: , Rfl: 5 .  valsartan-hydrochlorothiazide (DIOVAN-HCT) 320-25 MG tablet, Take 1 tablet by mouth daily., Disp: , Rfl:  .  warfarin (COUMADIN) 5 MG tablet, Take 1 tablet (5 mg total) by mouth daily at 6 PM., Disp: 30 tablet, Rfl: 0      Review of Systems  Constitutional: Negative for activity change, appetite change, chills, diaphoresis, fatigue, fever and unexpected weight change.  HENT: Negative for congestion, rhinorrhea, sinus pressure, sneezing, sore throat and trouble swallowing.   Eyes: Negative for photophobia and visual disturbance.  Respiratory: Negative for cough, chest tightness, shortness of breath, wheezing and stridor.   Cardiovascular: Negative for chest pain, palpitations and leg swelling.  Gastrointestinal: Positive for abdominal pain. Negative for abdominal distention, anal bleeding, blood in stool, constipation, diarrhea, nausea and  vomiting.  Genitourinary: Negative for difficulty urinating, dysuria, flank pain and hematuria.  Musculoskeletal: Negative for arthralgias, back pain, gait problem, joint swelling and myalgias.  Skin: Positive for color change and wound. Negative for pallor and rash.  Neurological: Negative for dizziness, tremors, weakness and light-headedness.  Hematological: Negative for adenopathy. Does not bruise/bleed easily.  Psychiatric/Behavioral: Negative for agitation, behavioral problems, confusion, decreased concentration, dysphoric mood and sleep disturbance.       Objective:   Physical Exam Constitutional:      General: She is not in acute distress.    Appearance: Normal appearance. She is well-developed. She is not ill-appearing or diaphoretic.  HENT:     Head: Normocephalic and  atraumatic.     Right Ear: Hearing and external ear normal.     Left Ear: Hearing and external ear normal.     Nose: No nasal deformity or rhinorrhea.  Eyes:     General: No scleral icterus.    Conjunctiva/sclera: Conjunctivae normal.     Right eye: Right conjunctiva is not injected.     Left eye: Left conjunctiva is not injected.  Neck:     Musculoskeletal: Normal range of motion and neck supple.     Vascular: No JVD.  Cardiovascular:     Rate and Rhythm: Normal rate and regular rhythm.     Heart sounds: S1 normal and S2 normal.  Pulmonary:     Effort: Pulmonary effort is normal. No respiratory distress.     Breath sounds: No wheezing.  Abdominal:     General: Bowel sounds are normal. There is no distension.     Palpations: Abdomen is soft.     Tenderness: There is no abdominal tenderness.  Musculoskeletal: Normal range of motion.     Right shoulder: Normal.     Left shoulder: Normal.     Right hip: Normal.     Left hip: Normal.     Right knee: Normal.     Left knee: Normal.  Lymphadenopathy:     Head:     Right side of head: No submandibular, preauricular or posterior auricular adenopathy.      Left side of head: No submandibular, preauricular or posterior auricular adenopathy.     Cervical: No cervical adenopathy.     Right cervical: No superficial or deep cervical adenopathy.    Left cervical: No superficial or deep cervical adenopathy.  Skin:    General: Skin is warm and dry.     Coloration: Skin is not pale.     Findings: Erythema present. No abrasion, bruising, ecchymosis, lesion or rash.     Nails: There is no clubbing.   Neurological:     Mental Status: She is alert and oriented to person, place, and time.     Sensory: No sensory deficit.     Coordination: Coordination normal.     Gait: Gait normal.  Psychiatric:        Attention and Perception: She is attentive.        Mood and Affect: Mood normal.        Speech: Speech normal.        Behavior: Behavior normal. Behavior is cooperative.        Thought Content: Thought content normal.        Judgment: Judgment normal.     Right toe with increased pigmentation versus the other adjacent one see picture below May 28, 2018:      Pannus with several areas of induration see picture May 28, 2018:          Assessment & Plan:   Leukocytosis: There are multiple potential sources.  Have some anxiety about the foot given the elevation in the inflammatory markers we will get plain films of the foot today we will also repeat her inflammatory markers her CBC and a comprehensive metabolic panel  If plain film is unremarkable we will proceed with an MRI of the foot as well  Not anxious to repeat CAT scan of the abdomen at this point given the fact that these areas were thought to be fat necrosis before but rather prefer to observe them clinically.  I like her to come back in 1 month's time.  Screening for STIs: She should have an HIV test and I forgot to order at this time but I will order it the next time I see her

## 2018-05-29 LAB — COMPLETE METABOLIC PANEL WITH GFR
AG Ratio: 1 (calc) (ref 1.0–2.5)
ALKALINE PHOSPHATASE (APISO): 70 U/L (ref 33–130)
ALT: 12 U/L (ref 6–29)
AST: 13 U/L (ref 10–35)
Albumin: 3.7 g/dL (ref 3.6–5.1)
BUN: 18 mg/dL (ref 7–25)
CALCIUM: 9.5 mg/dL (ref 8.6–10.4)
CO2: 30 mmol/L (ref 20–32)
CREATININE: 0.86 mg/dL (ref 0.50–1.05)
Chloride: 103 mmol/L (ref 98–110)
GFR, EST NON AFRICAN AMERICAN: 74 mL/min/{1.73_m2} (ref >=60)
GFR, Est African American: 86 mL/min/{1.73_m2} (ref >=60)
GLOBULIN: 3.7 g/dL (ref 1.9–3.7)
Glucose, Bld: 81 mg/dL (ref 65–99)
Potassium: 4.3 mmol/L (ref 3.5–5.3)
SODIUM: 141 mmol/L (ref 135–146)
Total Bilirubin: 0.3 mg/dL (ref 0.2–1.2)
Total Protein: 7.4 g/dL (ref 6.1–8.1)

## 2018-05-29 LAB — CBC WITH DIFFERENTIAL/PLATELET
ABSOLUTE MONOCYTES: 737 {cells}/uL (ref 200–950)
BASOS ABS: 59 {cells}/uL (ref 0–200)
Basophils Relative: 0.5 %
EOS ABS: 140 {cells}/uL (ref 15–500)
Eosinophils Relative: 1.2 %
HEMATOCRIT: 34.6 % — AB (ref 35.0–45.0)
HEMOGLOBIN: 11.4 g/dL — AB (ref 11.7–15.5)
LYMPHS ABS: 2972 {cells}/uL (ref 850–3900)
MCH: 26 pg — AB (ref 27.0–33.0)
MCHC: 32.9 g/dL (ref 32.0–36.0)
MCV: 78.8 fL — AB (ref 80.0–100.0)
MPV: 9.4 fL (ref 7.5–12.5)
Monocytes Relative: 6.3 %
NEUTROS ABS: 7792 {cells}/uL (ref 1500–7800)
Neutrophils Relative %: 66.6 %
Platelets: 461 10*3/uL — ABNORMAL HIGH (ref 140–400)
RBC: 4.39 10*6/uL (ref 3.80–5.10)
RDW: 14.9 % (ref 11.0–15.0)
Total Lymphocyte: 25.4 %
WBC: 11.7 10*3/uL — AB (ref 3.8–10.8)

## 2018-05-29 LAB — SEDIMENTATION RATE: Sed Rate: 63 mm/h — ABNORMAL HIGH (ref 0–30)

## 2018-05-29 LAB — C-REACTIVE PROTEIN: CRP: 14.4 mg/L — AB (ref <8.0)

## 2018-06-12 ENCOUNTER — Ambulatory Visit
Admission: RE | Admit: 2018-06-12 | Discharge: 2018-06-12 | Disposition: A | Discharge status: Home / Self Care | Payer: BC Managed Care – PPO | Source: Ambulatory Visit | Attending: Infectious Disease | Admitting: Infectious Disease

## 2018-06-12 DIAGNOSIS — L97519 Non-pressure chronic ulcer of other part of right foot with unspecified severity: Secondary | ICD-10-CM

## 2018-06-12 MED ORDER — GADOBENATE DIMEGLUMINE 529 MG/ML IV SOLN
20.0000 mL | Freq: Once | INTRAVENOUS | Status: AC | PRN
  Administered 2018-06-12: 20 mL via INTRAVENOUS

## 2018-06-28 ENCOUNTER — Ambulatory Visit: Payer: BC Managed Care – PPO | Admitting: Infectious Diseases

## 2018-07-18 ENCOUNTER — Encounter: Payer: Self-pay | Admitting: Infectious Diseases

## 2018-07-18 ENCOUNTER — Ambulatory Visit: Payer: BC Managed Care – PPO | Admitting: Infectious Diseases

## 2018-07-18 ENCOUNTER — Other Ambulatory Visit: Payer: Self-pay

## 2018-07-18 DIAGNOSIS — L732 Hidradenitis suppurativa: Secondary | ICD-10-CM

## 2018-07-18 DIAGNOSIS — D72829 Elevated white blood cell count, unspecified: Secondary | ICD-10-CM | POA: Diagnosis not present

## 2018-07-18 DIAGNOSIS — L603 Nail dystrophy: Secondary | ICD-10-CM

## 2018-07-18 DIAGNOSIS — L97519 Non-pressure chronic ulcer of other part of right foot with unspecified severity: Secondary | ICD-10-CM | POA: Diagnosis not present

## 2018-07-18 NOTE — Progress Notes (Signed)
Subjective:   Reason for consultation: Leukocytosis   Requesting physician: Dr. Jonathon Jordan   Patient ID: Natasha Rollins, female    DOB: 02-29-1961, 58 y.o.   MRN: [id number]  Natasha Rollins is a 58 year old African-American woman with a past medical history significant for morbid obesity, hidradenitis suppurativa (axilla), ovarian cyst, deep venous thrombosis, diabetic foot ulcer R #2 toe who has had persistent elevation in her white count and inflammatory markers. She follows with Dr. Jacqualyn Posey, podiatrist for the ulcer on the dorsal #2 toe. At one point MRSA was cultured from this site and she has been followed closely with resolution of ulcer in December. X rays have been assessed a few times and unrevealing for any osteomyelitis however her inflammatory markers remain persistently elevated with on-going leukocytosis. She was referred to ID for work up/eval of this and saw Dr. Tommy Medal about 6 weeks ago. MRI of the foot was ordered at that time and negative for any septic arthritis, osteomyelitis. Her ulcer has resolved and there is no longer any open areas. She has intermittent flares of infection in her axilla b/l due to underlying hidradenitis suppurativa and several lumps in panus that she states were due to lovenox injections in the past. CT of the abdomen and pelvis was performed in November that suggested fat necrosis in multiple areas where these injections had been performed.  Since her last visit with Dr. Tommy Medal she has felt well. Denies any fevers, chills, night sweats, new wounds, upper or lower respiratory symptoms, abd pain, diarrhea, dysuria. She does have some ongoing pain to the right toe where previous ulcer and some darker discoloration that has improved with time. She is now well maintained on her warfarin (h/o blood clot to the right leg) and no longer doing injections of lovenox. She does continue to inject insulin into abdomen. She tells me her wbc counts have normalized from  blood work done by PCP.    Past Medical History:  Diagnosis Date  . Diabetes mellitus without complication ([place])   . Fat necrosis 05/28/2018  . Hidradenitis suppurativa 05/28/2018  . Hypertension   . Right second toe ulcer ([place]) 05/28/2018    Past Surgical History:  Procedure Laterality Date  . CHOLECYSTECTOMY    . HERNIA REPAIR    . TONSILLECTOMY       Social History   Socioeconomic History  . Marital status: Married    Spouse name: Not on file  . Number of children: Not on file  . Years of education: Not on file  . Highest education level: Not on file  Occupational History  . Not on file  Social Needs  . Financial resource strain: Not on file  . Food insecurity:    Worry: Not on file    Inability: Not on file  . Transportation needs:    Medical: Not on file    Non-medical: Not on file  Tobacco Use  . Smoking status: Never Smoker  . Smokeless tobacco: Never Used  Substance and Sexual Activity  . Alcohol use: No  . Drug use: No  . Sexual activity: Yes  Lifestyle  . Physical activity:    Days per week: Not on file    Minutes per session: Not on file  . Stress: Not on file  Relationships  . Social connections:    Talks on phone: Not on file    Gets together: Not on file    Attends religious service: Not on file    Active member  of club or organization: Not on file    Attends meetings of clubs or organizations: Not on file    Relationship status: Not on file  Other Topics Concern  . Not on file  Social History Narrative  . Not on file    Allergies  Allergen Reactions  . Penicillins Itching and Rash     Current Outpatient Medications:  .  acetaminophen (TYLENOL) 325 MG tablet, Take 2 tablets (650 mg total) by mouth every 6 (six) hours as needed for mild pain, moderate pain or headache (or Fever >/= 101)., Disp: , Rfl:  .  amLODipine (NORVASC) 10 MG tablet, Take 10 mg by mouth daily., Disp: , Rfl: 8 .  atorvastatin (LIPITOR) 10 MG tablet, TAKE 1 TABLET  BY MOUTH 3 DAYS A WEEK, Disp: , Rfl: 0 .  folic acid (FOLVITE) 1 MG tablet, Take 1 mg by mouth daily., Disp: , Rfl:  .  HYDROcodone-acetaminophen (NORCO/VICODIN) 5-325 MG tablet, hydrocodone 5 mg-acetaminophen 325 mg tablet  Take 1 tablet every 6 hours by oral route., Disp: , Rfl:  .  JANUVIA 100 MG tablet, Take 100 mg by mouth daily., Disp: , Rfl: 4 .  LEVEMIR FLEXTOUCH 100 UNIT/ML Pen, Inject 75 Units into the skin 2 (two) times daily. , Disp: , Rfl:  .  losartan-hydrochlorothiazide (HYZAAR) 100-25 MG tablet, Take 1 tablet by mouth daily., Disp: , Rfl: 4 .  metroNIDAZOLE (METROGEL) 1 % gel, APPLY TO AFFECTED AREA EVERY DAY, Disp: , Rfl: 1 .  mupirocin ointment (BACTROBAN) 2 %, Apply 1 application topically 2 (two) times daily., Disp: 30 g, Rfl: 2 .  NOVOLOG FLEXPEN 100 UNIT/ML FlexPen, Inject 30 Units into the skin 3 (three) times daily with meals. , Disp: , Rfl:  .  TRADJENTA 5 MG TABS tablet, Take 5 mg by mouth daily. , Disp: , Rfl: 5 .  valsartan-hydrochlorothiazide (DIOVAN-HCT) 320-25 MG tablet, Take 1 tablet by mouth daily., Disp: , Rfl:  .  doxycycline (VIBRA-TABS) 100 MG tablet, Take 1 tablet (100 mg total) by mouth 2 (two) times daily. (Patient not taking: Reported on 07/18/2018), Disp: 20 tablet, Rfl: 0 .  warfarin (COUMADIN) 5 MG tablet, Take 1 tablet (5 mg total) by mouth daily at 6 PM., Disp: 30 tablet, Rfl: 0      Review of Systems  Constitutional: Negative for activity change, appetite change, chills, diaphoresis, fatigue, fever and unexpected weight change.  HENT: Negative for congestion, rhinorrhea, sinus pressure, sneezing, sore throat and trouble swallowing.   Eyes: Negative for photophobia and visual disturbance.  Respiratory: Negative for cough, chest tightness, shortness of breath, wheezing and stridor.   Cardiovascular: Negative for chest pain, palpitations and leg swelling.  Gastrointestinal: Positive for abdominal pain (superficial overlying nodules). Negative for  abdominal distention, anal bleeding, blood in stool, constipation, diarrhea, nausea and vomiting.  Genitourinary: Negative for difficulty urinating, dysuria, flank pain and hematuria.  Musculoskeletal: Negative for arthralgias, back pain, gait problem, joint swelling and myalgias.  Skin: Positive for color change. Negative for pallor, rash and wound.  Neurological: Negative for dizziness, tremors, weakness and light-headedness.  Hematological: Negative for adenopathy. Does not bruise/bleed easily.  Psychiatric/Behavioral: Negative for agitation, behavioral problems, confusion, decreased concentration, dysphoric mood and sleep disturbance.       Objective:   Physical Exam Constitutional:      General: She is not in acute distress.    Appearance: Normal appearance. She is well-developed. She is obese. She is not ill-appearing or diaphoretic.  HENT:  Head: Normocephalic and atraumatic.     Right Ear: Hearing and external ear normal.     Left Ear: Hearing and external ear normal.     Nose: No nasal deformity or rhinorrhea.  Eyes:     General: No scleral icterus.    Conjunctiva/sclera: Conjunctivae normal.     Right eye: Right conjunctiva is not injected.     Left eye: Left conjunctiva is not injected.  Neck:     Musculoskeletal: Normal range of motion and neck supple.     Vascular: No JVD.  Cardiovascular:     Rate and Rhythm: Normal rate and regular rhythm.     Heart sounds: S1 normal and S2 normal.  Pulmonary:     Effort: Pulmonary effort is normal. No respiratory distress.     Breath sounds: No wheezing.  Abdominal:     General: Bowel sounds are normal. There is no distension.     Palpations: Abdomen is soft.     Tenderness: There is no abdominal tenderness.  Musculoskeletal: Normal range of motion.     Right shoulder: Normal.     Left shoulder: Normal.     Right hip: Normal.     Left hip: Normal.     Right knee: Normal.     Left knee: Normal.  Lymphadenopathy:      Head:     Right side of head: No submandibular, preauricular or posterior auricular adenopathy.     Left side of head: No submandibular, preauricular or posterior auricular adenopathy.     Cervical: No cervical adenopathy.     Right cervical: No superficial or deep cervical adenopathy.    Left cervical: No superficial or deep cervical adenopathy.  Skin:    General: Skin is warm and dry.     Coloration: Skin is not pale.     Findings: No abrasion, bruising, ecchymosis, erythema, lesion or rash.     Nails: There is no clubbing.      Comments: Slight darkening to the right #2 toe. No open ulceration. No drainage. Toe nails are brittle and multiple areas are broken. She has spooning of the fingernails.   Neurological:     Mental Status: She is alert and oriented to person, place, and time.     Sensory: No sensory deficit.     Coordination: Coordination normal.     Gait: Gait normal.  Psychiatric:        Attention and Perception: She is attentive.        Mood and Affect: Mood normal.        Speech: Speech normal.        Behavior: Behavior normal. Behavior is cooperative.        Thought Content: Thought content normal.        Judgment: Judgment normal.       Assessment & Plan:   Problem List Items Addressed This Visit      Unprioritized   Leukocytosis    She has had intermittent leukocytosis dating back to 2018 with periods of normalization. No elevated neutrophils or shift on diff. MRI of the right foot w/o signs of infection. She does carry with her a dx of hidradenitis suppurativa which is a chronic inflammatory condition that waxes and wanes; I suspect this may be contributing to both elevated WBC and inflammatory markers to some degree. Otherwise she is well, non-toxic and hemodynamically stable.  No further work up at this time.       Right second toe ulcer (  [place])    Resolved but still with some pain and darkened tissue. ?ischemic injury with h/o clots. She reports INRs w/in  range on warfarin so maybe less likely. No records of INRs in system to verify.       Hidradenitis suppurativa    Currently under control - likely explains her now normal labs done at outside facility per her report. May benefit from dermatology referral for consideration of biologic agent to help.       Spoon shaped nails    I mentioned to her that she may have some iron deficiency with this finding - she tells me that she is supposed to be on iron but does not take it but will resume it and follow up with PCP. She has a h/o heavy menses now post menopausal.         She can follow up with Dr. Tommy Medal as needed for now.   Natasha Madeira, MSN, NP-C [facility] [email or web address] Pager: [phone] Office: [phone]

## 2018-07-18 NOTE — Patient Instructions (Addendum)
There is no infection in your toe from your MRI. I am pleased with how this looks as well compared to the pictures.   I wonder if you had a small blood clot to your toe which could cause it to have pain and discoloration.   You can follow up as needed with Dr. Tommy Medal for now - Please take good care of your feet!  Iron Deficiency Anemia, Adult Iron-deficiency anemia is when you have a low amount of red blood cells or hemoglobin. This happens because you have too little iron in your body. Hemoglobin carries oxygen to parts of the body. Anemia can cause your body to not get enough oxygen. It may or may not cause symptoms. Follow these instructions at home: Medicines  Take over-the-counter and prescription medicines only as told by your doctor. This includes iron pills (supplements) and vitamins.  If you cannot handle taking iron pills by mouth, ask your doctor about getting iron through: ? A vein (intravenously). ? A shot (injection) into a muscle.  Take iron pills when your stomach is empty. If you cannot handle this, take them with food.  Do not drink milk or take antacids at the same time as your iron pills.  To prevent trouble pooping (constipation), eat fiber or take medicine (stool softener) as told by your doctor.  Eating and drinking  Talk with your doctor before changing the foods you eat. He or she may tell you to eat foods that have a lot of iron, such as: ? Liver. ? Lowfat (lean) beef. ? Breads and cereals that have iron added to them (fortified breads and cereals). ? Eggs. ? Dried fruit. ? Dark green, leafy vegetables. (CAREFUL WITH COUMADIN)  Drink enough fluid to keep your pee (urine) clear or pale yellow.  Eat fresh fruits and vegetables that are high in vitamin C. They help your body to use iron. Foods with a lot of vitamin C include: ? Oranges. ? Peppers. ? Tomatoes. ? Mangoes. General instructions  Return to your normal activities as told by your doctor. Ask  your doctor what activities are safe for you.  Keep yourself clean, and keep things clean around you (your surroundings). Anemia can make you get sick more easily.  Keep all follow-up visits as told by your doctor. This is important. Contact a doctor if:  You feel sick to your stomach (nauseous).  You throw up (vomit).  You feel weak.  You are sweating for no clear reason.  You have trouble pooping, such as: ? Pooping (having a bowel movement) less than 3 times a week. ? Straining to poop. ? Having poop that is hard, dry, or larger than normal. ? Feeling full or bloated. ? Pain in the lower belly. ? Not feeling better after pooping. Get help right away if:  You pass out (faint). If this happens, do not drive yourself to the hospital. Call your local emergency services (911 in the [place]  You have chest pain.  You have shortness of breath that: ? Is very bad. ? Gets worse with physical activity.  You have a fast heartbeat.  You get light-headed when getting up from sitting or lying down. This information is not intended to replace advice given to you by your health care provider. Make sure you discuss any questions you have with your health care provider. Document Released: 05/27/2010 Document Revised: 01/12/2016 Document Reviewed: 01/12/2016 Elsevier Interactive Patient Education  2019 [employer].

## 2018-07-20 DIAGNOSIS — L603 Nail dystrophy: Secondary | ICD-10-CM | POA: Insufficient documentation

## 2018-07-20 NOTE — Assessment & Plan Note (Signed)
She has had intermittent leukocytosis dating back to 2018 with periods of normalization. No elevated neutrophils or shift on diff. MRI of the right foot w/o signs of infection. She does carry with her a dx of hidradenitis suppurativa which is a chronic inflammatory condition that waxes and wanes; I suspect this may be contributing to both elevated WBC and inflammatory markers to some degree. Otherwise she is well, non-toxic and hemodynamically stable.  No further work up at this time.

## 2018-07-20 NOTE — Assessment & Plan Note (Signed)
Currently under control - likely explains her now normal labs done at outside facility per her report. May benefit from dermatology referral for consideration of biologic agent to help.

## 2018-07-20 NOTE — Assessment & Plan Note (Signed)
I mentioned to her that she may have some iron deficiency with this finding - she tells me that she is supposed to be on iron but does not take it but will resume it and follow up with PCP. She has a h/o heavy menses now post menopausal.

## 2018-07-20 NOTE — Assessment & Plan Note (Signed)
Resolved but still with some pain and darkened tissue. ?ischemic injury with h/o clots. She reports INRs w/in range on warfarin so maybe less likely. No records of INRs in system to verify.

## 2018-08-05 ENCOUNTER — Ambulatory Visit: Payer: BC Managed Care – PPO | Admitting: Infectious Diseases

## 2018-08-29 ENCOUNTER — Encounter: Payer: Self-pay | Admitting: Podiatry

## 2018-08-29 ENCOUNTER — Ambulatory Visit: Payer: BC Managed Care – PPO | Admitting: Podiatry

## 2018-08-29 ENCOUNTER — Ambulatory Visit (INDEPENDENT_AMBULATORY_CARE_PROVIDER_SITE_OTHER): Payer: BC Managed Care – PPO

## 2018-08-29 ENCOUNTER — Other Ambulatory Visit: Payer: Self-pay

## 2018-08-29 VITALS — Temp 97.9°F

## 2018-08-29 DIAGNOSIS — M79671 Pain in right foot: Secondary | ICD-10-CM | POA: Diagnosis not present

## 2018-08-29 DIAGNOSIS — M7751 Other enthesopathy of right foot: Secondary | ICD-10-CM | POA: Diagnosis not present

## 2018-08-29 DIAGNOSIS — M779 Enthesopathy, unspecified: Secondary | ICD-10-CM

## 2018-09-02 ENCOUNTER — Ambulatory Visit: Payer: BC Managed Care – PPO | Admitting: Podiatry

## 2018-09-02 NOTE — Progress Notes (Signed)
Subjective: 58 year old female presents the office today for concerns of right foot pain that is been ongoing last couple of days.  She states she is getting pain on the third toe.  She points on the midfoot area behind the third toe where she is majority discomfort.  No significant swelling she denies any recent injury or trauma.  Because of the pain she went into a surgical shoe that she had at home.  She is asking for steroid injection today.  In general she has not noticed any ulcers to her feet she denies any redness or swelling.  The last saw her she did follow-up with infectious disease and she had x-rays and MRI of her right foot which were negative for osteomyelitis/infection. Denies any systemic complaints such as fevers, chills, nausea, vomiting. No acute changes since last appointment, and no other complaints at this time.   Objective: AAO x3, NAD; obese DP/PT pulses palpable bilaterally, CRT less than 3 seconds There is tenderness palpation along the dorsal aspect of the right foot most on the third metatarsal cuneiform joint on the midfoot.  There is no pain to vibratory sensation.  There is no edema.  There is no erythema or warmth. No open lesions or pre-ulcerative lesion identified.  There is no erythema or warmth of the foot is no clinical signs of infection noted today. No pain with calf compression, swelling, warmth, erythema  Assessment: Right foot pain tendinitis/, capsulitis  Plan: -All treatment options discussed with the patient including all alternatives, risks, complications.  -X-rays obtained reviewed.  No definitive evidence of acute fracture or stress fracture. -She is requesting steroid injection.  Discussed risks and complications of this.  Understanding risks she wished to proceed.  The skin was prepped with alcohol and mixture of 1 cc Kenalog 10, 0.5 cc of Marcaine plain, 0.5 cc of lidocaine plain was infiltrated into the area of maximal tenderness without  complications.  Postinjection care was discussed. -Continue with surgical shoe for now. -Ice. -Patient encouraged to call the office with any questions, concerns, change in symptoms.   Return in about 2 weeks (around 09/12/2018).- If still having pain will get a new x-ray  Trula Slade DPM

## 2018-09-12 ENCOUNTER — Ambulatory Visit: Payer: BC Managed Care – PPO | Admitting: Podiatry

## 2018-11-11 ENCOUNTER — Telehealth: Payer: Self-pay | Admitting: Hematology

## 2018-11-11 NOTE — Telephone Encounter (Signed)
Called and spoke with patient regarding appointment date/time/location/  She was quite upset [facility].

## 2018-11-29 ENCOUNTER — Other Ambulatory Visit: Payer: Self-pay | Admitting: *Deleted

## 2018-11-29 DIAGNOSIS — D72829 Elevated white blood cell count, unspecified: Secondary | ICD-10-CM

## 2018-11-30 NOTE — Progress Notes (Signed)
Marland Kitchen    HEMATOLOGY/ONCOLOGY CONSULTATION NOTE  Date of Service: 12/02/2018  Patient Care Team: Jonathon Jordan, MD as PCP - General (Family Medicine)  CHIEF COMPLAINTS/PURPOSE OF CONSULTATION:  B/L DVT and PE  HISTORY OF PRESENTING ILLNESS:   Natasha Rollins is a wonderful 58 y.o. female who has been referred to [place] by Dr .Jonathon Jordan, MD / Dr Jonathon Jordan MD for evaluation and management of recent findings of bilateral DVT and pulmonary embolism in February 2018.  Patient has a history of hypertension, diabetes, morbid obesity.Body mass index is 65.33 kg/m. Who presented to [facility] and was admitted from 06/20/2016-06/22/2016 with acute pulmonary embolism and bilateral lower extremity DVT.  Patient reports that she had flulike symptoms and progressive shortness of breath. She was empirically treated as outpatient with Z-Pak, Tessalon, Flonase, Tamiflu, levofloxacin after which a d-dimer was checked and noted to be elevated. Patient was sent to the emergency room had a CTA of the chest which showed pulmonary embolus within her left and pulmonary artery extending into the left upper and lower lobes. Also noted to have a pulmonary embolus within the segmental pulmonary artery branch to the right lower lobe.  Ultrasound of the lower extremity veins showed evidence of acute DVT involving the popliteal vein of the right lower extremity. Acute DVT involving the posterior tibial veins of the left lower extremity.  Patient was started on IV heparin and was eventually transitioned to Xarelto on discharge.  Risk factors noted were No personal or family history of VTE. No personal history of cancer or OCP use or long distance travel. However gives history of relative immobility for 2 weeks after some form of gel injection into bilateral knees on 04/21/16 when she had severe bilateral leg pains. Sedentary lifestyle. Recent significant pulmonary infection.  Patient has significant  discomfort from bilateral knee arthritis at baseline for which she follows with orthopedics. She is following with her primary care physician for pain management of this.  She was sent to [place] for further evaluation and recommendations regarding anticoagulation.  Patient notes no issues taking her Xarelto. Notes that her respiratory symptoms including shortness of breath have improved significantly. Her Pain is improving and she still has her chronic knee pain from arthritis.  No evidence for bleeding.  She notes she is up to speed with her age-appropriate cancer screening. Had her last mammogram in October 2017 which was negative. She notes her last colonoscopy was about 5 years back and that she is due for another one. She notes that her Pap smear. Is due in May 2018.  No other reported. Focal symptoms.  Interval History  Natasha Rollins is a 58 y.o. female here today for a follow up regarding her leukocytosis and thrombocytosis, referred by Dr. Jonathon Jordan. She was seen here in 2018 for B/L DVT and PE. The patient's last visit with [place] was on 08/17/2016. The pt reports that she is doing well overall.  The pt reports that she is still taking Coumadin and has not had any new blood clots since her visit in 2018. She had a series of foot XRays from 02/2018-04/2018 for infections. Her Hidradenitis has been active but has improved and she is not taking any antibiotics right now. She is seeing a Psychologist, sport and exercise in August for a possible hernia. She also developed rosacea on her face which resolved with Metronidazole.   She has arthritis in her knees, for which she received cortisone shots on June 5. Sometimes her legs also feel warm. She  takes one Vicodin and one Tylenol, which she feels is not working. Her levels on the Coumadin have been stable.  Lab results today (12/02/2018) of CBC w/diff is as follows: all values are WNL except for WBC at 12.5k, HGB at 10.9, MCH at 25.6, RDW at 16.2, PLT at 423k,  neutro abs at 9.2k, monocytes abs at 1.1k 12/02/2018 CMP is pending  On review of systems, pt reports knee pain, moving her bowels well and denies abnormal vaginal discharge, concerns with infections under skin folds, calf pain, belly pain and any other symptoms.    MEDICAL HISTORY:  Past Medical History:  Diagnosis Date  . Diabetes mellitus without complication ([place])   . Fat necrosis 05/28/2018  . Hidradenitis suppurativa 05/28/2018  . Hypertension   . Right second toe ulcer ([place]) 05/28/2018  Morbid obesity Body mass index is 65.33 kg/m. Vitamin D deficiency Rosacea Hidradenitis  SURGICAL HISTORY: Past Surgical History:  Procedure Laterality Date  . CHOLECYSTECTOMY    . HERNIA REPAIR    . TONSILLECTOMY      SOCIAL HISTORY: Social History   Socioeconomic History  . Marital status: Married    Spouse name: Not on file  . Number of children: Not on file  . Years of education: Not on file  . Highest education level: Not on file  Occupational History  . Not on file  Social Needs  . Financial resource strain: Not on file  . Food insecurity    Worry: Not on file    Inability: Not on file  . Transportation needs    Medical: Not on file    Non-medical: Not on file  Tobacco Use  . Smoking status: Never Smoker  . Smokeless tobacco: Never Used  Substance and Sexual Activity  . Alcohol use: No  . Drug use: No  . Sexual activity: Yes  Lifestyle  . Physical activity    Days per week: Not on file    Minutes per session: Not on file  . Stress: Not on file  Relationships  . Social Herbalist on phone: Not on file    Gets together: Not on file    Attends religious service: Not on file    Active member of club or organization: Not on file    Attends meetings of clubs or organizations: Not on file    Relationship status: Not on file  . Intimate partner violence    Fear of current or ex partner: Not on file    Emotionally abused: Not on file    Physically  abused: Not on file    Forced sexual activity: Not on file  Other Topics Concern  . Not on file  Social History Narrative  . Not on file    FAMILY HISTORY: No family history on file.  ALLERGIES:  is allergic to penicillins.  MEDICATIONS:  Current Outpatient Medications  Medication Sig Dispense Refill  . acetaminophen (TYLENOL) 325 MG tablet Take 2 tablets (650 mg total) by mouth every 6 (six) hours as needed for mild pain, moderate pain or headache (or Fever >/= 101).    Marland Kitchen amLODipine (NORVASC) 10 MG tablet Take 10 mg by mouth daily.  8  . atorvastatin (LIPITOR) 10 MG tablet TAKE 1 TABLET BY MOUTH 3 DAYS A WEEK  0  . folic acid (FOLVITE) 1 MG tablet Take 1 mg by mouth daily.    Marland Kitchen HYDROcodone-acetaminophen (NORCO/VICODIN) 5-325 MG tablet hydrocodone 5 mg-acetaminophen 325 mg tablet  Take 1 tablet  every 6 hours by oral route.    Marland Kitchen JANUVIA 100 MG tablet Take 100 mg by mouth daily.  4  . LEVEMIR FLEXTOUCH 100 UNIT/ML Pen Inject 75 Units into the skin 2 (two) times daily.     Marland Kitchen losartan-hydrochlorothiazide (HYZAAR) 100-25 MG tablet Take 1 tablet by mouth daily.  4  . metroNIDAZOLE (METROGEL) 1 % gel APPLY TO AFFECTED AREA EVERY DAY  1  . mupirocin ointment (BACTROBAN) 2 % Apply 1 application topically 2 (two) times daily. 30 g 2  . NOVOLOG FLEXPEN 100 UNIT/ML FlexPen Inject 30 Units into the skin 3 (three) times daily with meals.     . TRADJENTA 5 MG TABS tablet Take 5 mg by mouth daily.   5  . warfarin (COUMADIN) 5 MG tablet Take 1 tablet (5 mg total) by mouth daily at 6 PM. 30 tablet 0   No current facility-administered medications for this visit.     REVIEW OF SYSTEMS:    A 10+ POINT REVIEW OF SYSTEMS WAS OBTAINED including neurology, dermatology, psychiatry, cardiac, respiratory, lymph, extremities, GI, GU, Musculoskeletal, constitutional, breasts, reproductive, HEENT.  All pertinent positives are noted in the HPI.  All others are negative.   PHYSICAL EXAMINATION: ECOG  PERFORMANCE STATUS: 2 - Symptomatic, <50% confined to bed  . Vitals:   12/02/18 1455  BP: 129/63  Pulse: (!) 106  Resp: 17  Temp: 98.7 F (37.1 C)  SpO2: 99%   Filed Weights   12/02/18 1455  Weight: (!) 380 lb 9.6 oz (172.6 kg)   .Body mass index is 65.33 kg/m.   GENERAL:alert, in no acute distress and comfortable SKIN: no acute rashes, no significant lesions EYES: conjunctiva are pink and non-injected, sclera anicteric OROPHARYNX: MMM, no exudates, no oropharyngeal erythema or ulceration NECK: supple, no JVD LYMPH:  no palpable lymphadenopathy in the cervical, axillary or inguinal regions LUNGS: clear to auscultation b/l with normal respiratory effort HEART: regular rate & rhythm ABDOMEN:  normoactive bowel sounds , non tender, not distended. No palpable hepatosplenomegaly.  Extremity: +1 pedal edema, legs to not feel warm PSYCH: alert & oriented x 3 with fluent speech NEURO: no focal motor/sensory deficits   LABORATORY DATA:  I have reviewed the data as listed  . CBC Latest Ref Rng & Units 12/02/2018 05/28/2018 09/29/2016  WBC 4.0 - 10.5 K/uL 12.5(H) 11.7(H) 9.4  Hemoglobin 12.0 - 15.0 g/dL 10.9(L) 11.4(L) 10.5(A)  Hematocrit 36.0 - 46.0 % 36.0 34.6(L) 30.9(A)  Platelets 150 - 400 K/uL 423(H) 461(H) -    . CBC    Component Value Date/Time   WBC 12.5 (H) 12/02/2018 1435   WBC 11.7 (H) 05/28/2018 1043   RBC 4.26 12/02/2018 1435   HGB 10.9 (L) 12/02/2018 1435   HGB WILL FOLLOW 08/03/2016 1804   HCT 36.0 12/02/2018 1435   HCT WILL FOLLOW 08/03/2016 1804   PLT 423 (H) 12/02/2018 1435   PLT WILL FOLLOW 08/03/2016 1804   MCV 84.5 12/02/2018 1435   MCV 85.9 09/29/2016 1816   MCV WILL FOLLOW 08/03/2016 1804   MCH 25.6 (L) 12/02/2018 1435   MCHC 30.3 12/02/2018 1435   RDW 16.2 (H) 12/02/2018 1435   RDW WILL FOLLOW 08/03/2016 1804   LYMPHSABS 2.0 12/02/2018 1435   LYMPHSABS WILL FOLLOW 08/03/2016 1804   MONOABS 1.1 (H) 12/02/2018 1435   EOSABS 0.1 12/02/2018  1435   EOSABS WILL FOLLOW 08/03/2016 1804   BASOSABS 0.1 12/02/2018 1435   BASOSABS WILL FOLLOW 08/03/2016 1804     .  CMP Latest Ref Rng & Units 12/02/2018 05/28/2018 09/29/2016  Glucose 70 - 99 mg/dL 108(H) 81 135(H)  BUN 6 - 20 mg/dL 19 18 23   Creatinine 0.44 - 1.00 mg/dL 1.19(H) 0.86 1.28(H)  Sodium 135 - 145 mmol/L 139 141 137  Potassium 3.5 - 5.1 mmol/L 3.5 4.3 4.2  Chloride 98 - 111 mmol/L 100 103 96  CO2 22 - 32 mmol/L 30 30 23   Calcium 8.9 - 10.3 mg/dL 9.7 9.5 9.5  Total Protein 6.5 - 8.1 g/dL 8.0 7.4 7.4  Total Bilirubin 0.3 - 1.2 mg/dL 0.4 0.3 0.3  Alkaline Phos 38 - 126 U/L 72 - 72  AST 15 - 41 U/L 18 13 27   ALT 0 - 44 U/L 18 12 34(H)     RADIOGRAPHIC STUDIES: I have personally reviewed the radiological images as listed and agreed with the findings in the report. No results found.  ASSESSMENT & PLAN:   58 year old female with morbid obesity, hypertension, diabetes with  #1 h/o Extensive bilateral pulmonary embolism. #2 bilateral lower extremity DVT  Patient was diagnosed with these in mid February 2018 and has been treated with Xarelto with improvement in her symptoms. The etiology of her venous thromboembolism can certainly be attributed to multiple acquired risk factors including morbid obesity, secondary lifestyle, recently more immobile due to bilateral knee problems, significant respiratory infection preceding her venous thromboembolism.  No family history of venous thromboembolism. No previous personal history of venous thromboembolism. No exposure to hormonal risk factors. Nonsmoker.  Was seen at [facility] and recommended lifelong anticoagulation. PLAN: -Discussed pt labwork today, 12/02/2018; neutrophils are elevated -Discussed that her elevated white counts could be due to a number of factors including inflammation, recent cortisone shots, hidradenitis  etc.  -Discussed that because white counts and platelets have decreased since her last labs,  elevation is likely due to inflammation rather than a bone marrow disorder. -Molecular testing to rule out genetic mutation BCR ABLFISH, JAK2/CALR/MPL mutation . If negative, continue to monitor labs with Dr. Stephanie Acre -continue coumadin with monitoring with PCP  -Return to lab for additional labs today -Phone visit in 2 weeks with Dr Irene Limbo   All of the patients questions were answered with apparent satisfaction. The patient knows to call the clinic with any problems, questions or concerns.  The total time spent in the appt was 25 minutes and more than 50% was on counseling and direct patient cares.   Sullivan Lone MD MS AAHIVMS [facility] Hematology/Oncology Physician [facility]  (Office):       [phone] (Work cell):  [phone] (Fax):           [phone]  12/02/2018 3:20 PM  I, De Burrs, am acting as a scribe for Dr. Irene Limbo  .I have reviewed the above documentation for accuracy and completeness, and I agree with the above. Brunetta Genera MD

## 2018-12-02 ENCOUNTER — Other Ambulatory Visit: Payer: Self-pay

## 2018-12-02 ENCOUNTER — Encounter: Payer: Self-pay | Admitting: Hematology

## 2018-12-02 ENCOUNTER — Inpatient Hospital Stay (HOSPITAL_BASED_OUTPATIENT_CLINIC_OR_DEPARTMENT_OTHER): Payer: BC Managed Care – PPO | Admitting: Hematology

## 2018-12-02 ENCOUNTER — Telehealth: Payer: Self-pay | Admitting: Hematology

## 2018-12-02 ENCOUNTER — Inpatient Hospital Stay: Payer: BC Managed Care – PPO

## 2018-12-02 ENCOUNTER — Inpatient Hospital Stay: Payer: BC Managed Care – PPO | Attending: Hematology

## 2018-12-02 VITALS — BP 129/63 | HR 106 | Temp 98.7°F | Resp 17 | Ht 64.0 in | Wt 380.6 lb

## 2018-12-02 DIAGNOSIS — Z7901 Long term (current) use of anticoagulants: Secondary | ICD-10-CM | POA: Diagnosis not present

## 2018-12-02 DIAGNOSIS — Z86718 Personal history of other venous thrombosis and embolism: Secondary | ICD-10-CM | POA: Diagnosis present

## 2018-12-02 DIAGNOSIS — D75839 Thrombocytosis, unspecified: Secondary | ICD-10-CM

## 2018-12-02 DIAGNOSIS — Z86711 Personal history of pulmonary embolism: Secondary | ICD-10-CM | POA: Diagnosis not present

## 2018-12-02 DIAGNOSIS — D72829 Elevated white blood cell count, unspecified: Secondary | ICD-10-CM

## 2018-12-02 DIAGNOSIS — I10 Essential (primary) hypertension: Secondary | ICD-10-CM | POA: Diagnosis not present

## 2018-12-02 DIAGNOSIS — D473 Essential (hemorrhagic) thrombocythemia: Secondary | ICD-10-CM

## 2018-12-02 DIAGNOSIS — E119 Type 2 diabetes mellitus without complications: Secondary | ICD-10-CM | POA: Insufficient documentation

## 2018-12-02 DIAGNOSIS — E559 Vitamin D deficiency, unspecified: Secondary | ICD-10-CM | POA: Insufficient documentation

## 2018-12-02 LAB — CMP (CANCER CENTER ONLY)
ALT: 18 U/L (ref 0–44)
AST: 18 U/L (ref 15–41)
Albumin: 3.4 g/dL — ABNORMAL LOW (ref 3.5–5.0)
Alkaline Phosphatase: 72 U/L (ref 38–126)
Anion gap: 9 (ref 5–15)
BUN: 19 mg/dL (ref 6–20)
CO2: 30 mmol/L (ref 22–32)
Calcium: 9.7 mg/dL (ref 8.9–10.3)
Chloride: 100 mmol/L (ref 98–111)
Creatinine: 1.19 mg/dL — ABNORMAL HIGH (ref 0.44–1.00)
GFR, Est AFR Am: 58 mL/min — ABNORMAL LOW (ref >60)
GFR, Estimated: 50 mL/min — ABNORMAL LOW (ref >60)
Glucose, Bld: 108 mg/dL — ABNORMAL HIGH (ref 70–99)
Potassium: 3.5 mmol/L (ref 3.5–5.1)
Sodium: 139 mmol/L (ref 135–145)
Total Bilirubin: 0.4 mg/dL (ref 0.3–1.2)
Total Protein: 8 g/dL (ref 6.5–8.1)

## 2018-12-02 LAB — CBC WITH DIFFERENTIAL (CANCER CENTER ONLY)
Abs Immature Granulocytes: 0.07 10*3/uL (ref 0.00–0.07)
Basophils Absolute: 0.1 10*3/uL (ref 0.0–0.1)
Basophils Relative: 1 %
Eosinophils Absolute: 0.1 10*3/uL (ref 0.0–0.5)
Eosinophils Relative: 1 %
HCT: 36 % (ref 36.0–46.0)
Hemoglobin: 10.9 g/dL — ABNORMAL LOW (ref 12.0–15.0)
Immature Granulocytes: 1 %
Lymphocytes Relative: 16 %
Lymphs Abs: 2 10*3/uL (ref 0.7–4.0)
MCH: 25.6 pg — ABNORMAL LOW (ref 26.0–34.0)
MCHC: 30.3 g/dL (ref 30.0–36.0)
MCV: 84.5 fL (ref 80.0–100.0)
Monocytes Absolute: 1.1 10*3/uL — ABNORMAL HIGH (ref 0.1–1.0)
Monocytes Relative: 9 %
Neutro Abs: 9.2 10*3/uL — ABNORMAL HIGH (ref 1.7–7.7)
Neutrophils Relative %: 72 %
Platelet Count: 423 10*3/uL — ABNORMAL HIGH (ref 150–400)
RBC: 4.26 MIL/uL (ref 3.87–5.11)
RDW: 16.2 % — ABNORMAL HIGH (ref 11.5–15.5)
WBC Count: 12.5 10*3/uL — ABNORMAL HIGH (ref 4.0–10.5)
nRBC: 0 % (ref 0.0–0.2)

## 2018-12-02 LAB — SEDIMENTATION RATE: Sed Rate: 51 mm/hr — ABNORMAL HIGH (ref 0–22)

## 2018-12-02 NOTE — Telephone Encounter (Signed)
Gave avs and calendar ° °

## 2018-12-11 ENCOUNTER — Telehealth: Payer: Self-pay | Admitting: *Deleted

## 2018-12-11 NOTE — Telephone Encounter (Signed)
Contacted by Edwena Felty w/[facility] regarding request for JAK2 (including [id number] and Exon 12), MPL, and CALR-Next Generation Sequencing ordered 12/02/2018. Verification/values needed for TWO separate WBC counts - required >11 and >3 months apart: 12/02/2018 was 12.5; 05/28/2018 was 11.7.

## 2018-12-15 NOTE — Progress Notes (Signed)
Marland Kitchen    HEMATOLOGY/ONCOLOGY CONSULTATION NOTE  Date of Service: 12/15/2018  Patient Care Team: Jonathon Jordan, MD as PCP - General (Family Medicine)  CHIEF COMPLAINTS/PURPOSE OF CONSULTATION:  B/L DVT and PE  HISTORY OF PRESENTING ILLNESS:   Natasha Rollins is a wonderful 58 y.o. female who has been referred to [place] by Dr .Jonathon Jordan, MD / Dr Jonathon Jordan MD for evaluation and management of recent findings of bilateral DVT and pulmonary embolism in February 2018.  Patient has a history of hypertension, diabetes, morbid obesity.There is no height or weight on file to calculate BMI. Who presented to [facility] and was admitted from 06/20/2016-06/22/2016 with acute pulmonary embolism and bilateral lower extremity DVT.  Patient reports that she had flulike symptoms and progressive shortness of breath. She was empirically treated as outpatient with Z-Pak, Tessalon, Flonase, Tamiflu, levofloxacin after which a d-dimer was checked and noted to be elevated. Patient was sent to the emergency room had a CTA of the chest which showed pulmonary embolus within her left and pulmonary artery extending into the left upper and lower lobes. Also noted to have a pulmonary embolus within the segmental pulmonary artery branch to the right lower lobe.  Ultrasound of the lower extremity veins showed evidence of acute DVT involving the popliteal vein of the right lower extremity. Acute DVT involving the posterior tibial veins of the left lower extremity.  Patient was started on IV heparin and was eventually transitioned to Xarelto on discharge.  Risk factors noted were No personal or family history of VTE. No personal history of cancer or OCP use or long distance travel. However gives history of relative immobility for 2 weeks after some form of gel injection into bilateral knees on 04/21/16 when she had severe bilateral leg pains. Sedentary lifestyle. Recent significant pulmonary infection.  Patient  has significant discomfort from bilateral knee arthritis at baseline for which she follows with orthopedics. She is following with her primary care physician for pain management of this.  She was sent to [place] for further evaluation and recommendations regarding anticoagulation.  Patient notes no issues taking her Xarelto. Notes that her respiratory symptoms including shortness of breath have improved significantly. Her Pain is improving and she still has her chronic knee pain from arthritis.  No evidence for bleeding.  She notes she is up to speed with her age-appropriate cancer screening. Had her last mammogram in October 2017 which was negative. She notes her last colonoscopy was about 5 years back and that she is due for another one. She notes that her Pap smear. Is due in May 2018.  No other reported. Focal symptoms.  Interval History  I connected with [name] on 12/16/2018 at  2:00 PM EDT by telephone and verified that I am speaking with the correct person using two identifiers.  I discussed the limitations, risks, security and privacy concerns of performing an evaluation and management service by telemedicine and the availability of in-person appointments. I also discussed with the patient that there may be a patient responsible charge related to this service. The patient expressed understanding and agreed to proceed.   Other persons participating in the visit and their role in the encounter: none  Patient's location: home Provider's location: my office at the [facility] is a 58 y.o. female called today for a follow up regarding her leukocytosis and thrombocytosis. The patient's last visit with [place] was on 12/02/2018. The pt reports that she is doing well overall.  The pt reports no new concerns.  Most recent lab results from 12/02/2018 of CBC w/diff and CMP is as follows: all values are WNL except for WBC at 12.5k, HGB at 10.9, MCH at 25.6,  RDW at 16.2, PLT at 423k, neutro abs at 9.2k, monocytes abs at 1.1k, glucose bld at 108, Creatinine at 1.19, Albumin at 3.4, GFR at 58. 12/02/2018 sed rate at 51  On review of systems, pt reports no new concerns and denies any other symptoms.   MEDICAL HISTORY:  Past Medical History:  Diagnosis Date  . Diabetes mellitus without complication ([place])   . Fat necrosis 05/28/2018  . Hidradenitis suppurativa 05/28/2018  . Hypertension   . Right second toe ulcer ([place]) 05/28/2018  Morbid obesity There is no height or weight on file to calculate BMI. Vitamin D deficiency Rosacea Hidradenitis  SURGICAL HISTORY: Past Surgical History:  Procedure Laterality Date  . CHOLECYSTECTOMY    . HERNIA REPAIR    . TONSILLECTOMY      SOCIAL HISTORY: Social History   Socioeconomic History  . Marital status: Married    Spouse name: Not on file  . Number of children: Not on file  . Years of education: Not on file  . Highest education level: Not on file  Occupational History  . Not on file  Social Needs  . Financial resource strain: Not on file  . Food insecurity    Worry: Not on file    Inability: Not on file  . Transportation needs    Medical: Not on file    Non-medical: Not on file  Tobacco Use  . Smoking status: Never Smoker  . Smokeless tobacco: Never Used  Substance and Sexual Activity  . Alcohol use: No  . Drug use: No  . Sexual activity: Yes  Lifestyle  . Physical activity    Days per week: Not on file    Minutes per session: Not on file  . Stress: Not on file  Relationships  . Social Herbalist on phone: Not on file    Gets together: Not on file    Attends religious service: Not on file    Active member of club or organization: Not on file    Attends meetings of clubs or organizations: Not on file    Relationship status: Not on file  . Intimate partner violence    Fear of current or ex partner: Not on file    Emotionally abused: Not on file    Physically  abused: Not on file    Forced sexual activity: Not on file  Other Topics Concern  . Not on file  Social History Narrative  . Not on file    FAMILY HISTORY: No family history on file.  ALLERGIES:  is allergic to penicillins.  MEDICATIONS:  Current Outpatient Medications  Medication Sig Dispense Refill  . acetaminophen (TYLENOL) 325 MG tablet Take 2 tablets (650 mg total) by mouth every 6 (six) hours as needed for mild pain, moderate pain or headache (or Fever >/= 101).    Marland Kitchen amLODipine (NORVASC) 10 MG tablet Take 10 mg by mouth daily.  8  . atorvastatin (LIPITOR) 10 MG tablet TAKE 1 TABLET BY MOUTH 3 DAYS A WEEK  0  . folic acid (FOLVITE) 1 MG tablet Take 1 mg by mouth daily.    Marland Kitchen HYDROcodone-acetaminophen (NORCO/VICODIN) 5-325 MG tablet hydrocodone 5 mg-acetaminophen 325 mg tablet  Take 1 tablet every 6 hours by oral route.    Marland Kitchen  JANUVIA 100 MG tablet Take 100 mg by mouth daily.  4  . LEVEMIR FLEXTOUCH 100 UNIT/ML Pen Inject 75 Units into the skin 2 (two) times daily.     Marland Kitchen losartan-hydrochlorothiazide (HYZAAR) 100-25 MG tablet Take 1 tablet by mouth daily.  4  . metroNIDAZOLE (METROGEL) 1 % gel APPLY TO AFFECTED AREA EVERY DAY  1  . mupirocin ointment (BACTROBAN) 2 % Apply 1 application topically 2 (two) times daily. 30 g 2  . NOVOLOG FLEXPEN 100 UNIT/ML FlexPen Inject 30 Units into the skin 3 (three) times daily with meals.     . TRADJENTA 5 MG TABS tablet Take 5 mg by mouth daily.   5  . warfarin (COUMADIN) 5 MG tablet Take 1 tablet (5 mg total) by mouth daily at 6 PM. 30 tablet 0   No current facility-administered medications for this visit.     REVIEW OF SYSTEMS:    A 10+ POINT REVIEW OF SYSTEMS WAS OBTAINED including neurology, dermatology, psychiatry, cardiac, respiratory, lymph, extremities, GI, GU, Musculoskeletal, constitutional, breasts, reproductive, HEENT.  All pertinent positives are noted in the HPI.  All others are negative.   PHYSICAL EXAMINATION: ECOG  PERFORMANCE STATUS: 2 - Symptomatic, <50% confined to bed  . There were no vitals filed for this visit. There were no vitals filed for this visit. .There is no height or weight on file to calculate BMI.  Phone Visit  LABORATORY DATA:  I have reviewed the data as listed  . CBC Latest Ref Rng & Units 12/02/2018 05/28/2018 09/29/2016  WBC 4.0 - 10.5 K/uL 12.5(H) 11.7(H) 9.4  Hemoglobin 12.0 - 15.0 g/dL 10.9(L) 11.4(L) 10.5(A)  Hematocrit 36.0 - 46.0 % 36.0 34.6(L) 30.9(A)  Platelets 150 - 400 K/uL 423(H) 461(H) -    . CBC    Component Value Date/Time   WBC 12.5 (H) 12/02/2018 1435   WBC 11.7 (H) 05/28/2018 1043   RBC 4.26 12/02/2018 1435   HGB 10.9 (L) 12/02/2018 1435   HGB WILL FOLLOW 08/03/2016 1804   HCT 36.0 12/02/2018 1435   HCT WILL FOLLOW 08/03/2016 1804   PLT 423 (H) 12/02/2018 1435   PLT WILL FOLLOW 08/03/2016 1804   MCV 84.5 12/02/2018 1435   MCV 85.9 09/29/2016 1816   MCV WILL FOLLOW 08/03/2016 1804   MCH 25.6 (L) 12/02/2018 1435   MCHC 30.3 12/02/2018 1435   RDW 16.2 (H) 12/02/2018 1435   RDW WILL FOLLOW 08/03/2016 1804   LYMPHSABS 2.0 12/02/2018 1435   LYMPHSABS WILL FOLLOW 08/03/2016 1804   MONOABS 1.1 (H) 12/02/2018 1435   EOSABS 0.1 12/02/2018 1435   EOSABS WILL FOLLOW 08/03/2016 1804   BASOSABS 0.1 12/02/2018 1435   BASOSABS WILL FOLLOW 08/03/2016 1804     . CMP Latest Ref Rng & Units 12/02/2018 05/28/2018 09/29/2016  Glucose 70 - 99 mg/dL 108(H) 81 135(H)  BUN 6 - 20 mg/dL 19 18 23   Creatinine 0.44 - 1.00 mg/dL 1.19(H) 0.86 1.28(H)  Sodium 135 - 145 mmol/L 139 141 137  Potassium 3.5 - 5.1 mmol/L 3.5 4.3 4.2  Chloride 98 - 111 mmol/L 100 103 96  CO2 22 - 32 mmol/L 30 30 23   Calcium 8.9 - 10.3 mg/dL 9.7 9.5 9.5  Total Protein 6.5 - 8.1 g/dL 8.0 7.4 7.4  Total Bilirubin 0.3 - 1.2 mg/dL 0.4 0.3 0.3  Alkaline Phos 38 - 126 U/L 72 - 72  AST 15 - 41 U/L 18 13 27   ALT 0 - 44 U/L 18 12 34(H)  12/02/2018 BCR ABL: No evidence of BCR/ABL rearrangement   12/02/2018 JAK2, MPL, CALR all negative   RADIOGRAPHIC STUDIES: I have personally reviewed the radiological images as listed and agreed with the findings in the report. No results found.  ASSESSMENT & PLAN:   58 year old female with morbid obesity, hypertension, diabetes with  #1 h/o Extensive bilateral pulmonary embolism. #2 bilateral lower extremity DVT  Patient was diagnosed with these in mid February 2018 and has been treated with Xarelto with improvement in her symptoms. The etiology of her venous thromboembolism can certainly be attributed to multiple acquired risk factors including morbid obesity, secondary lifestyle, recently more immobile due to bilateral knee problems, significant respiratory infection preceding her venous thromboembolism.  No family history of venous thromboembolism. No previous personal history of venous thromboembolism. No exposure to hormonal risk factors. Nonsmoker.  Was seen at [facility] and recommended lifelong anticoagulation.  PLAN: -Discussed most recent lab work from 12/02/2018; mild anemia, PLTs slightly elevated at 423k, sed rate at 51 -Genetic mutation testing is not indicative of a bone marrow problem. BCR-ABL, JAK2, MPL, and CALR all negative -Discussed that her elevated white counts could be due to a number of factors including inflammation, rosacea, toe ulcer, hidradenitis, etc.  -Continue coumadin monitoring with PCP -Recommend F/U with PCP Dr. Stephanie Acre. No indication for further work up at this time.   RTC with Dr Irene Limbo as needed   All of the patients questions were answered with apparent satisfaction. The patient knows to call the clinic with any problems, questions or concerns.  The total time spent in the appt was 15 minutes and more than 50% was on counseling and direct patient cares.  Sullivan Lone MD MS AAHIVMS [facility] Hematology/Oncology Physician [facility]  (Office):       [phone] (Work cell):   [phone] (Fax):           [phone]  12/15/2018 10:36 PM  I, De Burrs, am acting as a scribe for Dr. Irene Limbo  .I have reviewed the above documentation for accuracy and completeness, and I agree with the above. Brunetta Genera MD

## 2018-12-16 ENCOUNTER — Inpatient Hospital Stay: Payer: BC Managed Care – PPO | Attending: Hematology | Admitting: Hematology

## 2018-12-16 ENCOUNTER — Ambulatory Visit (INDEPENDENT_AMBULATORY_CARE_PROVIDER_SITE_OTHER): Payer: BC Managed Care – PPO

## 2018-12-16 ENCOUNTER — Encounter: Payer: Self-pay | Admitting: Podiatry

## 2018-12-16 ENCOUNTER — Ambulatory Visit: Payer: BC Managed Care – PPO | Admitting: Podiatry

## 2018-12-16 ENCOUNTER — Other Ambulatory Visit: Payer: Self-pay

## 2018-12-16 DIAGNOSIS — S8265XA Nondisplaced fracture of lateral malleolus of left fibula, initial encounter for closed fracture: Secondary | ICD-10-CM | POA: Diagnosis not present

## 2018-12-16 DIAGNOSIS — S99912A Unspecified injury of left ankle, initial encounter: Secondary | ICD-10-CM | POA: Diagnosis not present

## 2018-12-16 DIAGNOSIS — D473 Essential (hemorrhagic) thrombocythemia: Secondary | ICD-10-CM | POA: Diagnosis not present

## 2018-12-16 DIAGNOSIS — M779 Enthesopathy, unspecified: Secondary | ICD-10-CM

## 2018-12-16 DIAGNOSIS — D72829 Elevated white blood cell count, unspecified: Secondary | ICD-10-CM | POA: Diagnosis not present

## 2018-12-16 DIAGNOSIS — D75839 Thrombocytosis, unspecified: Secondary | ICD-10-CM

## 2018-12-18 NOTE — Progress Notes (Signed)
Subjective: 58 year old female presents the office today for concerns of left ankle pain.  She states she fell last week leaving the house.  She has had difficulty walking as she still getting pain.  She had no recent treatment.  She states that she did not come in sooner because she did not want going to that.  Denies any systemic complaints such as fevers, chills, nausea, vomiting. No acute changes since last appointment, and no other complaints at this time.   Objective: AAO x3, NAD DP/PT pulses palpable bilaterally, CRT less than 3 seconds There is tenderness palpation directly on the distal aspect.  Mild edema at this area there is no erythema or warmth.  No pain at medial malleolus, fifth metatarsal base, talus, proximal tib-fib or other areas of the foot. No open lesions or pre-ulcerative lesions.  No pain with calf compression, swelling, warmth, erythema  Assessment: Concern for fibula fracture left  Plan: -All treatment options discussed with the patient including all alternatives, risks, complications.  -X-rays obtained reviewed.  There is a small the lucency present of the distal aspect of the fibula.  No displacement. -I do recommend position in a cam boot.  She has went home.  It is difficult for her to wear this.  Did dispense a Tri-Lock ankle brace although I prefer the boot. -Ice elevation. -Patient encouraged to call the office with any questions, concerns, change in symptoms.   Trula Slade DPM

## 2018-12-19 ENCOUNTER — Other Ambulatory Visit: Payer: Self-pay | Admitting: General Surgery

## 2018-12-19 DIAGNOSIS — R222 Localized swelling, mass and lump, trunk: Secondary | ICD-10-CM

## 2018-12-23 LAB — JAK2 (INCLUDING V617F AND EXON 12), MPL,& CALR-NEXT GEN SEQ

## 2018-12-30 ENCOUNTER — Other Ambulatory Visit: Payer: Self-pay

## 2018-12-30 ENCOUNTER — Ambulatory Visit (INDEPENDENT_AMBULATORY_CARE_PROVIDER_SITE_OTHER): Payer: BC Managed Care – PPO

## 2018-12-30 ENCOUNTER — Other Ambulatory Visit: Payer: Self-pay | Admitting: Podiatry

## 2018-12-30 ENCOUNTER — Ambulatory Visit (INDEPENDENT_AMBULATORY_CARE_PROVIDER_SITE_OTHER): Payer: BC Managed Care – PPO | Admitting: Podiatry

## 2018-12-30 VITALS — Temp 97.8°F

## 2018-12-30 DIAGNOSIS — S8265XD Nondisplaced fracture of lateral malleolus of left fibula, subsequent encounter for closed fracture with routine healing: Secondary | ICD-10-CM | POA: Diagnosis not present

## 2018-12-30 DIAGNOSIS — M779 Enthesopathy, unspecified: Secondary | ICD-10-CM | POA: Diagnosis not present

## 2018-12-30 DIAGNOSIS — S8265XA Nondisplaced fracture of lateral malleolus of left fibula, initial encounter for closed fracture: Secondary | ICD-10-CM

## 2018-12-30 NOTE — Patient Instructions (Signed)
For instructions on how to put on your Tri-Lock Ankle Brace, please visit [email or web address]   Ankle Sprain, Phase I Rehab An ankle sprain is an injury to the ligaments of your ankle. Ankle sprains cause stiffness, loss of motion, and loss of strength. Ask your health care provider which exercises are safe for you. Do exercises exactly as told by your health care provider and adjust them as directed. It is normal to feel mild stretching, pulling, tightness, or discomfort as you do these exercises. Stop right away if you feel sudden pain or your pain gets worse. Do not begin these exercises until told by your health care provider. Stretching and range-of-motion exercises These exercises warm up your muscles and joints and improve the movement and flexibility of your lower leg and ankle. These exercises also help to relieve pain and stiffness. Gastroc and soleus stretch This exercise is also called a calf stretch. It stretches the muscles in the back of the lower leg. These muscles are the gastrocnemius, or gastroc, and the soleus. 1. Sit on the floor with your left / right leg extended. 2. Loop a belt or towel around the ball of your left / right foot. The ball of your foot is on the walking surface, right under your toes. 3. Keep your left / right ankle and foot relaxed and keep your knee straight while you use the belt or towel to pull your foot toward you. You should feel a gentle stretch behind your calf or knee in your gastroc muscle. 4. Hold this position for __________ seconds, then release to the starting position. 5. Repeat the exercise with your knee bent. You can put a pillow or a rolled bath towel under your knee to support it. You should feel a stretch deep in your calf in the soleus muscle or at your Achilles tendon. Repeat __________ times. Complete this exercise __________ times a day. Ankle alphabet  1. Sit with your left / right leg supported at the lower leg. ? Do not rest  your foot on anything. ? Make sure your foot has room to move freely. 2. Think of your left / right foot as a paintbrush. ? Move your foot to trace each letter of the alphabet in the air. Keep your hip and knee still while you trace. ? Make the letters as large as you can without feeling discomfort. 3. Trace every letter from A to Z. Repeat __________ times. Complete this exercise __________ times a day. Strengthening exercises These exercises build strength and endurance in your ankle and lower leg. Endurance is the ability to use your muscles for a long time, even after they get tired. Ankle dorsiflexion  1. Secure a rubber exercise band or tube to an object, such as a table leg, that will stay still when the band is pulled. Secure the other end around your left / right foot. 2. Sit on the floor facing the object, with your left / right leg extended. The band or tube should be slightly tense when your foot is relaxed. 3. Slowly bring your foot toward you, bringing the top of your foot toward your shin (dorsiflexion), and pulling the band tighter. 4. Hold this position for __________ seconds. 5. Slowly return your foot to the starting position. Repeat __________ times. Complete this exercise __________ times a day. Ankle plantar flexion  1. Sit on the floor with your left / right leg extended. 2. Loop a rubber exercise tube or band around the ball of your left /  right foot. The ball of your foot is on the walking surface, right under your toes. ? Hold the ends of the band or tube in your hands. ? The band or tube should be slightly tense when your foot is relaxed. 3. Slowly point your foot and toes downward to tilt the top of your foot away from your shin (plantar flexion). 4. Hold this position for __________ seconds. 5. Slowly return your foot to the starting position. Repeat __________ times. Complete this exercise __________ times a day. Ankle eversion 1. Sit on the floor with your  legs straight out in front of you. 2. Loop a rubber exercise band or tube around the ball of your left / right foot. The ball of your foot is on the walking surface, right under your toes. ? Hold the ends of the band in your hands, or secure the band to a stable object. ? The band or tube should be slightly tense when your foot is relaxed. 3. Slowly push your foot outward, away from your other leg (eversion). 4. Hold this position for __________ seconds. 5. Slowly return your foot to the starting position. Repeat __________ times. Complete this exercise __________ times a day. This information is not intended to replace advice given to you by your health care provider. Make sure you discuss any questions you have with your health care provider. Document Released: 11/23/2004 Document Revised: 08/13/2018 Document Reviewed: 02/04/2018 Elsevier Patient Education  2020 [employer].

## 2018-12-31 ENCOUNTER — Ambulatory Visit
Admission: RE | Admit: 2018-12-31 | Discharge: 2018-12-31 | Disposition: A | Discharge status: Home / Self Care | Payer: BC Managed Care – PPO | Source: Ambulatory Visit | Attending: General Surgery | Admitting: General Surgery

## 2018-12-31 DIAGNOSIS — R222 Localized swelling, mass and lump, trunk: Secondary | ICD-10-CM

## 2019-01-01 NOTE — Progress Notes (Signed)
Subjective: 58 year old female presents the office today for evaluation of left ankle pain, concern for fibular fracture.  She states ankle brace was very helpful however she has difficulty getting back on it she cannot remember how to do this.  Overall swelling and pain is improved.  She is able to walk without any significant pain. Denies any systemic complaints such as fevers, chills, nausea, vomiting. No acute changes since last appointment, and no other complaints at this time.   Objective: AAO x3, NAD DP/PT pulses palpable bilaterally, CRT less than 3 seconds Patient is on the distal aspect of the fibula alignment on the peroneal tendon just posterior to the lateral malleolus.  Mild edema but there is no erythema or warmth.  No pain to proximal tib-fib.no pain to the medial ankle, fifth metatarsal base or any other areas of her foot.  Tenderness palpation of the ATFL.  No open lesions or pre-ulcerative lesions.  No pain with calf compression, swelling, warmth, erythema  Assessment: Left ankle sprain, concern for small hairline fracture distal fibula given symptoms  Plan: -All treatment options discussed with the patient including all alternatives, risks, complications.  -X-rays were obtained and reviewed.  No definitive signs of acute fracture but swelling is present. -Continue with a Tri-Lock ankle brace today.  I showed her how to do this as well as gave her information on how to apply this.  Continue ice elevation limit activity.  Continue with supportive care with the brace. Discussed general rehab exercises for her ankle as she starts to feel better. -Patient encouraged to call the office with any questions, concerns, change in symptoms.   Trula Slade DPM

## 2019-01-20 ENCOUNTER — Ambulatory Visit: Payer: BC Managed Care – PPO

## 2019-01-20 ENCOUNTER — Encounter: Payer: BC Managed Care – PPO | Admitting: Podiatry

## 2019-01-20 DIAGNOSIS — S8265XD Nondisplaced fracture of lateral malleolus of left fibula, subsequent encounter for closed fracture with routine healing: Secondary | ICD-10-CM

## 2019-01-20 NOTE — Progress Notes (Signed)
  This encounter was created in error - please disregard. No show 

## 2019-02-17 LAB — BCR ABL1 FISH (GENPATH)

## 2019-04-18 ENCOUNTER — Other Ambulatory Visit (HOSPITAL_BASED_OUTPATIENT_CLINIC_OR_DEPARTMENT_OTHER): Payer: Self-pay | Admitting: Physician Assistant

## 2019-04-18 ENCOUNTER — Encounter (HOSPITAL_BASED_OUTPATIENT_CLINIC_OR_DEPARTMENT_OTHER)
Admission: RE | Admit: 2019-04-18 | Discharge: 2019-04-18 | Disposition: A | Discharge status: Home / Self Care | Payer: BC Managed Care – PPO | Source: Ambulatory Visit | Attending: Physician Assistant | Admitting: Physician Assistant

## 2019-04-18 ENCOUNTER — Other Ambulatory Visit: Payer: Self-pay

## 2019-04-18 DIAGNOSIS — M7989 Other specified soft tissue disorders: Secondary | ICD-10-CM | POA: Insufficient documentation

## 2019-04-18 DIAGNOSIS — M79604 Pain in right leg: Secondary | ICD-10-CM | POA: Insufficient documentation

## 2019-04-18 DIAGNOSIS — M7121 Synovial cyst of popliteal space [Baker], right knee: Secondary | ICD-10-CM | POA: Insufficient documentation

## 2019-05-15 DIAGNOSIS — M7121 Synovial cyst of popliteal space [Baker], right knee: Secondary | ICD-10-CM | POA: Insufficient documentation

## 2019-05-30 ENCOUNTER — Ambulatory Visit: Payer: BC Managed Care – PPO | Admitting: Podiatry

## 2019-06-02 ENCOUNTER — Ambulatory Visit: Payer: BC Managed Care – PPO | Admitting: Podiatry

## 2019-06-02 ENCOUNTER — Encounter: Payer: Self-pay | Admitting: Podiatry

## 2019-06-02 ENCOUNTER — Ambulatory Visit (INDEPENDENT_AMBULATORY_CARE_PROVIDER_SITE_OTHER): Payer: BC Managed Care – PPO

## 2019-06-02 ENCOUNTER — Other Ambulatory Visit: Payer: Self-pay

## 2019-06-02 DIAGNOSIS — M79671 Pain in right foot: Secondary | ICD-10-CM

## 2019-06-02 DIAGNOSIS — S93621A Sprain of tarsometatarsal ligament of right foot, initial encounter: Secondary | ICD-10-CM

## 2019-06-02 DIAGNOSIS — S99921A Unspecified injury of right foot, initial encounter: Secondary | ICD-10-CM

## 2019-06-03 NOTE — Progress Notes (Signed)
Subjective: 59 year old female presents the office today for concerns of right foot pain.  She states that she fell at work originally and April 15, 2019 and she fell again at home a few days later.  She also fell on January 3 when her knee gave out and she states that she bent her foot back.  She states that she does have a lot of pain to her knees and she did not focus on her foot but now that her knees are feeling better she cannot walk because of her foot.  She is difficulty ambulating.  She states that she does get swelling to the foot and ankle which is intermittent.  She has had no recent treatment for her foot. Denies any systemic complaints such as fevers, chills, nausea, vomiting. No acute changes since last appointment, and no other complaints at this time.   Objective: AAO x3, NAD- presents with son who lives in [place]  DP/PT pulses palpable bilaterally, CRT less than 3 seconds Mild diffuse tenderness on the course of the Lisfranc joints on the midfoot.  There is no 1 specific area of pinpoint tenderness.  Mild edema but there is no erythema or warmth.  Ankle, subtalar joint range of motion intact.  There is no other areas of discomfort that I can elicit today. No open lesions or pre-ulcerative lesions.  No pain with calf compression, swelling, warmth, erythema  Assessment: Right foot Lisfranc sprain/injury  Plan: -All treatment options discussed with the patient including all alternatives, risks, complications.  -X-rays obtained reviewed.  Edema is noted with there is no evidence of acute fracture that I can identify today. -Given her continued pain and inability to put full weight on her foot at times recommend immobilization in a cam boot which she already has at home.  An Ace bandage was provided for compression.  Encouraged elevation.  And it was provided for her not to go to work and be able to work from home for the next 3 weeks.  I will see her back in 3 weeks for repeat  x-rays and for further evaluation.  If symptoms continue will order an MRI. -Patient encouraged to call the office with any questions, concerns, change in symptoms.   Trula Slade DPM

## 2019-06-04 ENCOUNTER — Telehealth: Payer: Self-pay | Admitting: *Deleted

## 2019-06-04 NOTE — Telephone Encounter (Signed)
I spoke with pt and told her I could correct her letter and asked how she would like to receive the letter and she requested email. Emailed letter of 06/04/2019.

## 2019-06-04 NOTE — Telephone Encounter (Signed)
Pt states she was seen in office by Dr. Jacqualyn Posey 06/02/2019 and she is to be out of work for 3 weeks, but the date of return is wrong, it should be 06/30/2019.

## 2019-06-25 ENCOUNTER — Encounter (HOSPITAL_BASED_OUTPATIENT_CLINIC_OR_DEPARTMENT_OTHER): Payer: Self-pay | Admitting: Emergency Medicine

## 2019-06-25 ENCOUNTER — Emergency Department (HOSPITAL_BASED_OUTPATIENT_CLINIC_OR_DEPARTMENT_OTHER): Payer: BC Managed Care – PPO

## 2019-06-25 ENCOUNTER — Emergency Department (HOSPITAL_BASED_OUTPATIENT_CLINIC_OR_DEPARTMENT_OTHER)
Admission: EM | Admit: 2019-06-25 | Discharge: 2019-06-25 | Disposition: A | Discharge status: Home / Self Care | Payer: BC Managed Care – PPO | Attending: Emergency Medicine | Admitting: Emergency Medicine

## 2019-06-25 ENCOUNTER — Other Ambulatory Visit: Payer: Self-pay

## 2019-06-25 DIAGNOSIS — Z880 Allergy status to penicillin: Secondary | ICD-10-CM | POA: Insufficient documentation

## 2019-06-25 DIAGNOSIS — S93602A Unspecified sprain of left foot, initial encounter: Secondary | ICD-10-CM | POA: Diagnosis not present

## 2019-06-25 DIAGNOSIS — Z86718 Personal history of other venous thrombosis and embolism: Secondary | ICD-10-CM | POA: Insufficient documentation

## 2019-06-25 DIAGNOSIS — Y929 Unspecified place or not applicable: Secondary | ICD-10-CM | POA: Diagnosis not present

## 2019-06-25 DIAGNOSIS — Z794 Long term (current) use of insulin: Secondary | ICD-10-CM | POA: Diagnosis not present

## 2019-06-25 DIAGNOSIS — S99921A Unspecified injury of right foot, initial encounter: Secondary | ICD-10-CM | POA: Diagnosis present

## 2019-06-25 DIAGNOSIS — Z79899 Other long term (current) drug therapy: Secondary | ICD-10-CM | POA: Diagnosis not present

## 2019-06-25 DIAGNOSIS — X509XXA Other and unspecified overexertion or strenuous movements or postures, initial encounter: Secondary | ICD-10-CM | POA: Insufficient documentation

## 2019-06-25 DIAGNOSIS — W19XXXA Unspecified fall, initial encounter: Secondary | ICD-10-CM

## 2019-06-25 DIAGNOSIS — S93601A Unspecified sprain of right foot, initial encounter: Secondary | ICD-10-CM | POA: Diagnosis not present

## 2019-06-25 DIAGNOSIS — Y999 Unspecified external cause status: Secondary | ICD-10-CM | POA: Insufficient documentation

## 2019-06-25 DIAGNOSIS — I10 Essential (primary) hypertension: Secondary | ICD-10-CM | POA: Insufficient documentation

## 2019-06-25 DIAGNOSIS — E119 Type 2 diabetes mellitus without complications: Secondary | ICD-10-CM | POA: Insufficient documentation

## 2019-06-25 DIAGNOSIS — Y9301 Activity, walking, marching and hiking: Secondary | ICD-10-CM | POA: Insufficient documentation

## 2019-06-25 HISTORY — DX: Morbid (severe) obesity due to excess calories: E66.01

## 2019-06-25 MED ORDER — HYDROCODONE-ACETAMINOPHEN 5-325 MG PO TABS: 1.0000 | ORAL_TABLET | Freq: Four times a day (QID) | ORAL | 0 refills | Status: AC | PRN

## 2019-06-25 MED ORDER — HYDROCODONE-ACETAMINOPHEN 5-325 MG PO TABS
1.0000 | ORAL_TABLET | Freq: Once | ORAL | Status: AC
  Administered 2019-06-25: 1 via ORAL
  Filled 2019-06-25 (×2): qty 1

## 2019-06-25 MED ORDER — HYDROCODONE-ACETAMINOPHEN 5-325 MG PO TABS: 1.0000 | ORAL_TABLET | Freq: Four times a day (QID) | ORAL | 0 refills | Status: DC | PRN

## 2019-06-25 MED FILL — HYDROCODON-APAP 5-325: 5-325 | 2 days supply | Qty: 8 | Fill #0

## 2019-06-25 NOTE — ED Provider Notes (Signed)
[facility] EMERGENCY DEPARTMENT Provider Note   CSN: [id number] Arrival date & time: 06/25/19  1517     History Chief Complaint  Patient presents with  . Foot Pain    Natasha Rollins is a 59 y.o. female.  Patient is a 59 year old female with past medical history of diabetes, morbid obesity, hypertension presenting to the emergency department for right foot pain after fall.  Patient normally ambulates with a walker or in her wheelchair.  Patient reports that yesterday while she was trying to use her walker she accidentally fell forward causing her right foot to hyper plantar flex.  Reports pain since then.  Pain is in the top of her foot and is worse with movement.  Denies any other injuries during the fall.  Has not tried anything for relief.        Past Medical History:  Diagnosis Date  . Diabetes mellitus without complication ([place])   . Fat necrosis 05/28/2018  . Hidradenitis suppurativa 05/28/2018  . Hypertension   . Morbid obesity ([place])   . Right second toe ulcer ([place]) 05/28/2018    Patient Active Problem List   Diagnosis Date Noted  . Spoon shaped nails 07/20/2018  . Right second toe ulcer ([place]) 05/28/2018  . Hidradenitis suppurativa 05/28/2018  . Fat necrosis 05/28/2018  . Osteoarthritis of knee 04/17/2018  . MRSA (methicillin resistant staph aureus) culture positive 03/29/2018  . DVT of lower extremity, bilateral ([place]) 02/19/2018  . Pain in right foot 12/07/2017  . Leukocytosis 06/21/2016  . Pulmonary emboli ([place]) 06/20/2016  . Diabetes ([place]) 12/22/2012  . Hypertension 12/22/2012  . Morbid obesity with BMI of 60.0-69.9, adult ([place]) 07/08/2010  . OVARIAN CYST 07/08/2010    Past Surgical History:  Procedure Laterality Date  . CESAREAN SECTION    . CHOLECYSTECTOMY    . HERNIA REPAIR    . TONSILLECTOMY       OB History   No obstetric history on file.     No family history on file.  Social History   Tobacco Use  . Smoking status: Never  Smoker  . Smokeless tobacco: Never Used  Substance Use Topics  . Alcohol use: No  . Drug use: No    Home Medications Prior to Admission medications   Medication Sig Start Date End Date Taking? Authorizing Provider  amLODipine (NORVASC) 10 MG tablet Take 10 mg by mouth daily. 06/07/14   [provider]  B-D UF III MINI PEN NEEDLES 31G X 5 MM MISC USE AS DIRECTED 5 TIMES A DAY 12/20/18   [provider]  clindamycin (CLEOCIN T) 1 % lotion Apply topically daily. to face 12/22/18   [provider]  clotrimazole-betamethasone (LOTRISONE) cream APPLY TO AFFECTED AREA TWICE A DAY *APPLY THINLY & DONT USE LONGER THAN 7 DAYS* 11/08/18   [provider]  folic acid (FOLVITE) 1 MG tablet Take 1 mg by mouth daily.    [provider]  HYDROcodone-acetaminophen (NORCO) 7.5-325 MG tablet Take 1 tablet by mouth every 6 (six) hours as needed. 04/25/19   [provider]  hydrocortisone 2.5 % lotion  12/23/18   [provider]  JANUVIA 100 MG tablet Take 100 mg by mouth daily. 02/24/18   [provider]  LEVEMIR FLEXTOUCH 100 UNIT/ML Pen Inject 75 Units into the skin 2 (two) times daily.  07/28/14   [provider]  losartan-hydrochlorothiazide (HYZAAR) 100-25 MG tablet Take 1 tablet by mouth daily. 12/20/17   [provider]  Cira Servant  FLEXPEN 100 UNIT/ML FlexPen Inject 30 Units into the skin 3 (three) times daily with meals.  07/28/14   [provider]  ONETOUCH VERIO test strip  11/26/18   [provider]  warfarin (COUMADIN) 5 MG tablet Take 1 tablet (5 mg total) by mouth daily at 6 PM. 09/11/16 09/12/16  Wendie Agreste, MD    Allergies    Penicillins  Review of Systems   Review of Systems  Constitutional: Negative for fever.  HENT: Negative for congestion.   Respiratory: Negative for cough and shortness of breath.   Gastrointestinal: Negative for diarrhea, nausea and vomiting.  Musculoskeletal: Positive  for arthralgias and gait problem. Negative for neck pain and neck stiffness.  Skin: Negative for color change and rash.    Physical Exam Updated Vital Signs BP (!) 144/86 (BP Location: Right Arm)   Pulse (!) 122   Temp 100.1 F (37.8 C) (Oral)   Resp 20   Ht 5' 4.5" (1.638 m)   Wt (!) 158.8 kg   SpO2 100%   BMI 59.15 kg/m   Physical Exam Vitals and nursing note reviewed.  Constitutional:      General: She is not in acute distress.    Appearance: Normal appearance. She is obese. She is not ill-appearing, toxic-appearing or diaphoretic.  HENT:     Head: Normocephalic.  Eyes:     Conjunctiva/sclera: Conjunctivae normal.  Pulmonary:     Effort: Pulmonary effort is normal.  Musculoskeletal:     Comments: Normal distal pulses, sensation.  There is no skin changes or signs of trauma.  The right foot has tenderness to palpation over the mid foot medially.  She has pain with both dorsi flexion and plantar flexion with decreased range of motion secondary to pain.  No obvious swelling.  She has mild tenderness to palpation over the lateral right ankle   Skin:    General: Skin is dry.     Capillary Refill: Capillary refill takes less than 2 seconds.  Neurological:     General: No focal deficit present.     Mental Status: She is alert.     Gait: Gait abnormal.     Deep Tendon Reflexes: Reflexes normal.  Psychiatric:        Mood and Affect: Mood normal.     ED Results / Procedures / Treatments   Labs (all labs ordered are listed, but only abnormal results are displayed) Labs Reviewed - No data to display  EKG None  Radiology No results found.  Procedures Procedures (including critical care time)  Medications Ordered in ED Medications  HYDROcodone-acetaminophen (NORCO/VICODIN) 5-325 MG per tablet 1 tablet (has no administration in time range)    ED Course  I have reviewed the triage vital signs and the nursing notes.  Pertinent labs & imaging results that were  available during my care of the patient were reviewed by me and considered in my medical decision making (see chart for details).  Clinical Course as of Jun 26 1047  Wed Jun 25, 2019  1702 Patient presenting with complaints of right foot pain after a fall yesterday.  Patient had negative foot and ankle x-rays of the right side.  When I went to reevaluate the patient she tells me that now her left foot is hurting.  She reports she also thinks that she injured this foot as well.  No bony tenderness on my exam and she has good range of motion but reports pain with palpation.  We will  also image this foot as well and apply Ace wrap.   [KM]  [id number] Left foot x-ray negative.  Advised rest, ice, compression and elevation.  She is already seeing podiatry Dr. Earleen Newport.  Reports she has follow-up with him tomorrow.   [KM]    Clinical Course User Index [KM] Kristine Royal   MDM Rules/Calculators/A&P                      Based on review of vitals, medical screening exam, lab work and/or imaging, there does not appear to be an acute, emergent etiology for the patient's symptoms. Counseled pt on good return precautions and encouraged both PCP and ED follow-up as needed.  Prior to discharge, I also discussed incidental imaging findings with patient in detail and advised appropriate, recommended follow-up in detail.  Clinical Impression: 1. Sprain of right foot, initial encounter   2. Sprain of left foot, initial encounter   3. Fall, initial encounter     Disposition: Discharge  Prior to providing a prescription for a controlled substance, I independently reviewed the patient's recent prescription history on the [place]. The patient had no recent or regular prescriptions and was deemed appropriate for a brief, less than 3 day prescription of narcotic for acute analgesia.  This note was prepared with assistance of Systems analyst.  Occasional wrong-word or sound-a-like substitutions may have occurred due to the inherent limitations of voice recognition software.  Final Clinical Impression(s) / ED Diagnoses Final diagnoses:  None    Rx / DC Orders ED Discharge Orders    None       Kristine Royal 06/26/19 1049    Wyvonnia Dusky, MD 06/26/19 1255

## 2019-06-25 NOTE — Discharge Instructions (Addendum)
Thank you for allowing me to care for you today. Please return to the emergency department if you have new or worsening symptoms. Take your medications as instructed.  ° °

## 2019-06-25 NOTE — ED Triage Notes (Signed)
Walking with walker yesterday and sts she had to lift it up to get into the bathroom and somehow she fell.  Pain to top of right foot. Pt describes an excessive plantar flexion.

## 2019-06-26 ENCOUNTER — Ambulatory Visit: Payer: BC Managed Care – PPO | Admitting: Podiatry

## 2019-06-27 ENCOUNTER — Ambulatory Visit: Payer: BC Managed Care – PPO | Admitting: Podiatry

## 2019-06-27 ENCOUNTER — Encounter: Payer: Self-pay | Admitting: Podiatry

## 2019-06-27 ENCOUNTER — Other Ambulatory Visit: Payer: Self-pay

## 2019-06-27 ENCOUNTER — Ambulatory Visit: Payer: BC Managed Care – PPO

## 2019-06-27 DIAGNOSIS — S93609D Unspecified sprain of unspecified foot, subsequent encounter: Secondary | ICD-10-CM

## 2019-06-27 DIAGNOSIS — S93504D Unspecified sprain of right lesser toe(s), subsequent encounter: Secondary | ICD-10-CM

## 2019-06-27 DIAGNOSIS — S93609A Unspecified sprain of unspecified foot, initial encounter: Secondary | ICD-10-CM | POA: Insufficient documentation

## 2019-06-27 NOTE — Progress Notes (Signed)
This patient presents to the office for evaluation of her painful right foot and ankle.  Patient says she cannot bear weight on her right foot due to pain.     she also has mild pain in her left foot.  She states she has experienced frequent falls since December starting due to knee treatment.  She was seen by Dr.  Jacqualyn Posey on 06/02/19 who diagnosed her with liz-frank injury right foot.   She was told to not to return to work and use an ace bandage for compression and cam walker for immobilization.  She was unable to utilize the cam walker as recommended by Dr. Jacqualyn Posey.  She said she had another fall this week causing her right foot to hyperflex.  She was seen in the ED  on Wednesday  night where x-rays were taken and she was dispensed medicine for pain.  She was told to se Dr.  Jacqualyn Posey on Thursday but the clinic was closed.  She presents to the office today for evaluation. and treatment.  She says her right foot is painful still preventing her from bearing weight.  She says her whole foot hurts but especially at top of right midfoot.  She has significant swelling right foot.  She presents to the office in a wheelchair with a female guardian.  Neurovascular status intact.  Evaluation of her left foot does reveal swelling over the dorsum of the midfoot and the left ankle.  Patient has no palpable pain noted along the course of the posterior tibial tendon or the peroneal tendon.  Mild discomfort noted over the Lisfranc's joint left foot.  Normal temperature is noted on this left foot.  Examination of the right foot reveals significant swelling on the dorsum of the midfoot and right ankle.  There is generalized swelling with increased temperature noted in the right foot/ankle.  Patient has significant pain upon dorsiflexion plantarflexion of her right foot.  No palpable pain noted along the course of the posterior tibial tendon and the peroneal tendon right foot   .Foot Sprain right foot.  Foot Sprain left  foot.  ROV.  Examination of her previous notes from Dr. Jacqualyn Posey and her PA at the emergency department was performed.  Examination of her left foot does reveal mild discomfort but this is less painful than her right foot.  Her right foot is significantly painful at the level of the Lisfranc's joint with increased temperature and swelling noted.  I applied an Unna boot to reduce swelling and provide support for her right foot.  I told her she could now utilize the cam walker to tolerance.  I recommended  that she return in 1 week for removal of the [employer].  If the pain persists she should come in Monday and be evaluated and an MRI should be considered.  RTC 1 week per Dr.  Jacqualyn Posey.   Gardiner Barefoot DPM

## 2019-07-02 ENCOUNTER — Ambulatory Visit: Payer: BC Managed Care – PPO | Admitting: Podiatry

## 2019-07-02 ENCOUNTER — Other Ambulatory Visit: Payer: Self-pay

## 2019-07-02 DIAGNOSIS — S99921D Unspecified injury of right foot, subsequent encounter: Secondary | ICD-10-CM | POA: Diagnosis not present

## 2019-07-02 DIAGNOSIS — M778 Other enthesopathies, not elsewhere classified: Secondary | ICD-10-CM

## 2019-07-02 DIAGNOSIS — M779 Enthesopathy, unspecified: Secondary | ICD-10-CM

## 2019-07-02 DIAGNOSIS — S93621A Sprain of tarsometatarsal ligament of right foot, initial encounter: Secondary | ICD-10-CM

## 2019-07-04 ENCOUNTER — Other Ambulatory Visit: Payer: Self-pay | Admitting: Podiatry

## 2019-07-04 ENCOUNTER — Telehealth: Payer: Self-pay | Admitting: *Deleted

## 2019-07-04 DIAGNOSIS — R609 Edema, unspecified: Secondary | ICD-10-CM

## 2019-07-04 DIAGNOSIS — M79661 Pain in right lower leg: Secondary | ICD-10-CM

## 2019-07-04 DIAGNOSIS — M779 Enthesopathy, unspecified: Secondary | ICD-10-CM

## 2019-07-04 DIAGNOSIS — S93609D Unspecified sprain of unspecified foot, subsequent encounter: Secondary | ICD-10-CM

## 2019-07-04 DIAGNOSIS — S93621A Sprain of tarsometatarsal ligament of right foot, initial encounter: Secondary | ICD-10-CM

## 2019-07-04 DIAGNOSIS — M79671 Pain in right foot: Secondary | ICD-10-CM

## 2019-07-04 LAB — CBC WITH DIFFERENTIAL/PLATELET
Absolute Monocytes: 650 cells/uL (ref 200–950)
Basophils Absolute: 75 cells/uL (ref 0–200)
Basophils Relative: 0.6 %
Eosinophils Absolute: 125 cells/uL (ref 15–500)
Eosinophils Relative: 1 %
HCT: 33.2 % — ABNORMAL LOW (ref 35.0–45.0)
Hemoglobin: 10.7 g/dL — ABNORMAL LOW (ref 11.7–15.5)
Lymphs Abs: 1663 cells/uL (ref 850–3900)
MCH: 26.2 pg — ABNORMAL LOW (ref 27.0–33.0)
MCHC: 32.2 g/dL (ref 32.0–36.0)
MCV: 81.4 fL (ref 80.0–100.0)
MPV: 9.6 fL (ref 7.5–12.5)
Monocytes Relative: 5.2 %
Neutro Abs: 9988 cells/uL — ABNORMAL HIGH (ref 1500–7800)
Neutrophils Relative %: 79.9 %
Platelets: 544 10*3/uL — ABNORMAL HIGH (ref 140–400)
RBC: 4.08 10*6/uL (ref 3.80–5.10)
RDW: 14.4 % (ref 11.0–15.0)
Total Lymphocyte: 13.3 %
WBC: 12.5 10*3/uL — ABNORMAL HIGH (ref 3.8–10.8)

## 2019-07-04 LAB — COMPLETE METABOLIC PANEL WITH GFR
AG Ratio: 1.1 (calc) (ref 1.0–2.5)
ALT: 14 U/L (ref 6–29)
AST: 15 U/L (ref 10–35)
Albumin: 3.8 g/dL (ref 3.6–5.1)
Alkaline phosphatase (APISO): 70 U/L (ref 37–153)
BUN: 18 mg/dL (ref 7–25)
CO2: 32 mmol/L (ref 20–32)
Calcium: 9.8 mg/dL (ref 8.6–10.4)
Chloride: 98 mmol/L (ref 98–110)
Creat: 0.9 mg/dL (ref 0.50–1.05)
GFR, Est African American: 81 mL/min/{1.73_m2} (ref >=60)
GFR, Est Non African American: 70 mL/min/{1.73_m2} (ref >=60)
Globulin: 3.5 g/dL (calc) (ref 1.9–3.7)
Glucose, Bld: 115 mg/dL — ABNORMAL HIGH (ref 65–99)
Potassium: 4.3 mmol/L (ref 3.5–5.3)
Sodium: 140 mmol/L (ref 135–146)
Total Bilirubin: 0.3 mg/dL (ref 0.2–1.2)
Total Protein: 7.3 g/dL (ref 6.1–8.1)

## 2019-07-04 LAB — SEDIMENTATION RATE: Sed Rate: 89 mm/h — ABNORMAL HIGH (ref 0–30)

## 2019-07-04 LAB — C-REACTIVE PROTEIN: CRP: 22.9 mg/L — ABNORMAL HIGH (ref <8.0)

## 2019-07-04 LAB — URIC ACID: Uric Acid, Serum: 9.4 mg/dL — ABNORMAL HIGH (ref 2.5–7.0)

## 2019-07-04 MED ORDER — COLCHICINE 0.6 MG PO TABS: 0.6000 mg | ORAL_TABLET | Freq: Every day | ORAL | 0 refills | Status: DC

## 2019-07-04 NOTE — Telephone Encounter (Signed)
Faxed orders for venous doppler to [facility]. Faxed orders for MRI to [facility], CMA for pre-cert and to [facility].

## 2019-07-04 NOTE — Progress Notes (Signed)
I called patient to go over blood work. Uric acid level is increased as well as WBC, ESR, CRP. Will start colchicine and will forward to her PCP. Clinically she does not have infection. This started after a fall at work and I think gout may be secondary.

## 2019-07-04 NOTE — Telephone Encounter (Signed)
-----   Message from Trula Slade, DPM sent at 07/03/2019 11:54 AM EST ----- Can you order venous duplex to rule out DVT? She has leg pain and is off of her coumadin  Also can you order an MRI of the right foot due to injury. 2 sets of x-rays are negative and continued midfoot pain; evaluate lisfranc sprain
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
## 2019-07-10 ENCOUNTER — Other Ambulatory Visit: Payer: Self-pay

## 2019-07-10 ENCOUNTER — Encounter: Payer: Self-pay | Admitting: Podiatry

## 2019-07-10 ENCOUNTER — Ambulatory Visit (INDEPENDENT_AMBULATORY_CARE_PROVIDER_SITE_OTHER): Payer: BC Managed Care – PPO | Admitting: Podiatry

## 2019-07-10 ENCOUNTER — Ambulatory Visit: Payer: BC Managed Care – PPO | Admitting: Podiatry

## 2019-07-10 VITALS — Temp 97.1°F

## 2019-07-10 DIAGNOSIS — S93621D Sprain of tarsometatarsal ligament of right foot, subsequent encounter: Secondary | ICD-10-CM | POA: Diagnosis not present

## 2019-07-10 DIAGNOSIS — M779 Enthesopathy, unspecified: Secondary | ICD-10-CM

## 2019-07-10 DIAGNOSIS — S99912A Unspecified injury of left ankle, initial encounter: Secondary | ICD-10-CM | POA: Diagnosis not present

## 2019-07-14 ENCOUNTER — Telehealth: Payer: Self-pay | Admitting: Podiatry

## 2019-07-14 DIAGNOSIS — M79671 Pain in right foot: Secondary | ICD-10-CM

## 2019-07-14 DIAGNOSIS — M779 Enthesopathy, unspecified: Secondary | ICD-10-CM

## 2019-07-14 DIAGNOSIS — S93621A Sprain of tarsometatarsal ligament of right foot, initial encounter: Secondary | ICD-10-CM

## 2019-07-14 DIAGNOSIS — S93609D Unspecified sprain of unspecified foot, subsequent encounter: Secondary | ICD-10-CM

## 2019-07-14 NOTE — Telephone Encounter (Signed)
I spoke to pt and she states she had been waiting for the [facility] center to call her to get her in over the weekend. I told pt I had no indicator in Dr. Leigh Aurora orders that she was to be scheduled at the [facility] for a MRI, but if she wanted to change I would cancel the [facility] orders and send orders to [employer] and she could contact them with her employer's Worker's comp information. Pt stated no she wanted to go to the one that the paperwork would come back to Dr. Jacqualyn Posey. I told her [place], and she stated they could not get her in until the end of the month. I told pt they had a cancellation schedule and she could periodically check. Then pt stated she wanted to go to [facility] and asked if I knew they could get her in sooner and I told her she could try and I would send the orders there and she stated to hold off she would call [facility] to check if they have an earlier appt, and call back.

## 2019-07-14 NOTE — Telephone Encounter (Signed)
Faxed orders to [facility] with note the pt would call with billing information.

## 2019-07-14 NOTE — Telephone Encounter (Signed)
Pt states  Imaging does not have earlier appts send the MRI order to [employer]. I informed pt I would chang the order location and she could call [facility] [phone] with her employer information and schedule.

## 2019-07-14 NOTE — Addendum Note (Signed)
Addended by: Harriett Sine D on: 07/14/2019 04:42 PM   Modules accepted: Orders

## 2019-07-14 NOTE — Telephone Encounter (Signed)
My job is requesting a prescription for my MRI be sent to them. Dr. Jacqualyn Posey is trying to schedule me for one. Please give me a call at [phone]. Thank you. Bye.

## 2019-07-16 ENCOUNTER — Telehealth: Payer: Self-pay

## 2019-07-16 DIAGNOSIS — R609 Edema, unspecified: Secondary | ICD-10-CM

## 2019-07-16 DIAGNOSIS — M779 Enthesopathy, unspecified: Secondary | ICD-10-CM

## 2019-07-16 DIAGNOSIS — S93609D Unspecified sprain of unspecified foot, subsequent encounter: Secondary | ICD-10-CM

## 2019-07-16 DIAGNOSIS — S93621A Sprain of tarsometatarsal ligament of right foot, initial encounter: Secondary | ICD-10-CM

## 2019-07-16 NOTE — Telephone Encounter (Signed)
Yes, if we can. I spoke to [place] about this last week. It is in for the right ankle/foot and if they approve the left I would like to do that as well. Thank you.

## 2019-07-16 NOTE — Telephone Encounter (Signed)
Janeece Fitting, Nurse Case Manager from [facility] called in regard to getting a copy of the MRI of pt ankle to send to the adjuster for billing information to be send to [place]. The pts visits are now considered worker comp. Per the pt the MRI should include both ankles but Chrys Racer from the [facility] stated that it wasn't, only the right ankle. [facility] needs clarification on the MRI. Please call Janeece Fitting at [phone] of fax the MRI to [phone].

## 2019-07-16 NOTE — Telephone Encounter (Signed)
Faxed the right ankle, left ankle and left foot MRI orders to [facility].

## 2019-07-17 ENCOUNTER — Telehealth: Payer: Self-pay | Admitting: *Deleted

## 2019-07-17 ENCOUNTER — Telehealth: Payer: Self-pay | Admitting: Podiatry

## 2019-07-17 NOTE — Telephone Encounter (Signed)
Left message informing Aubrey, MRI right foot had been sent to the [facility] with the correct location and was to be pre-certed as Worker's comp and to contact [employer] at the [place].

## 2019-07-17 NOTE — Telephone Encounter (Signed)
Case manager - Silvana Newness states she needs the MRI orders for the right foot, has for the right ankle, left ankle and foot. I faxed a copy of all of the MRIs with a note stating to pre-cert the MRIs under [place]:  [id number] as requested by [facility].

## 2019-07-17 NOTE — Telephone Encounter (Signed)
Called and spoke with Hoyle Sauer from [place] [facility] and the patient is getting the Left ankle and Left foot and the Right ankle and the Right foot done by the workman's comp and Hoyle Sauer stated that they were waiting on the workman's comp to call and see if it was ok to have it done at the med center or use one of the workman's comp facility and Hoyle Sauer would call the patient to let her know. Lattie Haw

## 2019-07-17 NOTE — Telephone Encounter (Signed)
Patient is having a MRI on Saturday/ patients authorizations does not have the correct facility name. Needs be be correct asap.

## 2019-07-19 ENCOUNTER — Ambulatory Visit (HOSPITAL_BASED_OUTPATIENT_CLINIC_OR_DEPARTMENT_OTHER): Payer: BC Managed Care – PPO

## 2019-07-24 ENCOUNTER — Ambulatory Visit: Payer: BC Managed Care – PPO | Admitting: Podiatry

## 2019-07-24 ENCOUNTER — Telehealth: Payer: Self-pay | Admitting: *Deleted

## 2019-07-24 NOTE — Telephone Encounter (Signed)
-----   Message from Trula Slade, DPM sent at 07/24/2019  7:50 AM EDT ----- Any update on her MRIs??

## 2019-07-24 NOTE — Progress Notes (Signed)
Subjective: 59 year old female presents the office today for follow-up evaluation of right foot ankle pain.  Also she states that she has been having left ankle pain.  This started after she fell at work.  I did do blood work which did reveal increased uric acid and she started colchicine and she thinks that she has been getting somewhat better with this but she still has ankle pain.  Local dosing has helped some of the swelling.  No recent injuries.  She having difficulty put weight on her foot and ambulating. Denies any systemic complaints such as fevers, chills, nausea, vomiting. No acute changes since last appointment, and no other complaints at this time.   Objective: AAO x3, NAD DP/PT pulses palpable bilaterally, CRT less than 3 seconds There is diffuse tenderness of the right foot and not well to the lateral aspect of the ankle.  There is no area of pinpoint tenderness.  Flexor, extensor tendons appear to be intact.  Decreased edema.  Is no erythema.  Also global tenderness of the left ankle.  No pain in the Achilles tendons. No open lesions or pre-ulcerative lesions.  No pain with calf compression, swelling, warmth, erythema  Assessment: Right foot ankle, left ankle pain status post fall; gout  Plan: -All treatment options discussed with the patient including all alternatives, risks, complications.  -Morning continue mobilization on the right side.  Do think some of her left ankle pain is compensation but it did start after the fall.  Awaiting MRIs.  Finish course of colchicine.  Continue to ice elevate. -She is not very ambulatory and given her weight monitor for DVT, PE. -Patient encouraged to call the office with any questions, concerns, change in symptoms.   Trula Slade DPM

## 2019-07-24 NOTE — Telephone Encounter (Signed)
Faxed over the medical order today to [address] [facility] and the fax number [phone]. Lattie Haw

## 2019-07-30 ENCOUNTER — Telehealth: Payer: Self-pay | Admitting: *Deleted

## 2019-07-30 NOTE — Telephone Encounter (Signed)
-----   Message from Trula Slade, DPM sent at 07/24/2019  7:50 AM EDT ----- Any update on her MRIs??

## 2019-07-30 NOTE — Telephone Encounter (Signed)
I tried to call the patient today and the voice mail is full and can not take any new messages. Natasha Rollins

## 2019-08-05 ENCOUNTER — Other Ambulatory Visit: Payer: BC Managed Care – PPO

## 2019-08-05 ENCOUNTER — Telehealth: Payer: Self-pay | Admitting: *Deleted

## 2019-08-05 NOTE — Telephone Encounter (Signed)
Dr Jacqualyn Posey got the results yesterday morning. Natasha Rollins

## 2019-08-05 NOTE — Telephone Encounter (Signed)
-----   Message from Trula Slade, DPM sent at 07/24/2019  7:50 AM EDT ----- Any update on her MRIs??

## 2019-08-13 ENCOUNTER — Telehealth: Payer: Self-pay | Admitting: *Deleted

## 2019-08-13 ENCOUNTER — Telehealth: Payer: Self-pay | Admitting: Podiatry

## 2019-08-13 ENCOUNTER — Other Ambulatory Visit: Payer: Self-pay | Admitting: Podiatry

## 2019-08-13 DIAGNOSIS — L02419 Cutaneous abscess of limb, unspecified: Secondary | ICD-10-CM

## 2019-08-13 DIAGNOSIS — M10071 Idiopathic gout, right ankle and foot: Secondary | ICD-10-CM

## 2019-08-13 NOTE — Telephone Encounter (Signed)
Faxed referral to [facility] for abscess to arms.

## 2019-08-13 NOTE — Telephone Encounter (Signed)
-----  Message from Trula Slade, DPM sent at 08/13/2019  2:05 PM EDT ----- Can you please put in for a general surgery consult [facility]) for recurrent abscess in her arm?   Also I have ordered blood work for her [place] as I am sure they will call you. It was for a CBC, ESR, CRP, BMP  Estill Bamberg- can you call her to schedule an appointment for follow up to discuss MRI and repeat x-ray?  Thanks!

## 2019-08-13 NOTE — Telephone Encounter (Signed)
Attempted to call patient to go over MRI results. Left VM to call back.

## 2019-08-13 NOTE — Telephone Encounter (Signed)
I informed pt of Dr. Leigh Aurora referral to [employer] lab work orders. Pt states [facility] was terrible and would like to go to an alternate lab. I informed pt [facility] was on [address] and I would change the orders.

## 2019-08-26 ENCOUNTER — Other Ambulatory Visit: Payer: Self-pay | Admitting: Podiatry

## 2019-08-28 ENCOUNTER — Telehealth: Payer: Self-pay | Admitting: *Deleted

## 2019-08-28 NOTE — Telephone Encounter (Signed)
Can you ask if she needs this?

## 2019-08-28 NOTE — Telephone Encounter (Signed)
Pt states she has sharp throbbing pain, denies redness, and swelling, does have weakness.

## 2019-08-28 NOTE — Telephone Encounter (Signed)
I spoke with pt and asked if she had the MRIs performed. Pt states she did but could not remember where. I told pt if she had at a [facility] facility or [facility] we would have the results. Pt states she had the MRIs at the facility on [facility]. I told pt I would call [facility] and request the MRIs and transfer her to schedulers to get an appt, then message Dr. Jacqualyn Posey concerning the colchicine. Pt states she is not able to walk at this time.

## 2019-08-28 NOTE — Telephone Encounter (Signed)
Pt states she really needs the gout medication.

## 2019-08-28 NOTE — Telephone Encounter (Signed)
I spoke with Dr. Jacqualyn Posey and he stated he had discussed the MRIs with pt and she was to make an appt. Pt states she doesn't remember. I asked pt to describe the problem she was having with her feet. Pt states she doesn't know she doesn't feel good today. I asked pt if it was her sugar and she stated no. I asked pt to describe the problem with her feet. Pt states her feet just hurt. I asked pt to describe the pain was it dull, sharp, throbbing. Pt states sharp throbbing and weak, denied redness or swelling. I told pt I would have schedulers call to set appt and I would inform Dr. Jacqualyn Posey of her pain.

## 2019-08-28 NOTE — Telephone Encounter (Signed)
I had called her to review the MRI.   Is she having a gout flare? She needs to come in to be seen. If she is having a flare of gout it is OK to go ahead and refill but I would like to see her.

## 2019-08-28 NOTE — Telephone Encounter (Signed)
I called [facility] transferred me to [facility] for the [facility] facility and she states she will fax the MRIs to [phone].

## 2019-08-29 NOTE — Telephone Encounter (Signed)
Please have her hold off on PT until I see her on Thursday.

## 2019-08-29 NOTE — Telephone Encounter (Signed)
I informed pt of Dr. Leigh Aurora orders to hold off on PT.

## 2019-08-29 NOTE — Telephone Encounter (Signed)
I'm having problems with my feet. They are hurting and I cannot walk. I've been sick ever since Tuesday when my feet started hurting. I haven't been able to eat or drink anything and I'm on coumadin and diabetes medicine and I haven't been able to take my medication at all. Can someone please give me a call at [phone]. Thank you. Bye.

## 2019-08-29 NOTE — Telephone Encounter (Signed)
I informed pt of Dr. Leigh Aurora statement and I recommended pt contact her PCP for her other symptoms. Pt states she has an appt on Thursday with Dr. Jacqualyn Posey, and I told her to keep it. Pt states she has been having problems with pain in PT.

## 2019-08-29 NOTE — Telephone Encounter (Signed)
It does not seem to be a gout flare and not sure that colchicine would be helpful. Can you see if she is taking anyting right now? It is difficult with the coumadin and diabetes. When I spoke to her about the MRI I also wanted to get her blood work rechecked. The order was placed. Can you have her get that done before her appointment? Thanks.

## 2019-09-03 NOTE — Telephone Encounter (Signed)
Will review with her tomorrow at her appointment

## 2019-09-04 ENCOUNTER — Encounter: Payer: Self-pay | Admitting: Podiatry

## 2019-09-04 ENCOUNTER — Other Ambulatory Visit: Payer: Self-pay

## 2019-09-04 ENCOUNTER — Ambulatory Visit: Payer: BC Managed Care – PPO | Admitting: Podiatry

## 2019-09-04 ENCOUNTER — Other Ambulatory Visit: Payer: Self-pay | Admitting: Podiatry

## 2019-09-04 ENCOUNTER — Ambulatory Visit (INDEPENDENT_AMBULATORY_CARE_PROVIDER_SITE_OTHER): Payer: BC Managed Care – PPO

## 2019-09-04 DIAGNOSIS — M778 Other enthesopathies, not elsewhere classified: Secondary | ICD-10-CM

## 2019-09-04 DIAGNOSIS — M79673 Pain in unspecified foot: Secondary | ICD-10-CM

## 2019-09-04 DIAGNOSIS — G8929 Other chronic pain: Secondary | ICD-10-CM

## 2019-09-04 DIAGNOSIS — M10071 Idiopathic gout, right ankle and foot: Secondary | ICD-10-CM | POA: Diagnosis not present

## 2019-09-04 DIAGNOSIS — S93621D Sprain of tarsometatarsal ligament of right foot, subsequent encounter: Secondary | ICD-10-CM | POA: Diagnosis not present

## 2019-09-04 DIAGNOSIS — M779 Enthesopathy, unspecified: Secondary | ICD-10-CM | POA: Diagnosis not present

## 2019-09-04 LAB — CBC WITH DIFFERENTIAL/PLATELET
Basophils Absolute: 0.1 10*3/uL (ref 0.0–0.2)
Basos: 1 %
EOS (ABSOLUTE): 0.1 10*3/uL (ref 0.0–0.4)
Eos: 1 %
Hematocrit: 35.8 % (ref 34.0–46.6)
Hemoglobin: 11.4 g/dL (ref 11.1–15.9)
Immature Grans (Abs): 0.1 10*3/uL (ref 0.0–0.1)
Immature Granulocytes: 1 %
Lymphocytes Absolute: 1.7 10*3/uL (ref 0.7–3.1)
Lymphs: 12 %
MCH: 26.3 pg — ABNORMAL LOW (ref 26.6–33.0)
MCHC: 31.8 g/dL (ref 31.5–35.7)
MCV: 83 fL (ref 79–97)
Monocytes Absolute: 0.9 10*3/uL (ref 0.1–0.9)
Monocytes: 6 %
Neutrophils Absolute: 11.5 10*3/uL — ABNORMAL HIGH (ref 1.4–7.0)
Neutrophils: 79 %
Platelets: 575 10*3/uL — ABNORMAL HIGH (ref 150–450)
RBC: 4.33 x10E6/uL (ref 3.77–5.28)
RDW: 14.8 % (ref 11.7–15.4)
WBC: 14.4 10*3/uL — ABNORMAL HIGH (ref 3.4–10.8)

## 2019-09-04 LAB — BASIC METABOLIC PANEL
BUN/Creatinine Ratio: 22 (ref 9–23)
BUN: 22 mg/dL (ref 6–24)
CO2: 26 mmol/L (ref 20–29)
Calcium: 9.8 mg/dL (ref 8.7–10.2)
Chloride: 97 mmol/L (ref 96–106)
Creatinine, Ser: 1.02 mg/dL — ABNORMAL HIGH (ref 0.57–1.00)
GFR calc Af Amer: 70 mL/min/{1.73_m2} (ref >59)
GFR calc non Af Amer: 60 mL/min/{1.73_m2} (ref >59)
Glucose: 100 mg/dL — ABNORMAL HIGH (ref 65–99)
Potassium: 4.7 mmol/L (ref 3.5–5.2)
Sodium: 139 mmol/L (ref 134–144)

## 2019-09-04 LAB — C-REACTIVE PROTEIN: CRP: 38 mg/L — ABNORMAL HIGH (ref 0–10)

## 2019-09-04 LAB — SEDIMENTATION RATE: Sed Rate: 9 mm/hr (ref 0–40)

## 2019-09-05 ENCOUNTER — Telehealth: Payer: Self-pay | Admitting: *Deleted

## 2019-09-05 LAB — URIC ACID: Uric Acid: 8.6 mg/dL — ABNORMAL HIGH (ref 3.0–7.2)

## 2019-09-05 MED ORDER — ALLOPURINOL 100 MG PO TABS: 100.0000 mg | ORAL_TABLET | Freq: Every day | ORAL | 6 refills | Status: DC

## 2019-09-05 NOTE — Progress Notes (Signed)
Subjective: 59 year old female presents the office today for follow-up evaluation of the left foot pain.  She states that since I last saw her she has not been able to do much walking.  She has recently started to walk with a walker.  She has been having bilateral foot pain.  She did start physical therapy and she was doing great she felt much better but the next day she was having pain.  This did dissipate.  She states that currently she is not having significant pain on the left side but still pain on the right side.  She is also concerned about a dark discoloration to both her feet.  She has noted some swelling but this is also improved. Denies any systemic complaints such as fevers, chills, nausea, vomiting. No acute changes since last appointment, and no other complaints at this time.   Objective: AAO x3, NAD DP/PT pulses palpable bilaterally, CRT less than 3 seconds There is mild swelling to bilateral lower extremities and some darkened discoloration but she has good perfusion to her foot and there is normal temperature gradient.  I think that the discoloration is coming from the swelling.  On the left foot there is no significant area of pinpoint tenderness particularly there is no pain along the metatarsals.  There is no pain on the flexor, extensor tendons.  On the right foot there is still mild discomfort on the dorsal midfoot.  No area of pinpoint tenderness identified.  She presents today in a wheelchair.  There is no pain to the ankles currently. No open lesions or pre-ulcerative lesions.  No pain with calf compression, swelling, warmth, erythema  Assessment: 59 year old female with bilateral chronic foot pain  Plan: -All treatment options discussed with the patient including all alternatives, risks, complications.  -Repeat x-rays of the foot obtained bilaterally.  There is no evidence of acute fracture identified. -I want her to continue physical therapy.  She is going to be starting  again tomorrow.  I already continue to ice elevate her feet as well.  I did recheck her blood work.  White count is still elevated which is been chronically elevated.  She does get recurrent boils in her arm and chest.  She is following up with general surgery for this.  Discussed return to infectious disease.  We will recheck a uric acid level.  If still elevated will start allopurinol 100 mg daily. -Patient encouraged to call the office with any questions, concerns, change in symptoms.   Trula Slade DPM

## 2019-09-05 NOTE — Telephone Encounter (Signed)
-----   Message from Trula Slade, DPM sent at 09/05/2019  7:51 AM EDT ----- Tivis Ringer- can you let her know that the uric acid level is still high. Can you send in allopurinol 100mg  daily for her? Thanks.

## 2019-09-05 NOTE — Telephone Encounter (Signed)
Left message informing pt the labs were still high and Dr. Jacqualyn Posey had sent in a medication for this.

## 2019-09-29 ENCOUNTER — Other Ambulatory Visit: Payer: Self-pay

## 2019-09-29 ENCOUNTER — Ambulatory Visit: Payer: BC Managed Care – PPO | Admitting: Podiatry

## 2019-09-29 ENCOUNTER — Encounter: Payer: Self-pay | Admitting: Podiatry

## 2019-09-29 DIAGNOSIS — G8929 Other chronic pain: Secondary | ICD-10-CM | POA: Diagnosis not present

## 2019-09-29 DIAGNOSIS — M79673 Pain in unspecified foot: Secondary | ICD-10-CM | POA: Diagnosis not present

## 2019-09-29 DIAGNOSIS — M779 Enthesopathy, unspecified: Secondary | ICD-10-CM | POA: Diagnosis not present

## 2019-09-29 DIAGNOSIS — M10071 Idiopathic gout, right ankle and foot: Secondary | ICD-10-CM

## 2019-09-30 NOTE — Progress Notes (Signed)
Subjective: 59 year old female presents the office today for follow-up evaluation of the left foot pain.  She states that she is doing significantly better.  She still having to use a walker to walk however she started physical therapy will be doing aquatic therapy.  Some discomfort in her feet but overall much improved.  She is also asking about gout which she gets started allopurinol.  Denies any systemic complaints such as fevers, chills, nausea, vomiting. No acute changes since last appointment, and no other complaints at this time.   Objective: AAO x3, NAD DP/PT pulses palpable bilaterally, CRT less than 3 seconds Edema bilaterally.  The discoloration she is concerned about is also improving.  There is no significant tenderness palpation of left foot.  Minimal diffuse tenderness on the right midfoot area but no specific area of pinpoint tenderness.  Flexor, extensor tendons appear to be intact.  No open lesions identified at this time. No pain with calf compression, swelling, warmth, erythema  Assessment: 59 year old female with bilateral chronic foot pain with improvement; gout  Plan: -All treatment options discussed with the patient including all alternatives, risks, complications.  -Overall she is doing better.  I want her to continue physical therapy she can be doing aquatic therapy.  She is making progress and she is not able to walk with a walker.  She still not able to return to work however.  She started allopurinol.  I will recheck blood work in 1 month to include CBC, BMP, uric acid.  Also encouraged to follow-up with her primary care physician in regards to her abnormal blood work.  She did see general surgery for the abscesses but when they saw her she had no active pathology.  Return in about 4 weeks (around 10/27/2019).  Trula Slade DPM

## 2019-10-03 LAB — URIC ACID: Uric Acid: 6.5 mg/dL (ref 3.0–7.2)

## 2019-10-08 ENCOUNTER — Other Ambulatory Visit: Payer: Self-pay | Admitting: Gastroenterology

## 2019-10-10 ENCOUNTER — Telehealth: Payer: Self-pay | Admitting: *Deleted

## 2019-10-10 NOTE — Telephone Encounter (Signed)
I asked [facility] [place] for labs requested 09/29/2019 CBB with Diff and BMP, and she states the specimens were not collected with their group.

## 2019-10-10 NOTE — Telephone Encounter (Signed)
I requested results from [facility] for CBC with Diff, BMP. I received requested labs of 10/02/2019 and routed message to Dr. Jacqualyn Posey.

## 2019-10-10 NOTE — Telephone Encounter (Signed)
-----   Message from Trula Slade, DPM sent at 10/08/2019  6:27 PM EDT ----- Val- Please let her know that the uric acid level is normal but I have not received the results from the BMP and the CBC if someone could follow up on that. Thanks.

## 2019-10-13 NOTE — Telephone Encounter (Signed)
Received it. WBC was still high at 11.8, creatine normal

## 2019-10-27 ENCOUNTER — Other Ambulatory Visit: Payer: Self-pay

## 2019-10-27 ENCOUNTER — Ambulatory Visit: Payer: BC Managed Care – PPO | Admitting: Podiatry

## 2019-10-27 ENCOUNTER — Encounter: Payer: Self-pay | Admitting: Podiatry

## 2019-10-27 DIAGNOSIS — G8929 Other chronic pain: Secondary | ICD-10-CM

## 2019-10-27 DIAGNOSIS — M79673 Pain in unspecified foot: Secondary | ICD-10-CM | POA: Diagnosis not present

## 2019-10-27 DIAGNOSIS — S93621A Sprain of tarsometatarsal ligament of right foot, initial encounter: Secondary | ICD-10-CM

## 2019-10-27 DIAGNOSIS — G629 Polyneuropathy, unspecified: Secondary | ICD-10-CM

## 2019-10-27 DIAGNOSIS — M779 Enthesopathy, unspecified: Secondary | ICD-10-CM | POA: Diagnosis not present

## 2019-10-27 DIAGNOSIS — S93621D Sprain of tarsometatarsal ligament of right foot, subsequent encounter: Secondary | ICD-10-CM | POA: Diagnosis not present

## 2019-10-27 DIAGNOSIS — M10071 Idiopathic gout, right ankle and foot: Secondary | ICD-10-CM

## 2019-10-27 MED ORDER — GABAPENTIN 300 MG PO CAPS: 300.0000 mg | ORAL_CAPSULE | Freq: Every day | ORAL | 0 refills | Status: DC

## 2019-11-02 NOTE — Progress Notes (Signed)
Subjective: 59 year old female presents the office today for follow-up evaluation of bilateral foot pain.  She states that she is about the same compared to last appointment.  She has done a couple sessions of aquatic therapy.  She states it feels good when she is doing over the next day she is sore.  She still is having difficulty walking.  She describing burning to her feet today as well.  She gets pain to both feet and she points more on the forefoot area.  She has no other concerns today.  She does not feel that she is able to go back to work in person but she has been working from home.  Denies any systemic complaints such as fevers, chills, nausea, vomiting. No acute changes since last appointment, and no other complaints at this time.   Objective: AAO x3, NAD DP/PT pulses palpable bilaterally, CRT less than 3 seconds Edema bilaterally but appears to be improved.  The majority discomfort is on the interspaces on both feet on the left third and right second interspace.  There is no area pinpoint tenderness identified.  There is mild diffuse tenderness on the right side but again there is no area pinpoint tenderness with pain to the flexor, extensor tendons.  There is no erythema or warmth. No pain with calf compression, swelling, warmth, erythema  Assessment: 59 year old female with bilateral chronic foot pain with improvement; resolving gout; concern for neuropathy  Plan: -All treatment options discussed with the patient including all alternatives, risks, complications.  -Uric acid levels improved.  We will continue allopurinol -Steroid injection performed bilaterally.  Skin was cleaned with alcohol and a mixture of 1 cc Kenalog 10, 0.5 cc of Marcaine plain, 0.5 cc lidocaine plain was infiltrated into the area of maximal tenderness bilaterally along the left third interspace and right second interspace.  Postinjection care discussed. -Office: Burning concern for neuropathy.  We will start  gabapentin 300 mg at nighttime and discussed side effects. -Continue physical therapy.  Return in about 4 weeks (around 11/24/2019).  Trula Slade DPM

## 2019-12-02 ENCOUNTER — Other Ambulatory Visit: Payer: Self-pay

## 2019-12-02 ENCOUNTER — Ambulatory Visit (INDEPENDENT_AMBULATORY_CARE_PROVIDER_SITE_OTHER): Payer: BC Managed Care – PPO

## 2019-12-02 ENCOUNTER — Ambulatory Visit: Payer: BC Managed Care – PPO | Admitting: Podiatry

## 2019-12-02 ENCOUNTER — Encounter: Payer: Self-pay | Admitting: Podiatry

## 2019-12-02 DIAGNOSIS — M779 Enthesopathy, unspecified: Secondary | ICD-10-CM

## 2019-12-02 DIAGNOSIS — M25571 Pain in right ankle and joints of right foot: Secondary | ICD-10-CM

## 2019-12-02 DIAGNOSIS — G8929 Other chronic pain: Secondary | ICD-10-CM

## 2019-12-02 DIAGNOSIS — M25572 Pain in left ankle and joints of left foot: Secondary | ICD-10-CM

## 2019-12-06 NOTE — Progress Notes (Signed)
Subjective: 59 year old female presents the office today for follow-up evaluation of bilateral foot/ankle pain.  She has been doing physical therapy and feels that this has been helping however she still begin the use of a walker or wheelchair.  She states that she feels that her feet are swelling at times.  She gets majority discomfort in the ankle and is asking for steroid injections today.  No recent injury or falls.  No redness.  She reports no open lesions. Denies any systemic complaints such as fevers, chills, nausea, vomiting. No acute changes since last appointment, and no other complaints at this time.   Objective: AAO x3, NAD DP/PT pulses palpable bilaterally, CRT less than 3 seconds Mild chronic bilateral lower extremity edema noted but there is no warmth or erythema associated this.  The majority discomfort today is on the right ankle joint bilaterally the right side worse than left.  There is no area pinpoint tenderness.  She does have some mild diffuse global tenderness but no specific area of tenderness identified today.  Flexor, extensor tendons appear to be intact. No pain with calf compression, swelling, warmth, erythema  Assessment: 59 year old female with bilateral chronic foot pain with improvement; ankle pain; still concern for neuropathy  Plan: -All treatment options discussed with the patient including all alternatives, risks, complications.  -Repeat x-rays were obtained and reviewed today.  No evidence of acute fracture identified. -Steroid injection performed to bilateral ankles.  See procedure note below. -Continue physical therapy -Next appointment if not making significant progress will likely do CT for MRI scans bilaterally.  Procedure: Injection intermediate joint Discussed alternatives, risks, complications and verbal consent was obtained.  Location: Bilateral ankle joint Skin Prep: Betadine Injectate: 0.5cc 0.5% marcaine plain, 0.5 cc 2% lidocaine plain and, 1  cc kenalog 10. Disposition: Patient tolerated procedure well. Injection site dressed with a band-aid.  Post-injection care was discussed and return precautions discussed.   Return in about 3 weeks (around 12/23/2019).  Trula Slade DPM

## 2019-12-16 ENCOUNTER — Telehealth: Payer: Self-pay | Admitting: Podiatry

## 2019-12-16 NOTE — Telephone Encounter (Signed)
Let's do 3 months of this if that is possible.

## 2019-12-16 NOTE — Telephone Encounter (Signed)
Patient said that she was working from home, but now her office has opened up, and she has to return to the office. Patient would like intermittent FMLA. How long is she approved for?

## 2019-12-25 ENCOUNTER — Other Ambulatory Visit: Payer: Self-pay

## 2019-12-25 ENCOUNTER — Ambulatory Visit: Payer: BC Managed Care – PPO | Admitting: Podiatry

## 2019-12-25 ENCOUNTER — Ambulatory Visit (HOSPITAL_COMMUNITY)
Admission: RE | Admit: 2019-12-25 | Discharge: 2019-12-25 | Disposition: A | Discharge status: Home / Self Care | Payer: BC Managed Care – PPO | Source: Ambulatory Visit | Attending: Vascular Surgery | Admitting: Vascular Surgery

## 2019-12-25 DIAGNOSIS — R609 Edema, unspecified: Secondary | ICD-10-CM | POA: Diagnosis not present

## 2019-12-25 DIAGNOSIS — S93621D Sprain of tarsometatarsal ligament of right foot, subsequent encounter: Secondary | ICD-10-CM

## 2019-12-25 DIAGNOSIS — M25572 Pain in left ankle and joints of left foot: Secondary | ICD-10-CM

## 2019-12-25 DIAGNOSIS — M25571 Pain in right ankle and joints of right foot: Secondary | ICD-10-CM

## 2019-12-25 DIAGNOSIS — M79673 Pain in unspecified foot: Secondary | ICD-10-CM | POA: Diagnosis not present

## 2019-12-25 DIAGNOSIS — G8929 Other chronic pain: Secondary | ICD-10-CM

## 2019-12-25 DIAGNOSIS — S93609D Unspecified sprain of unspecified foot, subsequent encounter: Secondary | ICD-10-CM

## 2019-12-28 NOTE — Progress Notes (Signed)
Subjective: 59 year old female presents the office today for follow-up evaluation of bilateral foot/ankle pain.  She states the injection did not help much last appointment.  She does report improved swelling to both legs however she still having discomfort mostly in the midfoot and ankle areas on both sides about right side worse than left.  She wants to proceed with having CT scans performed.  She is starting physical therapy and she does feel that physical helping however she did have a lapse in therapy and during that timeframe she just stayed in bed.  Now she is return to physical therapy.  Work on trying to transition and use a cane.  She has noticed some swelling to her right leg and she states that her INR level was below 2.  She also saw the partner's compensation doctor about 2 to 3 weeks ago recommended physical therapy as well as a nerve conduction test.  Nerve conduction test has been ordered but not yet performed.   Objective: AAO x3, NAD DP/PT pulses palpable bilaterally, CRT less than 3 seconds To the foot and ankle arch appears to be much improved compared to previously however on the right calf there is swelling but no erythema or warmth of the calf is supple. There is mild diffuse tenderness along the midfoot areas as well as the ankles bilaterally the right side worse than left.  MMT 5/5.  The majority tenderness appears to be along the Lisfranc joint on the right side from where she displayed originally. MMT 4/5 bilaterally.  There is no erythema or warmth there is no open lesions.  Assessment: 59 year old female with bilateral chronic foot pain with improvement; ankle pain; still concern for neuropathy; swelling  Plan: -All treatment options discussed with the patient including all alternatives, risks, complications.  -Given the swelling her INR was also low I recommend ultrasound of the right leg.  This was ordered for her today completed. -We will order CT scans bilaterally  given ongoing pain -Continue physical therapy -Nerve conduction test has been ordered through [employer].  Trula Slade DPM

## 2019-12-30 ENCOUNTER — Encounter (HOSPITAL_COMMUNITY): Payer: Self-pay

## 2019-12-30 ENCOUNTER — Ambulatory Visit (HOSPITAL_COMMUNITY): Admit: 2019-12-30 | Payer: BC Managed Care – PPO | Admitting: Gastroenterology

## 2019-12-30 SURGERY — COLONOSCOPY WITH PROPOFOL
Anesthesia: Monitor Anesthesia Care

## 2020-01-06 ENCOUNTER — Telehealth: Payer: Self-pay | Admitting: Podiatry

## 2020-01-06 NOTE — Telephone Encounter (Signed)
Please advise thanks Eriel Doyon

## 2020-01-06 NOTE — Telephone Encounter (Signed)
Needs clarification on CT Scan-ordered bilateral lower ext but that is not in their protocol.  Would like to clarify what you are most concerned with.  Stated they could do any listed below.  tib fib-shows knee to ankle  Ankle-some of tim fib & calcaneous Foot-some tib to distal to toes

## 2020-01-07 ENCOUNTER — Telehealth (INDEPENDENT_AMBULATORY_CARE_PROVIDER_SITE_OTHER): Payer: BC Managed Care – PPO | Admitting: Podiatry

## 2020-01-07 ENCOUNTER — Other Ambulatory Visit: Payer: Self-pay | Admitting: Podiatry

## 2020-01-07 DIAGNOSIS — M779 Enthesopathy, unspecified: Secondary | ICD-10-CM

## 2020-01-07 DIAGNOSIS — S93621D Sprain of tarsometatarsal ligament of right foot, subsequent encounter: Secondary | ICD-10-CM

## 2020-01-07 DIAGNOSIS — S92301D Fracture of unspecified metatarsal bone(s), right foot, subsequent encounter for fracture with routine healing: Secondary | ICD-10-CM

## 2020-01-07 DIAGNOSIS — S92302D Fracture of unspecified metatarsal bone(s), left foot, subsequent encounter for fracture with routine healing: Secondary | ICD-10-CM

## 2020-01-07 NOTE — Telephone Encounter (Signed)
I called and left a VM at that line stating I have updated the orders.

## 2020-01-07 NOTE — Telephone Encounter (Signed)
I have updated the orders. I ordered left and right foot based on those descriptions.

## 2020-01-07 NOTE — Telephone Encounter (Signed)
Miriam called from [employer]. Patient scheduled for this Friday. Technician needs clarification of orders. Examples: left foot, right foot, left ankle, right ankle. Return call number [phone], extension [phone]. This is Miriam's direct line.

## 2020-01-08 ENCOUNTER — Ambulatory Visit: Payer: BC Managed Care – PPO | Admitting: Podiatry

## 2020-01-09 ENCOUNTER — Inpatient Hospital Stay: Admission: RE | Admit: 2020-01-09 | Payer: BC Managed Care – PPO | Source: Ambulatory Visit

## 2020-01-09 ENCOUNTER — Other Ambulatory Visit: Payer: BC Managed Care – PPO

## 2020-01-09 ENCOUNTER — Telehealth: Payer: Self-pay | Admitting: Podiatry

## 2020-01-09 NOTE — Telephone Encounter (Signed)
I called for the peer to peer to provide additional information for CT left and right foot.   Authorization #[id number]

## 2020-01-15 ENCOUNTER — Ambulatory Visit: Payer: BC Managed Care – PPO | Admitting: Podiatry

## 2020-01-22 ENCOUNTER — Other Ambulatory Visit: Payer: Self-pay

## 2020-01-22 ENCOUNTER — Ambulatory Visit
Admission: RE | Admit: 2020-01-22 | Discharge: 2020-01-22 | Disposition: A | Discharge status: Home / Self Care | Payer: BC Managed Care – PPO | Source: Ambulatory Visit | Attending: Podiatry | Admitting: Podiatry

## 2020-01-22 DIAGNOSIS — S92301D Fracture of unspecified metatarsal bone(s), right foot, subsequent encounter for fracture with routine healing: Secondary | ICD-10-CM

## 2020-01-22 DIAGNOSIS — S93621D Sprain of tarsometatarsal ligament of right foot, subsequent encounter: Secondary | ICD-10-CM

## 2020-01-22 DIAGNOSIS — S92302D Fracture of unspecified metatarsal bone(s), left foot, subsequent encounter for fracture with routine healing: Secondary | ICD-10-CM

## 2020-01-27 ENCOUNTER — Ambulatory Visit (INDEPENDENT_AMBULATORY_CARE_PROVIDER_SITE_OTHER): Payer: BC Managed Care – PPO | Admitting: Podiatry

## 2020-01-27 ENCOUNTER — Other Ambulatory Visit: Payer: Self-pay

## 2020-01-27 DIAGNOSIS — M25571 Pain in right ankle and joints of right foot: Secondary | ICD-10-CM | POA: Diagnosis not present

## 2020-01-27 DIAGNOSIS — M25572 Pain in left ankle and joints of left foot: Secondary | ICD-10-CM

## 2020-01-27 DIAGNOSIS — R609 Edema, unspecified: Secondary | ICD-10-CM | POA: Diagnosis not present

## 2020-01-27 DIAGNOSIS — S93621D Sprain of tarsometatarsal ligament of right foot, subsequent encounter: Secondary | ICD-10-CM | POA: Diagnosis not present

## 2020-01-27 DIAGNOSIS — G8929 Other chronic pain: Secondary | ICD-10-CM

## 2020-01-28 NOTE — Progress Notes (Signed)
Subjective: 59 year old female presents the office today for follow-up evaluation of bilateral foot/ankle pain.  She has been doing physical therapy with only doing aquatic therapy she is able to walk in the pool.  She has not been doing land therapy.  She still using a wheelchair to get around.  She did follow-up with Worker's Comp. doctor who had a nerve conduction test performed and there is a pinched nerve apparently in her back and she is being referred to orthopedics for this.  She has not yet seen them.  She is going back to work aspect intermittent disability until next August.  Objective: AAO x3, NAD DP/PT pulses palpable bilaterally, CRT less than 3 seconds No significant edema present bilaterally there is no specific tenderness.  Mild diffuse tenderness bilateral lower extremities.  There is no pain with ankle, subtalar range of motion.  Also, extensor tendons are intact.  No open lesions. No pain with calf compression, erythema or warmth  Assessment: 59 year old female with bilateral chronic foot pain with improvement; ankle pain; neuritis likely from back  Plan: -All treatment options discussed with the patient including all alternatives, risks, complications. -CT scans were reviewed. -My standpoint I will continue physical therapy and discuss transition to doing land therapy be more aggressive with therapy and hopefully to a walker.  She had a nerve conduction test and she is to follow-up for her back due to the pinched nerve affecting the right lower extremity. -We will extend intermittent FMLA.  Trula Slade DPM

## 2020-02-02 ENCOUNTER — Telehealth: Payer: Self-pay | Admitting: Podiatry

## 2020-02-02 NOTE — Telephone Encounter (Signed)
Called patient to let her know, intermittent  FMLA  forms are ready to be picked up.

## 2020-02-25 ENCOUNTER — Telehealth: Payer: Self-pay | Admitting: Podiatry

## 2020-02-25 NOTE — Telephone Encounter (Signed)
Received a copy of her back MRI. I called to follow up with the patient to see how she is doing and to see if she has seen orthopedics or back specialist. Left VM to call back

## 2020-03-02 ENCOUNTER — Ambulatory Visit: Payer: BC Managed Care – PPO | Admitting: Podiatry

## 2020-03-02 ENCOUNTER — Other Ambulatory Visit: Payer: Self-pay

## 2020-03-02 DIAGNOSIS — G8929 Other chronic pain: Secondary | ICD-10-CM | POA: Diagnosis not present

## 2020-03-02 DIAGNOSIS — S93621D Sprain of tarsometatarsal ligament of right foot, subsequent encounter: Secondary | ICD-10-CM | POA: Diagnosis not present

## 2020-03-02 DIAGNOSIS — R531 Weakness: Secondary | ICD-10-CM | POA: Diagnosis not present

## 2020-03-02 DIAGNOSIS — M25571 Pain in right ankle and joints of right foot: Secondary | ICD-10-CM | POA: Diagnosis not present

## 2020-03-02 DIAGNOSIS — M25572 Pain in left ankle and joints of left foot: Secondary | ICD-10-CM

## 2020-03-06 NOTE — Progress Notes (Signed)
Subjective: 59 year old female presents the office today for follow-up evaluation of bilateral foot/ankle pain.  Since I last saw her she had an MRI of her back which did show pathology in her back.  She is working to get approval through [employer] for injections in her back.  Seems to be appropriate limiting issue affecting her walking on her right side.  She is still doing aquatic therapy for her feet.  She is out on medical leave now given her medical conditions.  Objective: AAO x3, NAD DP/PT pulses palpable bilaterally, CRT less than 3 seconds Left foot standpoint the swelling appears to be much improved.  There is no erythema or warmth.  Mild diffuse tenderness along Lisfranc joint but no area of specific pinpoint tenderness.  Flexor, extensor tendons appear to be intact.  MMT 5/5. No pain with calf compression, erythema or warmth  Assessment: 59 year old female with bilateral chronic foot pain with improvement; ankle pain; nerve impingement  Plan: -All treatment options discussed with the patient including all alternatives, risks, complications. -At this point her feet have been improving.  She can continue with aquatic therapy.  She is awaiting authorization to [employer] for injections in her back.  I think this is what is limiting her walking at this point.  She is currently out of work on medical leave given her medical issues.  Trula Slade DPM

## 2020-03-20 ENCOUNTER — Other Ambulatory Visit: Payer: Self-pay | Admitting: Podiatry

## 2020-03-22 NOTE — Telephone Encounter (Signed)
Please advise 

## 2020-03-23 ENCOUNTER — Telehealth: Payer: Self-pay | Admitting: *Deleted

## 2020-03-23 DIAGNOSIS — M79641 Pain in right hand: Secondary | ICD-10-CM | POA: Insufficient documentation

## 2020-03-23 NOTE — Telephone Encounter (Signed)
[name] sent over a request for a refill of allopurinol (gout medicine) and I called the patient and the patient was still taken it and per Dr Jacqualyn Posey can stop taken the medicine and see how the patient does with out it and if any concerns or questions to call the office. Natasha Rollins

## 2020-03-30 ENCOUNTER — Ambulatory Visit: Payer: BC Managed Care – PPO | Admitting: Podiatry

## 2020-04-09 ENCOUNTER — Other Ambulatory Visit: Payer: Self-pay | Admitting: Physical Medicine and Rehabilitation

## 2020-04-09 ENCOUNTER — Other Ambulatory Visit: Payer: Self-pay | Admitting: Family Medicine

## 2020-04-09 DIAGNOSIS — M5416 Radiculopathy, lumbar region: Secondary | ICD-10-CM

## 2020-04-13 ENCOUNTER — Ambulatory Visit: Payer: BC Managed Care – PPO | Admitting: Podiatry

## 2020-04-15 ENCOUNTER — Other Ambulatory Visit: Payer: Self-pay | Admitting: Physical Medicine and Rehabilitation

## 2020-04-15 DIAGNOSIS — M545 Low back pain, unspecified: Secondary | ICD-10-CM

## 2020-04-15 DIAGNOSIS — M5416 Radiculopathy, lumbar region: Secondary | ICD-10-CM

## 2020-04-20 ENCOUNTER — Other Ambulatory Visit: Payer: BC Managed Care – PPO

## 2020-04-20 ENCOUNTER — Other Ambulatory Visit: Payer: Self-pay

## 2020-04-20 ENCOUNTER — Ambulatory Visit
Admission: RE | Admit: 2020-04-20 | Discharge: 2020-04-20 | Disposition: A | Discharge status: Home / Self Care | Payer: Self-pay | Source: Ambulatory Visit | Attending: Physical Medicine and Rehabilitation | Admitting: Physical Medicine and Rehabilitation

## 2020-04-20 DIAGNOSIS — M5416 Radiculopathy, lumbar region: Secondary | ICD-10-CM

## 2020-04-20 MED ORDER — IOPAMIDOL (ISOVUE-M 200) INJECTION 41%
1.0000 mL | Freq: Once | INTRAMUSCULAR | Status: AC
  Administered 2020-04-20: 13:00:00 1 mL via EPIDURAL

## 2020-04-20 MED ORDER — METHYLPREDNISOLONE ACETATE 40 MG/ML INJ SUSP (RADIOLOG
120.0000 mg | Freq: Once | INTRAMUSCULAR | Status: AC
  Administered 2020-04-20: 13:00:00 120 mg via EPIDURAL

## 2020-04-20 NOTE — Discharge Instructions (Signed)

## 2020-05-06 ENCOUNTER — Ambulatory Visit (INDEPENDENT_AMBULATORY_CARE_PROVIDER_SITE_OTHER): Payer: BC Managed Care – PPO | Admitting: Podiatry

## 2020-05-06 ENCOUNTER — Other Ambulatory Visit: Payer: Self-pay

## 2020-05-06 ENCOUNTER — Encounter: Payer: Self-pay | Admitting: Podiatry

## 2020-05-06 DIAGNOSIS — M779 Enthesopathy, unspecified: Secondary | ICD-10-CM

## 2020-05-06 DIAGNOSIS — M25572 Pain in left ankle and joints of left foot: Secondary | ICD-10-CM

## 2020-05-06 DIAGNOSIS — G8929 Other chronic pain: Secondary | ICD-10-CM

## 2020-05-06 DIAGNOSIS — M25571 Pain in right ankle and joints of right foot: Secondary | ICD-10-CM

## 2020-05-06 MED ORDER — TRIAMCINOLONE ACETONIDE 10 MG/ML IJ SUSP
10.0000 mg | Freq: Once | INTRAMUSCULAR | Status: AC
  Administered 2020-05-06: 16:00:00 10 mg
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
## 2020-06-14 ENCOUNTER — Ambulatory Visit: Payer: BC Managed Care – PPO | Admitting: Podiatry

## 2020-06-17 ENCOUNTER — Ambulatory Visit: Payer: BC Managed Care – PPO | Admitting: Podiatry

## 2020-06-28 ENCOUNTER — Ambulatory Visit (INDEPENDENT_AMBULATORY_CARE_PROVIDER_SITE_OTHER): Payer: BC Managed Care – PPO | Admitting: Podiatry

## 2020-06-28 ENCOUNTER — Other Ambulatory Visit: Payer: Self-pay

## 2020-06-28 DIAGNOSIS — M25571 Pain in right ankle and joints of right foot: Secondary | ICD-10-CM | POA: Diagnosis not present

## 2020-06-28 DIAGNOSIS — M779 Enthesopathy, unspecified: Secondary | ICD-10-CM

## 2020-06-28 DIAGNOSIS — M25572 Pain in left ankle and joints of left foot: Secondary | ICD-10-CM

## 2020-06-28 DIAGNOSIS — G8929 Other chronic pain: Secondary | ICD-10-CM

## 2020-06-28 DIAGNOSIS — S96911S Strain of unspecified muscle and tendon at ankle and foot level, right foot, sequela: Secondary | ICD-10-CM

## 2020-06-28 NOTE — Patient Instructions (Signed)
After MRI
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
## 2020-07-12 ENCOUNTER — Ambulatory Visit
Admission: RE | Admit: 2020-07-12 | Discharge: 2020-07-12 | Disposition: A | Discharge status: Home / Self Care | Payer: Worker's Compensation | Source: Ambulatory Visit | Attending: Physical Medicine and Rehabilitation | Admitting: Physical Medicine and Rehabilitation

## 2020-07-12 ENCOUNTER — Other Ambulatory Visit: Payer: Self-pay

## 2020-07-12 DIAGNOSIS — M545 Low back pain, unspecified: Secondary | ICD-10-CM

## 2020-07-12 DIAGNOSIS — M5416 Radiculopathy, lumbar region: Secondary | ICD-10-CM

## 2020-07-14 ENCOUNTER — Telehealth: Payer: Self-pay | Admitting: Podiatry

## 2020-07-14 NOTE — Telephone Encounter (Signed)
There was a notification sent, stating that the patient needed an xray before her MRI appointment on Sunday. I have called the patient twice to get her scheduled this week,

## 2020-07-14 NOTE — Telephone Encounter (Signed)
Ok thanks. Please have her come in anytime she can this week for the x-ray. It would be best if she came in today or tomorrow so it gives time to do the authorization.

## 2020-07-15 ENCOUNTER — Other Ambulatory Visit: Payer: BC Managed Care – PPO

## 2020-07-15 NOTE — Telephone Encounter (Signed)
Scheduled for tomorrow at 3:30, do you need me to send you a message after she is finish? For the dictation note?

## 2020-07-16 ENCOUNTER — Other Ambulatory Visit: Payer: Self-pay

## 2020-07-16 ENCOUNTER — Encounter: Payer: Self-pay | Admitting: Podiatry

## 2020-07-16 ENCOUNTER — Ambulatory Visit (INDEPENDENT_AMBULATORY_CARE_PROVIDER_SITE_OTHER): Payer: BC Managed Care – PPO

## 2020-07-16 DIAGNOSIS — S96911S Strain of unspecified muscle and tendon at ankle and foot level, right foot, sequela: Secondary | ICD-10-CM

## 2020-07-16 NOTE — Progress Notes (Signed)
3 views of the right ankle were obtained. There is no definitive evidence of acute fracture. There is some edema in the distal lateral malleolus. Flatfoot is present.   MRI pending.

## 2020-07-16 NOTE — Progress Notes (Signed)
X-Ray of the right ankle obtained at this time. Provider is aware.

## 2020-07-16 NOTE — Telephone Encounter (Signed)
X-ray note done

## 2020-07-18 ENCOUNTER — Other Ambulatory Visit: Payer: Self-pay

## 2020-07-18 ENCOUNTER — Ambulatory Visit
Admission: RE | Admit: 2020-07-18 | Discharge: 2020-07-18 | Disposition: A | Discharge status: Home / Self Care | Payer: BC Managed Care – PPO | Source: Ambulatory Visit | Attending: Podiatry | Admitting: Podiatry

## 2020-07-18 DIAGNOSIS — S96911S Strain of unspecified muscle and tendon at ankle and foot level, right foot, sequela: Secondary | ICD-10-CM

## 2020-08-03 ENCOUNTER — Telehealth: Payer: Self-pay | Admitting: Podiatry

## 2020-08-03 NOTE — Telephone Encounter (Signed)
LVM for patient to call and schedule an appointment with Dr. Jacqualyn Posey to go over the MRI results.

## 2020-08-26 ENCOUNTER — Ambulatory Visit: Payer: BC Managed Care – PPO | Admitting: Podiatry

## 2020-09-02 ENCOUNTER — Ambulatory Visit: Payer: BC Managed Care – PPO | Admitting: Podiatry

## 2020-09-02 ENCOUNTER — Other Ambulatory Visit: Payer: Self-pay

## 2020-09-02 DIAGNOSIS — G8929 Other chronic pain: Secondary | ICD-10-CM

## 2020-09-02 DIAGNOSIS — M779 Enthesopathy, unspecified: Secondary | ICD-10-CM

## 2020-09-02 DIAGNOSIS — S96911S Strain of unspecified muscle and tendon at ankle and foot level, right foot, sequela: Secondary | ICD-10-CM

## 2020-09-02 DIAGNOSIS — M25571 Pain in right ankle and joints of right foot: Secondary | ICD-10-CM

## 2020-09-02 DIAGNOSIS — M25572 Pain in left ankle and joints of left foot: Secondary | ICD-10-CM

## 2020-09-07 NOTE — Patient Instructions (Signed)
-  We will extend her FMLA for intermittent FMLA.  She is to continue with light duty.

## 2020-09-07 NOTE — Progress Notes (Signed)
Subjective: 60 year old female presents the office today for follow-up evaluation of bilateral foot/ankle pain.  She presents today for further evaluation.  She has been still doing physical therapy and she is also followed up with the [employer].  She still gets some discomfort of the foot with some mild improvement compared to last time I saw her.  She still not walking much and still uses a walker.  She does feel the strength of her ankle is improving.  Objective: AAO x3, NAD DP/PT pulses palpable bilaterally, CRT less than 3 seconds There is no significant swelling present bilaterally.  There is still tenderness palpation of the sinus tarsi bilaterally.  There is mild discomfort in the course the peroneal tendons today posterior and inferior to the lateral malleolus on the right side.  There is no pain or crepitation with joint range of motion. No area of pinpoint tenderness.  Flatfoot No pain with calf compression, erythema or warmth  Assessment: 60 year old female with bilateral chronic foot pain, peroneal tendon tear  Plan: -All treatment options discussed with the patient including all alternatives, risks, complications. -Reviewed the MRI with her.  There is new rupture present of the peroneal tendons as well as the ATFL.  I do think this is likely coming from when she had the injury on the past when she fell out of the wheelchair.  She has been still doing physical therapy she feels that she is getting improvement.  Discussed surgical intervention with her but she agrees that she does not want proceed with surgery given the risks of this.  Would use an ankle brace as needed.  Also since she has been doing physical therapy will continue with this.  Continue to follow-up with [employer] as well.   -We will complete FMLA paperwork for disability.  Continue light duty.  Trula Slade DPM

## 2020-09-29 ENCOUNTER — Other Ambulatory Visit: Payer: Self-pay | Admitting: Family Medicine

## 2020-09-29 ENCOUNTER — Other Ambulatory Visit: Payer: Self-pay | Admitting: Physical Medicine and Rehabilitation

## 2020-09-29 DIAGNOSIS — M5416 Radiculopathy, lumbar region: Secondary | ICD-10-CM

## 2020-10-08 ENCOUNTER — Other Ambulatory Visit: Payer: BC Managed Care – PPO

## 2020-10-12 ENCOUNTER — Other Ambulatory Visit: Payer: Self-pay

## 2020-10-12 ENCOUNTER — Ambulatory Visit
Admission: RE | Admit: 2020-10-12 | Discharge: 2020-10-12 | Disposition: A | Discharge status: Home / Self Care | Payer: Worker's Compensation | Source: Ambulatory Visit | Attending: Physical Medicine and Rehabilitation | Admitting: Physical Medicine and Rehabilitation

## 2020-10-12 DIAGNOSIS — M5416 Radiculopathy, lumbar region: Secondary | ICD-10-CM

## 2020-10-12 MED ORDER — METHYLPREDNISOLONE ACETATE 40 MG/ML INJ SUSP (RADIOLOG
80.0000 mg | Freq: Once | INTRAMUSCULAR | Status: AC
  Administered 2020-10-12: 80 mg via EPIDURAL

## 2020-10-12 MED ORDER — IOPAMIDOL (ISOVUE-M 200) INJECTION 41%
1.0000 mL | Freq: Once | INTRAMUSCULAR | Status: AC
  Administered 2020-10-12: 1 mL via EPIDURAL

## 2020-10-12 NOTE — Discharge Instructions (Signed)
Post Procedure Spinal Discharge Instruction Sheet  1. You may resume a regular diet and any medications that you routinely take (including pain medications).  2. No driving day of procedure.  3. Light activity throughout the rest of the day.  Do not do any strenuous work, exercise, bending or lifting.  The day following the procedure, you can resume normal physical activity but you should refrain from exercising or physical therapy for at least three days thereafter.   Common Side Effects:   Headaches- take your usual medications as directed by your physician.  Increase your fluid intake.  Caffeinated beverages may be helpful.  Lie flat in bed until your headache resolves.   Restlessness or inability to sleep- you may have trouble sleeping for the next few days.  Ask your referring physician if you need any medication for sleep.   Facial flushing or redness- should subside within a few days.   Increased pain- a temporary increase in pain a day or two following your procedure is not unusual.  Take your pain medication as prescribed by your referring physician.   Leg cramps  Please contact our office at [phone] for the following symptoms:  Fever greater than 100 degrees.  Headaches unresolved with medication after 2-3 days.  Increased swelling, pain, or redness at injection site.   Thank you for visiting [facility] today.   YOU MAY RESUME YOUR COUMADIN (WARFARIN) 12 HOURS AFTER INJECTION

## 2020-10-14 ENCOUNTER — Ambulatory Visit: Payer: BC Managed Care – PPO | Admitting: Podiatry

## 2020-10-14 ENCOUNTER — Encounter: Payer: Self-pay | Admitting: Podiatry

## 2020-10-14 ENCOUNTER — Other Ambulatory Visit: Payer: Self-pay

## 2020-10-14 DIAGNOSIS — G8929 Other chronic pain: Secondary | ICD-10-CM

## 2020-10-14 DIAGNOSIS — M25571 Pain in right ankle and joints of right foot: Secondary | ICD-10-CM | POA: Diagnosis not present

## 2020-10-14 DIAGNOSIS — M25572 Pain in left ankle and joints of left foot: Secondary | ICD-10-CM | POA: Diagnosis not present

## 2020-10-14 DIAGNOSIS — M779 Enthesopathy, unspecified: Secondary | ICD-10-CM | POA: Diagnosis not present

## 2020-10-14 DIAGNOSIS — S96911S Strain of unspecified muscle and tendon at ankle and foot level, right foot, sequela: Secondary | ICD-10-CM

## 2020-10-20 NOTE — Progress Notes (Signed)
Subjective: 60 year old female presents the office today for follow-up evaluation of bilateral foot/ankle pain.  She does feel that she is getting better with physical therapy.  She is been trying to incorporate more ligament therapy as opposed to just aquatic therapy.  She has discomfort afterwards but still having pain.  She feels that she also needs to be working from home for next couple months.  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  States that she had an injection in her back which did help her foot some.  Objective: AAO x3, NAD DP/PT pulses palpable bilaterally, CRT less than 3 seconds There is no significant swelling present bilaterally.  There is still mild tenderness palpation today along the course the peroneal tendons but appears to be improved today.  This is still posterior and inferior to the lateral malleolus.  No pain with ankle or subtalar joint range of motion.  There is mild decreased range of motion with eversion and inversion of the ankle of the right side.  Achilles tendon, other flexor, extensor tendons appear to be intact.  No area of pinpoint tenderness.  Flatfoot is evident. No pain with calf compression, erythema or warmth  Assessment: 60 year old female with bilateral chronic foot pain, peroneal tendon tear  Plan: -All treatment options discussed with the patient including all alternatives, risks, complications. -At this point we can discuss both conservative as well as surgical options.  We will continue with conservative treatment she wants to try to hold off any surgery.  Discussed with her long-term instability that her ankle but it seems to be improving with physical therapy already continue with this for now. -Note provided to work from home until next appointment.  Trula Slade DPM

## 2020-11-10 ENCOUNTER — Other Ambulatory Visit: Payer: Self-pay | Admitting: Student

## 2020-12-23 ENCOUNTER — Ambulatory Visit: Payer: BC Managed Care – PPO | Admitting: Podiatry

## 2020-12-28 ENCOUNTER — Encounter: Payer: Self-pay | Admitting: Podiatry

## 2020-12-28 ENCOUNTER — Ambulatory Visit: Payer: BC Managed Care – PPO | Admitting: Podiatry

## 2020-12-28 ENCOUNTER — Other Ambulatory Visit: Payer: Self-pay

## 2020-12-28 DIAGNOSIS — S96911S Strain of unspecified muscle and tendon at ankle and foot level, right foot, sequela: Secondary | ICD-10-CM | POA: Diagnosis not present

## 2020-12-28 DIAGNOSIS — R809 Proteinuria, unspecified: Secondary | ICD-10-CM | POA: Insufficient documentation

## 2020-12-28 DIAGNOSIS — G8929 Other chronic pain: Secondary | ICD-10-CM | POA: Diagnosis not present

## 2020-12-28 DIAGNOSIS — M25572 Pain in left ankle and joints of left foot: Secondary | ICD-10-CM | POA: Diagnosis not present

## 2020-12-28 DIAGNOSIS — E049 Nontoxic goiter, unspecified: Secondary | ICD-10-CM | POA: Insufficient documentation

## 2020-12-28 DIAGNOSIS — M25571 Pain in right ankle and joints of right foot: Secondary | ICD-10-CM

## 2020-12-28 DIAGNOSIS — E1165 Type 2 diabetes mellitus with hyperglycemia: Secondary | ICD-10-CM | POA: Insufficient documentation

## 2020-12-28 DIAGNOSIS — E785 Hyperlipidemia, unspecified: Secondary | ICD-10-CM | POA: Insufficient documentation

## 2020-12-28 DIAGNOSIS — E041 Nontoxic single thyroid nodule: Secondary | ICD-10-CM | POA: Insufficient documentation

## 2021-01-03 NOTE — Progress Notes (Signed)
Subjective: 60 year old female presents the office today for follow-up evaluation of bilateral foot/ankle pain.  She states that she has been doing physical therapy.  She has been walking with a walker more and getting faster with this.  Otherwise she still uses a wheelchair some as well.  Feels been doing better.  She still getting injections in her back which she is seeing some improvement with.  No new injuries that she reports.  Objective: AAO x3, NAD DP/PT pulses palpable bilaterally, CRT less than 3 seconds There is no significant swelling present bilaterally.  There is minimal discomfort along the course the peroneal tendon on the right side.  Strength appears to be improved bilaterally.  There is some minimal edema but there is no erythema or warmth.  Flexor, extensor tendons appear to be intact otherwise except the peroneal and there slight weakness with eversion on the right side.  No area pinpoint tenderness.  No open lesions bilaterally.   No pain with calf compression, erythema or warmth  Assessment: 60 year old female with bilateral chronic foot pain, peroneal tendon tear  Plan: -All treatment options discussed with the patient including all alternatives, risks, complications. -Overall at this point I would recommend her to continue physical therapy to help to strengthen her foot.  Discussed using good arch support.  I do not believe that surgery be beneficial at this point to repair the peroneal tendon.  She can walking better with a walker.  Seems that her back is what is limiting her from becoming more active. -Continue physical therapy as well as follow-up with her back doctor.  Trula Slade DPM

## 2021-02-28 ENCOUNTER — Ambulatory Visit: Payer: BC Managed Care – PPO | Admitting: Podiatry

## 2021-02-28 ENCOUNTER — Telehealth: Payer: Self-pay | Admitting: Podiatry

## 2021-02-28 NOTE — Telephone Encounter (Signed)
Patient would like to recertify her intermittent FMLA. Is it ok?

## 2021-03-01 ENCOUNTER — Other Ambulatory Visit: Payer: Self-pay | Admitting: Physical Medicine and Rehabilitation

## 2021-03-01 DIAGNOSIS — M545 Low back pain, unspecified: Secondary | ICD-10-CM

## 2021-03-04 ENCOUNTER — Other Ambulatory Visit: Payer: Self-pay

## 2021-03-04 ENCOUNTER — Ambulatory Visit: Payer: BC Managed Care – PPO | Admitting: Podiatry

## 2021-03-04 DIAGNOSIS — M25571 Pain in right ankle and joints of right foot: Secondary | ICD-10-CM

## 2021-03-04 DIAGNOSIS — G629 Polyneuropathy, unspecified: Secondary | ICD-10-CM

## 2021-03-04 DIAGNOSIS — M25572 Pain in left ankle and joints of left foot: Secondary | ICD-10-CM

## 2021-03-04 DIAGNOSIS — G8929 Other chronic pain: Secondary | ICD-10-CM

## 2021-03-04 DIAGNOSIS — S96911S Strain of unspecified muscle and tendon at ankle and foot level, right foot, sequela: Secondary | ICD-10-CM | POA: Diagnosis not present

## 2021-03-06 NOTE — Progress Notes (Signed)
Subjective: 60 year old female presents the office today for follow-up evaluation of bilateral foot/ankle pain.  She states that she is still doing physical therapy and she is making progress.  Her main concern that she gets numbness to her right foot.  She is scheduled to get another injection in her back on November 18.  She is interested to see if the injection will help much with her foot.  As she progressed with physical therapy she is actually driving and she walks with a walker.  No recent injury or falls.  Objective: AAO x3, NAD DP/PT pulses palpable bilaterally, CRT less than 3 seconds There is no significant swelling present bilaterally.  There is mild tenderness palpation diffusely to the right foot there is no specific area of pinpoint tenderness.  There is no edema, erythema.  MMT 5/5 today.  There is no significant pain over course the peroneal tendons of the Lisfranc joint.  Set given some mild discomfort into the left foot diffusely but no specific area of tenderness.  No pain with calf compression, erythema or warmth  Assessment: 60 year old female with bilateral chronic foot pain, peroneal tendon tear; neuritis  Plan: -All treatment options discussed with the patient including all alternatives, risks, complications. -Recommend continued physical therapy.  Continue gradual increase activity level.  I will also be interested to see if she gets any improvement from the injection her back is her main concern of the numbness that she gets intermittent to the right foot.  I do think this is likely coming from a nerve impingement in her back. -We will extend intermittent [place] DPM

## 2021-03-25 ENCOUNTER — Ambulatory Visit
Admission: RE | Admit: 2021-03-25 | Discharge: 2021-03-25 | Disposition: A | Discharge status: Home / Self Care | Payer: Self-pay | Source: Ambulatory Visit | Attending: Physical Medicine and Rehabilitation | Admitting: Physical Medicine and Rehabilitation

## 2021-03-25 ENCOUNTER — Other Ambulatory Visit: Payer: Self-pay

## 2021-03-25 DIAGNOSIS — M545 Low back pain, unspecified: Secondary | ICD-10-CM

## 2021-03-25 MED ORDER — METHYLPREDNISOLONE ACETATE 40 MG/ML INJ SUSP (RADIOLOG
80.0000 mg | Freq: Once | INTRAMUSCULAR | Status: AC
  Administered 2021-03-25: 80 mg via EPIDURAL

## 2021-03-25 MED ORDER — IOPAMIDOL (ISOVUE-M 200) INJECTION 41%
1.0000 mL | Freq: Once | INTRAMUSCULAR | Status: AC
  Administered 2021-03-25: 1 mL via EPIDURAL

## 2021-03-25 NOTE — Discharge Instructions (Signed)

## 2021-04-08 ENCOUNTER — Ambulatory Visit: Payer: BC Managed Care – PPO | Admitting: Podiatry

## 2021-04-13 ENCOUNTER — Other Ambulatory Visit: Payer: Self-pay | Admitting: Endocrinology

## 2021-04-13 DIAGNOSIS — E041 Nontoxic single thyroid nodule: Secondary | ICD-10-CM

## 2021-05-13 ENCOUNTER — Other Ambulatory Visit: Payer: Self-pay

## 2021-05-27 ENCOUNTER — Ambulatory Visit
Admission: RE | Admit: 2021-05-27 | Discharge: 2021-05-27 | Disposition: A | Discharge status: Home / Self Care | Payer: BC Managed Care – PPO | Source: Ambulatory Visit | Attending: Endocrinology | Admitting: Endocrinology

## 2021-05-27 DIAGNOSIS — E041 Nontoxic single thyroid nodule: Secondary | ICD-10-CM

## 2021-06-01 ENCOUNTER — Other Ambulatory Visit: Payer: Self-pay | Admitting: Endocrinology

## 2021-06-01 DIAGNOSIS — E041 Nontoxic single thyroid nodule: Secondary | ICD-10-CM

## 2021-06-06 ENCOUNTER — Other Ambulatory Visit: Payer: Self-pay | Admitting: Endocrinology

## 2021-06-06 DIAGNOSIS — E041 Nontoxic single thyroid nodule: Secondary | ICD-10-CM

## 2021-06-29 ENCOUNTER — Other Ambulatory Visit: Payer: BC Managed Care – PPO

## 2021-07-22 ENCOUNTER — Other Ambulatory Visit: Payer: Self-pay | Admitting: Physical Medicine and Rehabilitation

## 2021-07-22 ENCOUNTER — Other Ambulatory Visit: Payer: Self-pay | Admitting: Family Medicine

## 2021-07-22 DIAGNOSIS — M5416 Radiculopathy, lumbar region: Secondary | ICD-10-CM

## 2021-07-28 ENCOUNTER — Other Ambulatory Visit: Payer: Self-pay | Admitting: Family Medicine

## 2021-07-29 ENCOUNTER — Ambulatory Visit
Admission: RE | Admit: 2021-07-29 | Discharge: 2021-07-29 | Disposition: A | Discharge status: Home / Self Care | Payer: Worker's Compensation | Source: Ambulatory Visit | Attending: Physical Medicine and Rehabilitation | Admitting: Physical Medicine and Rehabilitation

## 2021-07-29 ENCOUNTER — Other Ambulatory Visit: Payer: Self-pay

## 2021-07-29 DIAGNOSIS — M5416 Radiculopathy, lumbar region: Secondary | ICD-10-CM

## 2021-07-29 MED ORDER — METHYLPREDNISOLONE ACETATE 40 MG/ML INJ SUSP (RADIOLOG
80.0000 mg | Freq: Once | INTRAMUSCULAR | Status: AC
  Administered 2021-07-29: 80 mg via EPIDURAL

## 2021-07-29 MED ORDER — IOPAMIDOL (ISOVUE-M 200) INJECTION 41%
1.0000 mL | Freq: Once | INTRAMUSCULAR | Status: AC
  Administered 2021-07-29: 1 mL via EPIDURAL

## 2021-07-29 NOTE — Discharge Instructions (Signed)
Post Procedure Spinal Discharge Instruction Sheet ? ?You may resume a regular diet and any medications that you routinely take (including pain medications) unless otherwise noted by MD. ? ?No driving day of procedure. ? ?Light activity throughout the rest of the day.  Do not do any strenuous work, exercise, bending or lifting.  The day following the procedure, you can resume normal physical activity but you should refrain from exercising or physical therapy for at least three days thereafter. ? ?You may apply ice to the injection site, 20 minutes on, 20 minutes off, as needed. Do not apply ice directly to skin.  ? ? ?Common Side Effects: ? ?Headaches- take your usual medications as directed by your physician.  Increase your fluid intake.  Caffeinated beverages may be helpful.  Lie flat in bed until your headache resolves. ? ?Restlessness or inability to sleep- you may have trouble sleeping for the next few days.  Ask your referring physician if you need any medication for sleep. ? ?Facial flushing or redness- should subside within a few days. ? ?Increased pain- a temporary increase in pain a day or two following your procedure is not unusual.  Take your pain medication as prescribed by your referring physician. ? ?Leg cramps ? ?Please contact our office at [phone] for the following symptoms: ?Fever greater than 100 degrees. ?Headaches unresolved with medication after 2-3 days. ?Increased swelling, pain, or redness at injection site. ? ? ?Thank you for visiting [facility] today.  ?RESUME WARFARIN 12 HOURS AFTER INJECTION!  ?

## 2021-08-04 ENCOUNTER — Other Ambulatory Visit (HOSPITAL_COMMUNITY)
Admission: RE | Admit: 2021-08-04 | Discharge: 2021-08-04 | Disposition: A | Discharge status: Home / Self Care | Payer: BC Managed Care – PPO | Source: Ambulatory Visit | Attending: Endocrinology | Admitting: Endocrinology

## 2021-08-04 ENCOUNTER — Ambulatory Visit
Admission: RE | Admit: 2021-08-04 | Discharge: 2021-08-04 | Disposition: A | Discharge status: Home / Self Care | Payer: BC Managed Care – PPO | Source: Ambulatory Visit | Attending: Endocrinology | Admitting: Endocrinology

## 2021-08-04 DIAGNOSIS — E041 Nontoxic single thyroid nodule: Secondary | ICD-10-CM

## 2021-08-04 DIAGNOSIS — E042 Nontoxic multinodular goiter: Secondary | ICD-10-CM | POA: Diagnosis not present

## 2021-08-05 LAB — CYTOLOGY - NON PAP

## 2021-08-08 LAB — CYTOLOGY - NON PAP

## 2021-08-24 ENCOUNTER — Encounter (HOSPITAL_COMMUNITY): Payer: Self-pay

## 2021-10-14 ENCOUNTER — Encounter: Payer: Self-pay | Admitting: Internal Medicine

## 2021-10-24 ENCOUNTER — Ambulatory Visit: Payer: BC Managed Care – PPO | Admitting: Podiatry

## 2021-10-24 ENCOUNTER — Ambulatory Visit (INDEPENDENT_AMBULATORY_CARE_PROVIDER_SITE_OTHER): Payer: BC Managed Care – PPO

## 2021-10-24 DIAGNOSIS — S92515A Nondisplaced fracture of proximal phalanx of left lesser toe(s), initial encounter for closed fracture: Secondary | ICD-10-CM

## 2021-10-24 DIAGNOSIS — M7751 Other enthesopathy of right foot: Secondary | ICD-10-CM | POA: Diagnosis not present

## 2021-10-24 DIAGNOSIS — M79672 Pain in left foot: Secondary | ICD-10-CM

## 2021-10-24 DIAGNOSIS — G8929 Other chronic pain: Secondary | ICD-10-CM

## 2021-10-24 MED ORDER — TRIAMCINOLONE ACETONIDE 10 MG/ML IJ SUSP
10.0000 mg | Freq: Once | INTRAMUSCULAR | Status: AC
  Administered 2021-10-24: 10 mg

## 2021-10-25 ENCOUNTER — Other Ambulatory Visit: Payer: Self-pay | Admitting: General Surgery

## 2021-10-25 DIAGNOSIS — R222 Localized swelling, mass and lump, trunk: Secondary | ICD-10-CM

## 2021-10-30 NOTE — Progress Notes (Signed)
Subjective: 61 year old female presents the office today for concerns of pain to her left foot.  She is about 2 weeks ago on June 6 she hit her left foot on a door and since then she has had some pain and achiness on the left fourth digit inside of her foot.  Otherwise she states that she has been doing better in regards to the ankle but she is asking for steroid injection on the right side point in the sinus tarsi as this is what causes the majority of discomfort when she tries to walk.  She is try to become more active.  Objective: AAO x3, NAD DP/PT pulses palpable bilaterally, CRT less than 3 seconds On the right side the majority tenderness is localized on the sinus tarsi.  Trace edema there is no erythema or warmth.  There is no specific area pinpoint tenderness noted today.  MMT 4/5 on the right.  On the left foot there is tenderness on the fourth digit there is mild edema to the toe there is no erythema or open lesions.  No other areas of pinpoint tenderness in the left foot today.  No pain with calf compression, swelling, warmth, erythema  Assessment: 61 year old female left fourth toe fracture, capsulitis right foot  Plan: -All treatment options discussed with the patient including all alternatives, risks, complications.  -X-rays obtained reviewed left foot.  3 views were obtained.  Fracture noted on the proximal phalanx of the fourth digit. -Regards to the left foot recommend surgical shoe which she has at home as well as buddy splinting. -For the right foot steroid injection performed the sinus tarsi.  Skin was cleaned with Betadine, alcohol mixture 1 cc Kenalog 10, 0.5 cc of Marcaine plain, 0.5 cc of lidocaine plain was infiltrated into the sinus tarsi without complications.  Postinjection care discussed.  Tolerated the procedure well. -Continue supportive shoe gear, physical therapy. -Patient encouraged to call the office with any questions, concerns, change in symptoms.   Vivi Barrack DPM

## 2021-11-04 ENCOUNTER — Other Ambulatory Visit: Payer: Self-pay | Admitting: Podiatry

## 2021-11-04 DIAGNOSIS — S92515A Nondisplaced fracture of proximal phalanx of left lesser toe(s), initial encounter for closed fracture: Secondary | ICD-10-CM

## 2021-11-22 ENCOUNTER — Other Ambulatory Visit: Payer: Self-pay | Admitting: Physical Medicine and Rehabilitation

## 2021-11-22 DIAGNOSIS — G8929 Other chronic pain: Secondary | ICD-10-CM

## 2021-11-24 ENCOUNTER — Ambulatory Visit
Admission: RE | Admit: 2021-11-24 | Discharge: 2021-11-24 | Disposition: A | Discharge status: Home / Self Care | Payer: BC Managed Care – PPO | Source: Ambulatory Visit | Attending: General Surgery | Admitting: General Surgery

## 2021-11-24 DIAGNOSIS — R222 Localized swelling, mass and lump, trunk: Secondary | ICD-10-CM

## 2021-11-24 MED ORDER — IOPAMIDOL (ISOVUE-300) INJECTION 61%
100.0000 mL | Freq: Once | INTRAVENOUS | Status: AC | PRN
  Administered 2021-11-24: 100 mL via INTRAVENOUS

## 2021-11-25 ENCOUNTER — Ambulatory Visit: Payer: BC Managed Care – PPO | Admitting: Internal Medicine

## 2021-11-29 ENCOUNTER — Inpatient Hospital Stay
Admission: RE | Admit: 2021-11-29 | Discharge: 2021-11-29 | Disposition: A | Discharge status: Home / Self Care | Payer: BC Managed Care – PPO | Source: Ambulatory Visit | Attending: Physical Medicine and Rehabilitation | Admitting: Physical Medicine and Rehabilitation

## 2021-11-29 NOTE — Discharge Instructions (Signed)
Post Procedure Spinal Discharge Instruction Sheet  You may resume a regular diet and any medications that you routinely take (including pain medications) unless otherwise noted by MD.  No driving day of procedure.  Light activity throughout the rest of the day.  Do not do any strenuous work, exercise, bending or lifting.  The day following the procedure, you can resume normal physical activity but you should refrain from exercising or physical therapy for at least three days thereafter.  You may apply ice to the injection site, 20 minutes on, 20 minutes off, as needed. Do not apply ice directly to skin.    Common Side Effects:  Headaches- take your usual medications as directed by your physician.  Increase your fluid intake.  Caffeinated beverages may be helpful.  Lie flat in bed until your headache resolves.  Restlessness or inability to sleep- you may have trouble sleeping for the next few days.  Ask your referring physician if you need any medication for sleep.  Facial flushing or redness- should subside within a few days.  Increased pain- a temporary increase in pain a day or two following your procedure is not unusual.  Take your pain medication as prescribed by your referring physician.  Leg cramps  Please contact our office at [phone] for the following symptoms: Fever greater than 100 degrees. Headaches unresolved with medication after 2-3 days. Increased swelling, pain, or redness at injection site.   Thank you for visiting [facility] today.    YOU MAY RESUME YOUR COUMADIN 12 HOURS POST PROCEDURE

## 2021-12-05 ENCOUNTER — Ambulatory Visit: Payer: BC Managed Care – PPO | Admitting: Podiatry

## 2021-12-05 ENCOUNTER — Ambulatory Visit (INDEPENDENT_AMBULATORY_CARE_PROVIDER_SITE_OTHER): Payer: BC Managed Care – PPO

## 2021-12-05 DIAGNOSIS — M25571 Pain in right ankle and joints of right foot: Secondary | ICD-10-CM | POA: Diagnosis not present

## 2021-12-05 DIAGNOSIS — S92515A Nondisplaced fracture of proximal phalanx of left lesser toe(s), initial encounter for closed fracture: Secondary | ICD-10-CM

## 2021-12-05 DIAGNOSIS — M25572 Pain in left ankle and joints of left foot: Secondary | ICD-10-CM

## 2021-12-05 DIAGNOSIS — M7751 Other enthesopathy of right foot: Secondary | ICD-10-CM

## 2021-12-05 DIAGNOSIS — S92515D Nondisplaced fracture of proximal phalanx of left lesser toe(s), subsequent encounter for fracture with routine healing: Secondary | ICD-10-CM | POA: Diagnosis not present

## 2021-12-05 DIAGNOSIS — S96911S Strain of unspecified muscle and tendon at ankle and foot level, right foot, sequela: Secondary | ICD-10-CM | POA: Diagnosis not present

## 2021-12-05 DIAGNOSIS — G8929 Other chronic pain: Secondary | ICD-10-CM

## 2021-12-08 NOTE — Progress Notes (Signed)
Subjective: 61 year old female presents the office today for concerns of pain to her left foot.  She has had left fourth toe is doing better since the injury.  In regards to the pain or if needed she gets sharp pain running down her leg into her right foot she was post have an injection in her back but she has not yet had this done.  No recent injury or changes otherwise.  The injection previously has been helpful in the right foot.  Objective: AAO x3, NAD DP/PT pulses palpable bilaterally, CRT less than 3 seconds On the right side the majority tenderness is localized on the sinus tarsi.  Trace edema there is no erythema or warmth.  There is no specific area pinpoint tenderness noted today.  MMT 4/5 on the right.  Overall exam unchanged. On the left foot there is decreased tenderness on the fourth digit there is mild edema to the toe there is no erythema or open lesions.  No other areas of pinpoint tenderness in the left foot today.  No pain with calf compression, swelling, warmth, erythema  Assessment: 61 year old female left fourth toe fracture, capsulitis right foot  Plan: -All treatment options discussed with the patient including all alternatives, risks, complications.  -X-rays obtained reviewed left foot.  3 views were obtained.  Fracture noted on the proximal phalanx of the fourth digit with bone callus formation. -Left foot continue surgical shoe for now until the symptoms resolve likely.  Then she can transition to regular shoe. -We discussed continued physical therapy as she does make good progress with this.  I ordered physical therapy for [facility] as she does do aquatic therapy. -We will plan injection of the foot today she has had several and also like for her to get the injection in her back to see what is helpful.  Trula Slade DPM

## 2021-12-14 ENCOUNTER — Ambulatory Visit
Admission: RE | Admit: 2021-12-14 | Discharge: 2021-12-14 | Disposition: A | Discharge status: Home / Self Care | Payer: BC Managed Care – PPO | Source: Ambulatory Visit | Attending: Physical Medicine and Rehabilitation | Admitting: Physical Medicine and Rehabilitation

## 2021-12-14 DIAGNOSIS — G8929 Other chronic pain: Secondary | ICD-10-CM

## 2021-12-14 MED ORDER — METHYLPREDNISOLONE ACETATE 40 MG/ML INJ SUSP (RADIOLOG
80.0000 mg | Freq: Once | INTRAMUSCULAR | Status: AC
  Administered 2021-12-14: 80 mg via EPIDURAL

## 2021-12-14 MED ORDER — IOPAMIDOL (ISOVUE-M 200) INJECTION 41%
1.0000 mL | Freq: Once | INTRAMUSCULAR | Status: AC
  Administered 2021-12-14: 1 mL via EPIDURAL

## 2021-12-14 NOTE — Discharge Instructions (Signed)
Post Procedure Spinal Discharge Instruction Sheet  You may resume a regular diet and any medications that you routinely take (including pain medications) unless otherwise noted by MD.  No driving day of procedure.  Light activity throughout the rest of the day.  Do not do any strenuous work, exercise, bending or lifting.  The day following the procedure, you can resume normal physical activity but you should refrain from exercising or physical therapy for at least three days thereafter.  You may apply ice to the injection site, 20 minutes on, 20 minutes off, as needed. Do not apply ice directly to skin.    Common Side Effects:  Headaches- take your usual medications as directed by your physician.  Increase your fluid intake.  Caffeinated beverages may be helpful.  Lie flat in bed until your headache resolves.  Restlessness or inability to sleep- you may have trouble sleeping for the next few days.  Ask your referring physician if you need any medication for sleep.  Facial flushing or redness- should subside within a few days.  Increased pain- a temporary increase in pain a day or two following your procedure is not unusual.  Take your pain medication as prescribed by your referring physician.  Leg cramps  Please contact our office at [phone] for the following symptoms: Fever greater than 100 degrees. Headaches unresolved with medication after 2-3 days. Increased swelling, pain, or redness at injection site.   Thank you for visiting [facility] today.    YOU MAY RESUME YOUR COUMADIN 12 HRS POST PROCEDURE OR AS DIRECTED BY THE COUMADIN CLINIC

## 2022-01-06 ENCOUNTER — Encounter: Payer: Self-pay | Admitting: Internal Medicine

## 2022-01-06 ENCOUNTER — Ambulatory Visit: Payer: BC Managed Care – PPO | Admitting: Internal Medicine

## 2022-01-06 VITALS — BP 142/80 | HR 80 | Ht 64.0 in | Wt 379.0 lb

## 2022-01-06 DIAGNOSIS — Z1211 Encounter for screening for malignant neoplasm of colon: Secondary | ICD-10-CM

## 2022-01-06 NOTE — Progress Notes (Signed)
Chief Complaint: Colon cancer screening  HPI : 61 year old female with history of DM, morbid obesity, hidradenitis suppurativa, DVT on warfarin presents to discuss colon cancer screening.  She denies blood in stools, changes in bowel habits, or weight loss. Denies ab pain, N&V, dysphagia, or acid reflux issues. She is on warfarin due to a history of DVT. Her last colonoscopy was in 2012 that was normal except for internal hemorrhoids. Denies family history of colon cancer.   Past Medical History:  Diagnosis Date   Diabetes mellitus without complication ([place])    Fat necrosis 05/28/2018   Hidradenitis suppurativa 05/28/2018   Hypertension    Morbid obesity ([place])    Right second toe ulcer ([place]) 05/28/2018   Past Surgical History:  Procedure Laterality Date   CESAREAN SECTION     x2   CHOLECYSTECTOMY     HERNIA REPAIR     TONSILLECTOMY     Family History  Problem Relation Age of Onset   Colon cancer Neg Hx    Stomach cancer Neg Hx    Esophageal cancer Neg Hx    Colon polyps Neg Hx    Social History   Tobacco Use   Smoking status: Never   Smokeless tobacco: Never  Vaping Use   Vaping Use: Never used  Substance Use Topics   Alcohol use: No   Drug use: No   Current Outpatient Medications  Medication Sig Dispense Refill   amLODipine (NORVASC) 10 MG tablet Take 10 mg by mouth daily.  8   atorvastatin (LIPITOR) 10 MG tablet TAKE 1 TABLET 3 DAYS A WEEK     B-D UF III MINI PEN NEEDLES 31G X 5 MM MISC USE AS DIRECTED 5 TIMES A DAY     Blood Glucose Monitoring Suppl (FREESTYLE FREEDOM LITE) w/Device KIT See admin instructions.     folic acid (FOLVITE) 1 MG tablet Take 1 mg by mouth daily.     HYDROcodone-acetaminophen (NORCO/VICODIN) 5-325 MG tablet      JANUVIA 100 MG tablet Take 100 mg by mouth daily.  4   Lancets (ONETOUCH DELICA PLUS ZOXWRU04V) MISC USE AS DIRECTED ONCE A DAY.     LEVEMIR FLEXTOUCH 100 UNIT/ML Pen Inject 75 Units into the skin 2 (two) times daily.       losartan-hydrochlorothiazide (HYZAAR) 100-25 MG tablet Take 1 tablet by mouth daily.  4   NOVOLOG FLEXPEN 100 UNIT/ML FlexPen Inject 30 Units into the skin 3 (three) times daily with meals.      ONETOUCH VERIO test strip      pregabalin (LYRICA) 50 MG capsule pregabalin 50 mg capsule  TAKE 1 CAPSULE BY MOUTH TWICE A DAY     rosuvastatin (CRESTOR) 10 MG tablet rosuvastatin 10 mg tablet  TAKE 1 TABLET BY MOUTH FOR 3 DAYS A WEEK     warfarin (COUMADIN) 5 MG tablet Take 1 tablet (5 mg total) by mouth daily at 6 PM. 30 tablet 0   allopurinol (ZYLOPRIM) 100 MG tablet Take 1 tablet (100 mg total) by mouth daily. (Patient not taking: Reported on 01/06/2022) 30 tablet 6   allopurinol (ZYLOPRIM) 100 MG tablet 1 tablet (Patient not taking: Reported on 01/06/2022)     amLODipine (NORVASC) 10 MG tablet Take 1 tablet by mouth daily. (Patient not taking: Reported on 01/06/2022)     azithromycin (ZITHROMAX) 250 MG tablet TAKE 2 TABLETS BY MOUTH TODAY, THEN TAKE 1 TABLET DAILY FOR 4 DAYS (Patient not taking: Reported on 01/06/2022)  clindamycin (CLEOCIN T) 1 % lotion Apply topically daily. to face (Patient not taking: Reported on 01/06/2022)     clotrimazole-betamethasone (LOTRISONE) cream APPLY TO AFFECTED AREA TWICE A DAY *APPLY THINLY & DONT USE LONGER THAN 7 DAYS* (Patient not taking: Reported on 01/06/2022)     colchicine 0.6 MG tablet Take 1 tablet (0.6 mg total) by mouth daily. (Patient not taking: Reported on 01/06/2022) 10 tablet 0   gabapentin (NEURONTIN) 100 MG capsule gabapentin 100 mg capsule  TAKE 1 CAPSULE BY MOUTH AT BEDTIME (Patient not taking: Reported on 01/06/2022)     gabapentin (NEURONTIN) 300 MG capsule Take 1 capsule (300 mg total) by mouth at bedtime. (Patient not taking: Reported on 01/06/2022) 90 capsule 0   hydrocortisone 2.5 % lotion  (Patient not taking: Reported on 01/06/2022)     metroNIDAZOLE (METROGEL) 1 % gel APPLY TO AFFECTED AREA EVERY DAY (Patient not taking: Reported on 01/06/2022)      mupirocin ointment (BACTROBAN) 2 % Apply topically. (Patient not taking: Reported on 01/06/2022)     traMADol (ULTRAM) 50 MG tablet tramadol 50 mg tablet  TAKE 1 TABLET BY MOUTH EVERY 6 HOURS AS NEEDED FOR PAIN (Patient not taking: Reported on 01/06/2022)     No current facility-administered medications for this visit.   Allergies  Allergen Reactions   Penicillins Itching and Rash   Review of Systems: All systems reviewed and negative except where noted in HPI.   Physical Exam: BP (!) 142/80   Pulse 80   Ht _0  (1.626 m)   Wt (!) 379 lb (171.9 kg)   BMI 65.06 kg/m  Constitutional: Pleasant,well-developed, female in no acute distress. HEENT: Normocephalic and atraumatic. Conjunctivae are normal. No scleral icterus. Cardiovascular: Normal rate, regular rhythm.  Pulmonary/chest: Effort normal and breath sounds normal. No wheezing, rales or rhonchi. Abdominal: Soft, nondistended, nontender. Bowel sounds active throughout. There are no masses palpable. No hepatomegaly. Extremities: No edema Neurological: Alert and oriented to person place and time. Skin: Skin is warm and dry. No rashes noted. Psychiatric: Normal mood and affect. Behavior is normal.  Labs 08/2019: CBC with elevated WBC of 14.4.   Colonoscopy 11/25/10: Internal hemorrhoids. Normal colonoscopy.  ASSESSMENT AND PLAN: Colon cancer screening Patient presents to discuss options for colon cancer screening such as a stool test or a colonoscopy procedure. I went over these options in detail with the patient, and she has opted to proceed with Cologuard test. If she needs a colonoscopy in the future, she will need her procedure to be done in the hospital since her BMI>50, and her warfarin would need to be held prior to the procedure. - Cologuard  Christia Reading, MD

## 2022-01-06 NOTE — Patient Instructions (Signed)
If you are age 62 or older, your body mass index should be between 23-30. Your Body mass index is 65.06 kg/m. If this is out of the aforementioned range listed, please consider follow up with your Primary Care Provider.  If you are age 33 or younger, your body mass index should be between 19-25. Your Body mass index is 65.06 kg/m. If this is out of the aformentioned range listed, please consider follow up with your Primary Care Provider.   Your provider has ordered Cologuard testing as an option for colon cancer screening. This is performed by [employer] and may be out of network with your insurance. PRIOR to completing the test, it is YOUR responsibility to contact your insurance about covered benefits for this test. Your out of pocket expense could be anywhere from $0.00 to $649.00.   When you call to check coverage with your insurer, please provide the following information:   -The ONLY provider of Cologuard is [place] code for Cologuard is [id number].  [employer] NPI # [id number]  -[employer] Tax ID # [id number]   We have already sent your demographic and insurance information to [employer] (phone number [phone]) and they should contact you within the next week regarding your test. If you have not heard from them within the next week, please call our office at [phone].   The [facility] providers would like to encourage you to use [facility] to communicate with providers for non-urgent requests or questions.  Due to long hold times on the telephone, sending your provider a message by [facility] may be a faster and more efficient way to get a response.  Please allow 48 business hours for a response.  Please remember that this is for non-urgent requests.   Thank you for entrusting me with your care and for choosing [facility], Dr. Christia Reading

## 2022-01-17 ENCOUNTER — Ambulatory Visit: Payer: BC Managed Care – PPO | Admitting: Podiatry

## 2022-01-18 ENCOUNTER — Telehealth: Payer: Self-pay | Admitting: *Deleted

## 2022-01-18 NOTE — Telephone Encounter (Signed)
Patient is calling because she did not understand going to [place] [facility], not directly into the aquatic therapy.   [place] and spoke with Sonia Baller, said that they do an assessment/evaluation/documents that are appropriate first visit before starting the water therapy. Returned the call to patient,no answer, left vmessage w/ clarifications on her Aquatic PT.

## 2022-01-20 ENCOUNTER — Ambulatory Visit: Payer: BC Managed Care – PPO | Attending: Podiatry

## 2022-01-20 ENCOUNTER — Other Ambulatory Visit: Payer: Self-pay

## 2022-01-20 DIAGNOSIS — M25572 Pain in left ankle and joints of left foot: Secondary | ICD-10-CM | POA: Diagnosis not present

## 2022-01-20 DIAGNOSIS — M25571 Pain in right ankle and joints of right foot: Secondary | ICD-10-CM | POA: Diagnosis not present

## 2022-01-20 DIAGNOSIS — M25562 Pain in left knee: Secondary | ICD-10-CM | POA: Diagnosis present

## 2022-01-20 DIAGNOSIS — G8929 Other chronic pain: Secondary | ICD-10-CM | POA: Diagnosis present

## 2022-01-20 DIAGNOSIS — M6281 Muscle weakness (generalized): Secondary | ICD-10-CM | POA: Insufficient documentation

## 2022-01-20 DIAGNOSIS — M25561 Pain in right knee: Secondary | ICD-10-CM | POA: Insufficient documentation

## 2022-01-20 DIAGNOSIS — S96911S Strain of unspecified muscle and tendon at ankle and foot level, right foot, sequela: Secondary | ICD-10-CM | POA: Diagnosis not present

## 2022-01-20 DIAGNOSIS — R262 Difficulty in walking, not elsewhere classified: Secondary | ICD-10-CM | POA: Insufficient documentation

## 2022-01-20 NOTE — Therapy (Unsigned)
OUTPATIENT PHYSICAL THERAPY LOWER EXTREMITY EVALUATION   Patient Name: Natasha Rollins MRN: [id number] DOB:May 28, 1960, 61 y.o., female Today's Date: 01/21/2022   PT End of Session - 01/20/22 1028     Visit Number 1    Date for PT Re-Evaluation 03/17/22    Authorization Type BCBS    Authorization - Visit Number 1    Progress Note Due on Visit 10    PT Start Time 1020    PT Stop Time 1100    PT Time Calculation (min) 40 min    Activity Tolerance Patient tolerated treatment well    Behavior During Therapy WFL for tasks assessed/performed             Past Medical History:  Diagnosis Date   Diabetes mellitus without complication ([place])    Fat necrosis 05/28/2018   Hidradenitis suppurativa 05/28/2018   Hypertension    Morbid obesity ([place])    Right second toe ulcer ([place]) 05/28/2018   Past Surgical History:  Procedure Laterality Date   CESAREAN SECTION     x2   CHOLECYSTECTOMY     HERNIA REPAIR     TONSILLECTOMY     Patient Active Problem List   Diagnosis Date Noted   Hyperglycemia due to type 2 diabetes mellitus ([place]) 12/28/2020   Hyperlipidemia 12/28/2020   Non-toxic goiter 12/28/2020   Proteinuria 12/28/2020   Thyroid nodule 12/28/2020   Pain in right hand 03/23/2020   Sprain of foot 06/27/2019   Synovial cyst of right popliteal space 05/15/2019   Spoon shaped nails 07/20/2018   Right second toe ulcer ([place]) 05/28/2018   Hidradenitis suppurativa 05/28/2018   Fat necrosis 05/28/2018   Osteoarthritis of knee 04/17/2018   MRSA (methicillin resistant staph aureus) culture positive 03/29/2018   DVT of lower extremity, bilateral ([place]) 02/19/2018   Pain in right foot 12/07/2017   Leukocytosis 06/21/2016   Pulmonary emboli ([place]) 06/20/2016   Diabetes ([place]) 12/22/2012   Hypertension 12/22/2012   Morbid obesity with BMI of 60.0-69.9, adult ([place]) 07/08/2010   OVARIAN CYST 07/08/2010    PCP: Jonathon Jordan, MD   REFERRING PROVIDER: Trula Slade, DPM    REFERRING DIAG: [phone] (ICD-10-CM) - Tendon tear, ankle, right, sequela M25.571,G89.29,M25.572 (ICD-10-CM) - Chronic pain of both ankles   THERAPY DIAG:  Difficulty in walking, not elsewhere classified  Muscle weakness (generalized)  Chronic pain of right knee  Chronic pain of left knee  Rationale for Evaluation and Treatment Rehabilitation  ONSET DATE: 12/05/2021   SUBJECTIVE:   SUBJECTIVE STATEMENT: Patient reports bilateral knee pain right worse than left due to a fall where she landed on her right knee.  She has also injured both ankles since that time.  She is obese and wants to avoid getting to a point where she is unable to walk.  She works as a Animal nutritionist.  She is in a wheelchair most of the time.     PERTINENT HISTORY: na  PAIN:  Are you having pain? Yes: NPRS scale: 6/10 Pain location: bilateral knees and ankles Pain description: aching Aggravating factors: walking Relieving factors: rest, wheelchair vs walking  PRECAUTIONS: None  WEIGHT BEARING RESTRICTIONS No  FALLS:  Has patient fallen in last 6 months? Yes. Number of falls 1  LIVING ENVIRONMENT: Lives with: lives with their family Lives in: House/apartment Stairs: Yes: External: 4 steps; can reach both Has following equipment at home:  walker, wheelchair  OCCUPATION: School counselor  PLOF: Independent with basic ADLs, Independent with household mobility without device,  Independent with community mobility without device, Independent with gait, Independent with transfers, Needs assistance with homemaking, and Vocation/Vocational requirements: school counselor, must be able to get in/out of school building.  PATIENT GOALS to be able to walk and discontinue wheelchair   OBJECTIVE:   DIAGNOSTIC FINDINGS: none  PATIENT SURVEYS:  LEFS 10/80, 12.5% self percieved ability, 87.5% self percieved disability FOTO 11 (goal is 40)  COGNITION:  Overall cognitive status: Within functional limits  for tasks assessed     SENSATION: WFL  POSTURE: flexed trunk   PALPATION: Mild crepitus bilateral knees  LOWER EXTREMITY ROM:  WFL  LOWER EXTREMITY MMT:  Generally 4-/5 bilateral LE's  FUNCTIONAL TESTS:  5 times sit to stand: 25.6 sec Timed up and go (TUG): 48.6 sec  GAIT: Distance walked: 50 feet Assistive device utilized: Environmental consultant - 2 wheeled and Wheelchair (manual) Level of assistance: Modified independence Comments: antalgic guarded    TODAY'S TREATMENT: Initial eval completed  HOME EXERCISE PROGRAM: Encouraged patient to continue to try and walk as much as tolerated.    ASSESSMENT:  CLINICAL IMPRESSION: Patient is a 61 y.o. female who was seen today for physical therapy evaluation and treatment for bilateral knee pain and instability as well as bilateral ankle pain.  She is likely not a candidate for joint replacement or other surgeries due to her size and BMI.  She would benefit from beginning therapy in the pool and diligent effort toward her weight loss to prepare her for joint replacement if this is discussed as an option.   OBJECTIVE IMPAIRMENTS Abnormal gait, decreased activity tolerance, decreased balance, decreased mobility, difficulty walking, decreased ROM, decreased strength, impaired flexibility, postural dysfunction, obesity, and pain.   ACTIVITY LIMITATIONS carrying, lifting, bending, sitting, standing, squatting, stairs, transfers, bed mobility, bathing, toileting, and dressing  PARTICIPATION LIMITATIONS: meal prep, cleaning, laundry, driving, shopping, community activity, occupation, yard work, and church  [place], Profession, Time since onset of injury/illness/exacerbation, and 1-2 comorbidities: obesity, htn  are also affecting patient's functional outcome.   REHAB POTENTIAL: Fair based on patients size  CLINICAL DECISION MAKING: Evolving/moderate complexity  EVALUATION COMPLEXITY: Moderate   GOALS: Goals reviewed with  patient? Yes  SHORT TERM GOALS: Target date: 02/19/2022  Pain report to be no greater than 4/10  Baseline: Goal status: INITIAL  LONG TERM GOALS: Target date: 03/19/2022   Patient to report pain no greater than 2/10  Baseline:  Goal status: INITIAL  2.  Patient to be able to walk 300 feet with rolling walker without knee instability safely Baseline:  Goal status: INITIAL  3.  Patient to be able to do all ADL's independently and safely Baseline:  Goal status: INITIAL  4.  Patient to be able to do basic IADL's independently and safely. Baseline:  Goal status: INITIAL  5.  Patient to report 85% improvement in overall condition and function Baseline:  Goal status: INITIAL    PLAN: PT FREQUENCY: 1-2x/week  PT DURATION: 8 weeks  PLANNED INTERVENTIONS: Therapeutic exercises, Therapeutic activity, Neuromuscular re-education, Balance training, Gait training, Patient/Family education, Self Care, Joint mobilization, Stair training, Aquatic Therapy, Dry Needling, Electrical stimulation, Cryotherapy, Moist heat, Taping, Vasopneumatic device, Ultrasound, Manual therapy, and Re-evaluation  PLAN FOR NEXT SESSION: Aquatics therapy program   [place] B. Javyn Havlin, PT 01/22/22 8:33 PM

## 2022-02-01 ENCOUNTER — Ambulatory Visit: Payer: BC Managed Care – PPO | Admitting: Physical Therapy

## 2022-02-03 ENCOUNTER — Ambulatory Visit: Payer: BC Managed Care – PPO | Admitting: Physical Therapy

## 2022-02-03 ENCOUNTER — Encounter: Payer: Self-pay | Admitting: Physical Therapy

## 2022-02-03 DIAGNOSIS — G8929 Other chronic pain: Secondary | ICD-10-CM

## 2022-02-03 DIAGNOSIS — R262 Difficulty in walking, not elsewhere classified: Secondary | ICD-10-CM | POA: Diagnosis not present

## 2022-02-03 DIAGNOSIS — M6281 Muscle weakness (generalized): Secondary | ICD-10-CM

## 2022-02-03 NOTE — Therapy (Signed)
OUTPATIENT PHYSICAL THERAPY TREATMENT NOTE   Patient Name: Natasha Rollins MRN: [id number] DOB:Jun 12, 1960, 61 y.o., female Today's Date: 02/03/2022  PCP: Jonathon Jordan, MD  REFERRING PROVIDER: Trula Slade, DPM   END OF SESSION:   PT End of Session - 02/03/22 2037     Visit Number 2    Date for PT Re-Evaluation 03/17/22    Authorization Type BCBS    Authorization - Visit Number 2    Progress Note Due on Visit 10    PT Start Time 1410   pt 25 min late   PT Stop Time 1430    PT Time Calculation (min) 20 min    Activity Tolerance Patient tolerated treatment well    Behavior During Therapy Anxious             Past Medical History:  Diagnosis Date   Diabetes mellitus without complication ([place])    Fat necrosis 05/28/2018   Hidradenitis suppurativa 05/28/2018   Hypertension    Morbid obesity ([place])    Right second toe ulcer ([place]) 05/28/2018   Past Surgical History:  Procedure Laterality Date   CESAREAN SECTION     x2   CHOLECYSTECTOMY     HERNIA REPAIR     TONSILLECTOMY     Patient Active Problem List   Diagnosis Date Noted   Hyperglycemia due to type 2 diabetes mellitus ([place]) 12/28/2020   Hyperlipidemia 12/28/2020   Non-toxic goiter 12/28/2020   Proteinuria 12/28/2020   Thyroid nodule 12/28/2020   Pain in right hand 03/23/2020   Sprain of foot 06/27/2019   Synovial cyst of right popliteal space 05/15/2019   Spoon shaped nails 07/20/2018   Right second toe ulcer ([place]) 05/28/2018   Hidradenitis suppurativa 05/28/2018   Fat necrosis 05/28/2018   Osteoarthritis of knee 04/17/2018   MRSA (methicillin resistant staph aureus) culture positive 03/29/2018   DVT of lower extremity, bilateral ([place]) 02/19/2018   Pain in right foot 12/07/2017   Leukocytosis 06/21/2016   Pulmonary emboli ([place]) 06/20/2016   Diabetes ([place]) 12/22/2012   Hypertension 12/22/2012   Morbid obesity with BMI of 60.0-69.9, adult ([place]) 07/08/2010   OVARIAN CYST 07/08/2010    REFERRING  DIAG: Difficulty in walking, not elsewhere classified    THERAPY DIAG:  Difficulty in walking, not elsewhere classified  Muscle weakness (generalized)  Chronic pain of right knee  Chronic pain of left knee  Rationale for Evaluation and Treatment Rehabilitation  PERTINENT HISTORY: NA  PRECAUTIONS: None  SUBJECTIVE: I am nervous about being in the pool with other people  PAIN:  Are you having pain? Yes: NPRS scale: 5/10 Pain location: Bil knees RT > Lt Pain description: sore, stiff Aggravating factors: WB Relieving factors: NWB   OBJECTIVE: (objective measures completed at initial evaluation unless otherwise dated)   DIAGNOSTIC FINDINGS: none   PATIENT SURVEYS:  LEFS 10/80, 12.5% self percieved ability, 87.5% self percieved disability FOTO 11 (goal is 40)   COGNITION:           Overall cognitive status: Within functional limits for tasks assessed                          SENSATION: WFL   POSTURE: flexed trunk    PALPATION: Mild crepitus bilateral knees   LOWER EXTREMITY ROM:   WFL   LOWER EXTREMITY MMT:   Generally 4-/5 bilateral LE's   FUNCTIONAL TESTS:  5 times sit to stand: 25.6 sec Timed up and go (TUG): 48.6  sec   GAIT: Distance walked: 50 feet Assistive device utilized: Environmental consultant - 2 wheeled and Wheelchair (manual) Level of assistance: Modified independence Comments: antalgic guarded       TODAY'S TREATMENT:  Pt arrives for aquatic physical therapy. Treatment took place in 3.5-5.5 feet of water. Water temperature was 91 degrees F. Pt entered the pool via stairs: step to step slowly with heavy use of the rails. Pt requires buoyancy of water for support and to offload joints with strengthening exercises.  Seated water bench with 75% submersion Pt performed seated LE AROM exercises 20x in all planes,  75% depth water walking 4x forward, 4x backwards, no extra AD.   Initial eval completed   HOME EXERCISE PROGRAM: Encouraged patient to  continue to try and walk as much as tolerated.     ASSESSMENT:   CLINICAL IMPRESSION: Pt arrives to aquatic PT session 25 min late. Pt was quite nervous about getting in the water and exercising with other patients around. Pt did well once getting in the water of course being limited with time. Pt could water walk with upright posture. No extra knee pain reported.      OBJECTIVE IMPAIRMENTS Abnormal gait, decreased activity tolerance, decreased balance, decreased mobility, difficulty walking, decreased ROM, decreased strength, impaired flexibility, postural dysfunction, obesity, and pain.    ACTIVITY LIMITATIONS carrying, lifting, bending, sitting, standing, squatting, stairs, transfers, bed mobility, bathing, toileting, and dressing   PARTICIPATION LIMITATIONS: meal prep, cleaning, laundry, driving, shopping, community activity, occupation, yard work, and church   [place], Profession, Time since onset of injury/illness/exacerbation, and 1-2 comorbidities: obesity, htn  are also affecting patient's functional outcome.    REHAB POTENTIAL: Fair based on patients size   CLINICAL DECISION MAKING: Evolving/moderate complexity   EVALUATION COMPLEXITY: Moderate     GOALS: Goals reviewed with patient? Yes   SHORT TERM GOALS: Target date: 02/19/2022  Pain report to be no greater than 4/10  Baseline: Goal status: INITIAL   LONG TERM GOALS: Target date: 03/19/2022    Patient to report pain no greater than 2/10  Baseline:  Goal status: INITIAL   2.  Patient to be able to walk 300 feet with rolling walker without knee instability safely Baseline:  Goal status: INITIAL   3.  Patient to be able to do all ADL's independently and safely Baseline:  Goal status: INITIAL   4.  Patient to be able to do basic IADL's independently and safely. Baseline:  Goal status: INITIAL   5.  Patient to report 85% improvement in overall condition and function Baseline:  Goal status:  INITIAL       PLAN: PT FREQUENCY: 1-2x/week   PT DURATION: 8 weeks   PLANNED INTERVENTIONS: Therapeutic exercises, Therapeutic activity, Neuromuscular re-education, Balance training, Gait training, Patient/Family education, Self Care, Joint mobilization, Stair training, Aquatic Therapy, Dry Needling, Electrical stimulation, Cryotherapy, Moist heat, Taping, Vasopneumatic device, Ultrasound, Manual therapy, and Re-evaluation   PLAN FOR NEXT SESSION: Aquatics therapy program    Shannon Kirkendall, PTA 02/03/2022, 8:39 PM

## 2022-02-05 LAB — COLOGUARD: COLOGUARD: POSITIVE — AB

## 2022-02-06 NOTE — Progress Notes (Signed)
Hi Natasha Rollins, please let the patient know that her Cologuard test came back as positive. Thus she will need the colonoscopy procedure. I added her to our inpatient procedure waitlist. We will also need to contact her cardiologist about getting permission to hold her warfarin for 5 days before her procedure. Thanks.

## 2022-02-10 ENCOUNTER — Ambulatory Visit: Payer: BC Managed Care – PPO | Admitting: Physical Therapy

## 2022-02-14 ENCOUNTER — Other Ambulatory Visit: Payer: Self-pay

## 2022-02-14 ENCOUNTER — Telehealth: Payer: Self-pay

## 2022-02-14 DIAGNOSIS — R195 Other fecal abnormalities: Secondary | ICD-10-CM

## 2022-02-14 NOTE — Telephone Encounter (Signed)
Pre-operative Risk Assessment     Request for clearance for an endoscopy procedure;  What type of surgery is being performed?     colonoscopy  When is this surgery scheduled?     04/06/22  What type of clearance is required ?   Pharmacy  Are there any medications that need to be held prior to surgery and how long? Warfarin to be held 5 days prior to procedure  Practice name and name of physician performing surgery?      [facility] by Dr Sharyn Creamer  Anesthesia type (None, local, MAC, general) ?       MAC   Fax response to the attention of Blase Mess, LPN at  [phone]

## 2022-02-15 ENCOUNTER — Ambulatory Visit: Payer: BC Managed Care – PPO | Admitting: Physical Therapy

## 2022-02-16 ENCOUNTER — Ambulatory Visit: Payer: BC Managed Care – PPO

## 2022-02-16 ENCOUNTER — Ambulatory Visit: Payer: BC Managed Care – PPO | Admitting: Podiatry

## 2022-02-16 DIAGNOSIS — S92515D Nondisplaced fracture of proximal phalanx of left lesser toe(s), subsequent encounter for fracture with routine healing: Secondary | ICD-10-CM

## 2022-02-16 DIAGNOSIS — M7751 Other enthesopathy of right foot: Secondary | ICD-10-CM | POA: Diagnosis not present

## 2022-02-16 MED ORDER — TRIAMCINOLONE ACETONIDE 10 MG/ML IJ SUSP
10.0000 mg | Freq: Once | INTRAMUSCULAR | Status: AC
  Administered 2022-02-16: 10 mg

## 2022-02-16 NOTE — Patient Instructions (Signed)
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
## 2022-02-17 ENCOUNTER — Encounter: Payer: Self-pay | Admitting: Physical Therapy

## 2022-02-17 ENCOUNTER — Ambulatory Visit: Payer: BC Managed Care – PPO | Attending: Podiatry | Admitting: Physical Therapy

## 2022-02-17 DIAGNOSIS — M6281 Muscle weakness (generalized): Secondary | ICD-10-CM | POA: Diagnosis present

## 2022-02-17 DIAGNOSIS — G8929 Other chronic pain: Secondary | ICD-10-CM | POA: Diagnosis present

## 2022-02-17 DIAGNOSIS — M25562 Pain in left knee: Secondary | ICD-10-CM | POA: Diagnosis present

## 2022-02-17 DIAGNOSIS — R262 Difficulty in walking, not elsewhere classified: Secondary | ICD-10-CM | POA: Insufficient documentation

## 2022-02-17 DIAGNOSIS — M25561 Pain in right knee: Secondary | ICD-10-CM | POA: Insufficient documentation

## 2022-02-17 NOTE — Therapy (Signed)
OUTPATIENT PHYSICAL THERAPY TREATMENT NOTE   Patient Name: Natasha Rollins MRN: [id number] DOB:Mar 21, 1961, 61 y.o., female Today's Date: 02/17/2022  PCP: Jonathon Jordan, MD  REFERRING PROVIDER: Trula Slade, DPM   END OF SESSION:   PT End of Session - 02/17/22 1355     Visit Number 3    Authorization Type BCBS    Authorization - Visit Number 3    Progress Note Due on Visit 10    PT Start Time 1355   pt late   PT Stop Time 1430    PT Time Calculation (min) 35 min    Activity Tolerance Patient tolerated treatment well    Behavior During Therapy WFL for tasks assessed/performed              Past Medical History:  Diagnosis Date   Diabetes mellitus without complication ([place])    Fat necrosis 05/28/2018   Hidradenitis suppurativa 05/28/2018   Hypertension    Morbid obesity ([place])    Right second toe ulcer ([place]) 05/28/2018   Past Surgical History:  Procedure Laterality Date   CESAREAN SECTION     x2   CHOLECYSTECTOMY     HERNIA REPAIR     TONSILLECTOMY     Patient Active Problem List   Diagnosis Date Noted   Hyperglycemia due to type 2 diabetes mellitus ([place]) 12/28/2020   Hyperlipidemia 12/28/2020   Non-toxic goiter 12/28/2020   Proteinuria 12/28/2020   Thyroid nodule 12/28/2020   Pain in right hand 03/23/2020   Sprain of foot 06/27/2019   Synovial cyst of right popliteal space 05/15/2019   Spoon shaped nails 07/20/2018   Right second toe ulcer ([place]) 05/28/2018   Hidradenitis suppurativa 05/28/2018   Fat necrosis 05/28/2018   Osteoarthritis of knee 04/17/2018   MRSA (methicillin resistant staph aureus) culture positive 03/29/2018   DVT of lower extremity, bilateral ([place]) 02/19/2018   Pain in right foot 12/07/2017   Leukocytosis 06/21/2016   Pulmonary emboli ([place]) 06/20/2016   Diabetes ([place]) 12/22/2012   Hypertension 12/22/2012   Morbid obesity with BMI of 60.0-69.9, adult ([place]) 07/08/2010   OVARIAN CYST 07/08/2010    REFERRING DIAG: Difficulty  in walking, not elsewhere classified    THERAPY DIAG:  Difficulty in walking, not elsewhere classified  Muscle weakness (generalized)  Chronic pain of right knee  Chronic pain of left knee  Rationale for Evaluation and Treatment Rehabilitation  PERTINENT HISTORY: NA  PRECAUTIONS: None  SUBJECTIVE: I felt so good after my water exercise last time. It felt good to walk standing straight up instead of leaning over my walker.   PAIN:  Are you having pain? Yes: NPRS scale: 5/10 Pain location: Bil knees RT > Lt Pain description: sore, stiff Aggravating factors: WB Relieving factors: NWB   OBJECTIVE: (objective measures completed at initial evaluation unless otherwise dated)   DIAGNOSTIC FINDINGS: none   PATIENT SURVEYS:  LEFS 10/80, 12.5% self percieved ability, 87.5% self percieved disability FOTO 11 (goal is 40)   COGNITION:           Overall cognitive status: Within functional limits for tasks assessed                          SENSATION: WFL   POSTURE: flexed trunk    PALPATION: Mild crepitus bilateral knees   LOWER EXTREMITY ROM:   WFL   LOWER EXTREMITY MMT:   Generally 4-/5 bilateral LE's   FUNCTIONAL TESTS:  5 times sit to stand:  25.6 sec Timed up and go (TUG): 48.6 sec   GAIT: Distance walked: 50 feet Assistive device utilized: Environmental consultant - 2 wheeled and Wheelchair (manual) Level of assistance: Modified independence Comments: antalgic guarded       TODAY'S TREATMENT:  02/17/22:Pt arrives for first aquatic physical therapy. Treatment took place in 3.5-5.5 feet of water. Water temperature was 91 degrees F. Pt entered the pool via stairs: step to step slowly with heavy use of the rails. Pt requires buoyancy of water for support and to offload joints with strengthening exercises.  Seated water bench with 75% submersion Pt performed seated LE AROM exercises 20x in all planes,  75% depth water walking 8x forward, 8x backwards,8x sidestepping no extra  AD.  02/03/22: Pt arrives for first aquatic physical therapy. Treatment took place in 3.5-5.5 feet of water. Water temperature was 91 degrees F. Pt entered the pool via stairs: step to step slowly with heavy use of the rails. Pt requires buoyancy of water for support and to offload joints with strengthening exercises.  Seated water bench with 75% submersion Pt performed seated LE AROM exercises 20x in all planes,  75% depth water walking 4x forward, 4x backwards, no extra AD.   Initial eval completed   HOME EXERCISE PROGRAM: Encouraged patient to continue to try and walk as much as tolerated.     ASSESSMENT:   CLINICAL IMPRESSION:  Pt reports feeling very good after her first aquatic PT session. Pt continues to demonstrate the ability to walk upright when in 75% depth water and no AD. Pt doubled her walking lengths today.   OBJECTIVE IMPAIRMENTS Abnormal gait, decreased activity tolerance, decreased balance, decreased mobility, difficulty walking, decreased ROM, decreased strength, impaired flexibility, postural dysfunction, obesity, and pain.    ACTIVITY LIMITATIONS carrying, lifting, bending, sitting, standing, squatting, stairs, transfers, bed mobility, bathing, toileting, and dressing   PARTICIPATION LIMITATIONS: meal prep, cleaning, laundry, driving, shopping, community activity, occupation, yard work, and church   [place], Profession, Time since onset of injury/illness/exacerbation, and 1-2 comorbidities: obesity, htn  are also affecting patient's functional outcome.    REHAB POTENTIAL: Fair based on patients size   CLINICAL DECISION MAKING: Evolving/moderate complexity   EVALUATION COMPLEXITY: Moderate     GOALS: Goals reviewed with patient? Yes   SHORT TERM GOALS: Target date: 02/19/2022  Pain report to be no greater than 4/10  Baseline: Goal status: INITIAL   LONG TERM GOALS: Target date: 03/19/2022    Patient to report pain no greater than 2/10   Baseline:  Goal status: INITIAL   2.  Patient to be able to walk 300 feet with rolling walker without knee instability safely Baseline:  Goal status: INITIAL   3.  Patient to be able to do all ADL's independently and safely Baseline:  Goal status: INITIAL   4.  Patient to be able to do basic IADL's independently and safely. Baseline:  Goal status: INITIAL   5.  Patient to report 85% improvement in overall condition and function Baseline:  Goal status: INITIAL       PLAN: PT FREQUENCY: 1-2x/week   PT DURATION: 8 weeks   PLANNED INTERVENTIONS: Therapeutic exercises, Therapeutic activity, Neuromuscular re-education, Balance training, Gait training, Patient/Family education, Self Care, Joint mobilization, Stair training, Aquatic Therapy, Dry Needling, Electrical stimulation, Cryotherapy, Moist heat, Taping, Vasopneumatic device, Ultrasound, Manual therapy, and Re-evaluation   PLAN FOR NEXT SESSION: Aquatics therapy program    Tyjuan Demetro, PTA 02/17/2022, 8:52 PM

## 2022-02-22 ENCOUNTER — Ambulatory Visit: Payer: BC Managed Care – PPO | Admitting: Physical Therapy

## 2022-02-24 ENCOUNTER — Ambulatory Visit: Payer: BC Managed Care – PPO | Admitting: Physical Therapy

## 2022-03-03 ENCOUNTER — Encounter: Payer: Self-pay | Admitting: Physical Therapy

## 2022-03-03 ENCOUNTER — Ambulatory Visit: Payer: BC Managed Care – PPO | Admitting: Physical Therapy

## 2022-03-03 DIAGNOSIS — R262 Difficulty in walking, not elsewhere classified: Secondary | ICD-10-CM

## 2022-03-03 DIAGNOSIS — M6281 Muscle weakness (generalized): Secondary | ICD-10-CM

## 2022-03-03 DIAGNOSIS — G8929 Other chronic pain: Secondary | ICD-10-CM

## 2022-03-03 NOTE — Therapy (Signed)
OUTPATIENT PHYSICAL THERAPY TREATMENT NOTE   Patient Name: Natasha Rollins MRN: [id number] DOB:May 30, 1960, 61 y.o., female Today's Date: 03/03/2022  PCP: Jonathon Jordan, MD  REFERRING PROVIDER: Trula Slade, DPM   END OF SESSION:   PT End of Session - 03/03/22 2017     Visit Number 4    Date for PT Re-Evaluation 03/17/22    Authorization Type BCBS    Authorization - Visit Number 4    Progress Note Due on Visit 10    PT Start Time 1350    PT Stop Time 1430    PT Time Calculation (min) 40 min    Activity Tolerance Patient tolerated treatment well    Behavior During Therapy WFL for tasks assessed/performed               Past Medical History:  Diagnosis Date   Diabetes mellitus without complication ([place])    Fat necrosis 05/28/2018   Hidradenitis suppurativa 05/28/2018   Hypertension    Morbid obesity ([place])    Right second toe ulcer ([employer]) 05/28/2018   Past Surgical History:  Procedure Laterality Date   CESAREAN SECTION     x2   CHOLECYSTECTOMY     HERNIA REPAIR     TONSILLECTOMY     Patient Active Problem List   Diagnosis Date Noted   Hyperglycemia due to type 2 diabetes mellitus ([place]) 12/28/2020   Hyperlipidemia 12/28/2020   Non-toxic goiter 12/28/2020   Proteinuria 12/28/2020   Thyroid nodule 12/28/2020   Pain in right hand 03/23/2020   Sprain of foot 06/27/2019   Synovial cyst of right popliteal space 05/15/2019   Spoon shaped nails 07/20/2018   Right second toe ulcer ([place]) 05/28/2018   Hidradenitis suppurativa 05/28/2018   Fat necrosis 05/28/2018   Osteoarthritis of knee 04/17/2018   MRSA (methicillin resistant staph aureus) culture positive 03/29/2018   DVT of lower extremity, bilateral ([place]) 02/19/2018   Pain in right foot 12/07/2017   Leukocytosis 06/21/2016   Pulmonary emboli ([place]) 06/20/2016   Diabetes ([place]) 12/22/2012   Hypertension 12/22/2012   Morbid obesity with BMI of 60.0-69.9, adult ([place]) 07/08/2010   OVARIAN CYST 07/08/2010     REFERRING DIAG: Difficulty in walking, not elsewhere classified    THERAPY DIAG:  Difficulty in walking, not elsewhere classified  Muscle weakness (generalized)  Chronic pain of right knee  Chronic pain of left knee  Rationale for Evaluation and Treatment Rehabilitation  PERTINENT HISTORY: NA  PRECAUTIONS: None  SUBJECTIVE: I felt so good after my water exercise last time. It felt good to walk standing straight up instead of leaning over my walker.   PAIN:  Are you having pain? Yes: NPRS scale: 5/10 Pain location: Bil knees RT > Lt Pain description: sore, stiff Aggravating factors: WB Relieving factors: NWB   OBJECTIVE: (objective measures completed at initial evaluation unless otherwise dated)   DIAGNOSTIC FINDINGS: none   PATIENT SURVEYS:  LEFS 10/80, 12.5% self percieved ability, 87.5% self percieved disability FOTO 11 (goal is 40)   COGNITION:           Overall cognitive status: Within functional limits for tasks assessed                          SENSATION: WFL   POSTURE: flexed trunk    PALPATION: Mild crepitus bilateral knees   LOWER EXTREMITY ROM:   WFL   LOWER EXTREMITY MMT:   Generally 4-/5 bilateral LE's   FUNCTIONAL TESTS:  5 times sit to stand: 25.6 sec Timed up and go (TUG): 48.6 sec   GAIT: Distance walked: 50 feet Assistive device utilized: Environmental consultant - 2 wheeled and Wheelchair (manual) Level of assistance: Modified independence Comments: antalgic guarded       TODAY'S TREATMENT:   03/03/22:Pt arrives for first aquatic physical therapy. Treatment took place in 3.5-5.5 feet of water. Water temperature was 91 degrees F. Pt entered the pool via stairs: step to step slowly with heavy use of the rails. Pt requires buoyancy of water for support and to offload joints with strengthening exercises.  Seated water bench with 75% submersion Pt performed seated LE AROM exercises 20x in all planes,  75% depth water walking 10x forward, 10x  backwards,10x sidestepping no extra AD. Seated RTLE hamstring stretch 3x 10 sec, ankle pumps 20x   02/17/22:Pt arrives for first aquatic physical therapy. Treatment took place in 3.5-5.5 feet of water. Water temperature was 91 degrees F. Pt entered the pool via stairs: step to step slowly with heavy use of the rails. Pt requires buoyancy of water for support and to offload joints with strengthening exercises.  Seated water bench with 75% submersion Pt performed seated LE AROM exercises 20x in all planes,  75% depth water walking 8x forward, 8x backwards,8x sidestepping no extra AD.  02/03/22: Pt arrives for first aquatic physical therapy. Treatment took place in 3.5-5.5 feet of water. Water temperature was 91 degrees F. Pt entered the pool via stairs: step to step slowly with heavy use of the rails. Pt requires buoyancy of water for support and to offload joints with strengthening exercises.  Seated water bench with 75% submersion Pt performed seated LE AROM exercises 20x in all planes,  75% depth water walking 4x forward, 4x backwards, no extra AD.   Initial eval completed   HOME EXERCISE PROGRAM: Encouraged patient to continue to try and walk as much as tolerated.     ASSESSMENT:   CLINICAL IMPRESSION:  Pt reports having no adverse effects after water exercise and reports no pain increases while exercising in the water. Pt continues to increase volume of walking each session.    OBJECTIVE IMPAIRMENTS Abnormal gait, decreased activity tolerance, decreased balance, decreased mobility, difficulty walking, decreased ROM, decreased strength, impaired flexibility, postural dysfunction, obesity, and pain.    ACTIVITY LIMITATIONS carrying, lifting, bending, sitting, standing, squatting, stairs, transfers, bed mobility, bathing, toileting, and dressing   PARTICIPATION LIMITATIONS: meal prep, cleaning, laundry, driving, shopping, community activity, occupation, yard work, and church   [place], Profession, Time since onset of injury/illness/exacerbation, and 1-2 comorbidities: obesity, htn  are also affecting patient's functional outcome.    REHAB POTENTIAL: Fair based on patients size   CLINICAL DECISION MAKING: Evolving/moderate complexity   EVALUATION COMPLEXITY: Moderate     GOALS: Goals reviewed with patient? Yes   SHORT TERM GOALS: Target date: 02/19/2022  Pain report to be no greater than 4/10  Baseline: Goal status: INITIAL   LONG TERM GOALS: Target date: 03/19/2022    Patient to report pain no greater than 2/10  Baseline:  Goal status: INITIAL   2.  Patient to be able to walk 300 feet with rolling walker without knee instability safely Baseline:  Goal status: INITIAL   3.  Patient to be able to do all ADL's independently and safely Baseline:  Goal status: INITIAL   4.  Patient to be able to do basic IADL's independently and safely. Baseline:  Goal status: INITIAL   5.  Patient to report 85% improvement in overall condition and function Baseline:  Goal status: INITIAL       PLAN: PT FREQUENCY: 1-2x/week   PT DURATION: 8 weeks   PLANNED INTERVENTIONS: Therapeutic exercises, Therapeutic activity, Neuromuscular re-education, Balance training, Gait training, Patient/Family education, Self Care, Joint mobilization, Stair training, Aquatic Therapy, Dry Needling, Electrical stimulation, Cryotherapy, Moist heat, Taping, Vasopneumatic device, Ultrasound, Manual therapy, and Re-evaluation   PLAN FOR NEXT SESSION: Aquatics therapy program    [place]ona [place], PTA 03/03/2022, 8:19 PM

## 2022-03-07 ENCOUNTER — Ambulatory Visit: Payer: BC Managed Care – PPO

## 2022-03-07 NOTE — Therapy (Incomplete)
OUTPATIENT PHYSICAL THERAPY TREATMENT NOTE  Progress Note Reporting Period 01/20/22 to 03/08/22  See note below for Objective Data and Assessment of Progress/Goals.     Patient Name: Natasha Rollins MRN: [id number] DOB:December 18, 1960, 61 y.o., female Today's Date: 03/07/2022  PCP: Jonathon Jordan, MD  REFERRING PROVIDER: Trula Slade, DPM   END OF SESSION:       Past Medical History:  Diagnosis Date   Diabetes mellitus without complication ([place])    Fat necrosis 05/28/2018   Hidradenitis suppurativa 05/28/2018   Hypertension    Morbid obesity ([place])    Right second toe ulcer ([place]) 05/28/2018   Past Surgical History:  Procedure Laterality Date   CESAREAN SECTION     x2   CHOLECYSTECTOMY     HERNIA REPAIR     TONSILLECTOMY     Patient Active Problem List   Diagnosis Date Noted   Hyperglycemia due to type 2 diabetes mellitus ([place]) 12/28/2020   Hyperlipidemia 12/28/2020   Non-toxic goiter 12/28/2020   Proteinuria 12/28/2020   Thyroid nodule 12/28/2020   Pain in right hand 03/23/2020   Sprain of foot 06/27/2019   Synovial cyst of right popliteal space 05/15/2019   Spoon shaped nails 07/20/2018   Right second toe ulcer ([place]) 05/28/2018   Hidradenitis suppurativa 05/28/2018   Fat necrosis 05/28/2018   Osteoarthritis of knee 04/17/2018   MRSA (methicillin resistant staph aureus) culture positive 03/29/2018   DVT of lower extremity, bilateral ([place]) 02/19/2018   Pain in right foot 12/07/2017   Leukocytosis 06/21/2016   Pulmonary emboli ([place]) 06/20/2016   Diabetes ([place]) 12/22/2012   Hypertension 12/22/2012   Morbid obesity with BMI of 60.0-69.9, adult ([place]) 07/08/2010   OVARIAN CYST 07/08/2010    REFERRING DIAG: Difficulty in walking, not elsewhere classified    THERAPY DIAG:  No diagnosis found.  Rationale for Evaluation and Treatment Rehabilitation  PERTINENT HISTORY: NA  PRECAUTIONS: None  SUBJECTIVE: I felt so good after my water exercise last time.  It felt good to walk standing straight up instead of leaning over my walker.   PAIN:  Are you having pain? Yes: NPRS scale: 5/10 Pain location: Bil knees RT > Lt Pain description: sore, stiff Aggravating factors: WB Relieving factors: NWB   OBJECTIVE: (objective measures completed at initial evaluation unless otherwise dated)   DIAGNOSTIC FINDINGS: none   PATIENT SURVEYS:  LEFS 10/80, 12.5% self percieved ability, 87.5% self percieved disability FOTO 11 (goal is 40)   COGNITION:           Overall cognitive status: Within functional limits for tasks assessed                          SENSATION: WFL   POSTURE: flexed trunk    PALPATION: Mild crepitus bilateral knees   LOWER EXTREMITY ROM:   WFL   LOWER EXTREMITY MMT:   Generally 4-/5 bilateral LE's   FUNCTIONAL TESTS:  5 times sit to stand: 25.6 sec Timed up and go (TUG): 48.6 sec  03/08/22: 5X STS test    TUG:   GAIT: Distance walked: 50 feet Assistive device utilized: Environmental consultant - 2 wheeled and Wheelchair (manual) Level of assistance: Modified independence Comments: antalgic guarded       TODAY'S TREATMENT:   03/03/22:Pt arrives for first aquatic physical therapy. Treatment took place in 3.5-5.5 feet of water. Water temperature was 91 degrees F. Pt entered the pool via stairs: step to step slowly with heavy use of the  rails. Pt requires buoyancy of water for support and to offload joints with strengthening exercises.  Seated water bench with 75% submersion Pt performed seated LE AROM exercises 20x in all planes,  75% depth water walking 10x forward, 10x backwards,10x sidestepping no extra AD. Seated RTLE hamstring stretch 3x 10 sec, ankle pumps 20x   02/17/22:Pt arrives for first aquatic physical therapy. Treatment took place in 3.5-5.5 feet of water. Water temperature was 91 degrees F. Pt entered the pool via stairs: step to step slowly with heavy use of the rails. Pt requires buoyancy of water for support and  to offload joints with strengthening exercises.  Seated water bench with 75% submersion Pt performed seated LE AROM exercises 20x in all planes,  75% depth water walking 8x forward, 8x backwards,8x sidestepping no extra AD.  02/03/22: Pt arrives for first aquatic physical therapy. Treatment took place in 3.5-5.5 feet of water. Water temperature was 91 degrees F. Pt entered the pool via stairs: step to step slowly with heavy use of the rails. Pt requires buoyancy of water for support and to offload joints with strengthening exercises.  Seated water bench with 75% submersion Pt performed seated LE AROM exercises 20x in all planes,  75% depth water walking 4x forward, 4x backwards, no extra AD.   Initial eval completed   HOME EXERCISE PROGRAM: Encouraged patient to continue to try and walk as much as tolerated.     ASSESSMENT:   CLINICAL IMPRESSION:  PN:Pt has requested to be transferred to [facility] from [facility].  Re-assessment completed today.  She has only been seen x 4 visit since initiation of services due to cancellations for mobility and transportation issues. No goals have yet to be met ST or LT.   OBJECTIVE IMPAIRMENTS Abnormal gait, decreased activity tolerance, decreased balance, decreased mobility, difficulty walking, decreased ROM, decreased strength, impaired flexibility, postural dysfunction, obesity, and pain.    ACTIVITY LIMITATIONS carrying, lifting, bending, sitting, standing, squatting, stairs, transfers, bed mobility, bathing, toileting, and dressing   PARTICIPATION LIMITATIONS: meal prep, cleaning, laundry, driving, shopping, community activity, occupation, yard work, and church   [place], Profession, Time since onset of injury/illness/exacerbation, and 1-2 comorbidities: obesity, htn  are also affecting patient's functional outcome.    REHAB POTENTIAL: Fair based on patients size   CLINICAL DECISION MAKING: Evolving/moderate complexity    EVALUATION COMPLEXITY: Moderate     GOALS: Goals reviewed with patient? Yes   SHORT TERM GOALS: Target date: 02/19/2022  Pain report to be no greater than 4/10  Baseline: Goal status: INITIAL   LONG TERM GOALS: Target date: 03/19/2022    Patient to report pain no greater than 2/10  Baseline:  Goal status: INITIAL   2.  Patient to be able to walk 300 feet with rolling walker without knee instability safely Baseline:  Goal status: INITIAL   3.  Patient to be able to do all ADL's independently and safely Baseline:  Goal status: INITIAL   4.  Patient to be able to do basic IADL's independently and safely. Baseline:  Goal status: INITIAL   5.  Patient to report 85% improvement in overall condition and function Baseline:  Goal status: INITIAL       PLAN: PT FREQUENCY: 1-2x/week   PT DURATION: 8 weeks   PLANNED INTERVENTIONS: Therapeutic exercises, Therapeutic activity, Neuromuscular re-education, Balance training, Gait training, Patient/Family education, Self Care, Joint mobilization, Stair training, Aquatic Therapy, Dry Needling, Electrical stimulation, Cryotherapy, Moist heat, Taping, Vasopneumatic device, Ultrasound, Manual therapy, and  Re-evaluation   PLAN FOR NEXT SESSION: Aquatics therapy program    [place], [place] 03/07/2022, 6:34 PM

## 2022-03-08 ENCOUNTER — Encounter (HOSPITAL_BASED_OUTPATIENT_CLINIC_OR_DEPARTMENT_OTHER): Payer: Self-pay | Admitting: Physical Therapy

## 2022-03-08 ENCOUNTER — Ambulatory Visit (HOSPITAL_BASED_OUTPATIENT_CLINIC_OR_DEPARTMENT_OTHER): Payer: BC Managed Care – PPO | Attending: Endocrinology | Admitting: Physical Therapy

## 2022-03-08 DIAGNOSIS — G8929 Other chronic pain: Secondary | ICD-10-CM | POA: Insufficient documentation

## 2022-03-08 DIAGNOSIS — M25561 Pain in right knee: Secondary | ICD-10-CM | POA: Insufficient documentation

## 2022-03-08 DIAGNOSIS — M25562 Pain in left knee: Secondary | ICD-10-CM | POA: Insufficient documentation

## 2022-03-08 DIAGNOSIS — M6281 Muscle weakness (generalized): Secondary | ICD-10-CM | POA: Insufficient documentation

## 2022-03-08 DIAGNOSIS — R262 Difficulty in walking, not elsewhere classified: Secondary | ICD-10-CM | POA: Insufficient documentation

## 2022-03-09 ENCOUNTER — Encounter (HOSPITAL_BASED_OUTPATIENT_CLINIC_OR_DEPARTMENT_OTHER): Payer: Self-pay | Admitting: Physical Therapy

## 2022-03-09 NOTE — Therapy (Signed)
Pt late for visit. There is confusion between pt and therapist in regards to switching clinics; purpose/intent and pt needs. No treatment given.  Stuation cleared up and pt scheduled for future visits. Recert to be completed next visit.

## 2022-03-22 ENCOUNTER — Ambulatory Visit: Payer: BC Managed Care – PPO | Attending: Podiatry

## 2022-03-22 DIAGNOSIS — R262 Difficulty in walking, not elsewhere classified: Secondary | ICD-10-CM | POA: Insufficient documentation

## 2022-03-22 DIAGNOSIS — M25561 Pain in right knee: Secondary | ICD-10-CM | POA: Insufficient documentation

## 2022-03-22 DIAGNOSIS — M25562 Pain in left knee: Secondary | ICD-10-CM | POA: Diagnosis present

## 2022-03-22 DIAGNOSIS — M6281 Muscle weakness (generalized): Secondary | ICD-10-CM | POA: Diagnosis not present

## 2022-03-22 DIAGNOSIS — G8929 Other chronic pain: Secondary | ICD-10-CM | POA: Insufficient documentation

## 2022-03-22 NOTE — Therapy (Signed)
OUTPATIENT PHYSICAL THERAPY TREATMENT NOTE   Patient Name: Shaneisha Burkel MRN: [id number] DOB:08-Mar-1961, 61 y.o., female Today's Date: 03/22/2022  PCP: Jonathon Jordan, MD  REFERRING PROVIDER: Trula Slade, DPM   END OF SESSION:   PT End of Session - 03/22/22 1606     Visit Number 5    Date for PT Re-Evaluation 05/12/22    Authorization Type BCBS    Authorization - Visit Number 5    Progress Note Due on Visit 15    PT Start Time 1583    PT Stop Time 1610    PT Time Calculation (min) 40 min    Activity Tolerance Patient tolerated treatment well    Behavior During Therapy [facility] for tasks assessed/performed               Past Medical History:  Diagnosis Date   Diabetes mellitus without complication ([place])    Fat necrosis 05/28/2018   Hidradenitis suppurativa 05/28/2018   Hypertension    Morbid obesity ([place])    Right second toe ulcer ([place]) 05/28/2018   Past Surgical History:  Procedure Laterality Date   CESAREAN SECTION     x2   CHOLECYSTECTOMY     HERNIA REPAIR     TONSILLECTOMY     Patient Active Problem List   Diagnosis Date Noted   Hyperglycemia due to type 2 diabetes mellitus ([place]) 12/28/2020   Hyperlipidemia 12/28/2020   Non-toxic goiter 12/28/2020   Proteinuria 12/28/2020   Thyroid nodule 12/28/2020   Pain in right hand 03/23/2020   Sprain of foot 06/27/2019   Synovial cyst of right popliteal space 05/15/2019   Spoon shaped nails 07/20/2018   Right second toe ulcer ([place]) 05/28/2018   Hidradenitis suppurativa 05/28/2018   Fat necrosis 05/28/2018   Osteoarthritis of knee 04/17/2018   MRSA (methicillin resistant staph aureus) culture positive 03/29/2018   DVT of lower extremity, bilateral ([place]) 02/19/2018   Pain in right foot 12/07/2017   Leukocytosis 06/21/2016   Pulmonary emboli ([place]) 06/20/2016   Diabetes ([place]) 12/22/2012   Hypertension 12/22/2012   Morbid obesity with BMI of 60.0-69.9, adult ([place]) 07/08/2010   OVARIAN CYST 07/08/2010     REFERRING DIAG: Difficulty in walking, not elsewhere classified    THERAPY DIAG:  Muscle weakness (generalized)  Difficulty in walking, not elsewhere classified  Chronic pain of right knee  Chronic pain of left knee  Rationale for Evaluation and Treatment Rehabilitation  PERTINENT HISTORY: NA  PRECAUTIONS: None  SUBJECTIVE: Patient states she is doing so good in the water.  She is able to walk and move around with such ease and is able to do so many things that she cannot outside of the water.  She explains that she has increased ease with sit to stand and walking to the bathroom with her walker.  She is working on trying not to lean on her walker and finds that she is able to be upright most of the time.  She states her pain is minimal unless she has had to be up for longer than a few minutes.     PAIN:  Are you having pain? Yes: NPRS scale: 5/10 Pain location: Bil knees RT > Lt Pain description: sore, stiff Aggravating factors: WB Relieving factors: NWB   OBJECTIVE: (objective measures completed at initial evaluation unless otherwise dated)   DIAGNOSTIC FINDINGS: none   PATIENT SURVEYS:  Initial eval: LEFS 10/80, 12.5% self percieved ability, 87.5% self percieved disability FOTO 11 (goal is 40) 03/22/22:  LEFS  17/80, 21% self perceived ability, 79% self perceived disability  FOTO : 26 COGNITION:           Overall cognitive status: Within functional limits for tasks assessed                          SENSATION: WFL   POSTURE: flexed trunk    PALPATION: Mild crepitus bilateral knees   LOWER EXTREMITY ROM:   WFL   LOWER EXTREMITY MMT:   Generally 4- to 4/5 bilateral LE's   FUNCTIONAL TESTS:  Initial eval: 5 times sit to stand: 25.6 sec (patient could not come to full stand so these reps were not full stand) Timed up and go (TUG): 48.6 sec (leaning fwd on forearms)  03/22/22: 5 times sit to stand: 30.84 sec (patient coming to full stand with upright  posture) Timed up and go (TUG): 33.73 sec (Patient upright and not leaning on elbows)   GAIT: Distance walked: 50 feet Assistive device utilized: Environmental consultant - 2 wheeled and Wheelchair (manual) Level of assistance: Modified independence Comments: antalgic guarded       TODAY'S TREATMENT:  03/22/22: Re-assessment and updated POC (recert)   20/94/70:JG arrives for first aquatic physical therapy. Treatment took place in 3.5-5.5 feet of water. Water temperature was 91 degrees F. Pt entered the pool via stairs: step to step slowly with heavy use of the rails. Pt requires buoyancy of water for support and to offload joints with strengthening exercises.  Seated water bench with 75% submersion Pt performed seated LE AROM exercises 20x in all planes,  75% depth water walking 10x forward, 10x backwards,10x sidestepping no extra AD. Seated RTLE hamstring stretch 3x 10 sec, ankle pumps 20x   02/17/22:Pt arrives for first aquatic physical therapy. Treatment took place in 3.5-5.5 feet of water. Water temperature was 91 degrees F. Pt entered the pool via stairs: step to step slowly with heavy use of the rails. Pt requires buoyancy of water for support and to offload joints with strengthening exercises.  Seated water bench with 75% submersion Pt performed seated LE AROM exercises 20x in all planes,  75% depth water walking 8x forward, 8x backwards,8x sidestepping no extra AD.  02/03/22: Pt arrives for first aquatic physical therapy. Treatment took place in 3.5-5.5 feet of water. Water temperature was 91 degrees F. Pt entered the pool via stairs: step to step slowly with heavy use of the rails. Pt requires buoyancy of water for support and to offload joints with strengthening exercises.  Seated water bench with 75% submersion Pt performed seated LE AROM exercises 20x in all planes,  75% depth water walking 4x forward, 4x backwards, no extra AD.   Initial eval completed   HOME EXERCISE  PROGRAM: Encouraged patient to continue to try and walk as much as tolerated.     ASSESSMENT:   CLINICAL IMPRESSION:  Gearldene is making steady functional progress. Her objective findings and functional tests are all improved.  She is well motivated to continue to work hard to achieve her goals.  She is ambulating with increased ease with upright posture vs leaning on her elbows on her walker.  She is able to access the bathroom at home independently and safely.  She should continue to improve and would benefit form continued skilled PT for aquatics therapy.      OBJECTIVE IMPAIRMENTS Abnormal gait, decreased activity tolerance, decreased balance, decreased mobility, difficulty walking, decreased ROM, decreased strength, impaired flexibility, postural dysfunction, obesity, and  pain.    ACTIVITY LIMITATIONS carrying, lifting, bending, sitting, standing, squatting, stairs, transfers, bed mobility, bathing, toileting, and dressing   PARTICIPATION LIMITATIONS: meal prep, cleaning, laundry, driving, shopping, community activity, occupation, yard work, and church   [place], Profession, Time since onset of injury/illness/exacerbation, and 1-2 comorbidities: obesity, htn  are also affecting patient's functional outcome.    REHAB POTENTIAL: Fair based on patients size   CLINICAL DECISION MAKING: Evolving/moderate complexity   EVALUATION COMPLEXITY: Moderate     GOALS: Goals reviewed with patient? Yes   SHORT TERM GOALS: Target date: 02/19/2022  Pain report to be no greater than 4/10  Baseline: Goal status: MET   LONG TERM GOALS: Target date: 03/19/2022    Patient to report pain no greater than 2/10  Baseline:  Goal status: INITIAL   2.  Patient to be able to walk 300 feet with rolling walker without knee instability safely Baseline:  Goal status: INITIAL   3.  Patient to be able to do all ADL's independently and safely Baseline:  Goal status: INITIAL   4.   Patient to be able to do basic IADL's independently and safely. Baseline:  Goal status: INITIAL   5.  Patient to report 85% improvement in overall condition and function Baseline:  Goal status: INITIAL       PLAN: PT FREQUENCY: 1-2x/week   PT DURATION: 8 weeks   PLANNED INTERVENTIONS: Therapeutic exercises, Therapeutic activity, Neuromuscular re-education, Balance training, Gait training, Patient/Family education, Self Care, Joint mobilization, Stair training, Aquatic Therapy, Dry Needling, Electrical stimulation, Cryotherapy, Moist heat, Taping, Vasopneumatic device, Ultrasound, Manual therapy, and Re-evaluation   PLAN FOR NEXT SESSION: Aquatics therapy program    [place] B. Jermanie Minshall, PT 03/22/22 11:15 PM

## 2022-03-23 ENCOUNTER — Ambulatory Visit (HOSPITAL_BASED_OUTPATIENT_CLINIC_OR_DEPARTMENT_OTHER): Payer: BC Managed Care – PPO | Admitting: Physical Therapy

## 2022-03-23 ENCOUNTER — Encounter (HOSPITAL_BASED_OUTPATIENT_CLINIC_OR_DEPARTMENT_OTHER): Payer: Self-pay | Admitting: Physical Therapy

## 2022-03-23 ENCOUNTER — Other Ambulatory Visit: Payer: Self-pay

## 2022-03-23 DIAGNOSIS — R262 Difficulty in walking, not elsewhere classified: Secondary | ICD-10-CM | POA: Diagnosis not present

## 2022-03-23 DIAGNOSIS — G8929 Other chronic pain: Secondary | ICD-10-CM

## 2022-03-23 DIAGNOSIS — M25561 Pain in right knee: Secondary | ICD-10-CM | POA: Diagnosis present

## 2022-03-23 DIAGNOSIS — M6281 Muscle weakness (generalized): Secondary | ICD-10-CM

## 2022-03-23 DIAGNOSIS — M25562 Pain in left knee: Secondary | ICD-10-CM | POA: Diagnosis present

## 2022-03-23 MED ORDER — BISACODYL 5 MG PO TBEC: DELAYED_RELEASE_TABLET | ORAL | 0 refills | Status: DC

## 2022-03-23 MED ORDER — COLYTE WITH FLAVOR PACKS 240 G PO SOLR: ORAL | 0 refills | Status: DC

## 2022-03-23 NOTE — Therapy (Signed)
OUTPATIENT PHYSICAL THERAPY TREATMENT NOTE   Patient Name: Natasha Rollins MRN: [id number] DOB:Jan 02, 1961, 61 y.o., female Today's Date: 03/23/2022  PCP: Jonathon Jordan, MD  REFERRING PROVIDER: Trula Slade, DPM   END OF SESSION:   PT End of Session - 03/23/22 1818     Visit Number 6    Date for PT Re-Evaluation 05/12/22    Authorization Type BCBS    Progress Note Due on Visit 15    PT Start Time 1704    PT Stop Time 3212    PT Time Calculation (min) 41 min    Activity Tolerance Patient tolerated treatment well    Behavior During Therapy [facility] for tasks assessed/performed               Past Medical History:  Diagnosis Date   Diabetes mellitus without complication ([place])    Fat necrosis 05/28/2018   Hidradenitis suppurativa 05/28/2018   Hypertension    Morbid obesity ([place])    Right second toe ulcer ([place]) 05/28/2018   Past Surgical History:  Procedure Laterality Date   CESAREAN SECTION     x2   CHOLECYSTECTOMY     HERNIA REPAIR     TONSILLECTOMY     Patient Active Problem List   Diagnosis Date Noted   Hyperglycemia due to type 2 diabetes mellitus ([place]) 12/28/2020   Hyperlipidemia 12/28/2020   Non-toxic goiter 12/28/2020   Proteinuria 12/28/2020   Thyroid nodule 12/28/2020   Pain in right hand 03/23/2020   Sprain of foot 06/27/2019   Synovial cyst of right popliteal space 05/15/2019   Spoon shaped nails 07/20/2018   Right second toe ulcer ([place]) 05/28/2018   Hidradenitis suppurativa 05/28/2018   Fat necrosis 05/28/2018   Osteoarthritis of knee 04/17/2018   MRSA (methicillin resistant staph aureus) culture positive 03/29/2018   DVT of lower extremity, bilateral ([place]) 02/19/2018   Pain in right foot 12/07/2017   Leukocytosis 06/21/2016   Pulmonary emboli ([place]) 06/20/2016   Diabetes ([place]) 12/22/2012   Hypertension 12/22/2012   Morbid obesity with BMI of 60.0-69.9, adult ([place]) 07/08/2010   OVARIAN CYST 07/08/2010    REFERRING DIAG: Difficulty in  walking, not elsewhere classified    THERAPY DIAG:  Muscle weakness (generalized)  Difficulty in walking, not elsewhere classified  Chronic pain of right knee  Chronic pain of left knee  Rationale for Evaluation and Treatment Rehabilitation  PERTINENT HISTORY: NA  PRECAUTIONS: None  SUBJECTIVE: Patient states she Is feeling good  PAIN:  Are you having pain? Yes: NPRS scale: 4/10 Pain location: Bil knees RT > Lt Pain description: sore, stiff Aggravating factors: WB Relieving factors: NWB   OBJECTIVE: (objective measures completed at initial evaluation unless otherwise dated)   DIAGNOSTIC FINDINGS: none   PATIENT SURVEYS:  Initial eval: LEFS 10/80, 12.5% self percieved ability, 87.5% self percieved disability FOTO 11 (goal is 40) 03/22/22:  LEFS 17/80, 21% self perceived ability, 79% self perceived disability  FOTO : 26 COGNITION:           Overall cognitive status: Within functional limits for tasks assessed                          SENSATION: WFL   POSTURE: flexed trunk    PALPATION: Mild crepitus bilateral knees   LOWER EXTREMITY ROM:   WFL   LOWER EXTREMITY MMT:   Generally 4- to 4/5 bilateral LE's   FUNCTIONAL TESTS:  Initial eval: 5 times sit to stand: 25.6  sec (patient could not come to full stand so these reps were not full stand) Timed up and go (TUG): 48.6 sec (leaning fwd on forearms)  03/22/22: 5 times sit to stand: 30.84 sec (patient coming to full stand with upright posture) Timed up and go (TUG): 33.73 sec (Patient upright and not leaning on elbows)   GAIT: Distance walked: 50 feet Assistive device utilized: Environmental consultant - 2 wheeled and Wheelchair (manual) Level of assistance: Modified independence Comments: antalgic guarded       TODAY'S TREATMENT:   03/23/22:Pt arrives for aquatic physical therapy. Treatment took place in 3.5-5.5 feet of water. Water temperature was 92 degrees F. Pt entered the pool via stairs: step to step slowly  with heavy use of the rails. Pt requires buoyancy of water for support and to offload joints with strengthening exercises.  Seated water bench with 75% submersion Pt performed seated LE AROM exercises 20x in all planes, STS 2x5 then x3 onto water step from bench decreasing UE support to without (x3). Cues for weight shift and gaining immediate standing balance 75% depth water walking 10 forward and 2 backward initially without UE support then with white barbell to decrease LB discomfort. Gait training: cues for heel strike, toe off.  Standing LB stretch/ modified hip hinge against wall 5 x 10s hold. Gentle lumbar rotation x 5 Min assist with stair climbing ascending, cues for improved technique. Heavy ue use pulling up from hand rails onto step with extended knee.    03/22/22: Re-assessment and updated POC (recert)   78/29/56:OZ arrives for first aquatic physical therapy. Treatment took place in 3.5-5.5 feet of water. Water temperature was 91 degrees F. Pt entered the pool via stairs: step to step slowly with heavy use of the rails. Pt requires buoyancy of water for support and to offload joints with strengthening exercises.  Seated water bench with 75% submersion Pt performed seated LE AROM exercises 20x in all planes,  75% depth water walking 10x forward, 10x backwards,10x sidestepping no extra AD. Seated RTLE hamstring stretch 3x 10 sec, ankle pumps 20x   02/17/22:Pt arrives for first aquatic physical therapy. Treatment took place in 3.5-5.5 feet of water. Water temperature was 91 degrees F. Pt entered the pool via stairs: step to step slowly with heavy use of the rails. Pt requires buoyancy of water for support and to offload joints with strengthening exercises.  Seated water bench with 75% submersion Pt performed seated LE AROM exercises 20x in all planes,  75% depth water walking 8x forward, 8x backwards,8x sidestepping no extra AD.  02/03/22: Pt arrives for first aquatic physical  therapy. Treatment took place in 3.5-5.5 feet of water. Water temperature was 91 degrees F. Pt entered the pool via stairs: step to step slowly with heavy use of the rails. Pt requires buoyancy of water for support and to offload joints with strengthening exercises.  Seated water bench with 75% submersion Pt performed seated LE AROM exercises 20x in all planes,  75% depth water walking 4x forward, 4x backwards, no extra AD.   Initial eval completed   HOME EXERCISE PROGRAM: Encouraged patient to continue to try and walk as much as tolerated.     ASSESSMENT:   CLINICAL IMPRESSION:  Pt requiring cues for knee flex with gait submerged as initially she completes by circumduction of bilat hips. Gait pattern gradually improves over time in session and she tolerates upright position with less LB sx using white barbell. She requires heavy UE use to negotiate stairs  descending side ways ascending leading with lle without knee flex with each step. Pt given vc for improved breathing pattern while attempting TrA contraction. She has difficulty with volitional engagement of muscle. Her overall pain reduces throughout session but does increase slightly with effort to exit pool. She is very motivated and reports good response from aquatic therapy intervention up to this point.     OBJECTIVE IMPAIRMENTS Abnormal gait, decreased activity tolerance, decreased balance, decreased mobility, difficulty walking, decreased ROM, decreased strength, impaired flexibility, postural dysfunction, obesity, and pain.    ACTIVITY LIMITATIONS carrying, lifting, bending, sitting, standing, squatting, stairs, transfers, bed mobility, bathing, toileting, and dressing   PARTICIPATION LIMITATIONS: meal prep, cleaning, laundry, driving, shopping, community activity, occupation, yard work, and church   [place], Profession, Time since onset of injury/illness/exacerbation, and 1-2 comorbidities: obesity, htn  are also  affecting patient's functional outcome.    REHAB POTENTIAL: Fair based on patients size   CLINICAL DECISION MAKING: Evolving/moderate complexity   EVALUATION COMPLEXITY: Moderate     GOALS: Goals reviewed with patient? Yes   SHORT TERM GOALS: Target date: 02/19/2022  Pain report to be no greater than 4/10  Baseline: Goal status: MET   LONG TERM GOALS: Target date: 03/19/2022    Patient to report pain no greater than 2/10  Baseline:  Goal status: INITIAL   2.  Patient to be able to walk 300 feet with rolling walker without knee instability safely Baseline:  Goal status: INITIAL   3.  Patient to be able to do all ADL's independently and safely Baseline:  Goal status: INITIAL   4.  Patient to be able to do basic IADL's independently and safely. Baseline:  Goal status: INITIAL   5.  Patient to report 85% improvement in overall condition and function Baseline:  Goal status: INITIAL       PLAN: PT FREQUENCY: 1-2x/week   PT DURATION: 8 weeks   PLANNED INTERVENTIONS: Therapeutic exercises, Therapeutic activity, Neuromuscular re-education, Balance training, Gait training, Patient/Family education, Self Care, Joint mobilization, Stair training, Aquatic Therapy, Dry Needling, Electrical stimulation, Cryotherapy, Moist heat, Taping, Vasopneumatic device, Ultrasound, Manual therapy, and Re-evaluation   PLAN FOR NEXT SESSION: Aquatics therapy program    Annamarie Major) Mykira Hofmeister MPT 03/23/22 6:21 PM

## 2022-03-23 NOTE — Telephone Encounter (Signed)
Spoke with the patient. She saw Dr Stephanie Acre this week. She has received her instructions from Dr Stephanie Acre regarding the Warfarin. The patient informs me she will take her last dose on Saturday 04/01/22 and will not resume it until instructed to do so by Dr Lorenso Courier.  Briefly reviewed her instruction for prep. No further questions.

## 2022-03-28 ENCOUNTER — Ambulatory Visit (HOSPITAL_BASED_OUTPATIENT_CLINIC_OR_DEPARTMENT_OTHER): Payer: BC Managed Care – PPO | Admitting: Physical Therapy

## 2022-03-28 ENCOUNTER — Encounter (HOSPITAL_COMMUNITY): Payer: Self-pay | Admitting: Internal Medicine

## 2022-03-28 ENCOUNTER — Encounter (HOSPITAL_BASED_OUTPATIENT_CLINIC_OR_DEPARTMENT_OTHER): Payer: Self-pay | Admitting: Physical Therapy

## 2022-03-28 DIAGNOSIS — G8929 Other chronic pain: Secondary | ICD-10-CM

## 2022-03-28 DIAGNOSIS — M6281 Muscle weakness (generalized): Secondary | ICD-10-CM

## 2022-03-28 DIAGNOSIS — R262 Difficulty in walking, not elsewhere classified: Secondary | ICD-10-CM

## 2022-03-28 NOTE — Therapy (Signed)
OUTPATIENT PHYSICAL THERAPY TREATMENT NOTE   Patient Name: Natasha Rollins MRN: [id number] DOB:[birth year], 61 y.o., female Today's Date: 03/28/2022  PCP: Jonathon Jordan, MD  REFERRING PROVIDER: Trula Slade, DPM   END OF SESSION:   PT End of Session - 03/28/22 1740     Visit Number 7    Date for PT Re-Evaluation 05/12/22    Authorization Type BC[employer]    Progress Note Due on Visit 15    PT Start Time 1712   pt late for appt   PT Stop Time 1745    PT Time Calculation (min) 33 min    Activity Tolerance Patient tolerated treatment well    Behavior During Therapy WFL for tasks assessed/performed                Past Medical History:  Diagnosis Date   Diabetes mellitus without complication ([place])    Fat necrosis 05/28/2018   Hidradenitis suppurativa 05/28/2018   Hypertension    Morbid obesity ([place])    Right second toe ulcer ([place]) 05/28/2018   Past Surgical History:  Procedure Laterality Date   CESAREAN SECTION     x2   CHOLECYSTECTOMY     HERNIA REPAIR     TONSILLECTOMY     Patient Active Problem List   Diagnosis Date Noted   Hyperglycemia due to type 2 diabetes mellitus ([place]) 12/28/2020   Hyperlipidemia 12/28/2020   Non-toxic goiter 12/28/2020   Proteinuria 12/28/2020   Thyroid nodule 12/28/2020   Pain in right hand 03/23/2020   Sprain of foot 06/27/2019   Synovial cyst of right popliteal space 05/15/2019   Spoon shaped nails 07/20/2018   Right second toe ulcer ([place]) 05/28/2018   Hidradenitis suppurativa 05/28/2018   Fat necrosis 05/28/2018   Osteoarthritis of knee 04/17/2018   MRSA (methicillin resistant staph aureus) culture positive 03/29/2018   DVT of lower extremity, bilateral ([place]) 02/19/2018   Pain in right foot 12/07/2017   Leukocytosis 06/21/2016   Pulmonary emboli ([place]) 06/20/2016   Diabetes ([place]) 12/22/2012   Hypertension 12/22/2012   Morbid obesity with BMI of 60.0-69.9, adult ([place]) 07/08/2010   OVARIAN CYST 07/08/2010    REFERRING  DIAG: Difficulty in walking, not elsewhere classified    THERAPY DIAG:  Muscle weakness (generalized)  Difficulty in walking, not elsewhere classified  Chronic pain of right knee  Chronic pain of left knee  Rationale for Evaluation and Treatment Rehabilitation  PERTINENT HISTORY: NA  PRECAUTIONS: None  SUBJECTIVE: Patient states increase pain in knees and back today  PAIN:  Are you having pain? Yes: NPRS scale: 8/10 Pain location: Bil knees RT > Lt Pain description: sore, stiff Aggravating factors: WB Relieving factors: NWB   OBJECTIVE: (objective measures completed at initial evaluation unless otherwise dated)   DIAGNOSTIC FINDINGS: none   PATIENT SURVEYS:  Initial eval: LEFS 10/80, 12.5% self percieved ability, 87.5% self percieved disability FOTO 11 (goal is 40) 03/22/22:  LEFS 17/80, 21% self perceived ability, 79% self perceived disability  FOTO : 26 COGNITION:           Overall cognitive status: Within functional limits for tasks assessed                          SENSATION: WFL   POSTURE: flexed trunk    PALPATION: Mild crepitus bilateral knees   LOWER EXTREMITY ROM:   WFL   LOWER EXTREMITY MMT:   Generally 4- to 4/5 bilateral LE's   FUNCTIONAL TESTS:  Initial eval: 5 times sit to stand: 25.6 sec (patient could not come to full stand so these reps were not full stand) Timed up and go (TUG): 48.6 sec (leaning fwd on forearms)  03/22/22: 5 times sit to stand: 30.84 sec (patient coming to full stand with upright posture) Timed up and go (TUG): 33.73 sec (Patient upright and not leaning on elbows)   GAIT: Distance walked: 50 feet Assistive device utilized: Environmental consultant - 2 wheeled and Wheelchair (manual) Level of assistance: Modified independence Comments: antalgic guarded       TODAY'S TREATMENT:   03/28/22:Pt arrives for aquatic physical therapy. Treatment took place in 3.5-5.5 feet of water. Water temperature was 92 degrees F. Pt entered  the pool via stairs: step to step slowly with heavy use of the rails. Pt requires buoyancy of water for support and to offload joints with strengthening exercises.  Seated water bench with 75% submersion Pt performed seated LE AROM exercises 20x in all planes, STS x5 onto water step from bench.Cues for weight shift and gaining immediate standing balance. Pt with increased difficulty today due to increased pain sensitivity Seated LB stretch reaching forward x3 then slightly R/L rotation holding x 10s. 75% depth water walking forward and backward with UE support of with white barbell to decrease LB discomfort. Gait training: cues for knee flex;  heel strike, toe off.      03/22/22: Re-assessment and updated POC (recert)   44/03/47:QQ arrives for first aquatic physical therapy. Treatment took place in 3.5-5.5 feet of water. Water temperature was 91 degrees F. Pt entered the pool via stairs: step to step slowly with heavy use of the rails. Pt requires buoyancy of water for support and to offload joints with strengthening exercises.  Seated water bench with 75% submersion Pt performed seated LE AROM exercises 20x in all planes,  75% depth water walking 10x forward, 10x backwards,10x sidestepping no extra AD. Seated RTLE hamstring stretch 3x 10 sec, ankle pumps 20x   02/17/22:Pt arrives for first aquatic physical therapy. Treatment took place in 3.5-5.5 feet of water. Water temperature was 91 degrees F. Pt entered the pool via stairs: step to step slowly with heavy use of the rails. Pt requires buoyancy of water for support and to offload joints with strengthening exercises.  Seated water bench with 75% submersion Pt performed seated LE AROM exercises 20x in all planes,  75% depth water walking 8x forward, 8x backwards,8x sidestepping no extra AD.  02/03/22: Pt arrives for first aquatic physical therapy. Treatment took place in 3.5-5.5 feet of water. Water temperature was 91 degrees F. Pt entered the  pool via stairs: step to step slowly with heavy use of the rails. Pt requires buoyancy of water for support and to offload joints with strengthening exercises.  Seated water bench with 75% submersion Pt performed seated LE AROM exercises 20x in all planes,  75% depth water walking 4x forward, 4x backwards, no extra AD.   Initial eval completed   HOME EXERCISE PROGRAM: Encouraged patient to continue to try and walk as much as tolerated.     ASSESSMENT:   CLINICAL IMPRESSION:  Pt limited by pain today back>knees. She was late for appointment limiting our time. Required continuous vc for knee flex with amb. She tolerates seated exercises well, standing limited to back pain. She was able to enter pool via step with cga and handrail forward facing but needs lift to exit. She reports good response from last session without any increase in pain  of knees or residual muscle soreness.  Goals ongoing.      OBJECTIVE IMPAIRMENTS Abnormal gait, decreased activity tolerance, decreased balance, decreased mobility, difficulty walking, decreased ROM, decreased strength, impaired flexibility, postural dysfunction, obesity, and pain.    ACTIVITY LIMITATIONS carrying, lifting, bending, sitting, standing, squatting, stairs, transfers, bed mobility, bathing, toileting, and dressing   PARTICIPATION LIMITATIONS: meal prep, cleaning, laundry, driving, shopping, community activity, occupation, yard work, and church   [place], Profession, Time since onset of injury/illness/exacerbation, and 1-2 comorbidities: obesity, htn  are also affecting patient's functional outcome.    REHAB POTENTIAL: Fair based on patients size   CLINICAL DECISION MAKING: Evolving/moderate complexity   EVALUATION COMPLEXITY: Moderate     GOALS: Goals reviewed with patient? Yes   SHORT TERM GOALS: Target date: 02/19/2022  Pain report to be no greater than 4/10  Baseline: Goal status: MET   LONG TERM GOALS: Target  date: 03/19/2022    Patient to report pain no greater than 2/10  Baseline:  Goal status: INITIAL   2.  Patient to be able to walk 300 feet with rolling walker without knee instability safely Baseline:  Goal status: INITIAL   3.  Patient to be able to do all ADL's independently and safely Baseline:  Goal status: INITIAL   4.  Patient to be able to do basic IADL's independently and safely. Baseline:  Goal status: INITIAL   5.  Patient to report 85% improvement in overall condition and function Baseline:  Goal status: INITIAL       PLAN: PT FREQUENCY: 1-2x/week   PT DURATION: 8 weeks   PLANNED INTERVENTIONS: Therapeutic exercises, Therapeutic activity, Neuromuscular re-education, Balance training, Gait training, Patient/Family education, Self Care, Joint mobilization, Stair training, Aquatic Therapy, Dry Needling, Electrical stimulation, Cryotherapy, Moist heat, Taping, Vasopneumatic device, Ultrasound, Manual therapy, and Re-evaluation   PLAN FOR NEXT SESSION: Aquatics therapy program    Stanton Kidney Tharon Aquas) Tomara Youngberg MPT 03/28/22 6:00 PM

## 2022-04-04 ENCOUNTER — Ambulatory Visit (HOSPITAL_BASED_OUTPATIENT_CLINIC_OR_DEPARTMENT_OTHER): Payer: BC Managed Care – PPO | Admitting: Physical Therapy

## 2022-04-06 ENCOUNTER — Ambulatory Visit (HOSPITAL_COMMUNITY): Payer: BC Managed Care – PPO | Admitting: Certified Registered Nurse Anesthetist

## 2022-04-06 ENCOUNTER — Encounter (HOSPITAL_COMMUNITY): Payer: Self-pay | Admitting: Internal Medicine

## 2022-04-06 ENCOUNTER — Telehealth: Payer: Self-pay

## 2022-04-06 ENCOUNTER — Encounter (HOSPITAL_COMMUNITY)
Admission: RE | Disposition: A | Discharge status: Home / Self Care | Payer: Self-pay | Source: Home / Self Care | Attending: Internal Medicine

## 2022-04-06 ENCOUNTER — Ambulatory Visit (HOSPITAL_COMMUNITY)
Admission: RE | Admit: 2022-04-06 | Discharge: 2022-04-06 | Disposition: A | Discharge status: Home / Self Care | Payer: BC Managed Care – PPO | Attending: Internal Medicine | Admitting: Internal Medicine

## 2022-04-06 ENCOUNTER — Other Ambulatory Visit: Payer: Self-pay

## 2022-04-06 DIAGNOSIS — Z794 Long term (current) use of insulin: Secondary | ICD-10-CM | POA: Diagnosis not present

## 2022-04-06 DIAGNOSIS — Z993 Dependence on wheelchair: Secondary | ICD-10-CM | POA: Insufficient documentation

## 2022-04-06 DIAGNOSIS — Z6841 Body Mass Index (BMI) 40.0 and over, adult: Secondary | ICD-10-CM | POA: Insufficient documentation

## 2022-04-06 DIAGNOSIS — R195 Other fecal abnormalities: Secondary | ICD-10-CM | POA: Insufficient documentation

## 2022-04-06 DIAGNOSIS — I10 Essential (primary) hypertension: Secondary | ICD-10-CM | POA: Insufficient documentation

## 2022-04-06 DIAGNOSIS — Z7984 Long term (current) use of oral hypoglycemic drugs: Secondary | ICD-10-CM | POA: Insufficient documentation

## 2022-04-06 DIAGNOSIS — K5730 Diverticulosis of large intestine without perforation or abscess without bleeding: Secondary | ICD-10-CM | POA: Diagnosis not present

## 2022-04-06 DIAGNOSIS — E119 Type 2 diabetes mellitus without complications: Secondary | ICD-10-CM | POA: Insufficient documentation

## 2022-04-06 DIAGNOSIS — Z86718 Personal history of other venous thrombosis and embolism: Secondary | ICD-10-CM | POA: Diagnosis not present

## 2022-04-06 DIAGNOSIS — D123 Benign neoplasm of transverse colon: Secondary | ICD-10-CM | POA: Diagnosis not present

## 2022-04-06 DIAGNOSIS — Z1211 Encounter for screening for malignant neoplasm of colon: Secondary | ICD-10-CM | POA: Insufficient documentation

## 2022-04-06 DIAGNOSIS — K648 Other hemorrhoids: Secondary | ICD-10-CM | POA: Diagnosis not present

## 2022-04-06 DIAGNOSIS — K635 Polyp of colon: Secondary | ICD-10-CM | POA: Diagnosis not present

## 2022-04-06 DIAGNOSIS — Z1212 Encounter for screening for malignant neoplasm of rectum: Secondary | ICD-10-CM

## 2022-04-06 HISTORY — PX: POLYPECTOMY: SHX5525

## 2022-04-06 HISTORY — PX: COLONOSCOPY WITH PROPOFOL: SHX5780

## 2022-04-06 LAB — POCT I-STAT, CHEM 8
BUN: 22 mg/dL (ref 8–23)
Calcium, Ion: 1.12 mmol/L — ABNORMAL LOW (ref 1.15–1.40)
Chloride: 102 mmol/L (ref 98–111)
Creatinine, Ser: 0.8 mg/dL (ref 0.44–1.00)
Glucose, Bld: 114 mg/dL — ABNORMAL HIGH (ref 70–99)
HCT: 37 % (ref 36.0–46.0)
Hemoglobin: 12.6 g/dL (ref 12.0–15.0)
Potassium: 3.6 mmol/L (ref 3.5–5.1)
Sodium: 138 mmol/L (ref 135–145)
TCO2: 26 mmol/L (ref 22–32)

## 2022-04-06 SURGERY — COLONOSCOPY WITH PROPOFOL
Anesthesia: Monitor Anesthesia Care

## 2022-04-06 MED ORDER — PROPOFOL 1000 MG/100ML IV EMUL
INTRAVENOUS | Status: AC
  Filled 2022-04-06: qty 100

## 2022-04-06 MED ORDER — SODIUM CHLORIDE 0.9 % IV SOLN: INTRAVENOUS | Status: DC

## 2022-04-06 MED ORDER — ONDANSETRON HCL 4 MG/2ML IJ SOLN
INTRAMUSCULAR | Status: DC | PRN
  Administered 2022-04-06: 4 mg via INTRAVENOUS

## 2022-04-06 MED ORDER — PROPOFOL 10 MG/ML IV BOLUS
INTRAVENOUS | Status: DC | PRN
  Administered 2022-04-06 (×2): 20 mg via INTRAVENOUS

## 2022-04-06 MED ORDER — PROPOFOL 500 MG/50ML IV EMUL
INTRAVENOUS | Status: DC | PRN
  Administered 2022-04-06: 125 ug/kg/min via INTRAVENOUS

## 2022-04-06 MED ORDER — LIDOCAINE 2% (20 MG/ML) 5 ML SYRINGE
INTRAMUSCULAR | Status: DC | PRN
  Administered 2022-04-06: 100 mg via INTRAVENOUS

## 2022-04-06 MED ORDER — PROPOFOL 500 MG/50ML IV EMUL
INTRAVENOUS | Status: AC
  Filled 2022-04-06: qty 50

## 2022-04-06 SURGICAL SUPPLY — 22 items

## 2022-04-06 NOTE — Anesthesia Postprocedure Evaluation (Signed)
Anesthesia Post Note  Patient: Keleigh Kazee  Procedure(s) Performed: COLONOSCOPY WITH PROPOFOL POLYPECTOMY     Patient location during evaluation: Endoscopy Anesthesia Type: MAC Level of consciousness: awake and alert Pain management: pain level controlled Vital Signs Assessment: post-procedure vital signs reviewed and stable Respiratory status: spontaneous breathing, nonlabored ventilation, respiratory function stable and patient connected to nasal cannula oxygen Cardiovascular status: blood pressure returned to baseline and stable Postop Assessment: no apparent nausea or vomiting Anesthetic complications: no  No notable events documented.  Last Vitals:  Vitals:   04/06/22 0820 04/06/22 0830  BP: (!) 115/47 (!) 130/44  Pulse: 88 82  Resp: 18 (!) 21  Temp:    SpO2: 98% 97%    Last Pain:  Vitals:   04/06/22 0830  TempSrc:   PainSc: 0-No pain                 Ronnell Clinger L Zynasia Burklow

## 2022-04-06 NOTE — Discharge Instructions (Signed)
YOU HAD AN ENDOSCOPIC PROCEDURE TODAY: Refer to the procedure report and other information in the discharge instructions given to you for any specific questions about what was found during the examination. If this information does not answer your questions, please call [place] office at [phone] to clarify.  ° °YOU SHOULD EXPECT: Some feelings of bloating in the abdomen. Passage of more gas than usual. Walking can help get rid of the air that was put into your GI tract during the procedure and reduce the bloating. If you had a lower endoscopy (such as a colonoscopy or flexible sigmoidoscopy) you may notice spotting of blood in your stool or on the toilet paper. Some abdominal soreness may be present for a day or two, also. ° °DIET: Your first meal following the procedure should be a light meal and then it is ok to progress to your normal diet. A half-sandwich or bowl of soup is an example of a good first meal. Heavy or fried foods are harder to digest and may make you feel nauseous or bloated. Drink plenty of fluids but you should avoid alcoholic beverages for 24 hours. If you had a esophageal dilation, please see attached instructions for diet.   ° °ACTIVITY: Your care partner should take you home directly after the procedure. You should plan to take it easy, moving slowly for the rest of the day. You can resume normal activity the day after the procedure however YOU SHOULD NOT DRIVE, use power tools, machinery or perform tasks that involve climbing or major physical exertion for 24 hours (because of the sedation medicines used during the test).  ° °SYMPTOMS TO REPORT IMMEDIATELY: °A gastroenterologist can be reached at any hour. Please call [phone]  for any of the following symptoms:  °Following lower endoscopy (colonoscopy, flexible sigmoidoscopy) °Excessive amounts of blood in the stool  °Significant tenderness, worsening of abdominal pains  °Swelling of the abdomen that is new, acute  °Fever of 100° or  higher  °Following upper endoscopy (EGD, EUS, ERCP, esophageal dilation) °Vomiting of blood or coffee ground material  °New, significant abdominal pain  °New, significant chest pain or pain under the shoulder blades  °Painful or persistently difficult swallowing  °New shortness of breath  °Black, tarry-looking or red, bloody stools ° °FOLLOW UP:  °If any biopsies were taken you will be contacted by phone or by letter within the next 1-3 weeks. Call [phone]  if you have not heard about the biopsies in 3 weeks.  °Please also call with any specific questions about appointments or follow up tests. ° °

## 2022-04-06 NOTE — Anesthesia Procedure Notes (Signed)
Procedure Name: MAC Date/Time: 04/06/2022 7:41 AM  Performed by: Maxwell Caul, CRNAPre-anesthesia Checklist: Patient identified, Emergency Drugs available, Suction available and Patient being monitored Oxygen Delivery Method: Simple face mask

## 2022-04-06 NOTE — Transfer of Care (Signed)
Immediate Anesthesia Transfer of Care Note  Patient: Natasha Rollins  Procedure(s) Performed: COLONOSCOPY WITH PROPOFOL POLYPECTOMY  Patient Location: PACU  Anesthesia Type:MAC  Level of Consciousness: awake, alert , and oriented  Airway & Oxygen Therapy: Patient Spontanous Breathing and Patient connected to face mask oxygen  Post-op Assessment: Report given to RN and Post -op Vital signs reviewed and stable  Post vital signs: Reviewed and stable  Last Vitals:  Vitals Value Taken Time  BP    Temp    Pulse    Resp    SpO2      Last Pain:  Vitals:   04/06/22 0721  TempSrc: Temporal  PainSc: 5          Complications: No notable events documented.

## 2022-04-06 NOTE — Op Note (Signed)
[facility] Patient Name: Natasha Rollins Procedure Date: 04/06/2022 MRN: [id number] Attending MD: Georgian Co , , [id number] Date of Birth: [birth year] CSN: [id number] Age: 61 Admit Type: Outpatient Procedure:                Colonoscopy Indications:              Positive Cologuard test Providers:                Adline Mango" Trudie Buckler, RN, Jamison Neighbor, RN, William Dalton, Technician Referring MD:             Jonathon Jordan Medicines:                Monitored Anesthesia Care Complications:            No immediate complications. Estimated Blood Loss:     Estimated blood loss was minimal. Procedure:                Pre-Anesthesia Assessment:                           - Prior to the procedure, a History and Physical                            was performed, and patient medications and                            allergies were reviewed. The patient's tolerance of                            previous anesthesia was also reviewed. The risks                            and benefits of the procedure and the sedation                            options and risks were discussed with the patient.                            All questions were answered, and informed consent                            was obtained. Prior Anticoagulants: The patient has                            taken Coumadin (warfarin), last dose was 5 days                            prior to procedure. ASA Grade Assessment: III - A                            patient with severe systemic disease. After  reviewing the risks and benefits, the patient was                            deemed in satisfactory condition to undergo the                            procedure.                           After obtaining informed consent, the colonoscope                            was passed under direct vision. Throughout the                             procedure, the patient's blood pressure, pulse, and                            oxygen saturations were monitored continuously. The                            CF-HQ190L ([id number]) Olympus colonoscope was                            introduced through the anus and advanced to the the                            cecum, identified by appendiceal orifice and                            ileocecal valve. The colonoscopy was performed                            without difficulty. The patient tolerated the                            procedure well. The quality of the bowel                            preparation was excellent. The ileocecal valve,                            appendiceal orifice, and rectum were photographed. Scope In: 7:52:36 AM Scope Out: 8:08:33 AM Scope Withdrawal Time: 0 hours 12 minutes 7 seconds  Total Procedure Duration: 0 hours 15 minutes 57 seconds  Findings:      Multiple diverticula were found in the sigmoid colon and ascending colon.      Two sessile polyps were found in the transverse colon. The polyps were 3       to 4 mm in size. These polyps were removed with a cold snare. Resection       and retrieval were complete.      Non-bleeding internal hemorrhoids were found during retroflexion.      Anorectal wart was noted on rectal exam. Impression:               -  Diverticulosis in the sigmoid colon and in the                            ascending colon.                           - Two 3 to 4 mm polyps in the transverse colon,                            removed with a cold snare. Resected and retrieved.                           - Non-bleeding internal hemorrhoids.                           - Anorectal wart. Moderate Sedation:      Not Applicable - Patient had care per Anesthesia. Recommendation:           - Discharge patient to home (with escort).                           - Await pathology results.                           - Okay to restart warfarin tonight.                            - Will refer to colorectal surgery for further                            evaluation of perianal wart.                           - The findings and recommendations were discussed                            with the patient. Procedure Code(s):        --- Professional ---                           [phone], Colonoscopy, flexible; with removal of                            tumor(s), polyp(s), or other lesion(s) by snare                            technique Diagnosis Code(s):        --- Professional ---                           K64.8, Other hemorrhoids                           D12.3, Benign neoplasm of transverse colon (hepatic                            flexure  or splenic flexure)                           R19.5, Other fecal abnormalities                           K57.30, Diverticulosis of large intestine without                            perforation or abscess without bleeding CPT copyright 2022 American [employer]. All rights reserved. The codes documented in this report are preliminary and upon coder review may  be revised to meet current compliance requirements. Dr Georgian Co "Lyndee Leo" Lorenso Courier,  04/06/2022 8:22:29 AM Number of Addenda: 0

## 2022-04-06 NOTE — H&P (Signed)
GASTROENTEROLOGY PROCEDURE H&P NOTE   Primary Care Physician: Jonathon Jordan, MD    Reason for Procedure:   Positive Cologuard  Plan:    Colonoscopy  Patient is appropriate for endoscopic procedure(s) in the hospital setting.  The nature of the procedure, as well as the risks, benefits, and alternatives were carefully and thoroughly reviewed with the patient. Ample time for discussion and questions allowed. The patient understood, was satisfied, and agreed to proceed.     HPI: Natasha Rollins is a 61 y.o. female who presents for colonoscopy for evaluation of positive Cologuard .  Patient was most recently seen in the Gastroenterology Clinic on 01/06/22.  No interval change in medical history since that appointment. Please refer to that note for full details regarding GI history and clinical presentation.   Past Medical History:  Diagnosis Date   Diabetes mellitus without complication ([place])    Fat necrosis 05/28/2018   Hidradenitis suppurativa 05/28/2018   Hypertension    Morbid obesity ([place])    Right second toe ulcer ([place]) 05/28/2018    Past Surgical History:  Procedure Laterality Date   CESAREAN SECTION     x2   CHOLECYSTECTOMY     HERNIA REPAIR     TONSILLECTOMY      Prior to Admission medications   Medication Sig Start Date End Date Taking? Authorizing Provider  allopurinol (ZYLOPRIM) 100 MG tablet Take 1 tablet (100 mg total) by mouth daily. 09/05/19  Yes Trula Slade, DPM  amLODipine (NORVASC) 10 MG tablet Take 10 mg by mouth daily. 06/07/14  Yes [provider]  atorvastatin (LIPITOR) 10 MG tablet TAKE 1 TABLET 3 DAYS A WEEK 03/12/19  Yes [provider]  B-D UF III MINI PEN NEEDLES 31G X 5 MM MISC USE AS DIRECTED 5 TIMES A DAY 12/20/18  Yes [provider]  bisacodyl (DULCOLAX) 5 MG EC tablet Take 4 tablets at 3:00 pm on 04/05/22 03/23/22  Yes Sharyn Creamer, MD  Blood Glucose Monitoring Suppl (FREESTYLE FREEDOM LITE) w/Device KIT  See admin instructions. 07/03/19  Yes [provider]  clindamycin (CLEOCIN T) 1 % lotion Apply topically daily. to face 12/22/18  Yes [provider]  clotrimazole-betamethasone (LOTRISONE) cream  11/08/18  Yes [provider]  folic acid (FOLVITE) 1 MG tablet Take 1 mg by mouth daily.   Yes [provider]  JANUVIA 100 MG tablet Take 100 mg by mouth daily. 02/24/18  Yes [provider]  Lancets (ONETOUCH DELICA PLUS [id number]) MISC USE AS DIRECTED ONCE A DAY. 06/17/18  Yes [provider]  LEVEMIR FLEXTOUCH 100 UNIT/ML Pen Inject 75 Units into the skin 2 (two) times daily.  07/28/14  Yes [provider]  losartan-hydrochlorothiazide (HYZAAR) 100-25 MG tablet Take 1 tablet by mouth daily. 12/20/17  Yes [provider]  NOVOLOG FLEXPEN 100 UNIT/ML FlexPen Inject 30 Units into the skin 3 (three) times daily with meals.  07/28/14  Yes [provider]  polyethylene glycol (COLYTE WITH FLAVOR PACKS) 240 g solution Take as a split dose following doctor instructions 03/23/22  Yes Sharyn Creamer, MD  pregabalin (LYRICA) 50 MG capsule pregabalin 50 mg capsule  TAKE 1 CAPSULE BY MOUTH TWICE A DAY   Yes [provider]  allopurinol (ZYLOPRIM) 100 MG tablet     [provider]  amLODipine (NORVASC) 10 MG tablet Take 1 tablet by mouth daily.    [provider]  azithromycin (ZITHROMAX) 250 MG tablet  12/13/20  [provider]  colchicine 0.6 MG tablet Take 1 tablet (0.6 mg total) by mouth daily. 07/04/19   Trula Slade, DPM  gabapentin (NEURONTIN) 100 MG capsule     [provider]  gabapentin (NEURONTIN) 300 MG capsule Take 1 capsule (300 mg total) by mouth at bedtime. 10/27/19   Trula Slade, DPM  HYDROcodone-acetaminophen (NORCO/VICODIN) 5-325 MG tablet  06/29/19   [provider]  hydrocortisone 2.5 % lotion  12/23/18   [provider]  metroNIDAZOLE (METROGEL) 1  % gel  02/05/18   [provider]  mupirocin ointment (BACTROBAN) 2 % Apply topically. 02/19/18   [provider]  ONETOUCH VERIO test strip  11/26/18   [provider]  rosuvastatin (CRESTOR) 10 MG tablet rosuvastatin 10 mg tablet  TAKE 1 TABLET BY MOUTH FOR 3 DAYS A WEEK 10/13/20   [provider]  traMADol Veatrice [place]bon) 50 MG tablet  04/18/19   [provider]  warfarin (COUMADIN) 5 MG tablet Take 1 tablet (5 mg total) by mouth daily at 6 PM. 09/11/16 01/06/22  Wendie Agreste, MD    Current Facility-Administered Medications  Medication Dose Route Frequency Provider Last Rate Last Admin   0.9 %  sodium chloride infusion   Intravenous Continuous Sharyn Creamer, MD        Allergies as of 02/14/2022 - Review Complete 02/03/2022  Allergen Reaction Noted   Penicillins Itching and Rash 05/23/2010    Family History  Problem Relation Age of Onset   Colon cancer Neg Hx    Stomach cancer Neg Hx    Esophageal cancer Neg Hx    Colon polyps Neg Hx     Social History   Socioeconomic History   Marital status: Married    Spouse name: Not on file   Number of children: 2   Years of education: Not on file   Highest education level: Not on file  Occupational History   Occupation: Education  Tobacco Use   Smoking status: Never   Smokeless tobacco: Never  Vaping Use   Vaping Use: Never used  Substance and Sexual Activity   Alcohol use: No   Drug use: No   Sexual activity: Yes    Birth control/protection: Post-menopausal  Other Topics Concern   Not on file  Social History Narrative   Not on file   Social Determinants of Health   Financial Resource Strain: Not on file  Food Insecurity: Not on file  Transportation Needs: Not on file  Physical Activity: Not on file  Stress: Not on file  Social Connections: Not on file  Intimate Partner Violence: Not on file    Physical Exam: Vital signs in last 24 hours: Pulse 100   Temp 98.2 F (36.8 C)  (Temporal)   Resp 16   Ht _0  (1.626 m)   Wt (!) 163.3 kg   SpO2 97%   BMI 61.79 kg/m  GEN: NAD EYE: Sclerae anicteric ENT: MMM CV: Non-tachycardic Pulm: No increased WOB GI: Soft NEURO:  Alert & Oriented   Christia Reading, MD [facility]   04/06/2022 7:34 AM

## 2022-04-06 NOTE — Telephone Encounter (Signed)
Records faxed to [facility] for referral.

## 2022-04-06 NOTE — Anesthesia Preprocedure Evaluation (Addendum)
Anesthesia Evaluation  Patient identified by MRN, date of birth, ID band Patient awake    Reviewed: Allergy & Precautions, NPO status , Patient's Chart, lab work & pertinent test results  History of Anesthesia Complications Negative for: history of anesthetic complications  Airway Mallampati: II  TM Distance: >3 FB Neck ROM: Full    Dental  (+) Dental Advisory Given   Pulmonary PE   Pulmonary exam normal        Cardiovascular hypertension, Pt. on medications + DVT  Normal cardiovascular exam     Neuro/Psych  Wheelchair dependent   negative psych ROS   GI/Hepatic negative GI ROS,,,  Endo/Other  diabetes, Type 2, Oral Hypoglycemic Agents, Insulin Dependent  Morbid obesity  Renal/GU negative Renal ROS     Musculoskeletal  (+) Arthritis ,  narcotic dependent  Abdominal  (+) + obese  Peds  Hematology negative hematology ROS (+)   Anesthesia Other Findings   Reproductive/Obstetrics                             Anesthesia Physical Anesthesia Plan  ASA: 3  Anesthesia Plan: MAC   Post-op Pain Management: Minimal or no pain anticipated   Induction:   PONV Risk Score and Plan: 2 and Propofol infusion and Treatment may vary due to age or medical condition  Airway Management Planned: Nasal Cannula and Natural Airway  Additional Equipment: None  Intra-op Plan:   Post-operative Plan:   Informed Consent: I have reviewed the patients History and Physical, chart, labs and discussed the procedure including the risks, benefits and alternatives for the proposed anesthesia with the patient or authorized representative who has indicated his/her understanding and acceptance.       Plan Discussed with: CRNA and Anesthesiologist  Anesthesia Plan Comments:        Anesthesia Quick Evaluation

## 2022-04-06 NOTE — Telephone Encounter (Signed)
-----   Message from Sharyn Creamer, MD sent at 04/06/2022  8:17 AM EST ----- Hi Beth, let's refer to colorectal surgery for perianal warts.Thanks

## 2022-04-07 ENCOUNTER — Encounter (HOSPITAL_COMMUNITY): Payer: Self-pay | Admitting: Internal Medicine

## 2022-04-10 LAB — SURGICAL PATHOLOGY

## 2022-04-12 ENCOUNTER — Encounter: Payer: Self-pay | Admitting: Internal Medicine

## 2022-04-14 ENCOUNTER — Encounter: Payer: Self-pay | Admitting: Physical Therapy

## 2022-04-14 ENCOUNTER — Ambulatory Visit: Payer: BC Managed Care – PPO | Attending: Podiatry | Admitting: Physical Therapy

## 2022-04-14 DIAGNOSIS — G8929 Other chronic pain: Secondary | ICD-10-CM | POA: Insufficient documentation

## 2022-04-14 DIAGNOSIS — M6281 Muscle weakness (generalized): Secondary | ICD-10-CM | POA: Diagnosis present

## 2022-04-14 DIAGNOSIS — M25561 Pain in right knee: Secondary | ICD-10-CM | POA: Insufficient documentation

## 2022-04-14 DIAGNOSIS — R262 Difficulty in walking, not elsewhere classified: Secondary | ICD-10-CM | POA: Insufficient documentation

## 2022-04-14 DIAGNOSIS — M25562 Pain in left knee: Secondary | ICD-10-CM | POA: Diagnosis present

## 2022-04-14 NOTE — Therapy (Signed)
OUTPATIENT PHYSICAL THERAPY TREATMENT NOTE   Patient Name: Natasha Rollins MRN: [id number] DOB:1960-12-30, 61 y.o., female Today's Date: 04/14/2022  PCP: Jonathon Jordan, MD  REFERRING PROVIDER: Trula Slade, DPM   END OF SESSION:   PT End of Session - 04/14/22 1949     Visit Number 8    Date for PT Re-Evaluation 05/12/22    Authorization Type [employer]    Progress Note Due on Visit 15    PT Start Time 1520    PT Stop Time 1600    PT Time Calculation (min) 40 min    Activity Tolerance Patient limited by pain    Behavior During Therapy [facility] for tasks assessed/performed                 Past Medical History:  Diagnosis Date   Diabetes mellitus without complication ([place])    Fat necrosis 05/28/2018   Hidradenitis suppurativa 05/28/2018   Hypertension    Morbid obesity ([place])    Right second toe ulcer ([place]) 05/28/2018   Past Surgical History:  Procedure Laterality Date   CESAREAN SECTION     x2   CHOLECYSTECTOMY     COLONOSCOPY WITH PROPOFOL N/A 04/06/2022   Procedure: COLONOSCOPY WITH PROPOFOL;  Surgeon: Sharyn Creamer, MD;  Location: [facility];  Service: Gastroenterology;  Laterality: N/A;   HERNIA REPAIR     POLYPECTOMY  04/06/2022   Procedure: POLYPECTOMY;  Surgeon: Sharyn Creamer, MD;  Location: [facility];  Service: Gastroenterology;;   TONSILLECTOMY     Patient Active Problem List   Diagnosis Date Noted   Hyperglycemia due to type 2 diabetes mellitus ([place]) 12/28/2020   Hyperlipidemia 12/28/2020   Non-toxic goiter 12/28/2020   Proteinuria 12/28/2020   Thyroid nodule 12/28/2020   Pain in right hand 03/23/2020   Sprain of foot 06/27/2019   Synovial cyst of right popliteal space 05/15/2019   Spoon shaped nails 07/20/2018   Right second toe ulcer ([place]) 05/28/2018   Hidradenitis suppurativa 05/28/2018   Fat necrosis 05/28/2018   Osteoarthritis of knee 04/17/2018   MRSA (methicillin resistant staph aureus) culture positive 03/29/2018   DVT of lower  extremity, bilateral ([place]) 02/19/2018   Pain in right foot 12/07/2017   Leukocytosis 06/21/2016   Pulmonary emboli ([place]) 06/20/2016   Diabetes ([place]) 12/22/2012   Hypertension 12/22/2012   Morbid obesity with BMI of 60.0-69.9, adult ([place]) 07/08/2010   OVARIAN CYST 07/08/2010    REFERRING DIAG: Difficulty in walking, not elsewhere classified    THERAPY DIAG:  Muscle weakness (generalized)  Difficulty in walking, not elsewhere classified  Chronic pain of right knee  Chronic pain of left knee  Rationale for Evaluation and Treatment Rehabilitation  PERTINENT HISTORY: NA  PRECAUTIONS: None  SUBJECTIVE: Not a good week for me. Both my knees (RT > LT) and my back have hurt a lot.   PAIN:  Are you having pain? Yes: NPRS scale: 8/10 Pain location: Bil knees RT > Lt Pain description: sore, stiff Aggravating factors: WB Relieving factors: NWB   OBJECTIVE: (objective measures completed at initial evaluation unless otherwise dated)   DIAGNOSTIC FINDINGS: none   PATIENT SURVEYS:  Initial eval: LEFS 10/80, 12.5% self percieved ability, 87.5% self percieved disability FOTO 11 (goal is 40) 03/22/22:  LEFS 17/80, 21% self perceived ability, 79% self perceived disability  FOTO : 26 COGNITION:           Overall cognitive status: Within functional limits for tasks assessed  SENSATION: WFL   POSTURE: flexed trunk    PALPATION: Mild crepitus bilateral knees   LOWER EXTREMITY ROM:   WFL   LOWER EXTREMITY MMT:   Generally 4- to 4/5 bilateral LE's   FUNCTIONAL TESTS:  Initial eval: 5 times sit to stand: 25.6 sec (patient could not come to full stand so these reps were not full stand) Timed up and go (TUG): 48.6 sec (leaning fwd on forearms)  03/22/22: 5 times sit to stand: 30.84 sec (patient coming to full stand with upright posture) Timed up and go (TUG): 33.73 sec (Patient upright and not leaning on elbows)   GAIT: Distance walked: 50  feet Assistive device utilized: Environmental consultant - 2 wheeled and Wheelchair (manual) Level of assistance: Modified independence Comments: antalgic guarded       TODAY'S TREATMENT:  04/14/22: Pt arrives for aquatic physical therapy. Treatment took place in 3.5-5.5 feet of water. Water temperature was 92 degrees F. Pt entered the pool via stairs: step to step slowly with heavy use of the rails.getting in and mechanical lift to exit.  Pt requires buoyancy of water for support and to offload joints with strengthening exercises.  Seated water bench with 75% submersion with concurrent pain assessment and discussion of current status.  75% water walking with no assistance: pt could complete 10x in each direction. Patient reports increasing RT knee pain and requested to sit on water bench again. Sitting on bench: single buoy UE wts for horizontal shoulder abd/add with VC to contract core and glutes 2x10. Lat press/core press with small blue noodle 10x with VC to engage glutes more. Push pull kickboard 10x. Attempted modified child pose holding onto the railings but pt reported too much back pain.     03/28/22:Pt arrives for aquatic physical therapy. Treatment took place in 3.5-5.5 feet of water. Water temperature was 92 degrees F. Pt entered the pool via stairs: step to step slowly with heavy use of the rails. Pt requires buoyancy of water for support and to offload joints with strengthening exercises.  Seated water bench with 75% submersion Pt performed seated LE AROM exercises 20x in all planes, STS x5 onto water step from bench.Cues for weight shift and gaining immediate standing balance. Pt with increased difficulty today due to increased pain sensitivity Seated LB stretch reaching forward x3 then slightly R/L rotation holding x 10s. 75% depth water walking forward and backward with UE support of with white barbell to decrease LB discomfort. Gait training: cues for knee flex;  heel strike, toe off.       03/22/22: Re-assessment and updated POC (recert)   01/60/10:XN arrives for first aquatic physical therapy. Treatment took place in 3.5-5.5 feet of water. Water temperature was 91 degrees F. Pt entered the pool via stairs: step to step slowly with heavy use of the rails. Pt requires buoyancy of water for support and to offload joints with strengthening exercises.  Seated water bench with 75% submersion Pt performed seated LE AROM exercises 20x in all planes,  75% depth water walking 10x forward, 10x backwards,10x sidestepping no extra AD. Seated RTLE hamstring stretch 3x 10 sec, ankle pumps 20x     HOME EXERCISE PROGRAM: Encouraged patient to continue to try and walk as much as tolerated.     ASSESSMENT:   CLINICAL IMPRESSION: Pt arrives to aquatic PT with back and bilateral knee pain. 75% submersion did not lessen pain today. Rt knee pain and back pain were limiting factors today. Pt reports she mostly likely will  seek an injection in her back soon.       OBJECTIVE IMPAIRMENTS Abnormal gait, decreased activity tolerance, decreased balance, decreased mobility, difficulty walking, decreased ROM, decreased strength, impaired flexibility, postural dysfunction, obesity, and pain.    ACTIVITY LIMITATIONS carrying, lifting, bending, sitting, standing, squatting, stairs, transfers, bed mobility, bathing, toileting, and dressing   PARTICIPATION LIMITATIONS: meal prep, cleaning, laundry, driving, shopping, community activity, occupation, yard work, and church   [place], Profession, Time since onset of injury/illness/exacerbation, and 1-2 comorbidities: obesity, htn  are also affecting patient's functional outcome.    REHAB POTENTIAL: Fair based on patients size   CLINICAL DECISION MAKING: Evolving/moderate complexity   EVALUATION COMPLEXITY: Moderate     GOALS: Goals reviewed with patient? Yes   SHORT TERM GOALS: Target date: 02/19/2022  Pain report to be no  greater than 4/10  Baseline: Goal status: MET   LONG TERM GOALS: Target date: 03/19/2022    Patient to report pain no greater than 2/10  Baseline:  Goal status: INITIAL   2.  Patient to be able to walk 300 feet with rolling walker without knee instability safely Baseline:  Goal status: INITIAL   3.  Patient to be able to do all ADL's independently and safely Baseline:  Goal status: INITIAL   4.  Patient to be able to do basic IADL's independently and safely. Baseline:  Goal status: INITIAL   5.  Patient to report 85% improvement in overall condition and function Baseline:  Goal status: INITIAL       PLAN: PT FREQUENCY: 1-2x/week   PT DURATION: 8 weeks   PLANNED INTERVENTIONS: Therapeutic exercises, Therapeutic activity, Neuromuscular re-education, Balance training, Gait training, Patient/Family education, Self Care, Joint mobilization, Stair training, Aquatic Therapy, Dry Needling, Electrical stimulation, Cryotherapy, Moist heat, Taping, Vasopneumatic device, Ultrasound, Manual therapy, and Re-evaluation   PLAN FOR NEXT SESSION: Aquatics therapy program    Myrene Galas, PTA 04/14/22 7:51 PM

## 2022-04-18 ENCOUNTER — Ambulatory Visit (HOSPITAL_BASED_OUTPATIENT_CLINIC_OR_DEPARTMENT_OTHER): Payer: BC Managed Care – PPO | Admitting: Physical Therapy

## 2022-04-20 ENCOUNTER — Ambulatory Visit: Payer: BC Managed Care – PPO | Admitting: Podiatry

## 2022-05-02 ENCOUNTER — Ambulatory Visit (HOSPITAL_BASED_OUTPATIENT_CLINIC_OR_DEPARTMENT_OTHER): Payer: BC Managed Care – PPO | Attending: Endocrinology | Admitting: Physical Therapy

## 2022-05-02 ENCOUNTER — Encounter (HOSPITAL_BASED_OUTPATIENT_CLINIC_OR_DEPARTMENT_OTHER): Payer: Self-pay | Admitting: Physical Therapy

## 2022-05-02 DIAGNOSIS — M25562 Pain in left knee: Secondary | ICD-10-CM | POA: Diagnosis present

## 2022-05-02 DIAGNOSIS — G8929 Other chronic pain: Secondary | ICD-10-CM | POA: Insufficient documentation

## 2022-05-02 DIAGNOSIS — M25561 Pain in right knee: Secondary | ICD-10-CM | POA: Insufficient documentation

## 2022-05-02 DIAGNOSIS — M6281 Muscle weakness (generalized): Secondary | ICD-10-CM | POA: Insufficient documentation

## 2022-05-02 DIAGNOSIS — R262 Difficulty in walking, not elsewhere classified: Secondary | ICD-10-CM | POA: Insufficient documentation

## 2022-05-02 NOTE — Therapy (Signed)
OUTPATIENT PHYSICAL THERAPY TREATMENT NOTE   Patient Name: Natasha Rollins MRN: [id number] DOB:Aug 23, 1960, 61 y.o., female Today's Date: 05/02/2022  PCP: Jonathon Jordan, MD  REFERRING PROVIDER: Trula Slade, DPM   END OF SESSION:   PT End of Session - 05/02/22 1714     Visit Number 9    Date for PT Re-Evaluation 05/12/22    Authorization Type BCBS    Progress Note Due on Visit 15    PT Start Time 1705    PT Stop Time 1745    PT Time Calculation (min) 40 min    Activity Tolerance Patient limited by pain    Behavior During Therapy [facility] for tasks assessed/performed                 Past Medical History:  Diagnosis Date   Diabetes mellitus without complication ([place])    Fat necrosis 05/28/2018   Hidradenitis suppurativa 05/28/2018   Hypertension    Morbid obesity ([place])    Right second toe ulcer ([place]) 05/28/2018   Past Surgical History:  Procedure Laterality Date   CESAREAN SECTION     x2   CHOLECYSTECTOMY     COLONOSCOPY WITH PROPOFOL N/A 04/06/2022   Procedure: COLONOSCOPY WITH PROPOFOL;  Surgeon: Sharyn Creamer, MD;  Location: [facility];  Service: Gastroenterology;  Laterality: N/A;   HERNIA REPAIR     POLYPECTOMY  04/06/2022   Procedure: POLYPECTOMY;  Surgeon: Sharyn Creamer, MD;  Location: [facility];  Service: Gastroenterology;;   TONSILLECTOMY     Patient Active Problem List   Diagnosis Date Noted   Hyperglycemia due to type 2 diabetes mellitus ([place]) 12/28/2020   Hyperlipidemia 12/28/2020   Non-toxic goiter 12/28/2020   Proteinuria 12/28/2020   Thyroid nodule 12/28/2020   Pain in right hand 03/23/2020   Sprain of foot 06/27/2019   Synovial cyst of right popliteal space 05/15/2019   Spoon shaped nails 07/20/2018   Right second toe ulcer ([place]) 05/28/2018   Hidradenitis suppurativa 05/28/2018   Fat necrosis 05/28/2018   Osteoarthritis of knee 04/17/2018   MRSA (methicillin resistant staph aureus) culture positive 03/29/2018   DVT of lower  extremity, bilateral ([place]) 02/19/2018   Pain in right foot 12/07/2017   Leukocytosis 06/21/2016   Pulmonary emboli ([place]) 06/20/2016   Diabetes ([place]) 12/22/2012   Hypertension 12/22/2012   Morbid obesity with BMI of 60.0-69.9, adult ([place]) 07/08/2010   OVARIAN CYST 07/08/2010    REFERRING DIAG: Difficulty in walking, not elsewhere classified    THERAPY DIAG:  Muscle weakness (generalized)  Difficulty in walking, not elsewhere classified  Chronic pain of right knee  Chronic pain of left knee  Rationale for Evaluation and Treatment Rehabilitation  PERTINENT HISTORY: NA  PRECAUTIONS: None  SUBJECTIVE:   PAIN:  Are you having pain? Yes: NPRS scale: 8/10 Pain location: Bil knees RT > Lt, Rt ankle Pain description: sore, stiff Aggravating factors: WB Relieving factors: NWB   OBJECTIVE: (objective measures completed at initial evaluation unless otherwise dated)   DIAGNOSTIC FINDINGS: none   PATIENT SURVEYS:  Initial eval: LEFS 10/80, 12.5% self percieved ability, 87.5% self percieved disability FOTO 11 (goal is 40) 03/22/22:  LEFS 17/80, 21% self perceived ability, 79% self perceived disability  FOTO : 26 COGNITION:           Overall cognitive status: Within functional limits for tasks assessed  SENSATION: WFL   POSTURE: flexed trunk    PALPATION: Mild crepitus bilateral knees   LOWER EXTREMITY ROM:   WFL   LOWER EXTREMITY MMT:   Generally 4- to 4/5 bilateral LE's   FUNCTIONAL TESTS:  Initial eval: 5 times sit to stand: 25.6 sec (patient could not come to full stand so these reps were not full stand) Timed up and go (TUG): 48.6 sec (leaning fwd on forearms)  03/22/22: 5 times sit to stand: 30.84 sec (patient coming to full stand with upright posture) Timed up and go (TUG): 33.73 sec (Patient upright and not leaning on elbows)   GAIT: Distance walked: 50 feet Assistive device utilized: Environmental consultant - 2 wheeled and Wheelchair  (manual) Level of assistance: Modified independence Comments: antalgic guarded       TODAY'S TREATMENT: 05/02/22: Pt seen for aquatic therapy today.  Treatment took place in water 3.25-4 ft in depth at the [facility] pool. Temp of water was 91.  Pt entered the pool via stairs with step-to pattern and heavy UE on rail; pt exited via chair lift.  Stand pivot transfer from [facility] to stairs or from lift chair with supervision.  * side R/L stepping holding wall  * holding wall:  heel/toe raises x 10, marching in place, hip abdct/addct;  hip ext x 10 each;  squats x 3 (slow and relaxed) * holding white barbell:  forward / backward gait with cues to allow Lt knee to flex during swing through - multiple laps across width of pool;  side stepping R/L  * seated on bench with feet on blue step:  heel/toe raises x 10;  alternating LAQ with DF; cycling with LEs; STS with UE on white barbell x 2 reps (cues for technique)  Pt requires the buoyancy and hydrostatic pressure of water for support, and to offload joints by unweighting joint load by at least 50 % in navel deep water and by at least 75-80% in chest to neck deep water.  Viscosity of the water is needed for resistance of strengthening. Water current perturbations provides challenge to standing balance requiring increased core activation.   04/14/22: Pt arrives for aquatic physical therapy. Treatment took place in 3.5-5.5 feet of water. Water temperature was 92 degrees F. Pt entered the pool via stairs: step to step slowly with heavy use of the rails.getting in and mechanical lift to exit.  Pt requires buoyancy of water for support and to offload joints with strengthening exercises.  Seated water bench with 75% submersion with concurrent pain assessment and discussion of current status.  75% water walking with no assistance: pt could complete 10x in each direction. Patient reports increasing RT knee pain and requested to sit on water bench again.  Sitting on bench: single buoy UE wts for horizontal shoulder abd/add with VC to contract core and glutes 2x10. Lat press/core press with small blue noodle 10x with VC to engage glutes more. Push pull kickboard 10x. Attempted modified child pose holding onto the railings but pt reported too much back pain.     03/28/22:Pt arrives for aquatic physical therapy. Treatment took place in 3.5-5.5 feet of water. Water temperature was 92 degrees F. Pt entered the pool via stairs: step to step slowly with heavy use of the rails. Pt requires buoyancy of water for support and to offload joints with strengthening exercises.  Seated water bench with 75% submersion Pt performed seated LE AROM exercises 20x in all planes, STS x5 onto water step from bench.Cues for  weight shift and gaining immediate standing balance. Pt with increased difficulty today due to increased pain sensitivity Seated LB stretch reaching forward x3 then slightly R/L rotation holding x 10s. 75% depth water walking forward and backward with UE support of with white barbell to decrease LB discomfort. Gait training: cues for knee flex;  heel strike, toe off.      03/22/22: Re-assessment and updated POC (recert)   03/88/82:CM arrives for first aquatic physical therapy. Treatment took place in 3.5-5.5 feet of water. Water temperature was 91 degrees F. Pt entered the pool via stairs: step to step slowly with heavy use of the rails. Pt requires buoyancy of water for support and to offload joints with strengthening exercises.  Seated water bench with 75% submersion Pt performed seated LE AROM exercises 20x in all planes,  75% depth water walking 10x forward, 10x backwards,10x sidestepping no extra AD. Seated RTLE hamstring stretch 3x 10 sec, ankle pumps 20x     HOME EXERCISE PROGRAM: Encouraged patient to continue to try and walk as much as tolerated.     ASSESSMENT:   CLINICAL IMPRESSION: Pt reported significant reduction in back and LE  pain while submerged 75% today. She reported some increase in RLE discomfort with STS and when in  SLS for LLE leg exercise.  Able to complete session taking cues/instruction from therapist on deck.  She reported improved comfort with gait across width of pool with use of UE support on white barbell.  Pt making gradual gains towards remaining goals. Pt has 2 more sessions prior to re-assessment on land.    OBJECTIVE IMPAIRMENTS Abnormal gait, decreased activity tolerance, decreased balance, decreased mobility, difficulty walking, decreased ROM, decreased strength, impaired flexibility, postural dysfunction, obesity, and pain.    ACTIVITY LIMITATIONS carrying, lifting, bending, sitting, standing, squatting, stairs, transfers, bed mobility, bathing, toileting, and dressing   PARTICIPATION LIMITATIONS: meal prep, cleaning, laundry, driving, shopping, community activity, occupation, yard work, and church   [place], Profession, Time since onset of injury/illness/exacerbation, and 1-2 comorbidities: obesity, htn  are also affecting patient's functional outcome.    REHAB POTENTIAL: Fair based on patients size   CLINICAL DECISION MAKING: Evolving/moderate complexity   EVALUATION COMPLEXITY: Moderate     GOALS: Goals reviewed with patient? Yes   SHORT TERM GOALS: Target date: 02/19/2022  Pain report to be no greater than 4/10  Baseline: Goal status: MET   LONG TERM GOALS: Target date: 05/12/21   Patient to report pain no greater than 2/10  Baseline:  Goal status: INITIAL   2.  Patient to be able to walk 300 feet with rolling walker without knee instability safely Baseline:  Goal status: INITIAL   3.  Patient to be able to do all ADL's independently and safely Baseline:  Goal status: INITIAL   4.  Patient to be able to do basic IADL's independently and safely. Baseline:  Goal status: INITIAL   5.  Patient to report 85% improvement in overall condition and  function Baseline:  Goal status: INITIAL       PLAN: PT FREQUENCY: 1-2x/week   PT DURATION: 8 weeks   PLANNED INTERVENTIONS: Therapeutic exercises, Therapeutic activity, Neuromuscular re-education, Balance training, Gait training, Patient/Family education, Self Care, Joint mobilization, Stair training, Aquatic Therapy, Dry Needling, Electrical stimulation, Cryotherapy, Moist heat, Taping, Vasopneumatic device, Ultrasound, Manual therapy, and Re-evaluation   PLAN FOR NEXT SESSION: Aquatics therapy program   Kerin Perna, [place] 05/02/22 6:09 PM [address]  [place], [place], [address] Phone: [phone]   Fax:  [phone]

## 2022-05-04 ENCOUNTER — Ambulatory Visit (HOSPITAL_BASED_OUTPATIENT_CLINIC_OR_DEPARTMENT_OTHER): Payer: BC Managed Care – PPO | Admitting: Physical Therapy

## 2022-05-04 ENCOUNTER — Encounter (HOSPITAL_BASED_OUTPATIENT_CLINIC_OR_DEPARTMENT_OTHER): Payer: Self-pay | Admitting: Physical Therapy

## 2022-05-04 DIAGNOSIS — G8929 Other chronic pain: Secondary | ICD-10-CM

## 2022-05-04 DIAGNOSIS — R262 Difficulty in walking, not elsewhere classified: Secondary | ICD-10-CM

## 2022-05-04 DIAGNOSIS — M6281 Muscle weakness (generalized): Secondary | ICD-10-CM | POA: Diagnosis not present

## 2022-05-04 NOTE — Therapy (Signed)
OUTPATIENT PHYSICAL THERAPY TREATMENT NOTE   Patient Name: Natasha Rollins MRN: [id number] DOB:January 18, 1961, 61 y.o., female Today's Date: 05/04/2022  PCP: Jonathon Jordan, MD  REFERRING PROVIDER: Trula Slade, DPM   END OF SESSION:   PT End of Session - 05/04/22 1617     Visit Number 10    Date for PT Re-Evaluation 05/12/22    Authorization Type BCBS    Progress Note Due on Visit 15    PT Start Time 1615    PT Stop Time 4492    PT Time Calculation (min) 40 min                 Past Medical History:  Diagnosis Date   Diabetes mellitus without complication ([place])    Fat necrosis 05/28/2018   Hidradenitis suppurativa 05/28/2018   Hypertension    Morbid obesity ([place])    Right second toe ulcer ([place]) 05/28/2018   Past Surgical History:  Procedure Laterality Date   CESAREAN SECTION     x2   CHOLECYSTECTOMY     COLONOSCOPY WITH PROPOFOL N/A 04/06/2022   Procedure: COLONOSCOPY WITH PROPOFOL;  Surgeon: Sharyn Creamer, MD;  Location: [facility];  Service: Gastroenterology;  Laterality: N/A;   HERNIA REPAIR     POLYPECTOMY  04/06/2022   Procedure: POLYPECTOMY;  Surgeon: Sharyn Creamer, MD;  Location: [facility];  Service: Gastroenterology;;   TONSILLECTOMY     Patient Active Problem List   Diagnosis Date Noted   Hyperglycemia due to type 2 diabetes mellitus ([place]) 12/28/2020   Hyperlipidemia 12/28/2020   Non-toxic goiter 12/28/2020   Proteinuria 12/28/2020   Thyroid nodule 12/28/2020   Pain in right hand 03/23/2020   Sprain of foot 06/27/2019   Synovial cyst of right popliteal space 05/15/2019   Spoon shaped nails 07/20/2018   Right second toe ulcer ([place]) 05/28/2018   Hidradenitis suppurativa 05/28/2018   Fat necrosis 05/28/2018   Osteoarthritis of knee 04/17/2018   MRSA (methicillin resistant staph aureus) culture positive 03/29/2018   DVT of lower extremity, bilateral ([place]) 02/19/2018   Pain in right foot 12/07/2017   Leukocytosis 06/21/2016    Pulmonary emboli ([place]) 06/20/2016   Diabetes ([place]) 12/22/2012   Hypertension 12/22/2012   Morbid obesity with BMI of 60.0-69.9, adult ([place]) 07/08/2010   OVARIAN CYST 07/08/2010    REFERRING DIAG: Difficulty in walking, not elsewhere classified    THERAPY DIAG:  Muscle weakness (generalized)  Difficulty in walking, not elsewhere classified  Chronic pain of right knee  Chronic pain of left knee  Rationale for Evaluation and Treatment Rehabilitation  PERTINENT HISTORY: NA  PRECAUTIONS: None  SUBJECTIVE: "I'm doing pretty good today."  Pt reports she is trying to stand up a little straighter when rising from the commode.  "I would love to one day walk independently (without device)"  PAIN:  Are you having pain? Yes: NPRS scale: 4/10 Pain location: Bil knees RT > Lt, Rt ankle Pain description: sore, stiff Aggravating factors: WB Relieving factors: NWB   OBJECTIVE: (objective measures completed at initial evaluation unless otherwise dated)   DIAGNOSTIC FINDINGS: none   PATIENT SURVEYS:  Initial eval: LEFS 10/80, 12.5% self percieved ability, 87.5% self percieved disability FOTO 11 (goal is 40) 03/22/22:  LEFS 17/80, 21% self perceived ability, 79% self perceived disability  FOTO : 26 COGNITION:           Overall cognitive status: Within functional limits for tasks assessed  SENSATION: WFL   POSTURE: flexed trunk    PALPATION: Mild crepitus bilateral knees   LOWER EXTREMITY ROM:   WFL   LOWER EXTREMITY MMT:   Generally 4- to 4/5 bilateral LE's   FUNCTIONAL TESTS:  Initial eval: 5 times sit to stand: 25.6 sec (patient could not come to full stand so these reps were not full stand) Timed up and go (TUG): 48.6 sec (leaning fwd on forearms)  03/22/22: 5 times sit to stand: 30.84 sec (patient coming to full stand with upright posture) Timed up and go (TUG): 33.73 sec (Patient upright and not leaning on elbows)   GAIT: Distance  walked: 50 feet Assistive device utilized: Environmental consultant - 2 wheeled and Wheelchair (manual) Level of assistance: Modified independence Comments: antalgic guarded       TODAY'S TREATMENT: 05/04/22: Pt seen for aquatic therapy today.  Treatment took place in water 3.25-4 ft in depth at the [facility] pool. Temp of water was 91.  Pt entered the pool via stairs with step-to pattern and heavy UE on rail; pt exited via chair lift.  Stand pivot transfer from [facility] to stairs or from lift chair with supervision.  * holding white barbell:  forward / backward gait with cues to allow Lt knee to flex during swing through - multiple laps across width of pool;  side stepping R/L 2 laps * seated at bench:  hip abdct/addct x 10; alternating LAQ x 10 with DF * holding wall:  heel/toe raises x 10, marching in place,  hip ext x 10 each * holding white barbell:  return to walking forward / backward * seated on bench with feet on blue step:  STS with UE on white barbell x 5 reps (cues for technique- improved)  Pt requires the buoyancy and hydrostatic pressure of water for support, and to offload joints by unweighting joint load by at least 50 % in navel deep water and by at least 75-80% in chest to neck deep water.  Viscosity of the water is needed for resistance of strengthening. Water current perturbations provides challenge to standing balance requiring increased core activation.  05/02/22: Pt seen for aquatic therapy today.  Treatment took place in water 3.25-4 ft in depth at the [facility] pool. Temp of water was 91.  Pt entered the pool via stairs with step-to pattern and heavy UE on rail; pt exited via chair lift.  Stand pivot transfer from [facility] to stairs or from lift chair with supervision.  * side R/L stepping holding wall  * holding wall:  heel/toe raises x 10, marching in place, hip abdct/addct;  hip ext x 10 each;  squats x 3 (slow and relaxed) * holding white barbell:  forward / backward  gait with cues to allow Lt knee to flex during swing through - multiple laps across width of pool;  side stepping R/L  * seated on bench with feet on blue step:  heel/toe raises x 10;  alternating LAQ with DF; cycling with LEs; STS with UE on white barbell x 2 reps (cues for technique)  Pt requires the buoyancy and hydrostatic pressure of water for support, and to offload joints by unweighting joint load by at least 50 % in navel deep water and by at least 75-80% in chest to neck deep water.  Viscosity of the water is needed for resistance of strengthening. Water current perturbations provides challenge to standing balance requiring increased core activation.   04/14/22: Pt arrives for aquatic physical therapy. Treatment  took place in 3.5-5.5 feet of water. Water temperature was 92 degrees F. Pt entered the pool via stairs: step to step slowly with heavy use of the rails.getting in and mechanical lift to exit.  Pt requires buoyancy of water for support and to offload joints with strengthening exercises.  Seated water bench with 75% submersion with concurrent pain assessment and discussion of current status.  75% water walking with no assistance: pt could complete 10x in each direction. Patient reports increasing RT knee pain and requested to sit on water bench again. Sitting on bench: single buoy UE wts for horizontal shoulder abd/add with VC to contract core and glutes 2x10. Lat press/core press with small blue noodle 10x with VC to engage glutes more. Push pull kickboard 10x. Attempted modified child pose holding onto the railings but pt reported too much back pain.     03/28/22:Pt arrives for aquatic physical therapy. Treatment took place in 3.5-5.5 feet of water. Water temperature was 92 degrees F. Pt entered the pool via stairs: step to step slowly with heavy use of the rails. Pt requires buoyancy of water for support and to offload joints with strengthening exercises.  Seated water bench with 75%  submersion Pt performed seated LE AROM exercises 20x in all planes, STS x5 onto water step from bench.Cues for weight shift and gaining immediate standing balance. Pt with increased difficulty today due to increased pain sensitivity Seated LB stretch reaching forward x3 then slightly R/L rotation holding x 10s. 75% depth water walking forward and backward with UE support of with white barbell to decrease LB discomfort. Gait training: cues for knee flex;  heel strike, toe off.      03/22/22: Re-assessment and updated POC (recert)   34/74/25:ZD arrives for first aquatic physical therapy. Treatment took place in 3.5-5.5 feet of water. Water temperature was 91 degrees F. Pt entered the pool via stairs: step to step slowly with heavy use of the rails. Pt requires buoyancy of water for support and to offload joints with strengthening exercises.  Seated water bench with 75% submersion Pt performed seated LE AROM exercises 20x in all planes,  75% depth water walking 10x forward, 10x backwards,10x sidestepping no extra AD. Seated RTLE hamstring stretch 3x 10 sec, ankle pumps 20x     HOME EXERCISE PROGRAM: Encouraged patient to continue to try and walk as much as tolerated.     ASSESSMENT:   CLINICAL IMPRESSION: Pt reported reduction in back and LE pain to 2/10 while submerged 75-80% today. Able to complete session taking cues/instruction from therapist on deck.  She reported improved comfort with gait across width of pool with use of UE support on white barbell.  Able to complete 5 STS at bench with improved form.  Pt making gradual gains towards remaining goals. Pt has 1 more session  prior to re-assessment on land.    OBJECTIVE IMPAIRMENTS Abnormal gait, decreased activity tolerance, decreased balance, decreased mobility, difficulty walking, decreased ROM, decreased strength, impaired flexibility, postural dysfunction, obesity, and pain.    ACTIVITY LIMITATIONS carrying, lifting, bending,  sitting, standing, squatting, stairs, transfers, bed mobility, bathing, toileting, and dressing   PARTICIPATION LIMITATIONS: meal prep, cleaning, laundry, driving, shopping, community activity, occupation, yard work, and church   [place], Profession, Time since onset of injury/illness/exacerbation, and 1-2 comorbidities: obesity, htn  are also affecting patient's functional outcome.    REHAB POTENTIAL: Fair based on patients size   CLINICAL DECISION MAKING: Evolving/moderate complexity   EVALUATION COMPLEXITY: Moderate  GOALS: Goals reviewed with patient? Yes   SHORT TERM GOALS: Target date: 02/19/2022  Pain report to be no greater than 4/10  Baseline: Goal status: MET   LONG TERM GOALS: Target date: 05/12/21   Patient to report pain no greater than 2/10  Baseline: 4/10 - 05/04/22 Goal status: Ongoing    2.  Patient to be able to walk 300 feet with rolling walker without knee instability safely Baseline: per report - 12 ft with rolling walker in home -05/04/22 Goal status: Ongoing    3.  Patient to be able to do all ADL's independently and safely Baseline: Unable to stand to cook, completes most other tasks in [facility] - 05/04/22 Goal status: Ongoing    4.  Patient to be able to do basic IADL's independently and safely. Baseline:  Goal status: MET -05/04/22   5.  Patient to report 85% improvement in overall condition and function Baseline: 50% reported -05/04/22 Goal status: Ongoing        PLAN: PT FREQUENCY: 1-2x/week   PT DURATION: 8 weeks   PLANNED INTERVENTIONS: Therapeutic exercises, Therapeutic activity, Neuromuscular re-education, Balance training, Gait training, Patient/Family education, Self Care, Joint mobilization, Stair training, Aquatic Therapy, Dry Needling, Electrical stimulation, Cryotherapy, Moist heat, Taping, Vasopneumatic device, Ultrasound, Manual therapy, and Re-evaluation   PLAN FOR NEXT SESSION: Aquatics therapy program    Kerin Perna, [place] 05/04/22 6:11 PM [facility] [address] [place], [place], [address] Phone: [phone]   Fax:  [phone]

## 2022-05-09 ENCOUNTER — Ambulatory Visit (HOSPITAL_BASED_OUTPATIENT_CLINIC_OR_DEPARTMENT_OTHER): Payer: BC Managed Care – PPO | Admitting: Physical Therapy

## 2022-05-12 ENCOUNTER — Ambulatory Visit (HOSPITAL_BASED_OUTPATIENT_CLINIC_OR_DEPARTMENT_OTHER): Payer: BC Managed Care – PPO | Admitting: Physical Therapy

## 2022-05-16 ENCOUNTER — Ambulatory Visit: Payer: BC Managed Care – PPO | Admitting: Podiatry

## 2022-05-16 ENCOUNTER — Ambulatory Visit: Payer: BC Managed Care – PPO

## 2022-05-17 ENCOUNTER — Ambulatory Visit: Payer: BC Managed Care – PPO

## 2022-05-19 ENCOUNTER — Ambulatory Visit: Payer: BC Managed Care – PPO | Attending: Podiatry

## 2022-05-19 DIAGNOSIS — G8929 Other chronic pain: Secondary | ICD-10-CM | POA: Insufficient documentation

## 2022-05-19 DIAGNOSIS — R252 Cramp and spasm: Secondary | ICD-10-CM | POA: Insufficient documentation

## 2022-05-19 DIAGNOSIS — M6281 Muscle weakness (generalized): Secondary | ICD-10-CM | POA: Diagnosis present

## 2022-05-19 DIAGNOSIS — M25562 Pain in left knee: Secondary | ICD-10-CM | POA: Diagnosis present

## 2022-05-19 DIAGNOSIS — R262 Difficulty in walking, not elsewhere classified: Secondary | ICD-10-CM | POA: Insufficient documentation

## 2022-05-19 DIAGNOSIS — M25561 Pain in right knee: Secondary | ICD-10-CM | POA: Insufficient documentation

## 2022-05-19 DIAGNOSIS — R293 Abnormal posture: Secondary | ICD-10-CM | POA: Diagnosis present

## 2022-05-19 NOTE — Therapy (Signed)
OUTPATIENT PHYSICAL THERAPY TREATMENT NOTE   Patient Name: Natasha Rollins MRN: [id number] DOB:[birth year], 62 y.o., female Today's Date: 05/19/2022 Progress Note Reporting Period 03/08/22 to 05/19/22  See note below for Objective Data and Assessment of Progress/Goals.     PCP: Jonathon Jordan, MD  REFERRING PROVIDER: Trula Slade, DPM   END OF SESSION:   PT End of Session - 05/19/22 1024     Visit Number 11    Date for PT Re-Evaluation 07/14/22    Authorization Type BC[employer]    Progress Note Due on Visit 21    PT Start Time 1018    PT Stop Time 1055    PT Time Calculation (min) 37 min    Activity Tolerance Patient limited by pain    Behavior During Therapy [facility] for tasks assessed/performed                 Past Medical History:  Diagnosis Date   Diabetes mellitus without complication ([place])    Fat necrosis 05/28/2018   Hidradenitis suppurativa 05/28/2018   Hypertension    Morbid obesity ([place])    Right second toe ulcer ([place]) 05/28/2018   Past Surgical History:  Procedure Laterality Date   CESAREAN SECTION     x2   CHOLECYSTECTOMY     COLONOSCOPY WITH PROPOFOL N/A 04/06/2022   Procedure: COLONOSCOPY WITH PROPOFOL;  Surgeon: Sharyn Creamer, MD;  Location: [facility];  Service: Gastroenterology;  Laterality: N/A;   HERNIA REPAIR     POLYPECTOMY  04/06/2022   Procedure: POLYPECTOMY;  Surgeon: Sharyn Creamer, MD;  Location: [facility];  Service: Gastroenterology;;   TONSILLECTOMY     Patient Active Problem List   Diagnosis Date Noted   Hyperglycemia due to type 2 diabetes mellitus ([place]) 12/28/2020   Hyperlipidemia 12/28/2020   Non-toxic goiter 12/28/2020   Proteinuria 12/28/2020   Thyroid nodule 12/28/2020   Pain in right hand 03/23/2020   Sprain of foot 06/27/2019   Synovial cyst of right popliteal space 05/15/2019   Spoon shaped nails 07/20/2018   Right second toe ulcer ([place]) 05/28/2018   Hidradenitis suppurativa 05/28/2018   Fat necrosis  05/28/2018   Osteoarthritis of knee 04/17/2018   MRSA (methicillin resistant staph aureus) culture positive 03/29/2018   DVT of lower extremity, bilateral ([place]) 02/19/2018   Pain in right foot 12/07/2017   Leukocytosis 06/21/2016   Pulmonary emboli ([place]) 06/20/2016   Diabetes ([place]) 12/22/2012   Hypertension 12/22/2012   Morbid obesity with BMI of 60.0-69.9, adult ([place]) 07/08/2010   OVARIAN CYST 07/08/2010    REFERRING DIAG: Difficulty in walking, not elsewhere classified    THERAPY DIAG:  Muscle weakness (generalized)  Difficulty in walking, not elsewhere classified  Chronic pain of right knee  Chronic pain of left knee  Cramp and spasm  Abnormal posture  Rationale for Evaluation and Treatment Rehabilitation  PERTINENT HISTORY: NA  PRECAUTIONS: None  SUBJECTIVE: Patient reports being very please with her progress.  She is very excited per her report that she can stand up straight in the pool.  She arrives today using rolling walker vs. Wheelchair.  She has missed a lot of visits due to trouble with her husband being available to take her into the building to get to her PT appts.  She is able to drive but must have her husband or take the chance that someone would be available to come out to her car and help her get her wheelchair out of the car and help her into  the building due to the distance from even handicapped parking to the pool area is too far for her to use her walker.  She rates her pain today at 7/10 but states she knows she will likely continue to have fluctuating pain levels due to her severe arthritis and her weight until she is able to consistently exercise and get her weight down to be a candidate for joint replacement.    PAIN:  Are you having pain? Yes: NPRS scale: 7/10 Pain location: Bil knees RT > Lt, Rt ankle Pain description: sore, stiff Aggravating factors: WB Relieving factors: NWB   OBJECTIVE: (objective measures completed at initial evaluation  unless otherwise dated)   DIAGNOSTIC FINDINGS: none   PATIENT SURVEYS:  Initial eval: LEFS 10/80, 12.5% self percieved ability, 87.5% self percieved disability FOTO 11 (goal is 40)  03/22/22:  LEFS 17/80, 21% self perceived ability, 79% self perceived disability  FOTO : 26  05/19/21:  LEFS 16/80, 22% self perceived ability, 80% self perceived disability  FOTO : 35   COGNITION:           Overall cognitive status: Within functional limits for tasks assessed                          SENSATION: WFL   POSTURE: flexed trunk    PALPATION: Mild crepitus bilateral knees   LOWER EXTREMITY ROM:   WFL   LOWER EXTREMITY MMT:   Generally 4/5 bilateral LE's   FUNCTIONAL TESTS:  Initial eval: 5 times sit to stand: 25.6 sec (patient could not come to full stand so these reps were not full stand) Timed up and go (TUG): 48.6 sec (leaning fwd on forearms)  03/22/22: 5 times sit to stand: 30.84 sec (patient coming to full stand with upright posture) Timed up and go (TUG): 33.73 sec (Patient upright and not leaning on elbows)  05/19/21: 5 times sit to stand: 25.62 sec (patient coming to full stand with upright posture) Timed up and go (TUG): 27.68 sec (Patient upright and not leaning on elbows)   GAIT: Distance walked: 50 feet  (05/19/22 now able to walk approx 100 ft with walker) Assistive device utilized: Environmental consultant - 2 wheeled and Wheelchair (manual) Level of assistance: Modified independence Comments: antalgic guarded       TODAY'S TREATMENT: 05/19/22: Progress note visit: see all testing above Sit to stand x 5, walking for distance with walker   05/04/22: Pt seen for aquatic therapy today.  Treatment took place in water 3.25-4 ft in depth at the [facility] pool. Temp of water was 91.  Pt entered the pool via stairs with step-to pattern and heavy UE on rail; pt exited via chair lift.  Stand pivot transfer from [facility] to stairs or from lift chair with supervision.  *  holding white barbell:  forward / backward gait with cues to allow Lt knee to flex during swing through - multiple laps across width of pool;  side stepping R/L 2 laps * seated at bench:  hip abdct/addct x 10; alternating LAQ x 10 with DF * holding wall:  heel/toe raises x 10, marching in place,  hip ext x 10 each * holding white barbell:  return to walking forward / backward * seated on bench with feet on blue step:  STS with UE on white barbell x 5 reps (cues for technique- improved)  Pt requires the buoyancy and hydrostatic pressure of water for support, and to offload  joints by unweighting joint load by at least 50 % in navel deep water and by at least 75-80% in chest to neck deep water.  Viscosity of the water is needed for resistance of strengthening. Water current perturbations provides challenge to standing balance requiring increased core activation.  05/02/22: Pt seen for aquatic therapy today.  Treatment took place in water 3.25-4 ft in depth at the [employer] pool. Temp of water was 91.  Pt entered the pool via stairs with step-to pattern and heavy UE on rail; pt exited via chair lift.  Stand pivot transfer from [facility] to stairs or from lift chair with supervision.  * side R/L stepping holding wall  * holding wall:  heel/toe raises x 10, marching in place, hip abdct/addct;  hip ext x 10 each;  squats x 3 (slow and relaxed) * holding white barbell:  forward / backward gait with cues to allow Lt knee to flex during swing through - multiple laps across width of pool;  side stepping R/L  * seated on bench with feet on blue step:  heel/toe raises x 10;  alternating LAQ with DF; cycling with LEs; STS with UE on white barbell x 2 reps (cues for technique)  Pt requires the buoyancy and hydrostatic pressure of water for support, and to offload joints by unweighting joint load by at least 50 % in navel deep water and by at least 75-80% in chest to neck deep water.  Viscosity of the water is  needed for resistance of strengthening. Water current perturbations provides challenge to standing balance requiring increased core activation.   04/14/22: Pt arrives for aquatic physical therapy. Treatment took place in 3.5-5.5 feet of water. Water temperature was 92 degrees F. Pt entered the pool via stairs: step to step slowly with heavy use of the rails.getting in and mechanical lift to exit.  Pt requires buoyancy of water for support and to offload joints with strengthening exercises.  Seated water bench with 75% submersion with concurrent pain assessment and discussion of current status.  75% water walking with no assistance: pt could complete 10x in each direction. Patient reports increasing RT knee pain and requested to sit on water bench again. Sitting on bench: single buoy UE wts for horizontal shoulder abd/add with VC to contract core and glutes 2x10. Lat press/core press with small blue noodle 10x with VC to engage glutes more. Push pull kickboard 10x. Attempted modified child pose holding onto the railings but pt reported too much back pain.     03/28/22:Pt arrives for aquatic physical therapy. Treatment took place in 3.5-5.5 feet of water. Water temperature was 92 degrees F. Pt entered the pool via stairs: step to step slowly with heavy use of the rails. Pt requires buoyancy of water for support and to offload joints with strengthening exercises.  Seated water bench with 75% submersion Pt performed seated LE AROM exercises 20x in all planes, STS x5 onto water step from bench.Cues for weight shift and gaining immediate standing balance. Pt with increased difficulty today due to increased pain sensitivity Seated LB stretch reaching forward x3 then slightly R/L rotation holding x 10s. 75% depth water walking forward and backward with UE support of with white barbell to decrease LB discomfort. Gait training: cues for knee flex;  heel strike, toe off.      03/22/22: Re-assessment and updated  POC (recert)   42/70/62:BJ arrives for first aquatic physical therapy. Treatment took place in 3.5-5.5 feet of water. Water temperature was  91 degrees F. Pt entered the pool via stairs: step to step slowly with heavy use of the rails. Pt requires buoyancy of water for support and to offload joints with strengthening exercises.  Seated water bench with 75% submersion Pt performed seated LE AROM exercises 20x in all planes,  75% depth water walking 10x forward, 10x backwards,10x sidestepping no extra AD. Seated RTLE hamstring stretch 3x 10 sec, ankle pumps 20x     HOME EXERCISE PROGRAM: Encouraged patient to continue to try and walk as much as tolerated.     ASSESSMENT:   CLINICAL IMPRESSION: Vanessia continues to make progress including more time using walker vs. Riding in wheelchair.  She feels she is consistently able to stay upright in the pool and is able to do higher level exercises over the past few visits.  She is limited by her ability to schedule around her husband but they are working it out for her to be there.  Objective findings are improved as well as functional outcomes and tests.  She is well motivated and compliant.  She would benefit from continued skilled PT for aquatic therapy to allow her to reach a level of independence in a program that she will be able to progress with on her own once Dc'd from formal PT.     OBJECTIVE IMPAIRMENTS Abnormal gait, decreased activity tolerance, decreased balance, decreased mobility, difficulty walking, decreased ROM, decreased strength, impaired flexibility, postural dysfunction, obesity, and pain.    ACTIVITY LIMITATIONS carrying, lifting, bending, sitting, standing, squatting, stairs, transfers, bed mobility, bathing, toileting, and dressing   PARTICIPATION LIMITATIONS: meal prep, cleaning, laundry, driving, shopping, community activity, occupation, yard work, and church   [place], Profession, Time since onset of  injury/illness/exacerbation, and 1-2 comorbidities: obesity, htn  are also affecting patient's functional outcome.    REHAB POTENTIAL: Fair based on patients size   CLINICAL DECISION MAKING: Evolving/moderate complexity   EVALUATION COMPLEXITY: Moderate     GOALS: Goals reviewed with patient? Yes   SHORT TERM GOALS: Target date: 02/19/2022  Pain report to be no greater than 4/10  Baseline: Goal status: MET   LONG TERM GOALS: Target date: 07/14/22   Patient to report pain no greater than 2/10  Baseline: 4/10 - 05/04/22, 7/10 on 05/19/22 Goal status: Ongoing    2.  Patient to be able to walk 300 feet with rolling walker without knee instability safely Baseline: per report - 12 ft with rolling walker in home -05/04/22    100 ft on level surfaces - 05/19/22 Goal status: Ongoing    3.  Patient to be able to do all ADL's independently and safely Baseline: Unable to stand to cook, completes most other tasks in [facility] - 05/04/22 Goal status: Ongoing    4.  Patient to be able to do basic IADL's independently and safely. Baseline:  Goal status: MET -05/04/22   5.  Patient to report 85% improvement in overall condition and function Baseline: 50% reported -05/04/22 Goal status: Ongoing        PLAN: PT FREQUENCY: 1-2x/week   PT DURATION: for 8 additional weeks from 05/19/22 through 07/14/22   PLANNED INTERVENTIONS: Therapeutic exercises, Therapeutic activity, Neuromuscular re-education, Balance training, Gait training, Patient/Family education, Self Care, Joint mobilization, Stair training, Aquatic Therapy, Dry Needling, Electrical stimulation, Cryotherapy, Moist heat, Taping, Vasopneumatic device, Ultrasound, Manual therapy, and Re-evaluation   PLAN FOR NEXT SESSION: Aquatics therapy program   [place] B. Jhamari Markowicz, PT 05/19/22 11:32 AM  [facility] [phone]  [address] [place], [place] [address] Phone # [phone] Fax [phone]

## 2022-05-26 ENCOUNTER — Ambulatory Visit: Payer: Self-pay | Admitting: General Surgery

## 2022-06-01 ENCOUNTER — Ambulatory Visit: Payer: BC Managed Care – PPO | Admitting: Podiatry

## 2022-06-01 ENCOUNTER — Encounter: Payer: Self-pay | Admitting: Student

## 2022-06-01 ENCOUNTER — Ambulatory Visit (INDEPENDENT_AMBULATORY_CARE_PROVIDER_SITE_OTHER): Payer: BC Managed Care – PPO

## 2022-06-01 DIAGNOSIS — M79671 Pain in right foot: Secondary | ICD-10-CM

## 2022-06-01 DIAGNOSIS — M25571 Pain in right ankle and joints of right foot: Secondary | ICD-10-CM

## 2022-06-01 DIAGNOSIS — S92415D Nondisplaced fracture of proximal phalanx of left great toe, subsequent encounter for fracture with routine healing: Secondary | ICD-10-CM

## 2022-06-01 DIAGNOSIS — G8929 Other chronic pain: Secondary | ICD-10-CM | POA: Diagnosis not present

## 2022-06-01 DIAGNOSIS — M7751 Other enthesopathy of right foot: Secondary | ICD-10-CM | POA: Diagnosis not present

## 2022-06-01 DIAGNOSIS — M79605 Pain in left leg: Secondary | ICD-10-CM

## 2022-06-01 DIAGNOSIS — M25572 Pain in left ankle and joints of left foot: Secondary | ICD-10-CM

## 2022-06-01 DIAGNOSIS — G629 Polyneuropathy, unspecified: Secondary | ICD-10-CM | POA: Diagnosis not present

## 2022-06-01 MED ORDER — TRIAMCINOLONE ACETONIDE 10 MG/ML IJ SUSP
10.0000 mg | Freq: Once | INTRAMUSCULAR | Status: AC
  Administered 2022-06-01: 10 mg

## 2022-06-01 NOTE — Progress Notes (Signed)
Subjective: Chief Complaint  Patient presents with   Follow-up    R ankle and L 4-5 toes sore, patient stated she had an injection in the ankle before     62 year old female presents the office today for follow evaluation of fracture of left foot.  She states that she had an injection in her back and this helped her knees as well as her foot.  This lasted for about 4 months.  She points to the lateral aspect the right foot where she gets discomfort but she is also still concerned for her left fourth toe.  No new injuries or concerns that she reports.  No open sores.  She feels on her right foot she gets a sensation of her pain on her second and third toes.   Objective: AAO x3, NAD DP/PT pulses palpable bilaterally, CRT less than 3 seconds On the right side the majority tenderness is localized on the sinus tarsi.  There is trace edema there is no erythema or warmth but there is no area pinpoint tenderness on the right side. On the left foot there is still tenderness on the fourth toe and some residual edema but overall appears to be improving.  No significant pain on exam.  Close foot slightly deviated in the transverse plane.  There is no open lesions present bilaterally. No pain with calf compression, swelling, warmth, erythema  Assessment: 62 year old female left fourth toe fracture, capsulitis right foot  Plan: -All treatment options discussed with the patient including all alternatives, risks, complications.  -X-rays obtained reviewed left foot.  3 views were obtained.  Increased consolidation across the fracture of the fourth toe. -Continue aquatic therapy which is beneficial. -Discussed transition to regular shoe as tolerated.  She feels more comfortable in surgical shoe but hopefully as she continues with therapy she is back to regular shoe as well. -She also received another steroid injection of the right foot to the sinus tarsi.  Discussed risks of repeated injections but it does  help her walk better.  Skin was cleaned with Betadine and alcohol mixture 1 cc Kenalog 10, 0.5 cc of Marcaine plain, 0.5 cc of lidocaine plain was infiltrated into the sinus tarsi along the area of maximal tenderness without complications.  Postinjection care discussed.  Tolerated well. -Presents for injection between her second and third toes of the right foot.  If her symptoms persist consider the injection next appointment.  Trula Slade DPM

## 2022-06-09 ENCOUNTER — Ambulatory Visit: Payer: BC Managed Care – PPO | Attending: Podiatry | Admitting: Physical Therapy

## 2022-06-09 ENCOUNTER — Encounter: Payer: Self-pay | Admitting: Physical Therapy

## 2022-06-09 DIAGNOSIS — M6281 Muscle weakness (generalized): Secondary | ICD-10-CM | POA: Diagnosis present

## 2022-06-09 DIAGNOSIS — M25561 Pain in right knee: Secondary | ICD-10-CM | POA: Insufficient documentation

## 2022-06-09 DIAGNOSIS — R293 Abnormal posture: Secondary | ICD-10-CM

## 2022-06-09 DIAGNOSIS — G8929 Other chronic pain: Secondary | ICD-10-CM | POA: Insufficient documentation

## 2022-06-09 DIAGNOSIS — R252 Cramp and spasm: Secondary | ICD-10-CM | POA: Diagnosis present

## 2022-06-09 DIAGNOSIS — M25562 Pain in left knee: Secondary | ICD-10-CM | POA: Insufficient documentation

## 2022-06-09 DIAGNOSIS — R262 Difficulty in walking, not elsewhere classified: Secondary | ICD-10-CM | POA: Diagnosis present

## 2022-06-09 NOTE — Therapy (Signed)
OUTPATIENT PHYSICAL THERAPY TREATMENT NOTE   Patient Name: Natasha Rollins MRN: [id number] DOB:[birth year], 62 y.o., female Today's Date: 06/09/2022.     PCP: Jonathon Jordan, MD  REFERRING PROVIDER: Trula Slade, DPM   END OF SESSION:   PT End of Session - 06/09/22 1635     Visit Number 12    Date for PT Re-Evaluation 07/14/22    Authorization Type BC[employer]    Authorization - Visit Number 6    Progress Note Due on Visit 21    PT Start Time 1320   20 min late   PT Stop Time 1345    PT Time Calculation (min) 25 min    Activity Tolerance Patient limited by fatigue;Patient limited by pain   limited secondary to being late   Behavior During Therapy [facility] for tasks assessed/performed                  Past Medical History:  Diagnosis Date   Diabetes mellitus without complication ([place])    Fat necrosis 05/28/2018   Hidradenitis suppurativa 05/28/2018   Hypertension    Morbid obesity ([place])    Right second toe ulcer ([place]) 05/28/2018   Past Surgical History:  Procedure Laterality Date   CESAREAN SECTION     x2   CHOLECYSTECTOMY     COLONOSCOPY WITH PROPOFOL N/A 04/06/2022   Procedure: COLONOSCOPY WITH PROPOFOL;  Surgeon: Sharyn Creamer, MD;  Location: [facility];  Service: Gastroenterology;  Laterality: N/A;   HERNIA REPAIR     POLYPECTOMY  04/06/2022   Procedure: POLYPECTOMY;  Surgeon: Sharyn Creamer, MD;  Location: [facility];  Service: Gastroenterology;;   TONSILLECTOMY     Patient Active Problem List   Diagnosis Date Noted   Hyperglycemia due to type 2 diabetes mellitus ([place]) 12/28/2020   Hyperlipidemia 12/28/2020   Non-toxic goiter 12/28/2020   Proteinuria 12/28/2020   Thyroid nodule 12/28/2020   Pain in right hand 03/23/2020   Sprain of foot 06/27/2019   Synovial cyst of right popliteal space 05/15/2019   Spoon shaped nails 07/20/2018   Right second toe ulcer ([place]) 05/28/2018   Hidradenitis suppurativa 05/28/2018   Fat necrosis 05/28/2018    Osteoarthritis of knee 04/17/2018   MRSA (methicillin resistant staph aureus) culture positive 03/29/2018   DVT of lower extremity, bilateral ([place]) 02/19/2018   Pain in right foot 12/07/2017   Leukocytosis 06/21/2016   Pulmonary emboli ([place]) 06/20/2016   Diabetes ([place]) 12/22/2012   Hypertension 12/22/2012   Morbid obesity with BMI of 60.0-69.9, adult ([place]) 07/08/2010   OVARIAN CYST 07/08/2010    REFERRING DIAG: Difficulty in walking, not elsewhere classified    THERAPY DIAG:  Muscle weakness (generalized)  Chronic pain of right knee  Chronic pain of left knee  Cramp and spasm  Difficulty in walking, not elsewhere classified  Abnormal posture  Rationale for Evaluation and Treatment Rehabilitation  PERTINENT HISTORY: NA  PRECAUTIONS: None  SUBJECTIVE: I am sorry I am late again but when a kid needs me at school I am going to help them.   PAIN:  Are you having pain? Yes: NPRS scale: 7/10 Pain location: Bil knees RT > Lt, Rt ankle Pain description: sore, stiff Aggravating factors: WB Relieving factors: NWB   OBJECTIVE: (objective measures completed at initial evaluation unless otherwise dated)   DIAGNOSTIC FINDINGS: none   PATIENT SURVEYS:  Initial eval: LEFS 10/80, 12.5% self percieved ability, 87.5% self percieved disability FOTO 11 (goal is 40)  03/22/22:  LEFS 17/80, 21% self  perceived ability, 79% self perceived disability  FOTO : 26  05/19/21:  LEFS 16/80, 22% self perceived ability, 80% self perceived disability  FOTO : 35   COGNITION:           Overall cognitive status: Within functional limits for tasks assessed                          SENSATION: WFL   POSTURE: flexed trunk    PALPATION: Mild crepitus bilateral knees   LOWER EXTREMITY ROM:   WFL   LOWER EXTREMITY MMT:   Generally 4/5 bilateral LE's   FUNCTIONAL TESTS:  Initial eval: 5 times sit to stand: 25.6 sec (patient could not come to full stand so these reps were not full  stand) Timed up and go (TUG): 48.6 sec (leaning fwd on forearms)  03/22/22: 5 times sit to stand: 30.84 sec (patient coming to full stand with upright posture) Timed up and go (TUG): 33.73 sec (Patient upright and not leaning on elbows)  05/19/21: 5 times sit to stand: 25.62 sec (patient coming to full stand with upright posture) Timed up and go (TUG): 27.68 sec (Patient upright and not leaning on elbows)   GAIT: Distance walked: 50 feet  (05/19/22 now able to walk approx 100 ft with walker) Assistive device utilized: Environmental consultant - 2 wheeled and Wheelchair (manual) Level of assistance: Modified independence Comments: antalgic guarded       TODAY'S TREATMENT:  06/09/22:Pt arrives for aquatic physical therapy. Treatment took place in 3.5-5.5 feet of water. Water temperature was 91 degrees F. Pt entered the pool via stairs, stepping sideways then backwards holding onto 1 rail with both hands. Pt requires buoyancy of water for support and to offload joints with strengthening exercises.   75% depth: water walking for upright posture and back strength. 4 lengths in each direction then had to sit secondary to back pain and fatigue. Seated LAQ 10x Bil, semi-sit shoulder flex/ext VC to sit tall 10x, ankle DF/PF 10x Bil.    05/19/22: Progress note visit: see all testing above Sit to stand x 5, walking for distance with walker   05/04/22: Pt seen for aquatic therapy today.  Treatment took place in water 3.25-4 ft in depth at the [facility] pool. Temp of water was 91.  Pt entered the pool via stairs with step-to pattern and heavy UE on rail; pt exited via chair lift.  Stand pivot transfer from [facility] to stairs or from lift chair with supervision.  * holding white barbell:  forward / backward gait with cues to allow Lt knee to flex during swing through - multiple laps across width of pool;  side stepping R/L 2 laps * seated at bench:  hip abdct/addct x 10; alternating LAQ x 10 with DF * holding  wall:  heel/toe raises x 10, marching in place,  hip ext x 10 each * holding white barbell:  return to walking forward / backward * seated on bench with feet on blue step:  STS with UE on white barbell x 5 reps (cues for technique- improved)  Pt requires the buoyancy and hydrostatic pressure of water for support, and to offload joints by unweighting joint load by at least 50 % in navel deep water and by at least 75-80% in chest to neck deep water.  Viscosity of the water is needed for resistance of strengthening. Water current perturbations provides challenge to standing balance requiring increased core activation.  HOME EXERCISE  PROGRAM: Encouraged patient to continue to try and walk as much as tolerated.     ASSESSMENT:   CLINICAL IMPRESSION: Pt arrives 20 min late then needed to change into her swim ware which took additional time. Pt appears fatigued from her day at school and did not have the usual excitement to exercise in the water today. Time was main limiting factor but pt was forced to sit due to RT back and leg pain.    OBJECTIVE IMPAIRMENTS Abnormal gait, decreased activity tolerance, decreased balance, decreased mobility, difficulty walking, decreased ROM, decreased strength, impaired flexibility, postural dysfunction, obesity, and pain.    ACTIVITY LIMITATIONS carrying, lifting, bending, sitting, standing, squatting, stairs, transfers, bed mobility, bathing, toileting, and dressing   PARTICIPATION LIMITATIONS: meal prep, cleaning, laundry, driving, shopping, community activity, occupation, yard work, and church   [place], Profession, Time since onset of injury/illness/exacerbation, and 1-2 comorbidities: obesity, htn  are also affecting patient's functional outcome.    REHAB POTENTIAL: Fair based on patients size   CLINICAL DECISION MAKING: Evolving/moderate complexity   EVALUATION COMPLEXITY: Moderate     GOALS: Goals reviewed with patient? Yes   SHORT  TERM GOALS: Target date: 02/19/2022  Pain report to be no greater than 4/10  Baseline: Goal status: MET   LONG TERM GOALS: Target date: 07/14/22   Patient to report pain no greater than 2/10  Baseline: 4/10 - 05/04/22, 7/10 on 05/19/22 Goal status: Ongoing    2.  Patient to be able to walk 300 feet with rolling walker without knee instability safely Baseline: per report - 12 ft with rolling walker in home -05/04/22    100 ft on level surfaces - 05/19/22 Goal status: Ongoing    3.  Patient to be able to do all ADL's independently and safely Baseline: Unable to stand to cook, completes most other tasks in [facility] - 05/04/22 Goal status: Ongoing    4.  Patient to be able to do basic IADL's independently and safely. Baseline:  Goal status: MET -05/04/22   5.  Patient to report 85% improvement in overall condition and function Baseline: 50% reported -05/04/22 Goal status: Ongoing        PLAN: PT FREQUENCY: 1-2x/week   PT DURATION: for 8 additional weeks from 05/19/22 through 07/14/22   PLANNED INTERVENTIONS: Therapeutic exercises, Therapeutic activity, Neuromuscular re-education, Balance training, Gait training, Patient/Family education, Self Care, Joint mobilization, Stair training, Aquatic Therapy, Dry Needling, Electrical stimulation, Cryotherapy, Moist heat, Taping, Vasopneumatic device, Ultrasound, Manual therapy, and Re-evaluation   PLAN FOR NEXT SESSION: Aquatics therapy program.  Myrene Galas, PTA 06/09/22 4:36 PM    [facility] [address], [address], [place] [address] Phone # [phone] Fax [phone]

## 2022-06-22 NOTE — Therapy (Deleted)
OUTPATIENT PHYSICAL THERAPY TREATMENT NOTE   Patient Name: Natasha Rollins MRN: [id number] DOB:06/01/1960, 62 y.o., female Today's Date: 06/22/2022.     PCP: Jonathon Jordan, MD  REFERRING PROVIDER: Trula Slade, DPM   END OF SESSION:          Past Medical History:  Diagnosis Date   Diabetes mellitus without complication ([place])    Fat necrosis 05/28/2018   Hidradenitis suppurativa 05/28/2018   Hypertension    Morbid obesity ([place])    Right second toe ulcer ([place]) 05/28/2018   Past Surgical History:  Procedure Laterality Date   CESAREAN SECTION     x2   CHOLECYSTECTOMY     COLONOSCOPY WITH PROPOFOL N/A 04/06/2022   Procedure: COLONOSCOPY WITH PROPOFOL;  Surgeon: Sharyn Creamer, MD;  Location: [facility];  Service: Gastroenterology;  Laterality: N/A;   HERNIA REPAIR     POLYPECTOMY  04/06/2022   Procedure: POLYPECTOMY;  Surgeon: Sharyn Creamer, MD;  Location: [facility];  Service: Gastroenterology;;   TONSILLECTOMY     Patient Active Problem List   Diagnosis Date Noted   Hyperglycemia due to type 2 diabetes mellitus ([place]) 12/28/2020   Hyperlipidemia 12/28/2020   Non-toxic goiter 12/28/2020   Proteinuria 12/28/2020   Thyroid nodule 12/28/2020   Pain in right hand 03/23/2020   Sprain of foot 06/27/2019   Synovial cyst of right popliteal space 05/15/2019   Spoon shaped nails 07/20/2018   Right second toe ulcer ([place]) 05/28/2018   Hidradenitis suppurativa 05/28/2018   Fat necrosis 05/28/2018   Osteoarthritis of knee 04/17/2018   MRSA (methicillin resistant staph aureus) culture positive 03/29/2018   DVT of lower extremity, bilateral ([place]) 02/19/2018   Pain in right foot 12/07/2017   Leukocytosis 06/21/2016   Pulmonary emboli ([place]) 06/20/2016   Diabetes ([place]) 12/22/2012   Hypertension 12/22/2012   Morbid obesity with BMI of 60.0-69.9, adult ([place]) 07/08/2010   OVARIAN CYST 07/08/2010    REFERRING DIAG: Difficulty in walking, not elsewhere classified     THERAPY DIAG:  Muscle weakness (generalized)  Chronic pain of right knee  Cramp and spasm  Chronic pain of left knee  Difficulty in walking, not elsewhere classified  Abnormal posture  Rationale for Evaluation and Treatment Rehabilitation  PERTINENT HISTORY: NA  PRECAUTIONS: None  SUBJECTIVE: I am sorry I am late again but when a kid needs me at school I am going to help them.   PAIN:  Are you having pain? Yes: NPRS scale: 7/10 Pain location: Bil knees RT > Lt, Rt ankle Pain description: sore, stiff Aggravating factors: WB Relieving factors: NWB   OBJECTIVE: (objective measures completed at initial evaluation unless otherwise dated)   DIAGNOSTIC FINDINGS: none   PATIENT SURVEYS:  Initial eval: LEFS 10/80, 12.5% self percieved ability, 87.5% self percieved disability FOTO 11 (goal is 40)  03/22/22:  LEFS 17/80, 21% self perceived ability, 79% self perceived disability  FOTO : 26  05/19/21:  LEFS 16/80, 22% self perceived ability, 80% self perceived disability  FOTO : 35   COGNITION:           Overall cognitive status: Within functional limits for tasks assessed                          SENSATION: WFL   POSTURE: flexed trunk    PALPATION: Mild crepitus bilateral knees   LOWER EXTREMITY ROM:   WFL   LOWER EXTREMITY MMT:   Generally 4/5 bilateral LE's  FUNCTIONAL TESTS:  Initial eval: 5 times sit to stand: 25.6 sec (patient could not come to full stand so these reps were not full stand) Timed up and go (TUG): 48.6 sec (leaning fwd on forearms)  03/22/22: 5 times sit to stand: 30.84 sec (patient coming to full stand with upright posture) Timed up and go (TUG): 33.73 sec (Patient upright and not leaning on elbows)  05/19/21: 5 times sit to stand: 25.62 sec (patient coming to full stand with upright posture) Timed up and go (TUG): 27.68 sec (Patient upright and not leaning on elbows)   GAIT: Distance walked: 50 feet  (05/19/22 now able to  walk approx 100 ft with walker) Assistive device utilized: Environmental consultant - 2 wheeled and Wheelchair (manual) Level of assistance: Modified independence Comments: antalgic guarded       TODAY'S TREATMENT:  06/09/22:Pt arrives for aquatic physical therapy. Treatment took place in 3.5-5.5 feet of water. Water temperature was 91 degrees F. Pt entered the pool via stairs, stepping sideways then backwards holding onto 1 rail with both hands. Pt requires buoyancy of water for support and to offload joints with strengthening exercises.   75% depth: water walking for upright posture and back strength. 4 lengths in each direction then had to sit secondary to back pain and fatigue. Seated LAQ 10x Bil, semi-sit shoulder flex/ext VC to sit tall 10x, ankle DF/PF 10x Bil.    05/19/22: Progress note visit: see all testing above Sit to stand x 5, walking for distance with walker   05/04/22: Pt seen for aquatic therapy today.  Treatment took place in water 3.25-4 ft in depth at the [facility] pool. Temp of water was 91.  Pt entered the pool via stairs with step-to pattern and heavy UE on rail; pt exited via chair lift.  Stand pivot transfer from [facility] to stairs or from lift chair with supervision.  * holding white barbell:  forward / backward gait with cues to allow Lt knee to flex during swing through - multiple laps across width of pool;  side stepping R/L 2 laps * seated at bench:  hip abdct/addct x 10; alternating LAQ x 10 with DF * holding wall:  heel/toe raises x 10, marching in place,  hip ext x 10 each * holding white barbell:  return to walking forward / backward * seated on bench with feet on blue step:  STS with UE on white barbell x 5 reps (cues for technique- improved)  Pt requires the buoyancy and hydrostatic pressure of water for support, and to offload joints by unweighting joint load by at least 50 % in navel deep water and by at least 75-80% in chest to neck deep water.  Viscosity of the  water is needed for resistance of strengthening. Water current perturbations provides challenge to standing balance requiring increased core activation.  HOME EXERCISE PROGRAM: Encouraged patient to continue to try and walk as much as tolerated.     ASSESSMENT:   CLINICAL IMPRESSION: Pt arrives 20 min late then needed to change into her swim ware which took additional time. Pt appears fatigued from her day at school and did not have the usual excitement to exercise in the water today. Time was main limiting factor but pt was forced to sit due to RT back and leg pain.    OBJECTIVE IMPAIRMENTS Abnormal gait, decreased activity tolerance, decreased balance, decreased mobility, difficulty walking, decreased ROM, decreased strength, impaired flexibility, postural dysfunction, obesity, and pain.    ACTIVITY LIMITATIONS  carrying, lifting, bending, sitting, standing, squatting, stairs, transfers, bed mobility, bathing, toileting, and dressing   PARTICIPATION LIMITATIONS: meal prep, cleaning, laundry, driving, shopping, community activity, occupation, yard work, and church   [place], Profession, Time since onset of injury/illness/exacerbation, and 1-2 comorbidities: obesity, htn  are also affecting patient's functional outcome.    REHAB POTENTIAL: Fair based on patients size   CLINICAL DECISION MAKING: Evolving/moderate complexity   EVALUATION COMPLEXITY: Moderate     GOALS: Goals reviewed with patient? Yes   SHORT TERM GOALS: Target date: 02/19/2022  Pain report to be no greater than 4/10  Baseline: Goal status: MET   LONG TERM GOALS: Target date: 07/14/22   Patient to report pain no greater than 2/10  Baseline: 4/10 - 05/04/22, 7/10 on 05/19/22 Goal status: Ongoing    2.  Patient to be able to walk 300 feet with rolling walker without knee instability safely Baseline: per report - 12 ft with rolling walker in home -05/04/22    100 ft on level surfaces - 05/19/22 Goal  status: Ongoing    3.  Patient to be able to do all ADL's independently and safely Baseline: Unable to stand to cook, completes most other tasks in [facility] - 05/04/22 Goal status: Ongoing    4.  Patient to be able to do basic IADL's independently and safely. Baseline:  Goal status: MET -05/04/22   5.  Patient to report 85% improvement in overall condition and function Baseline: 50% reported -05/04/22 Goal status: Ongoing        PLAN: PT FREQUENCY: 1-2x/week   PT DURATION: for 8 additional weeks from 05/19/22 through 07/14/22   PLANNED INTERVENTIONS: Therapeutic exercises, Therapeutic activity, Neuromuscular re-education, Balance training, Gait training, Patient/Family education, Self Care, Joint mobilization, Stair training, Aquatic Therapy, Dry Needling, Electrical stimulation, Cryotherapy, Moist heat, Taping, Vasopneumatic device, Ultrasound, Manual therapy, and Re-evaluation   PLAN FOR NEXT SESSION: Aquatics therapy program.  Myrene Galas, PTA 06/22/22 9:48 PM    [facility] [address] [place] [place], [place] [address] Phone # [phone] Fax [phone]

## 2022-06-23 ENCOUNTER — Ambulatory Visit: Payer: BC Managed Care – PPO | Admitting: Physical Therapy

## 2022-06-23 ENCOUNTER — Emergency Department (HOSPITAL_BASED_OUTPATIENT_CLINIC_OR_DEPARTMENT_OTHER)
Admission: EM | Admit: 2022-06-23 | Discharge: 2022-06-23 | Disposition: A | Discharge status: Home / Self Care | Payer: BC Managed Care – PPO | Attending: Emergency Medicine | Admitting: Emergency Medicine

## 2022-06-23 ENCOUNTER — Emergency Department (HOSPITAL_BASED_OUTPATIENT_CLINIC_OR_DEPARTMENT_OTHER): Payer: BC Managed Care – PPO | Admitting: Radiology

## 2022-06-23 ENCOUNTER — Encounter (HOSPITAL_BASED_OUTPATIENT_CLINIC_OR_DEPARTMENT_OTHER): Payer: Self-pay

## 2022-06-23 ENCOUNTER — Other Ambulatory Visit: Payer: Self-pay

## 2022-06-23 DIAGNOSIS — Z7901 Long term (current) use of anticoagulants: Secondary | ICD-10-CM | POA: Insufficient documentation

## 2022-06-23 DIAGNOSIS — G8929 Other chronic pain: Secondary | ICD-10-CM

## 2022-06-23 DIAGNOSIS — Z7984 Long term (current) use of oral hypoglycemic drugs: Secondary | ICD-10-CM | POA: Insufficient documentation

## 2022-06-23 DIAGNOSIS — R262 Difficulty in walking, not elsewhere classified: Secondary | ICD-10-CM

## 2022-06-23 DIAGNOSIS — R059 Cough, unspecified: Secondary | ICD-10-CM | POA: Diagnosis present

## 2022-06-23 DIAGNOSIS — U071 COVID-19: Secondary | ICD-10-CM | POA: Insufficient documentation

## 2022-06-23 DIAGNOSIS — D649 Anemia, unspecified: Secondary | ICD-10-CM | POA: Insufficient documentation

## 2022-06-23 DIAGNOSIS — R051 Acute cough: Secondary | ICD-10-CM

## 2022-06-23 DIAGNOSIS — I10 Essential (primary) hypertension: Secondary | ICD-10-CM | POA: Insufficient documentation

## 2022-06-23 DIAGNOSIS — M6281 Muscle weakness (generalized): Secondary | ICD-10-CM

## 2022-06-23 DIAGNOSIS — R252 Cramp and spasm: Secondary | ICD-10-CM

## 2022-06-23 DIAGNOSIS — Z79899 Other long term (current) drug therapy: Secondary | ICD-10-CM | POA: Diagnosis not present

## 2022-06-23 DIAGNOSIS — R293 Abnormal posture: Secondary | ICD-10-CM

## 2022-06-23 DIAGNOSIS — E119 Type 2 diabetes mellitus without complications: Secondary | ICD-10-CM | POA: Diagnosis not present

## 2022-06-23 LAB — BASIC METABOLIC PANEL
Anion gap: 9 (ref 5–15)
BUN: 20 mg/dL (ref 8–23)
CO2: 29 mmol/L (ref 22–32)
Calcium: 9.5 mg/dL (ref 8.9–10.3)
Chloride: 98 mmol/L (ref 98–111)
Creatinine, Ser: 0.92 mg/dL (ref 0.44–1.00)
GFR, Estimated: >60 mL/min (ref >60)
Glucose, Bld: 102 mg/dL — ABNORMAL HIGH (ref 70–99)
Potassium: 4.1 mmol/L (ref 3.5–5.1)
Sodium: 136 mmol/L (ref 135–145)

## 2022-06-23 LAB — CBC WITH DIFFERENTIAL/PLATELET
Abs Immature Granulocytes: 0.11 10*3/uL — ABNORMAL HIGH (ref 0.00–0.07)
Basophils Absolute: 0 10*3/uL (ref 0.0–0.1)
Basophils Relative: 0 %
Eosinophils Absolute: 0.1 10*3/uL (ref 0.0–0.5)
Eosinophils Relative: 1 %
HCT: 34.6 % — ABNORMAL LOW (ref 36.0–46.0)
Hemoglobin: 10.5 g/dL — ABNORMAL LOW (ref 12.0–15.0)
Immature Granulocytes: 1 %
Lymphocytes Relative: 15 %
Lymphs Abs: 1.5 10*3/uL (ref 0.7–4.0)
MCH: 25.3 pg — ABNORMAL LOW (ref 26.0–34.0)
MCHC: 30.3 g/dL (ref 30.0–36.0)
MCV: 83.4 fL (ref 80.0–100.0)
Monocytes Absolute: 1.1 10*3/uL — ABNORMAL HIGH (ref 0.1–1.0)
Monocytes Relative: 12 %
Neutro Abs: 6.9 10*3/uL (ref 1.7–7.7)
Neutrophils Relative %: 71 %
Platelets: 358 10*3/uL (ref 150–400)
RBC: 4.15 MIL/uL (ref 3.87–5.11)
RDW: 16.3 % — ABNORMAL HIGH (ref 11.5–15.5)
WBC: 9.8 10*3/uL (ref 4.0–10.5)
nRBC: 0 % (ref 0.0–0.2)

## 2022-06-23 LAB — PROTIME-INR
INR: 2.2 — ABNORMAL HIGH (ref 0.8–1.2)
Prothrombin Time: 23.9 seconds — ABNORMAL HIGH (ref 11.4–15.2)

## 2022-06-23 MED ORDER — ALBUTEROL SULFATE HFA 108 (90 BASE) MCG/ACT IN AERS
2.0000 | INHALATION_SPRAY | RESPIRATORY_TRACT | Status: DC | PRN
  Administered 2022-06-23: 2 via RESPIRATORY_TRACT
  Filled 2022-06-23: qty 6.7

## 2022-06-23 MED ORDER — BENZONATATE 100 MG PO CAPS: 100.0000 mg | ORAL_CAPSULE | Freq: Three times a day (TID) | ORAL | 0 refills | Status: DC

## 2022-06-23 NOTE — ED Triage Notes (Signed)
Patient here POV from Home.  Endorses Cough and Congestion for approximately 2 Weeks ago. Diagnosed with COVID-19 today with PCP and sent for Evaluation due to Duration of Symptoms.   No fevers. Sent Azithromycin but Pharmacist recommended to speak with PCP again by Pharmacist due to taking Warfarin.   NAD Noted during Triage. A&Ox4. GCS 15. BIB Personal Wheelchair.

## 2022-06-23 NOTE — ED Notes (Signed)
Patient educated on using Albuterol inhaler.  Patient choose to not use a spacer with the inhaler.

## 2022-06-23 NOTE — Discharge Instructions (Signed)
Please read and follow all provided instructions.  Your diagnoses today include:  1. COVID-19   2. Acute cough     Tests performed today include: Vital signs. See below for your results today.  Chest x-ray: no sign of pneumonia Complete blood cell count:  Basic metabolic panel:  INR: was 2.2  Medications prescribed:  Tessalon Perles - cough suppressant medication  Albuterol inhaler - medication that opens up your airway  Use inhaler as follows: 1-2 puffs with spacer every 4 hours as needed for wheezing, cough, or shortness of breath.   Take any prescribed medications only as directed. Treatment for your infection is aimed at treating the symptoms. There are no medications, such as antibiotics, that will cure your infection.   Home care instructions:  Follow any educational materials contained in this packet.   Your illness is contagious and can be spread to others, especially during the first 3 or 4 days. It cannot be cured by antibiotics or other medicines. Take basic precautions such as washing your hands often, covering your mouth when you cough or sneeze, and avoiding public places where you could spread your illness to others.   Please continue drinking plenty of fluids.  Use over-the-counter medicines as needed as directed on packaging for symptom relief.  You may also use ibuprofen or tylenol as directed on packaging for pain or fever.  Do not take multiple medicines containing Tylenol or acetaminophen to avoid taking too much of this medication.  If you are positive for Covid-19, you should isolate yourself and not be exposed to other people for 5 days after your symptoms began. If you are not feeling better at day 5, you need to isolate yourself for a total of 10 days. If you are feeling better by day 5, you should wear a mask properly, over your nose and mouth, at all times while around other people until 10 days after your symptoms started.   Follow-up instructions: Please  follow-up with your primary care provider as needed for further evaluation of your symptoms if you are not feeling better.   Return instructions:  Please return to the Emergency Department if you experience worsening symptoms.  Return to the emergency department if you have worsening shortness of breath breathing or increased work of breathing, persistent vomiting RETURN IMMEDIATELY IF you develop shortness of breath, confusion or altered mental status, a new rash, become dizzy, faint, or poorly responsive, or are unable to be cared for at home. Please return if you have persistent vomiting and cannot keep down fluids or develop a fever that is not controlled by tylenol or motrin.   Please return if you have any other emergent concerns.  Additional Information:  Your vital signs today were: BP (!) 122/56 (BP Location: Right Arm)   Pulse 90   Temp 99.2 F (37.3 C) (Oral)   Resp 18   Ht 5' 4"$  (1.626 m)   Wt (!) 163.3 kg   SpO2 98%   BMI 61.80 kg/m  If your blood pressure (BP) was elevated above 135/85 this visit, please have this repeated by your doctor within one month. --------------

## 2022-06-23 NOTE — ED Provider Notes (Signed)
[facility] Provider Note   CSN: [id number] Arrival date & time: 06/23/22  1201     History  Chief Complaint  Patient presents with   Cough    Natasha Rollins is a 62 y.o. female.  Patient with history of pulmonary embolism on Coumadin, history of diabetes, high cholesterol, hypertension --presents to the emergency department for evaluation of ongoing cough.  Patient has had symptoms for about a week and a half.  She followed up with her doctor 2 days ago and had testing done.  She tested positive for COVID and found out today.  She states that 2 days ago she was given a prescription for azithromycin due to "congestion", however she was dissuaded by her pharmacist to have this filled due to interaction with Coumadin.  Patient states compliance with her anticoagulation.  She denies significant shortness of breath.  No fevers.  She has had associated nasal congestion.  She did state that she thought she was starting to get better but then developed worsening cough and congestion and tightness in the upper chest.  No vomiting or diarrhea.       Home Medications Prior to Admission medications   Medication Sig Start Date End Date Taking? Authorizing Provider  allopurinol (ZYLOPRIM) 100 MG tablet Take 1 tablet (100 mg total) by mouth daily. 09/05/19   Trula Slade, DPM  allopurinol (ZYLOPRIM) 100 MG tablet     [provider]  amLODipine (NORVASC) 10 MG tablet Take 10 mg by mouth daily. 06/07/14   [provider]  amLODipine (NORVASC) 10 MG tablet Take 1 tablet by mouth daily.    [provider]  atorvastatin (LIPITOR) 10 MG tablet TAKE 1 TABLET 3 DAYS A WEEK 03/12/19   [provider]  B-D UF III MINI PEN NEEDLES 31G X 5 MM MISC USE AS DIRECTED 5 TIMES A DAY 12/20/18   [provider]  bisacodyl (DULCOLAX) 5 MG EC tablet Take 4 tablets at 3:00 pm on 04/05/22 03/23/22   Sharyn Creamer, MD  Blood  Glucose Monitoring Suppl (FREESTYLE FREEDOM LITE) w/Device KIT See admin instructions. 07/03/19   [provider]  clindamycin (CLEOCIN T) 1 % lotion Apply topically daily. to face 12/22/18   [provider]  clotrimazole-betamethasone Donalynn Furlong) cream  11/08/18   [provider]  colchicine 0.6 MG tablet Take 1 tablet (0.6 mg total) by mouth daily. 07/04/19   Trula Slade, DPM  folic acid (FOLVITE) 1 MG tablet Take 1 mg by mouth daily.    [provider]  gabapentin (NEURONTIN) 100 MG capsule     [provider]  gabapentin (NEURONTIN) 300 MG capsule Take 1 capsule (300 mg total) by mouth at bedtime. 10/27/19   Trula Slade, DPM  HYDROcodone-acetaminophen (NORCO/VICODIN) 5-325 MG tablet  06/29/19   [provider]  hydrocortisone 2.5 % lotion  12/23/18   [provider]  JANUVIA 100 MG tablet Take 100 mg by mouth daily. 02/24/18   [provider]  Lancets (ONETOUCH DELICA PLUS Q000111Q) [place] USE AS DIRECTED ONCE A DAY. 06/17/18   [provider]  LEVEMIR FLEXTOUCH 100 UNIT/ML Pen Inject 75 Units into the skin 2 (two) times daily.  07/28/14   [provider]  losartan-hydrochlorothiazide (HYZAAR) 100-25 MG tablet Take 1 tablet by mouth daily. 12/20/17   [provider]  metroNIDAZOLE (METROGEL) 1 % gel  02/05/18   [provider]  mupirocin ointment (BACTROBAN) 2 %  Apply topically. 02/19/18   [provider]  NOVOLOG FLEXPEN 100 UNIT/ML FlexPen Inject 30 Units into the skin 3 (three) times daily with meals.  07/28/14   [provider]  ONETOUCH VERIO test strip  11/26/18   [provider]  polyethylene glycol (COLYTE WITH FLAVOR PACKS) 240 g solution Take as a split dose following doctor instructions 03/23/22   Sharyn Creamer, MD  pregabalin (LYRICA) 50 MG capsule pregabalin 50 mg capsule  TAKE 1 CAPSULE BY MOUTH TWICE A DAY    [provider]   rosuvastatin (CRESTOR) 10 MG tablet rosuvastatin 10 mg tablet  TAKE 1 TABLET BY MOUTH FOR 3 DAYS A WEEK 10/13/20   [provider]  traMADol Veatrice Bourbon) 50 MG tablet  04/18/19   [provider]  warfarin (COUMADIN) 5 MG tablet Take 1 tablet (5 mg total) by mouth daily at 6 PM. 09/11/16 01/06/22  Wendie Agreste, MD      Allergies    Enoxaparin, Metformin hcl, Tramadol, Penicillins, and Saxagliptin    Review of Systems   Review of Systems  Physical Exam Updated Vital Signs BP (!) 122/56 (BP Location: Right Arm)   Pulse 90   Temp 99.2 F (37.3 C) (Oral)   Resp 18   Ht 5' 4"$  (1.626 m)   Wt (!) 163.3 kg   SpO2 98%   BMI 61.80 kg/m  Physical Exam Vitals and nursing note reviewed.  Constitutional:      General: She is not in acute distress.    Appearance: She is well-developed.  HENT:     Head: Normocephalic and atraumatic.     Right Ear: External ear normal.     Left Ear: External ear normal.     Nose: Nose normal.     Mouth/Throat:     Mouth: Mucous membranes are moist.  Eyes:     Conjunctiva/sclera: Conjunctivae normal.  Cardiovascular:     Rate and Rhythm: Normal rate and regular rhythm.     Heart sounds: No murmur heard.    Comments: No tachycardia Pulmonary:     Effort: No respiratory distress.     Breath sounds: Rhonchi (Mild scattered) present. No wheezing or rales.     Comments: No respiratory distress, speaking in full sentences. Abdominal:     Palpations: Abdomen is soft.     Tenderness: There is no abdominal tenderness. There is no guarding or rebound.  Musculoskeletal:     Cervical back: Normal range of motion and neck supple.     Right lower leg: No edema.     Left lower leg: No edema.  Skin:    General: Skin is warm and dry.     Findings: No rash.  Neurological:     General: No focal deficit present.     Mental Status: She is alert. Mental status is at baseline.     Motor: No weakness.  Psychiatric:        Mood and Affect: Mood  normal.     ED Results / Procedures / Treatments   Labs (all labs ordered are listed, but only abnormal results are displayed) Labs Reviewed  CBC WITH DIFFERENTIAL/PLATELET  PROTIME-INR  BASIC METABOLIC PANEL    EKG None  Radiology No results found.  Procedures Procedures    Medications Ordered in ED Medications - No data to display  ED Course/ Medical Decision Making/ A&P    Patient seen and examined. History obtained directly from patient.  She states that she was  sent in from her doctor for further evaluation.  Labs/EKG: Ordered CBC, BMP, INR.  Imaging: Ordered chest x-ray.  Medications/Fluids: None ordered  Most recent vital signs reviewed and are as follows: BP (!) 122/56 (BP Location: Right Arm)   Pulse 90   Temp 99.2 F (37.3 C) (Oral)   Resp 18   Ht 5' 4"$  (1.626 m)   Wt (!) 163.3 kg   SpO2 98%   BMI 61.80 kg/m   Initial impression: COVID infection, cough, rule out pneumonia.  Clinically I have low concern for PE given symptoms are most consistent with respiratory infection.  If her INR is therapeutic, do not feel that she needs further workup or evaluation for PE at this time given her normal vital signs.  2:15 PM Reassessment performed. Patient appears stable, comfortable.  Labs personally reviewed and interpreted including: CBC with mild anemia at 10.5 close to baseline no other concerns; BMP unremarkable; PT and INR therapeutic at 2.2.  Imaging personally visualized and interpreted including: Chest x-ray, agree negative for pneumonia.  Reviewed pertinent lab work and imaging with patient at bedside. Questions answered.   Most current vital signs reviewed and are as follows: BP (!) 122/56 (BP Location: Right Arm)   Pulse 90   Temp 99.2 F (37.3 C) (Oral)   Resp 18   Ht 5' 4"$  (1.626 m)   Wt (!) 163.3 kg   SpO2 98%   BMI 61.80 kg/m   Plan: Discharge to home.   Prescriptions written for: Tessalon, provided with albuterol inhaler  Other  home care instructions discussed: Rest, hydration  Detailed discussion had with with patient regarding COVID-19 precautions and written instructions given as well.  We discussed need to isolate themselves for 5 days from onset of symptoms and have 24 hours of improvement prior to breaking isolation.  We discussed that when breaking isolation, mask wearing for 5 additional days is required.  We discussed signs symptoms to return which include worsening shortness of breath, trouble breathing, or increased work of breathing.  Also return with persistent vomiting, confusion, passing out, or if they have any other concerns. Counseled on the need for rest and good hydration. Discussed that high-risk contacts should be aware of positive result and they need to quarantine and be tested if they develop any symptoms. Patient verbalizes understanding.   Lizzy Shepheard was evaluated in Emergency Department on 06/23/2022 for the symptoms described in the history of present illness. She was evaluated in the context of the global COVID-19 pandemic, which necessitated consideration that the patient might be at risk for infection with the SARS-CoV-2 virus that causes COVID-19. Institutional protocols and algorithms that pertain to the evaluation of patients at risk for COVID-19 are in a state of rapid change based on information released by regulatory bodies including the CDC and federal and state organizations. These policies and algorithms were followed during the patient's care in the ED.                             Medical Decision Making Amount and/or Complexity of Data Reviewed Labs: ordered. Radiology: ordered.   Patient with ongoing cough, suspected COVID after recent positive testing.  Patient's vital signs are reassuring without hypoxia.  Chest x-ray does not demonstrate pneumonia.  She does have a history of PE, however no severe chest pain, hypoxia, tachycardia, or other clinical signs of DVT.  She is  compliant with her medication and therapeutic on  warfarin.  I have low concern for DVT today.  Do not suspect ACS.  Do not suspect sepsis.  The patient's vital signs, pertinent lab work and imaging were reviewed and interpreted as discussed in the ED course. Hospitalization was considered for further testing, treatments, or serial exams/observation. However as patient is well-appearing, has a stable exam, and reassuring studies today, I do not feel that they warrant admission at this time. This plan was discussed with the patient who verbalizes agreement and comfort with this plan and seems reliable and able to return to the Emergency Department with worsening or changing symptoms.          Final Clinical Impression(s) / ED Diagnoses Final diagnoses:  COVID-19  Acute cough    Rx / DC Orders ED Discharge Orders          Ordered    benzonatate (TESSALON) 100 MG capsule  Every 8 hours        06/23/22 1411              Carlisle Cater, PA-C 06/23/22 1418    Lacretia Leigh, MD 06/26/22 0945

## 2022-07-04 ENCOUNTER — Emergency Department (HOSPITAL_BASED_OUTPATIENT_CLINIC_OR_DEPARTMENT_OTHER): Payer: BC Managed Care – PPO | Admitting: Radiology

## 2022-07-04 ENCOUNTER — Encounter (HOSPITAL_BASED_OUTPATIENT_CLINIC_OR_DEPARTMENT_OTHER): Payer: Self-pay

## 2022-07-04 ENCOUNTER — Other Ambulatory Visit (HOSPITAL_BASED_OUTPATIENT_CLINIC_OR_DEPARTMENT_OTHER): Payer: Self-pay

## 2022-07-04 ENCOUNTER — Emergency Department (HOSPITAL_BASED_OUTPATIENT_CLINIC_OR_DEPARTMENT_OTHER): Payer: BC Managed Care – PPO

## 2022-07-04 ENCOUNTER — Other Ambulatory Visit: Payer: Self-pay

## 2022-07-04 ENCOUNTER — Emergency Department (HOSPITAL_BASED_OUTPATIENT_CLINIC_OR_DEPARTMENT_OTHER)
Admission: EM | Admit: 2022-07-04 | Discharge: 2022-07-04 | Disposition: A | Discharge status: Home / Self Care | Payer: BC Managed Care – PPO | Attending: Emergency Medicine | Admitting: Emergency Medicine

## 2022-07-04 DIAGNOSIS — D649 Anemia, unspecified: Secondary | ICD-10-CM | POA: Diagnosis not present

## 2022-07-04 DIAGNOSIS — Z7901 Long term (current) use of anticoagulants: Secondary | ICD-10-CM | POA: Insufficient documentation

## 2022-07-04 DIAGNOSIS — I10 Essential (primary) hypertension: Secondary | ICD-10-CM | POA: Insufficient documentation

## 2022-07-04 DIAGNOSIS — E1165 Type 2 diabetes mellitus with hyperglycemia: Secondary | ICD-10-CM | POA: Diagnosis not present

## 2022-07-04 DIAGNOSIS — Z79899 Other long term (current) drug therapy: Secondary | ICD-10-CM | POA: Diagnosis not present

## 2022-07-04 DIAGNOSIS — R059 Cough, unspecified: Secondary | ICD-10-CM | POA: Diagnosis present

## 2022-07-04 DIAGNOSIS — D72829 Elevated white blood cell count, unspecified: Secondary | ICD-10-CM | POA: Insufficient documentation

## 2022-07-04 DIAGNOSIS — J189 Pneumonia, unspecified organism: Secondary | ICD-10-CM

## 2022-07-04 DIAGNOSIS — Z794 Long term (current) use of insulin: Secondary | ICD-10-CM | POA: Diagnosis not present

## 2022-07-04 LAB — CBC WITH DIFFERENTIAL/PLATELET
Abs Immature Granulocytes: 0.08 10*3/uL — ABNORMAL HIGH (ref 0.00–0.07)
Basophils Absolute: 0.1 10*3/uL (ref 0.0–0.1)
Basophils Relative: 0 %
Eosinophils Absolute: 0.1 10*3/uL (ref 0.0–0.5)
Eosinophils Relative: 1 %
HCT: 34.1 % — ABNORMAL LOW (ref 36.0–46.0)
Hemoglobin: 10.3 g/dL — ABNORMAL LOW (ref 12.0–15.0)
Immature Granulocytes: 1 %
Lymphocytes Relative: 12 %
Lymphs Abs: 1.5 10*3/uL (ref 0.7–4.0)
MCH: 25.3 pg — ABNORMAL LOW (ref 26.0–34.0)
MCHC: 30.2 g/dL (ref 30.0–36.0)
MCV: 83.8 fL (ref 80.0–100.0)
Monocytes Absolute: 0.7 10*3/uL (ref 0.1–1.0)
Monocytes Relative: 5 %
Neutro Abs: 10 10*3/uL — ABNORMAL HIGH (ref 1.7–7.7)
Neutrophils Relative %: 81 %
Platelets: 410 10*3/uL — ABNORMAL HIGH (ref 150–400)
RBC: 4.07 MIL/uL (ref 3.87–5.11)
RDW: 16 % — ABNORMAL HIGH (ref 11.5–15.5)
WBC: 12.4 10*3/uL — ABNORMAL HIGH (ref 4.0–10.5)
nRBC: 0 % (ref 0.0–0.2)

## 2022-07-04 LAB — COMPREHENSIVE METABOLIC PANEL
ALT: 13 U/L (ref 0–44)
AST: 13 U/L — ABNORMAL LOW (ref 15–41)
Albumin: 3.8 g/dL (ref 3.5–5.0)
Alkaline Phosphatase: 63 U/L (ref 38–126)
Anion gap: 6 (ref 5–15)
BUN: 22 mg/dL (ref 8–23)
CO2: 31 mmol/L (ref 22–32)
Calcium: 10 mg/dL (ref 8.9–10.3)
Chloride: 101 mmol/L (ref 98–111)
Creatinine, Ser: 1.01 mg/dL — ABNORMAL HIGH (ref 0.44–1.00)
GFR, Estimated: >60 mL/min (ref >60)
Glucose, Bld: 164 mg/dL — ABNORMAL HIGH (ref 70–99)
Potassium: 3.9 mmol/L (ref 3.5–5.1)
Sodium: 138 mmol/L (ref 135–145)
Total Bilirubin: 0.4 mg/dL (ref 0.3–1.2)
Total Protein: 7.8 g/dL (ref 6.5–8.1)

## 2022-07-04 LAB — PROTIME-INR
INR: 1.9 — ABNORMAL HIGH (ref 0.8–1.2)
Prothrombin Time: 22 seconds — ABNORMAL HIGH (ref 11.4–15.2)

## 2022-07-04 MED ORDER — AZITHROMYCIN 250 MG PO TABS
250.0000 mg | ORAL_TABLET | Freq: Every day | ORAL | 0 refills | Status: DC
  Filled 2022-07-04: qty 6, 5d supply, fill #0

## 2022-07-04 MED ORDER — IOHEXOL 350 MG/ML SOLN
100.0000 mL | Freq: Once | INTRAVENOUS | Status: AC | PRN
  Administered 2022-07-04: 100 mL via INTRAVENOUS

## 2022-07-04 NOTE — ED Provider Notes (Signed)
[facility] Provider Note   CSN: [id number] Arrival date & time: 07/04/22  1126     History  Chief Complaint  Patient presents with   Cough    Natasha Rollins is a 62 y.o. female.   Cough    62 year old female with medical history significant for PE on warfarin, hypertension, DM 2, morbid obesity, recent COVID-19 infection 2 weeks ago who presents to the emergency department at the urging of her PCP due to concern for possible pneumonia.  The patient states that since her COVID infection, she has had a persistent dry cough with associated pleuritic chest discomfort.  She states that it feels similar to her prior PEs.  She has been compliant with her Coumadin and her last INR was checked last Thursday and it was therapeutic at greater than 2.  Dors is mild shortness of breath.  She denies any productivity to her cough.  She denies any fevers or chills.  Home Medications Prior to Admission medications   Medication Sig Start Date End Date Taking? Authorizing Provider  azithromycin (ZITHROMAX) 250 MG tablet Take 1 tablet (250 mg total) by mouth daily. Take first 2 tablets together, then 1 every day until finished. 07/04/22  Yes Regan Lemming, MD  allopurinol (ZYLOPRIM) 100 MG tablet Take 1 tablet (100 mg total) by mouth daily. 09/05/19   Trula Slade, DPM  allopurinol (ZYLOPRIM) 100 MG tablet     [provider]  amLODipine (NORVASC) 10 MG tablet Take 10 mg by mouth daily. 06/07/14   [provider]  amLODipine (NORVASC) 10 MG tablet Take 1 tablet by mouth daily.    [provider]  atorvastatin (LIPITOR) 10 MG tablet TAKE 1 TABLET 3 DAYS A WEEK 03/12/19   [provider]  B-D UF III MINI PEN NEEDLES 31G X 5 MM MISC USE AS DIRECTED 5 TIMES A DAY 12/20/18   [provider]  benzonatate (TESSALON) 100 MG capsule Take 1 capsule (100 mg total) by mouth every 8 (eight) hours. 06/23/22   Carlisle Cater, PA-C  bisacodyl (DULCOLAX) 5 MG EC tablet Take 4 tablets at 3:00 pm on 04/05/22 03/23/22   Sharyn Creamer, MD  Blood Glucose Monitoring Suppl (FREESTYLE FREEDOM LITE) w/Device KIT See admin instructions. 07/03/19   [provider]  clindamycin (CLEOCIN T) 1 % lotion Apply topically daily. to face 12/22/18   [provider]  clotrimazole-betamethasone Donalynn Furlong) cream  11/08/18   [provider]  colchicine 0.6 MG tablet Take 1 tablet (0.6 mg total) by mouth daily. 07/04/19   Trula Slade, DPM  folic acid (FOLVITE) 1 MG tablet Take 1 mg by mouth daily.    [provider]  gabapentin (NEURONTIN) 100 MG capsule     [provider]  gabapentin (NEURONTIN) 300 MG capsule Take 1 capsule (300 mg total) by mouth at bedtime. 10/27/19   Trula Slade, DPM  HYDROcodone-acetaminophen (NORCO/VICODIN) 5-325 MG tablet  06/29/19   [provider]  hydrocortisone 2.5 % lotion  12/23/18   [provider]  JANUVIA 100 MG tablet Take 100 mg by mouth daily. 02/24/18   [provider]  Lancets (ONETOUCH DELICA PLUS Q000111Q) [place] USE AS DIRECTED ONCE A DAY. 06/17/18   [provider]  LEVEMIR FLEXTOUCH 100 UNIT/ML Pen Inject 75 Units into the skin 2 (two) times daily.  07/28/14   [provider]  losartan-hydrochlorothiazide (HYZAAR) 100-25 MG tablet Take 1 tablet by mouth daily.  12/20/17   [provider]  metroNIDAZOLE (METROGEL) 1 % gel  02/05/18   [provider]  mupirocin ointment (BACTROBAN) 2 % Apply topically. 02/19/18   [provider]  NOVOLOG FLEXPEN 100 UNIT/ML FlexPen Inject 30 Units into the skin 3 (three) times daily with meals.  07/28/14   [provider]  ONETOUCH VERIO test strip  11/26/18   [provider]  OZEMPIC, 1 MG/DOSE, 4 MG/3ML SOPN Inject 1 mg into the skin once a week. 05/05/22   [provider]  polyethylene glycol (COLYTE WITH FLAVOR  PACKS) 240 g solution Take as a split dose following doctor instructions 03/23/22   Sharyn Creamer, MD  pregabalin (LYRICA) 50 MG capsule pregabalin 50 mg capsule  TAKE 1 CAPSULE BY MOUTH TWICE A DAY    [provider]  rosuvastatin (CRESTOR) 10 MG tablet rosuvastatin 10 mg tablet  TAKE 1 TABLET BY MOUTH FOR 3 DAYS A WEEK 10/13/20   [provider]  traMADol Veatrice Bourbon) 50 MG tablet  04/18/19   [provider]  Vitamin D, Ergocalciferol, (DRISDOL) 1.25 MG (50000 UNIT) CAPS capsule Take 50,000 Units by mouth once a week. 03/22/22   [provider]  warfarin (COUMADIN) 5 MG tablet Take 1 tablet (5 mg total) by mouth daily at 6 PM. 09/11/16 01/06/22  Wendie Agreste, MD      Allergies    Enoxaparin, Metformin hcl, Tramadol, Penicillins, and Saxagliptin    Review of Systems   Review of Systems  Respiratory:  Positive for cough.     Physical Exam Updated Vital Signs BP (!) 146/86 (BP Location: Right Arm)   Pulse 92   Temp 98.5 F (36.9 C) (Oral)   Resp 18   Ht '5\' 4"'$  (1.626 m)   Wt (!) 163.3 kg   SpO2 98%   BMI 61.80 kg/m  Physical Exam Vitals and nursing note reviewed.  Constitutional:      General: She is not in acute distress.    Appearance: She is well-developed.  HENT:     Head: Normocephalic and atraumatic.  Eyes:     Conjunctiva/sclera: Conjunctivae normal.  Cardiovascular:     Rate and Rhythm: Normal rate and regular rhythm.     Pulses: Normal pulses.  Pulmonary:     Effort: Pulmonary effort is normal. No respiratory distress.     Breath sounds: Normal breath sounds.  Abdominal:     Palpations: Abdomen is soft.     Tenderness: There is no abdominal tenderness.  Musculoskeletal:        General: No swelling.     Cervical back: Neck supple.  Skin:    General: Skin is warm and dry.     Capillary Refill: Capillary refill takes less than 2 seconds.  Neurological:     Mental Status: She is alert.  Psychiatric:        Mood and Affect:  Mood normal.     ED Results / Procedures / Treatments   Labs (all labs ordered are listed, but only abnormal results are displayed) Labs Reviewed  CBC WITH DIFFERENTIAL/PLATELET - Abnormal; Notable for the following components:      Result Value   WBC 12.4 (*)    Hemoglobin 10.3 (*)    HCT 34.1 (*)    MCH 25.3 (*)    RDW 16.0 (*)    Platelets 410 (*)    Neutro Abs 10.0 (*)    Abs Immature Granulocytes 0.08 (*)    All  other components within normal limits  COMPREHENSIVE METABOLIC PANEL - Abnormal; Notable for the following components:   Glucose, Bld 164 (*)    Creatinine, Ser 1.01 (*)    AST 13 (*)    All other components within normal limits  PROTIME-INR - Abnormal; Notable for the following components:   Prothrombin Time 22.0 (*)    INR 1.9 (*)    All other components within normal limits    EKG None  Radiology CT Angio Chest PE W and/or Wo Contrast  Result Date: 07/04/2022 CLINICAL DATA:  Cough, congestion, COVID-19 infection. History of pulmonary embolus shown on chest CT of 06/20/2016. EXAM: CT ANGIOGRAPHY CHEST WITH CONTRAST TECHNIQUE: Multidetector CT imaging of the chest was performed using the standard protocol during bolus administration of intravenous contrast. Multiplanar CT image reconstructions and MIPs were obtained to evaluate the vascular anatomy. RADIATION DOSE REDUCTION: This exam was performed according to the departmental dose-optimization program which includes automated exposure control, adjustment of the mA and/or kV according to patient size and/or use of iterative reconstruction technique. CONTRAST:  123m OMNIPAQUE IOHEXOL 350 MG/ML SOLN COMPARISON:  Chest radiograph 07/04/2022 FINDINGS: Body habitus and suboptimal contrast timing reduce diagnostic sensitivity and specificity. I am not confident that a repeat scan and repeat contrast injection would produce substantially higher confidence images. Cardiovascular: No filling defect is identified in the  pulmonary arterial tree to suggest pulmonary embolus. Reduced sensitivity due to body habitus and technical factors. Left anterior descending coronary artery atherosclerosis. Mild cardiomegaly. Mediastinum/Nodes: Prominent right thyroid lobe, previously characterized with fine-needle aspiration on 08/04/2021. This has been evaluated on previous imaging. (ref: J Am Coll Radiol. 2015 Feb;12(2): 143-50). Lungs/Pleura: Subtle central ground-glass opacity in the left upper lobe for example on image 30 of series 6, possibly a manifestation of atypical infection. The lungs appear otherwise clear. Upper Abdomen: 1.7 cm benign myelolipoma of the left adrenal gland. A second small benign myelolipoma of the left adrenal gland is noted. No further imaging workup of these findings is indicated. Musculoskeletal: Thoracic spondylosis. Review of the MIP images confirms the above findings. IMPRESSION: 1. No filling defect is identified in the pulmonary arterial tree to suggest pulmonary embolus. Reduced sensitivity due to body habitus and suboptimal contrast timing. 2. Subtle central ground-glass opacity in the left upper lobe, possibly a manifestation of atypical infection. The lungs appear otherwise clear. 3. Coronary artery atherosclerosis. Mild cardiomegaly. 4. Prominent right thyroid lobe, previously characterized with fine-needle aspiration on 08/04/2021. 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 5. Thoracic spondylosis. 6. Benign myelolipomas of the left adrenal gland. Electronically Signed   By: WVan ClinesM.D.   On: 07/04/2022 15:42   DG Chest 2 View  Result Date: 07/04/2022 CLINICAL DATA:  Cough EXAM: CHEST - 2 VIEW COMPARISON:  06/23/2022 x-ray FINDINGS: Film is rotated to the left. No consolidation, pneumothorax or effusion. No edema. Calcified aorta. Normal cardiopericardial silhouette. IMPRESSION: Rotated radiograph.  No acute cardiopulmonary disease Electronically  Signed   By: AJill SideM.D.   On: 07/04/2022 11:56    Procedures Procedures    Medications Ordered in ED Medications  iohexol (OMNIPAQUE) 350 MG/ML injection 100 mL (100 mLs Intravenous Contrast Given 07/04/22 1523)    ED Course/ Medical Decision Making/ A&P Clinical Course as of 07/04/22 2119  Tue Jul 04, 2022  1501 INR(!): 1.9 [JL]    Clinical Course User Index [JL] LRegan Lemming MD  Medical Decision Making Amount and/or Complexity of Data Reviewed Labs: ordered. Decision-making details documented in ED Course. Radiology: ordered.  Risk Prescription drug management.    62 year old female with medical history significant for PE on warfarin, hypertension, DM 2, morbid obesity, recent COVID-19 infection 2 weeks ago who presents to the emergency department at the urging of her PCP due to concern for possible pneumonia.  The patient states that since her COVID infection, she has had a persistent dry cough with associated pleuritic chest discomfort.  She states that it feels similar to her prior PEs.  She has been compliant with her Coumadin and her last INR was checked last Thursday and it was therapeutic at greater than 2.  Dors is mild shortness of breath.  She denies any productivity to her cough.  She denies any fevers or chills.  On arrival, the patient was afebrile, not tachycardic or tachypneic, normotensive, saturating 100% on room air.  Send physical exam generally unremarkable.  Lungs clear to auscultation bilaterally.  Differential diagnosis includes viral URI, COVID-19, influenza, bacterial pneumonia, pneumothorax, PE.  Laboratory evaluation significant for CBC with a leukocytosis to 12.4, mild anemia to 10.3, C MP with hyperglycemia to 164, otherwise unremarkable, INR mildly subtherapeutic at 1.9 with a goal between 2 and 3.  Initial chest x-ray was performed which was unremarkable.  A CTA PE study was performed given the patient's cough  and results are as follows: IMPRESSION:  1. No filling defect is identified in the pulmonary arterial tree to  suggest pulmonary embolus. Reduced sensitivity due to body habitus  and suboptimal contrast timing.  2. Subtle central ground-glass opacity in the left upper lobe,  possibly a manifestation of atypical infection. The lungs appear  otherwise clear.  3. Coronary artery atherosclerosis. Mild cardiomegaly.  4. Prominent right thyroid lobe, previously characterized with  fine-needle aspiration on 08/04/2021. One of the biopsy lesions at  that time was a Hurthle cell lesion and/or neoplasm.  5. Thoracic spondylosis.  6. Benign myelolipomas of the left adrenal gland.    No clear evidence of pulmonary embolism and the patient is anticoagulated although her INR was mildly subtherapeutic.  I advised the patient continue to take her Coumadin for and follow-up outpatient for recheck.  The patient was found to have groundglass opacity in the left upper lobe which could be due to an atypical pneumonia which is consistent with the patient's symptoms and leukocytosis.  She denies any chest pain at this time.  No further workup is indicated.  Will discharge the patient on a course of antibiotics and have the patient follow-up outpatient for recheck of her INR and to ensure symptomatic resolution.  Overall stable on repeat assessment, stable for discharge.  DC Instructions: Your CT scan was negative for blood clot but positive for evidence of potential developing pneumonia and have an elevated white blood cell count.  Will treat with a course of antibiotics.  INR was mildly subtherapeutic however we are starting you on an antibiotic that sometimes can increase you INR.  Recommend you follow-up outpatient within a few days for recheck of your INR.  Final Clinical Impression(s) / ED Diagnoses Final diagnoses:  Atypical pneumonia    Rx / DC Orders ED Discharge Orders          Ordered     azithromycin (ZITHROMAX) 250 MG tablet  Daily        07/04/22 1641              Simra Fiebig,  Jeneen Rinks, MD 07/04/22 2120

## 2022-07-04 NOTE — ED Triage Notes (Signed)
Patient here POV from Home.  Endorses having COVID-19 recently after having experienced Symptoms such as Cough, Congestion. Seen yesterday by PCP and had an Assessment who recommended ED Evaluation for CXR imaging.   Mostly Dry Cough. No fevers.   NAD Noted during Triage. A&Ox4. GCS 15. Ambulatory.

## 2022-07-04 NOTE — ED Notes (Signed)
CT called. Should be going soon.

## 2022-07-04 NOTE — Discharge Instructions (Addendum)
Your CT scan was negative for blood clot but positive for evidence of potential developing pneumonia and have an elevated white blood cell count.  Will treat with a course of antibiotics.  INR was mildly subtherapeutic however we are starting you on an antibiotic that sometimes can increase you INR.  Recommend you follow-up outpatient within a few days for recheck of your INR.

## 2022-07-04 NOTE — ED Notes (Signed)
Patient transported to CT 

## 2022-07-07 ENCOUNTER — Ambulatory Visit: Payer: BC Managed Care – PPO | Admitting: Physical Therapy

## 2022-07-18 ENCOUNTER — Ambulatory Visit: Payer: BC Managed Care – PPO

## 2022-07-24 ENCOUNTER — Ambulatory Visit: Payer: BC Managed Care – PPO | Admitting: Podiatry

## 2022-07-24 DIAGNOSIS — S96911S Strain of unspecified muscle and tendon at ankle and foot level, right foot, sequela: Secondary | ICD-10-CM

## 2022-07-24 DIAGNOSIS — M7751 Other enthesopathy of right foot: Secondary | ICD-10-CM

## 2022-07-24 NOTE — Progress Notes (Signed)
Subjective: Chief Complaint  Patient presents with   Follow-up    Patient bilateral feet, patient stated she is doing much better     62 year old female presents the office today for follow evaluation of bilateral foot discomfort.  She states that she was not able to get the injection her back due to being sick and she is scheduled next week for this.  Otherwise she thinks this.  Been doing better actually.  No recent injury or falls or changes otherwise.  She also states that when she has injection in her back it does make the whole leg feel better.  Objective: AAO x3, NAD DP/PT pulses palpable bilaterally, CRT less than 3 seconds There is discomfort along the sinus tarsi bilaterally although does seem to be improved on the right side.  No area pinpoint tenderness noted today.  There is no pain along the left fourth toe.  There is no other areas of discomfort noted today. No pain with calf compression, swelling, warmth, erythema  Assessment: 62 year old female left fourth toe fracture, capsulitis right foot  Plan: -All treatment options discussed with the patient including all alternatives, risks, complications.  To be held off another injection she is coming up to have an injection in her back.  Her feet been doing better I do think a lot of her symptoms are nerve related coming from her back at the symptoms to her feet and legs improved after that the injection.  -Continue therapy, home exercises. -WBAT   No follow-ups on file.  Trula Slade DPM

## 2022-08-01 ENCOUNTER — Ambulatory Visit: Payer: BC Managed Care – PPO | Attending: Podiatry

## 2022-08-01 DIAGNOSIS — G8929 Other chronic pain: Secondary | ICD-10-CM | POA: Diagnosis present

## 2022-08-01 DIAGNOSIS — M25562 Pain in left knee: Secondary | ICD-10-CM | POA: Insufficient documentation

## 2022-08-01 DIAGNOSIS — M6281 Muscle weakness (generalized): Secondary | ICD-10-CM | POA: Insufficient documentation

## 2022-08-01 DIAGNOSIS — R293 Abnormal posture: Secondary | ICD-10-CM | POA: Insufficient documentation

## 2022-08-01 DIAGNOSIS — M25561 Pain in right knee: Secondary | ICD-10-CM | POA: Insufficient documentation

## 2022-08-01 DIAGNOSIS — R252 Cramp and spasm: Secondary | ICD-10-CM | POA: Insufficient documentation

## 2022-08-01 DIAGNOSIS — R262 Difficulty in walking, not elsewhere classified: Secondary | ICD-10-CM | POA: Insufficient documentation

## 2022-08-01 NOTE — Therapy (Unsigned)
OUTPATIENT PHYSICAL THERAPY REASSESSMENT/ RNOTE   Patient Name: Natasha Rollins MRN: [id number] DOB:Sep 29, 1960, 62 y.o., female Today's Date: 08/01/2022.     PCP: Jonathon Jordan, MD  REFERRING PROVIDER: Trula Slade, DPM   END OF SESSION:   PT End of Session - 08/01/22 1616     Visit Number 13    Date for PT Re-Evaluation 09/26/22    Authorization Type BCBS    PT Start Time 1615    PT Stop Time 1700    PT Time Calculation (min) 45 min    Activity Tolerance Patient limited by fatigue;Patient limited by pain    Behavior During Therapy [facility] for tasks assessed/performed                  Past Medical History:  Diagnosis Date   Diabetes mellitus without complication ([place])    Fat necrosis 05/28/2018   Hidradenitis suppurativa 05/28/2018   Hypertension    Morbid obesity ([place])    Right second toe ulcer ([place]) 05/28/2018   Past Surgical History:  Procedure Laterality Date   CESAREAN SECTION     x2   CHOLECYSTECTOMY     COLONOSCOPY WITH PROPOFOL N/A 04/06/2022   Procedure: COLONOSCOPY WITH PROPOFOL;  Surgeon: Sharyn Creamer, MD;  Location: [facility];  Service: Gastroenterology;  Laterality: N/A;   HERNIA REPAIR     POLYPECTOMY  04/06/2022   Procedure: POLYPECTOMY;  Surgeon: Sharyn Creamer, MD;  Location: [facility];  Service: Gastroenterology;;   TONSILLECTOMY     Patient Active Problem List   Diagnosis Date Noted   Hyperglycemia due to type 2 diabetes mellitus ([place]) 12/28/2020   Hyperlipidemia 12/28/2020   Non-toxic goiter 12/28/2020   Proteinuria 12/28/2020   Thyroid nodule 12/28/2020   Pain in right hand 03/23/2020   Sprain of foot 06/27/2019   Synovial cyst of right popliteal space 05/15/2019   Spoon shaped nails 07/20/2018   Right second toe ulcer ([place]) 05/28/2018   Hidradenitis suppurativa 05/28/2018   Fat necrosis 05/28/2018   Osteoarthritis of knee 04/17/2018   MRSA (methicillin resistant staph aureus) culture positive 03/29/2018   DVT of  lower extremity, bilateral ([place]) 02/19/2018   Pain in right foot 12/07/2017   Leukocytosis 06/21/2016   Pulmonary emboli ([place]) 06/20/2016   Diabetes ([place]) 12/22/2012   Hypertension 12/22/2012   Morbid obesity with BMI of 60.0-69.9, adult ([place]) 07/08/2010   OVARIAN CYST 07/08/2010    REFERRING DIAG: Difficulty in walking, not elsewhere classified    THERAPY DIAG:  Muscle weakness (generalized)  Chronic pain of right knee  Chronic pain of left knee  Cramp and spasm  Abnormal posture  Difficulty in walking, not elsewhere classified  Rationale for Evaluation and Treatment Rehabilitation  PERTINENT HISTORY: NA  PRECAUTIONS: None  SUBJECTIVE: "I've been sick for a month and a half.  Had covid, then pneumonia.  Having next lumbar injection tomorrow and feels she will be able to get started again.     PAIN:  Are you having pain? Yes: NPRS scale: 7/10 Pain location: Bil knees RT > Lt, Rt ankle Pain description: sore, stiff Aggravating factors: WB Relieving factors: NWB  08/01/22: 9/10   OBJECTIVE: (objective measures completed at initial evaluation unless otherwise dated)   DIAGNOSTIC FINDINGS: none   PATIENT SURVEYS:  Initial eval: LEFS 10/80, 12.5% self percieved ability, 87.5% self percieved disability FOTO 11 (goal is 40)  03/22/22:  LEFS 17/80, 21% self perceived ability, 79% self perceived disability  FOTO : 26  05/19/21:  LEFS 16/80, 22% self perceived ability, 80% self perceived disability  FOTO : 35  08/01/22: LEFS 16/80, 22% self perceived ability, 80% self perceived disability  FOTO : 29  COGNITION:           Overall cognitive status: Within functional limits for tasks assessed                          SENSATION: WFL   POSTURE: flexed trunk    PALPATION: Mild crepitus bilateral knees   LOWER EXTREMITY ROM:   WFL   LOWER EXTREMITY MMT:   Generally 4/5 bilateral LE's   FUNCTIONAL TESTS:  Initial eval: 5 times sit to stand: 25.6 sec  (patient could not come to full stand so these reps were not full stand) Timed up and go (TUG): 48.6 sec (leaning fwd on forearms)  03/22/22: 5 times sit to stand: 30.84 sec (patient coming to full stand with upright posture) Timed up and go (TUG): 33.73 sec (Patient upright and not leaning on elbows)  05/19/21: 5 times sit to stand: 25.62 sec (patient coming to full stand with upright posture) Timed up and go (TUG): 27.68 sec (Patient upright and not leaning on elbows)  08/01/22: 5 times sit to stand: 31.41 sec (patient coming to full stand with upright posture) Timed up and go (TUG): 40.71 sec (Patient upright and not leaning on elbows)    GAIT: Distance walked: 50 feet  (05/19/22 now able to walk approx 100 ft with walker) Assistive device utilized: Environmental consultant - 2 wheeled and Wheelchair (manual) Level of assistance: Modified independence Comments: antalgic guarded       TODAY'S TREATMENT:  06/09/22:Pt arrives for aquatic physical therapy. Treatment took place in 3.5-5.5 feet of water. Water temperature was 91 degrees F. Pt entered the pool via stairs, stepping sideways then backwards holding onto 1 rail with both hands. Pt requires buoyancy of water for support and to offload joints with strengthening exercises.   75% depth: water walking for upright posture and back strength. 4 lengths in each direction then had to sit secondary to back pain and fatigue. Seated LAQ 10x Bil, semi-sit shoulder flex/ext VC to sit tall 10x, ankle DF/PF 10x Bil.    05/19/22: Progress note visit: see all testing above Sit to stand x 5, walking for distance with walker   05/04/22: Pt seen for aquatic therapy today.  Treatment took place in water 3.25-4 ft in depth at the [facility] pool. Temp of water was 91.  Pt entered the pool via stairs with step-to pattern and heavy UE on rail; pt exited via chair lift.  Stand pivot transfer from [facility] to stairs or from lift chair with supervision.  * holding  white barbell:  forward / backward gait with cues to allow Lt knee to flex during swing through - multiple laps across width of pool;  side stepping R/L 2 laps * seated at bench:  hip abdct/addct x 10; alternating LAQ x 10 with DF * holding wall:  heel/toe raises x 10, marching in place,  hip ext x 10 each * holding white barbell:  return to walking forward / backward * seated on bench with feet on blue step:  STS with UE on white barbell x 5 reps (cues for technique- improved)  Pt requires the buoyancy and hydrostatic pressure of water for support, and to offload joints by unweighting joint load by at least 50 % in navel deep water and by at  least 75-80% in chest to neck deep water.  Viscosity of the water is needed for resistance of strengthening. Water current perturbations provides challenge to standing balance requiring increased core activation.  HOME EXERCISE PROGRAM: Encouraged patient to continue to try and walk as much as tolerated.     ASSESSMENT:   CLINICAL IMPRESSION: Pt arrives 20 min late then needed to change into her swim ware which took additional time. Pt appears fatigued from her day at school and did not have the usual excitement to exercise in the water today. Time was main limiting factor but pt was forced to sit due to RT back and leg pain.    OBJECTIVE IMPAIRMENTS Abnormal gait, decreased activity tolerance, decreased balance, decreased mobility, difficulty walking, decreased ROM, decreased strength, impaired flexibility, postural dysfunction, obesity, and pain.    ACTIVITY LIMITATIONS carrying, lifting, bending, sitting, standing, squatting, stairs, transfers, bed mobility, bathing, toileting, and dressing   PARTICIPATION LIMITATIONS: meal prep, cleaning, laundry, driving, shopping, community activity, occupation, yard work, and church   [place], Profession, Time since onset of injury/illness/exacerbation, and 1-2 comorbidities: obesity, htn  are also  affecting patient's functional outcome.    REHAB POTENTIAL: Fair based on patients size   CLINICAL DECISION MAKING: Evolving/moderate complexity   EVALUATION COMPLEXITY: Moderate     GOALS: Goals reviewed with patient? Yes   SHORT TERM GOALS: Target date: 02/19/2022  Pain report to be no greater than 4/10  Baseline: Goal status: MET   LONG TERM GOALS: Target date: 07/14/22   Patient to report pain no greater than 2/10  Baseline: 4/10 - 05/04/22, 7/10 on 05/19/22 Goal status: Ongoing    2.  Patient to be able to walk 300 feet with rolling walker without knee instability safely Baseline: per report - 12 ft with rolling walker in home -05/04/22    100 ft on level surfaces - 05/19/22 Goal status: Ongoing    3.  Patient to be able to do all ADL's independently and safely Baseline: Unable to stand to cook, completes most other tasks in [facility] - 05/04/22 Goal status: Ongoing    4.  Patient to be able to do basic IADL's independently and safely. Baseline:  Goal status: MET -05/04/22   5.  Patient to report 85% improvement in overall condition and function Baseline: 50% reported -05/04/22 Goal status: Ongoing        PLAN: PT FREQUENCY: 1-2x/week   PT DURATION: for 8 additional weeks from 05/19/22 through 07/14/22   PLANNED INTERVENTIONS: Therapeutic exercises, Therapeutic activity, Neuromuscular re-education, Balance training, Gait training, Patient/Family education, Self Care, Joint mobilization, Stair training, Aquatic Therapy, Dry Needling, Electrical stimulation, Cryotherapy, Moist heat, Taping, Vasopneumatic device, Ultrasound, Manual therapy, and Re-evaluation   PLAN FOR NEXT SESSION: Aquatics therapy program.  Myrene Galas, PTA 08/01/22 10:28 PM    [facility] [address], [address] [place], [place] [address] Phone # [phone] Fax [phone]

## 2022-08-24 ENCOUNTER — Encounter (HOSPITAL_BASED_OUTPATIENT_CLINIC_OR_DEPARTMENT_OTHER): Payer: BC Managed Care – PPO | Admitting: Physical Therapy

## 2022-08-29 ENCOUNTER — Ambulatory Visit (HOSPITAL_BASED_OUTPATIENT_CLINIC_OR_DEPARTMENT_OTHER): Payer: BC Managed Care – PPO | Attending: Podiatry | Admitting: Physical Therapy

## 2022-08-29 DIAGNOSIS — M25561 Pain in right knee: Secondary | ICD-10-CM | POA: Insufficient documentation

## 2022-08-29 DIAGNOSIS — R252 Cramp and spasm: Secondary | ICD-10-CM | POA: Insufficient documentation

## 2022-08-29 DIAGNOSIS — M25562 Pain in left knee: Secondary | ICD-10-CM | POA: Diagnosis present

## 2022-08-29 DIAGNOSIS — M6281 Muscle weakness (generalized): Secondary | ICD-10-CM | POA: Insufficient documentation

## 2022-08-29 DIAGNOSIS — G8929 Other chronic pain: Secondary | ICD-10-CM | POA: Diagnosis present

## 2022-08-29 DIAGNOSIS — R293 Abnormal posture: Secondary | ICD-10-CM | POA: Insufficient documentation

## 2022-08-29 NOTE — Therapy (Signed)
OUTPATIENT PHYSICAL THERAPY REASSESSMENT/ RECERT NOTE   Patient Name: Natasha Rollins MRN: [id number] DOB:[birth year], 62 y.o., female Today's Date: 08/29/2022.     PCP: Mila Palmer, MD  REFERRING PROVIDER: Vivi Barrack, DPM   END OF SESSION:   PT End of Session - 08/29/22 1717     Visit Number 14    Date for PT Re-Evaluation 09/26/22    Authorization Type BCBS    PT Start Time 1709   pt arrived late to pool area   PT Stop Time 1748    PT Time Calculation (min) 39 min    Behavior During Therapy [facility] for tasks assessed/performed                  Past Medical History:  Diagnosis Date   Diabetes mellitus without complication (HCC)    Fat necrosis 05/28/2018   Hidradenitis suppurativa 05/28/2018   Hypertension    Morbid obesity (HCC)    Right second toe ulcer (HCC) 05/28/2018   Past Surgical History:  Procedure Laterality Date   CESAREAN SECTION     x2   CHOLECYSTECTOMY     COLONOSCOPY WITH PROPOFOL N/A 04/06/2022   Procedure: COLONOSCOPY WITH PROPOFOL;  Surgeon: Imogene Burn, MD;  Location: [facility];  Service: Gastroenterology;  Laterality: N/A;   HERNIA REPAIR     POLYPECTOMY  04/06/2022   Procedure: POLYPECTOMY;  Surgeon: Imogene Burn, MD;  Location: [facility];  Service: Gastroenterology;;   TONSILLECTOMY     Patient Active Problem List   Diagnosis Date Noted   Hyperglycemia due to type 2 diabetes mellitus 12/28/2020   Hyperlipidemia 12/28/2020   Non-toxic goiter 12/28/2020   Proteinuria 12/28/2020   Thyroid nodule 12/28/2020   Pain in right hand 03/23/2020   Sprain of foot 06/27/2019   Synovial cyst of right popliteal space 05/15/2019   Spoon shaped nails 07/20/2018   Right second toe ulcer 05/28/2018   Hidradenitis suppurativa 05/28/2018   Fat necrosis 05/28/2018   Osteoarthritis of knee 04/17/2018   MRSA (methicillin resistant staph aureus) culture positive 03/29/2018   DVT of lower extremity, bilateral 02/19/2018   Pain in  right foot 12/07/2017   Leukocytosis 06/21/2016   Pulmonary emboli 06/20/2016   Diabetes (HCC) 12/22/2012   Hypertension 12/22/2012   Morbid obesity with BMI of 60.0-69.9, adult 07/08/2010   OVARIAN CYST 07/08/2010    REFERRING DIAG: Difficulty in walking, not elsewhere classified    THERAPY DIAG:  Muscle weakness (generalized)  Chronic pain of right knee  Chronic pain of left knee  Cramp and spasm  Abnormal posture  Rationale for Evaluation and Treatment Rehabilitation  PERTINENT HISTORY: NA  PRECAUTIONS: None  SUBJECTIVE: "I've been sick for a month and a half.  Had covid, then pneumonia.  Having next lumbar injection tomorrow and feels she will be able to get started again.     PAIN:  Are you having pain? Yes: NPRS scale: 7/10 Pain location: Bil knees RT > Lt, Rt ankle Pain description: sore, stiff Aggravating factors: WB Relieving factors: NWB  08/01/22: 9/10   OBJECTIVE: (objective measures completed at initial evaluation unless otherwise dated)   DIAGNOSTIC FINDINGS: none   PATIENT SURVEYS:  Initial eval: LEFS 10/80, 12.5% self percieved ability, 87.5% self percieved disability FOTO 11 (goal is 40)  03/22/22:  LEFS 17/80, 21% self perceived ability, 79% self perceived disability  FOTO : 26  05/19/21:  LEFS 16/80, 22% self perceived ability, 80% self perceived disability  FOTO : 35  08/01/22:  LEFS 7/80, 8.75% self perceived ability, 91.25% self perceived disability  FOTO : 29  COGNITION:           Overall cognitive status: Within functional limits for tasks assessed                          SENSATION: WFL   POSTURE: flexed trunk    PALPATION: Mild crepitus bilateral knees   LOWER EXTREMITY ROM:   WFL   LOWER EXTREMITY MMT:   Generally 4/5 bilateral LE's   FUNCTIONAL TESTS:  Initial eval: 5 times sit to stand: 25.6 sec (patient could not come to full stand so these reps were not full stand) Timed up and go (TUG): 48.6 sec (leaning  fwd on forearms)  03/22/22: 5 times sit to stand: 30.84 sec (patient coming to full stand with upright posture) Timed up and go (TUG): 33.73 sec (Patient upright and not leaning on elbows)  05/19/21: 5 times sit to stand: 25.62 sec (patient coming to full stand with upright posture) Timed up and go (TUG): 27.68 sec (Patient upright and not leaning on elbows)  08/01/22: 5 times sit to stand: 31.41 sec (patient coming to full stand with upright posture) Timed up and go (TUG): 40.71 sec (Patient upright and not leaning on elbows)    GAIT: Distance walked: 20 feet  (05/19/22 now able to walk approx 100 ft with walker) (08/02/22 currently mostly in wheelchair since illness but 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   Comments: antalgic guarded       TODAY'S TREATMENT: 08/29/22: Pt seen for aquatic therapy today.  Treatment took place in water 3.25-4 ft in depth at the [facility] pool. Temp of water was 91.  Pt entered/exited the pool via stairs with step-to pattern and heavy UE on rail.   * holding white barbell:  forward / backward gait with cues to allow Lt knee to flex during swing through - 3 laps across width of pool * seated at bench:  hip abdct/addct x 10; alternating LAQ x 10 with DF; cycling *holding barbell:  side stepping R/L 2 laps - cues for upright posture (pt in 24ft of water) ;return to walking forward/backward (feels best when facing hot tub) * holding wall:  heel/toe raises x 10, marching in place,  hip abdct/ addct x 10 (cues for upright posture) * holding white barbell:  return to walking forward / backward * discussed pt looking  into aquatic center and [facility] prior to next session.   Pt requires the buoyancy and hydrostatic pressure of water for support, and to offload joints by unweighting joint load by at least 50 % in navel deep water and by at least 75-80% in chest to neck deep water.  Viscosity of the water is needed for resistance of strengthening. Water current perturbations provides challenge to standing balance requiring increased core activation.  08/01/22: Recert visit and re-assessment completed Reviewed HEP and educated on all factors that she needs to be successful at her rehab, discussed need for consistency and pushing through on days when she is struggling so that she can meet her goals Discussed  need for DC planning and researching a pool that she will have easy access to so that she will be able to continue to work independently toward her ultimate goals of weight loss and walking without walker and avoiding or being a candidate for joint replacements  06/09/22:Pt arrives for aquatic physical therapy. Treatment took place in 3.5-5.5 feet of water. Water temperature was 91 degrees F. Pt entered the pool via stairs, stepping sideways then backwards holding onto 1 rail with both hands. Pt requires buoyancy of water for support and to offload joints with strengthening exercises.   75% depth: water walking for upright posture and back strength. 4 lengths in each direction then had to sit secondary to back pain and fatigue. Seated LAQ 10x Bil, semi-sit shoulder flex/ext VC to sit tall 10x, ankle DF/PF 10x Bil.    05/19/22: Progress note visit: see all testing above Sit to stand x 5, walking for distance with walker   05/04/22: Pt seen for aquatic therapy today.  Treatment took place in water 3.25-4 ft in depth at the [facility] pool. Temp of water was 91.  Pt entered the pool via stairs with step-to pattern and heavy UE on rail; pt exited via chair lift.  Stand pivot transfer from [facility] to stairs or  from lift chair with supervision.  * holding white barbell:  forward / backward gait with cues to allow Lt knee to flex during swing through - multiple laps across width of pool;  side stepping R/L 2 laps * seated at bench:  hip abdct/addct x 10; alternating LAQ x 10 with DF * holding wall:  heel/toe raises x 10, marching in place,  hip ext x 10 each * holding white barbell:  return to walking forward / backward * seated on bench with feet on blue step:  STS with UE on white barbell x 5 reps (cues for technique- improved)  Pt requires the buoyancy and hydrostatic pressure of water for support, and to offload joints by unweighting joint load by at least 50 % in navel deep water and by at least 75-80% in chest to neck deep water.  Viscosity of the water is needed for resistance of strengthening. Water current perturbations provides challenge to standing balance requiring increased core activation.  HOME EXERCISE PROGRAM: Encouraged patient to continue to try and walk as much as tolerated.     ASSESSMENT:   CLINICAL IMPRESSION:  Pt able to enter and exit pool via stairs (with step-to pattern and heavy use of rail).  She requested seated break on bench after walking in water for ~5 min.  When returning to walking, encouraged pt to move to 4 ft depth of pool - and she was able to maintain vertical trunk with less pain.  She requires cues throughout session to allow Lt knee to flex during swing through, and for more upright posture.  Encouraged pt to check in to facilities with pools prior to next session so that we can set her up for success with compliance at d/c.  Transportation (via [facility]) from car to facility is a barrier at this time, as right now she relies on her husband for this (and therapist to transport to shower after therapy sessions).         From Re-assessment:  Gwen has been away from rehab due to covid, then pneumonia.  She has finally recovered from this but the illness caused her  to decline in function.  Her back pain has worsened again and  she will be having injections tomorrow 08/02/22.  Knee pain still present bilaterally and knees have become stiff and sore from immobility.  She is also back to leaning on her elbows to ambulate with walker, unable to maintain upright posture.  We had a lengthy discussion about the multiple orthopedic issues she is facing and the diligence and compliance it will take to reach her goals.  We discussed importance of being set up at a pool that she will have easy access to upon DC from rehab to continue working toward her goals.  She returns understanding of all of this and is anxious to resume her rehab.   Upon scheduling, patient was only able to get 1 appointment for the next few weeks because of schedule constraints with her job.  She was encouraged to use the MyChart system to utilize the wait list option as this has been very successful for other patients but patient states she doesn't know how to use MyChart.  Scheduler assisted patient with how to use MyChart and patient states she will try to make use of this.  We will recertify patient for 8 more weeks of therapy at 1-2 times per week.  Emphasized consistency to patient and encouraged her to be looking for a pool with easy access for her to be able to continue toward her goals once she is Dc'd from rehab.     OBJECTIVE IMPAIRMENTS Abnormal gait, decreased activity tolerance, decreased balance, decreased mobility, difficulty walking, decreased ROM, decreased strength, impaired flexibility, postural dysfunction, obesity, and pain.    ACTIVITY LIMITATIONS carrying, lifting, bending, sitting, standing, squatting, stairs, transfers, bed mobility, bathing, toileting, and dressing   PARTICIPATION LIMITATIONS: meal prep, cleaning, laundry, driving, shopping, community activity, occupation, yard work, and church   PERSONAL Land, Profession, Time since onset of injury/illness/exacerbation,  and 1-2 comorbidities: obesity, htn  are also affecting patient's functional outcome.    REHAB POTENTIAL: Fair based on patients size   CLINICAL DECISION MAKING: Evolving/moderate complexity   EVALUATION COMPLEXITY: Moderate     GOALS: Goals reviewed with patient? Yes   SHORT TERM GOALS: Target date: 08/30/2022   Pain report to be no greater than 4/10  Baseline: Goal status: Reset   LONG TERM GOALS: Target date: 09/27/2022   Patient to report pain no greater than 2/10  Baseline: 4/10 - 05/04/22, 7/10 on 05/19/22 Goal status: Ongoing    2.  Patient to be able to walk 300 feet with rolling walker without knee instability safely Baseline: per report - 12 ft with rolling walker in home -05/04/22    100 ft on level surfaces - 05/19/22    12 ft with rw in home and approx 30 feet at school for going to/from bathroom and car- 08/01/22 Goal status: Ongoing    3.  Patient to be able to do all ADL's independently and safely Baseline: Unable to stand to cook, completes most other tasks in [facility] - 05/04/22 (unchanged 08/02/22) Goal status: Ongoing    4.  Patient to be able to do basic IADL's independently and safely. Baseline:  Goal status: Reset   5.  Patient to report 85% improvement in overall condition and function Baseline: 50% reported -05/04/22,  reset to 0% due to illness 08/01/22 Goal status: Ongoing        PLAN: PT FREQUENCY: 1-2x/week   PT DURATION: for 8 additional weeks from 08/01/22 through 09/27/22   PLANNED INTERVENTIONS: Therapeutic exercises, Therapeutic activity, Neuromuscular re-education, Balance training, Gait training, Patient/Family education,  Self Care, Joint mobilization, Stair training, Aquatic Therapy, Dry Needling, Electrical stimulation, Cryotherapy, Moist heat, Taping, Vasopneumatic device, Ultrasound, Manual therapy, and Re-evaluation   PLAN FOR NEXT SESSION: Aquatics therapy program.  Mayer Camel, PTA 08/29/22 5:47 PM [facility]  [facility] [address] [place], [place], [address] Phone: [phone]

## 2022-08-31 ENCOUNTER — Ambulatory Visit (HOSPITAL_BASED_OUTPATIENT_CLINIC_OR_DEPARTMENT_OTHER): Payer: BC Managed Care – PPO | Admitting: Physical Therapy

## 2022-08-31 ENCOUNTER — Encounter (HOSPITAL_BASED_OUTPATIENT_CLINIC_OR_DEPARTMENT_OTHER): Payer: Self-pay

## 2022-09-05 ENCOUNTER — Ambulatory Visit (HOSPITAL_BASED_OUTPATIENT_CLINIC_OR_DEPARTMENT_OTHER): Payer: BC Managed Care – PPO | Admitting: Physical Therapy

## 2022-09-05 ENCOUNTER — Encounter (HOSPITAL_BASED_OUTPATIENT_CLINIC_OR_DEPARTMENT_OTHER): Payer: Self-pay | Admitting: Physical Therapy

## 2022-09-05 DIAGNOSIS — R252 Cramp and spasm: Secondary | ICD-10-CM

## 2022-09-05 DIAGNOSIS — G8929 Other chronic pain: Secondary | ICD-10-CM

## 2022-09-05 DIAGNOSIS — M6281 Muscle weakness (generalized): Secondary | ICD-10-CM

## 2022-09-05 NOTE — Therapy (Signed)
[facility] at [facility] [address] [place], [place], [address] Phone: [phone]   Fax:  [phone]  Patient Details  Name: [name] MRN: [id number] Date of Birth: 1961/02/22 Referring Provider:  Vivi Barrack, DPM  Encounter Date: 09/05/2022  Patient arrived 22 min late for aquatic therapy session.  Patient given option for shortened treatment session and declined.  Discussed what her plan is for seeking out pool for continuation of HEP at d/c. Patient is still looking at pools and reported she needs to consider who will transport her via [facility] to/from pool to car.  Encouraged continued investigation of resources.    Patient not treated  - no charge.   Mayer Camel, PTA 09/05/22 5:45 PM

## 2022-09-07 ENCOUNTER — Ambulatory Visit (HOSPITAL_BASED_OUTPATIENT_CLINIC_OR_DEPARTMENT_OTHER): Payer: BC Managed Care – PPO | Admitting: Physical Therapy

## 2022-09-11 ENCOUNTER — Ambulatory Visit: Payer: BC Managed Care – PPO | Admitting: Podiatry

## 2022-09-11 ENCOUNTER — Encounter: Payer: Self-pay | Admitting: Podiatry

## 2022-09-11 DIAGNOSIS — S96911S Strain of unspecified muscle and tendon at ankle and foot level, right foot, sequela: Secondary | ICD-10-CM

## 2022-09-11 DIAGNOSIS — M7751 Other enthesopathy of right foot: Secondary | ICD-10-CM | POA: Diagnosis not present

## 2022-09-11 NOTE — Progress Notes (Unsigned)
Subjective: Chief Complaint  Patient presents with   Ankle Pain    6 week follow up right ankle pain    62 year old female presents the office today for follow evaluation of bilateral foot discomfort.  Since I saw her last she had injections back and she states the pain to her right leg was much better since the injection.  She still gets some tenderness more to the lateral aspect the right side.  No recent injury or falls.  She states that after the injection she was able to be more active.  She really does not like aquatic therapy as she is able to walk in the pool when she feels more normal.   Objective: AAO x3, NAD DP/PT pulses palpable bilaterally, CRT less than 3 seconds There is discomfort along the sinus tarsi bilaterally although does seem to be improved on the right side.  No area pinpoint tenderness noted today.  There is no pain along the left fourth toe.  There is no other areas of discomfort noted today. No pain with calf compression, swelling, warmth, erythema  Assessment: 62 year old female left fourth toe fracture, capsulitis right foot  Plan: -Acuity of her symptoms are still coming from her back and nerve issues.  She had significant treatment for the injection.  Will continue to monitor.  We discussed doing a steroid injection of the sinus tarsi but she recently just had a steroid injection in her back as well as her knees so we will hold off on this on her foot for now we will likely plan on this next appointment.  Continue with physical therapy.  Weight-bear as tolerated.   Return in about 6 weeks (around 10/23/2022).  Vivi Barrack DPM

## 2022-09-15 ENCOUNTER — Ambulatory Visit: Payer: BC Managed Care – PPO | Admitting: Physical Therapy

## 2022-09-19 ENCOUNTER — Ambulatory Visit (HOSPITAL_BASED_OUTPATIENT_CLINIC_OR_DEPARTMENT_OTHER): Payer: BC Managed Care – PPO | Admitting: Physical Therapy

## 2022-09-21 ENCOUNTER — Ambulatory Visit (HOSPITAL_BASED_OUTPATIENT_CLINIC_OR_DEPARTMENT_OTHER): Payer: BC Managed Care – PPO | Admitting: Physical Therapy

## 2022-09-26 ENCOUNTER — Ambulatory Visit: Payer: BC Managed Care – PPO

## 2022-10-21 ENCOUNTER — Ambulatory Visit (HOSPITAL_COMMUNITY)
Admission: RE | Admit: 2022-10-21 | Discharge: 2022-10-21 | Disposition: A | Discharge status: Home / Self Care | Payer: BC Managed Care – PPO | Source: Ambulatory Visit | Attending: Physician Assistant | Admitting: Physician Assistant

## 2022-10-21 ENCOUNTER — Encounter (HOSPITAL_COMMUNITY): Payer: Self-pay

## 2022-10-21 ENCOUNTER — Other Ambulatory Visit: Payer: Self-pay

## 2022-10-21 VITALS — BP 125/74 | HR 86 | Temp 98.3°F | Resp 18

## 2022-10-21 DIAGNOSIS — L03115 Cellulitis of right lower limb: Secondary | ICD-10-CM | POA: Diagnosis not present

## 2022-10-21 MED ORDER — CEPHALEXIN 500 MG PO CAPS: 500.0000 mg | ORAL_CAPSULE | Freq: Three times a day (TID) | ORAL | 0 refills | Status: AC

## 2022-10-21 NOTE — ED Triage Notes (Addendum)
Pt reports swelling ans redness to skin on RT ankle. Pt reports burning to skin.

## 2022-10-21 NOTE — ED Provider Notes (Signed)
[facility]   MRN: [id number] DOB: 1960/05/21  Subjective:   Natasha Rollins is a 62 y.o. female presenting for right lower extremity ?shiny/ red appearance.  Feels like she might be a little more swollen than normal as well.  States that she was at her back specialist this week and he said that she might want to get this checked out as he did not think this was connected to her pinched nerve.  She has a history of PE, anti-coagulated on warfarin. States she has been in the therapeutic range, followed by PCP, records not available for review here. No CP or SOB. Denies calf pain.   No current facility-administered medications for this encounter.  Current Outpatient Medications:    amLODipine (NORVASC) 10 MG tablet, Take 10 mg by mouth daily., Disp: , Rfl: 8   B-D UF III MINI PEN NEEDLES 31G X 5 MM MISC, USE AS DIRECTED 5 TIMES A DAY, Disp: , Rfl:    Blood Glucose Monitoring Suppl (FREESTYLE FREEDOM LITE) w/Device KIT, See admin instructions., Disp: , Rfl:    cephALEXin (KEFLEX) 500 MG capsule, Take 1 capsule (500 mg total) by mouth 3 (three) times daily for 7 days., Disp: 21 capsule, Rfl: 0   folic acid (FOLVITE) 1 MG tablet, Take 1 mg by mouth daily., Disp: , Rfl:    JANUVIA 100 MG tablet, Take 100 mg by mouth daily., Disp: , Rfl: 4   Lancets (ONETOUCH DELICA PLUS LANCET30G) MISC, USE AS DIRECTED ONCE A DAY., Disp: , Rfl:    LEVEMIR FLEXTOUCH 100 UNIT/ML Pen, Inject 75 Units into the skin 2 (two) times daily. , Disp: , Rfl:    losartan-hydrochlorothiazide (HYZAAR) 100-25 MG tablet, Take 1 tablet by mouth daily., Disp: , Rfl: 4   NOVOLOG FLEXPEN 100 UNIT/ML FlexPen, Inject 30 Units into the skin 3 (three) times daily with meals. , Disp: , Rfl:    ONETOUCH VERIO test strip, , Disp: , Rfl:    pregabalin (LYRICA) 50 MG capsule, pregabalin 50 mg capsule  TAKE 1 CAPSULE BY MOUTH TWICE A DAY, Disp: , Rfl:    rosuvastatin (CRESTOR) 10 MG tablet, rosuvastatin 10 mg tablet  TAKE  1 TABLET BY MOUTH FOR 3 DAYS A WEEK, Disp: , Rfl:    Vitamin D, Ergocalciferol, (DRISDOL) 1.25 MG (50000 UNIT) CAPS capsule, Take 50,000 Units by mouth once a week., Disp: , Rfl:    warfarin (COUMADIN) 5 MG tablet, Take 1 tablet (5 mg total) by mouth daily at 6 PM., Disp: 30 tablet, Rfl: 0   allopurinol (ZYLOPRIM) 100 MG tablet, Take 1 tablet (100 mg total) by mouth daily., Disp: 30 tablet, Rfl: 6   allopurinol (ZYLOPRIM) 100 MG tablet, , Disp: , Rfl:    amLODipine (NORVASC) 10 MG tablet, Take 1 tablet by mouth daily., Disp: , Rfl:    atorvastatin (LIPITOR) 10 MG tablet, TAKE 1 TABLET 3 DAYS A WEEK, Disp: , Rfl:    azithromycin (ZITHROMAX) 250 MG tablet, Take 1 tablet (250 mg total) by mouth daily. Take first 2 tablets together, then 1 every day until finished., Disp: 6 tablet, Rfl: 0   benzonatate (TESSALON) 100 MG capsule, Take 1 capsule (100 mg total) by mouth every 8 (eight) hours., Disp: 15 capsule, Rfl: 0   bisacodyl (DULCOLAX) 5 MG EC tablet, Take 4 tablets at 3:00 pm on 04/05/22, Disp: 4 tablet, Rfl: 0   clindamycin (CLEOCIN T) 1 % lotion, Apply topically daily. to face, Disp: , Rfl:  clotrimazole-betamethasone (LOTRISONE) cream, , Disp: , Rfl:    colchicine 0.6 MG tablet, Take 1 tablet (0.6 mg total) by mouth daily., Disp: 10 tablet, Rfl: 0   gabapentin (NEURONTIN) 100 MG capsule, , Disp: , Rfl:    gabapentin (NEURONTIN) 300 MG capsule, Take 1 capsule (300 mg total) by mouth at bedtime., Disp: 90 capsule, Rfl: 0   HYDROcodone-acetaminophen (NORCO/VICODIN) 5-325 MG tablet, , Disp: , Rfl:    hydrocortisone 2.5 % lotion, , Disp: , Rfl:    metroNIDAZOLE (METROGEL) 1 % gel, , Disp: , Rfl:    mupirocin ointment (BACTROBAN) 2 %, Apply topically., Disp: , Rfl:    OZEMPIC, 1 MG/DOSE, 4 MG/3ML SOPN, Inject 1 mg into the skin once a week., Disp: , Rfl:    polyethylene glycol (COLYTE WITH FLAVOR PACKS) 240 g solution, Take as a split dose following doctor instructions, Disp: 4000 mL, Rfl: 0    traMADol (ULTRAM) 50 MG tablet, , Disp: , Rfl:    Allergies  Allergen Reactions   Enoxaparin Other (See Comments)   Metformin Hcl Other (See Comments)   Tramadol Other (See Comments)   Vitamin D     Other Reaction(s): Rash on face   Penicillins Itching and Rash   Saxagliptin Rash    Past Medical History:  Diagnosis Date   Diabetes mellitus without complication (HCC)    Fat necrosis 05/28/2018   Hidradenitis suppurativa 05/28/2018   Hypertension    Morbid obesity (HCC)    Right second toe ulcer (HCC) 05/28/2018     Past Surgical History:  Procedure Laterality Date   CESAREAN SECTION     x2   CHOLECYSTECTOMY     COLONOSCOPY WITH PROPOFOL N/A 04/06/2022   Procedure: COLONOSCOPY WITH PROPOFOL;  Surgeon: Imogene Burn, MD;  Location: [facility];  Service: Gastroenterology;  Laterality: N/A;   HERNIA REPAIR     POLYPECTOMY  04/06/2022   Procedure: POLYPECTOMY;  Surgeon: Imogene Burn, MD;  Location: [facility];  Service: Gastroenterology;;   TONSILLECTOMY      Family History  Problem Relation Age of Onset   Colon cancer Neg Hx    Stomach cancer Neg Hx    Esophageal cancer Neg Hx    Colon polyps Neg Hx     Social History   Tobacco Use   Smoking status: Never   Smokeless tobacco: Never  Vaping Use   Vaping Use: Never used  Substance Use Topics   Alcohol use: No   Drug use: No    ROS REFER TO HPI FOR PERTINENT POSITIVES AND NEGATIVES   Objective:   Vitals: BP 125/74   Pulse 86   Temp 98.3 F (36.8 C)   Resp 18   SpO2 92%   Physical Exam Vitals and nursing note reviewed.  Constitutional:      Appearance: She is obese.     Comments: wheelchair  Cardiovascular:     Rate and Rhythm: Normal rate and regular rhythm.     Pulses: Normal pulses.     Heart sounds: No murmur heard. Pulmonary:     Effort: Pulmonary effort is normal. No respiratory distress.     Breath sounds: Normal breath sounds. No wheezing.  Musculoskeletal:     Right lower leg:  Edema present.     Left lower leg: Edema present.     Comments: Bilateral lower extremity edema 1+, equal bilaterally.  Homan's sign neg  Skin:    Findings: Erythema (shiny appearance R shin, slight erythema) present.  Neurological:     General: No focal deficit present.     Mental Status: She is alert and oriented to person, place, and time.  Psychiatric:        Mood and Affect: Mood normal.     No results found for this or any previous visit (from the past 24 hour(s)).  Assessment and Plan :   PDMP not reviewed this encounter.  1. Cellulitis of right anterior lower leg    Discussed with patient that this may be early cellulitis right lower extremity.  Doubt DVT as she is anticoagulated on warfarin and says that her ranges have been therapeutic, report unavailable for review.  Start on Keflex as directed.  Elevation.  Compression stockings.  She will follow-up with her primary care.  Red flag symptoms discussed with her and her husband.  They are agreeable and understanding.    AllwardtCrist Infante, PA-C 10/21/22 1453

## 2022-10-21 NOTE — Discharge Instructions (Signed)
This may be an early skin infection of your right lower leg.  You can take the Keflex 3 times daily for 5 to 7 days.  Please do your best to elevate this leg above heart level.  Use your compression stockings.  Limit salt and stay hydrated.  Should you develop severe pain or swelling or redness in the calf, or if you have shortness of breath or chest pain, present to the emergency department right away.

## 2022-10-27 ENCOUNTER — Ambulatory Visit: Payer: BC Managed Care – PPO | Admitting: Podiatry

## 2022-11-23 ENCOUNTER — Encounter: Payer: Self-pay | Admitting: Podiatry

## 2022-11-23 ENCOUNTER — Ambulatory Visit: Payer: BC Managed Care – PPO | Admitting: Podiatry

## 2022-11-23 DIAGNOSIS — M7751 Other enthesopathy of right foot: Secondary | ICD-10-CM | POA: Diagnosis not present

## 2022-11-23 DIAGNOSIS — M7752 Other enthesopathy of left foot: Secondary | ICD-10-CM

## 2022-11-23 NOTE — Patient Instructions (Signed)

## 2022-11-28 ENCOUNTER — Encounter: Payer: Self-pay | Admitting: Podiatry

## 2022-11-29 NOTE — Progress Notes (Signed)
Subjective: Chief Complaint  Patient presents with   Foot Pain    Right foot and ankle pain and swelling, patient like an injection today     62 year old female presents the office today for follow evaluation of bilateral foot discomfort.  She having any pain to the outside aspect of both her ankles and she is asked about steroid injection on both sides.  No recent injuries.  She states that she is still trying to therapy.  She has been working on try to get accommodations for her house as well.   Objective: AAO x3, NAD DP/PT pulses palpable bilaterally, CRT less than 3 seconds There is discomfort along the sinus tarsi bilaterally and there is some local edema present to this area.  No erythema or warmth.  There is no area of pinpoint tenderness. No pain with calf compression, swelling, warmth, erythema  Assessment: 62 year old female capsulitis bilateral feet.   Plan: -Although do think a lot of her symptoms are still coming from her back, nerve issues she still gets capsulitis likely from gait instability to bilateral sinus tarsi.  We discussed risks of repeat injection she fully understands this and she wishes to proceed.  Skin was prepped with Betadine, alcohol mixture of 1 mL of Kenalog 10, 0.5 cc of Marcaine plain, 0.5 cc of lidocaine plain was infiltrated into the sinus tarsi without complications.  Postinjection care was discussed.  Tolerated the injections well. -Continue physical therapy -Continue to follow-up with her back doctor as well  Return in about 8 weeks (around 01/18/2023).  Vivi Barrack DPM

## 2023-01-19 ENCOUNTER — Ambulatory Visit: Payer: BC Managed Care – PPO | Admitting: Podiatry

## 2023-02-15 ENCOUNTER — Ambulatory Visit: Payer: BC Managed Care – PPO | Admitting: Podiatry

## 2023-02-15 DIAGNOSIS — M7751 Other enthesopathy of right foot: Secondary | ICD-10-CM

## 2023-02-21 NOTE — Progress Notes (Signed)
Subjective: No chief complaint on file.    62 year old female presents the office today for follow evaluation of bilateral foot discomfort.  Most of her pain today is in the right ankle.  She does not report any recent injury or changes.  She is trying to work with physical therapy.  She is requesting steroid injection of the right ankle today.   Objective: AAO x3, NAD DP/PT pulses palpable bilaterally, CRT less than 3 seconds While she does get discomfort on sinus tarsi majority tenderness is on the anterior lateral aspect of the ankle joint.  There is no pain or crepitation with ankle joint range of motion.  There is some slight edema there is no erythema or warmth.  No other area pinpoint tenderness.  She does get some discomfort along the second or space of the right foot as well.  No palpable neuroma identified at this time. No pain with calf compression, swelling, warmth, erythema  Assessment: 62 year old female capsulitis right ankle, chronic bilateral foot pain  Plan: -Treatment options discussed including all alternatives, risks, and complications -Etiology of symptoms were discussed -We can discuss I think her symptoms to her feet are multifactorial.  Although she does have foot issues I think some of this is also neurological in origin and she agrees.  She wants to proceed with a steroid injection of the right ankle today.  Discussed risks and verbal consent obtained.  I cleaned the skin with Betadine, alcohol.  A mixture of 1 cc Kenalog 10, 0.5 cc of Marcaine plain, 0.5 cc of lidocaine plain was infiltrated into the anterior lateral aspect of the ankle joint without complications.  Postinjection care discussed.  Tolerated well. -Continue physical therapy.  No follow-ups on file.  Vivi Barrack DPM

## 2023-03-22 ENCOUNTER — Telehealth: Payer: Self-pay | Admitting: Podiatry

## 2023-03-22 NOTE — Telephone Encounter (Signed)
Patient stated she was in a great deal of pain and can no longer walk. She would like to know if she could see you tomorrow or sometime next week. But as soon as possible.  Thanks

## 2023-03-22 NOTE — Telephone Encounter (Signed)
Pt called back crying said she is in so much pain she cannot walk of foot.She satated she was in physical therapy earlier this week and maybe the cause.   I have pt scheduled to see Dr Jamse Arn tomorrow at 1145. She will need help getting into the building.

## 2023-03-23 ENCOUNTER — Ambulatory Visit: Payer: BC Managed Care – PPO | Admitting: Podiatry

## 2023-03-23 ENCOUNTER — Encounter: Payer: Self-pay | Admitting: Podiatry

## 2023-03-23 DIAGNOSIS — M7752 Other enthesopathy of left foot: Secondary | ICD-10-CM

## 2023-03-23 MED ORDER — DICLOFENAC SODIUM 1 % EX GEL: 2.0000 g | Freq: Four times a day (QID) | CUTANEOUS | 0 refills | Status: AC

## 2023-03-25 NOTE — Progress Notes (Signed)
Chief Complaint  Patient presents with   Foot Pain    Diabetic with severe left ankle pain started Monday night after physical therapy.  Left foot is swollen.  Not able to walk.    HPI: 62 y.o. female presents today for tenderness to left ankle after working with physical therapy.  Patient states that over the past several months, her weightbearing activity has been extremely limited and she probably dominantly uses wheelchair.  She typically sees Dr. Loreta Ave for her left ankle pain.  She has a neurologist to have sent her to physical therapy for issues including the left ankle pain.  Patient states that her physical therapy prescription strictly for aquatic therapy only due to her weightbearing limitations, she states that her last session the therapist was working with her on standard weightbearing therapy and since then she has had significant left ankle pain and swelling.  Patient is diabetic.  She denies any nausea, vomiting, fever, chills, chest pain, shortness of breath.  Past Medical History:  Diagnosis Date   Diabetes mellitus without complication (HCC)    Fat necrosis 05/28/2018   Hidradenitis suppurativa 05/28/2018   Hypertension    Morbid obesity (HCC)    Right second toe ulcer (HCC) 05/28/2018    Past Surgical History:  Procedure Laterality Date   CESAREAN SECTION     x2   CHOLECYSTECTOMY     COLONOSCOPY WITH PROPOFOL N/A 04/06/2022   Procedure: COLONOSCOPY WITH PROPOFOL;  Surgeon: Imogene Burn, MD;  Location: [facility];  Service: Gastroenterology;  Laterality: N/A;   HERNIA REPAIR     POLYPECTOMY  04/06/2022   Procedure: POLYPECTOMY;  Surgeon: Imogene Burn, MD;  Location: [facility];  Service: Gastroenterology;;   TONSILLECTOMY      Allergies  Allergen Reactions   Enoxaparin Other (See Comments)   Metformin Hcl Other (See Comments)   Tramadol Other (See Comments)   Vitamin D     Other Reaction(s): Rash on face   Penicillins Itching and Rash    Saxagliptin Rash    ROS negative except as stated HPI   Physical Exam: There were no vitals filed for this visit.  General: The patient is alert and oriented x3 in no acute distress.  Presenting to clinic in wheelchair with surgical shoe to the left foot  Dermatology: Skin is warm, dry and supple bilateral lower extremities. Interspaces are clear of maceration and debris.    Vascular: Palpable pedal pulses bilaterally. Capillary refill within normal limits.  Left lower extremity edema presents today.  No erythema or calor.  Neurological: Light touch sensation grossly intact bilateral feet.   Musculoskeletal Exam: Most of the patient's pain appears correlated with the lateral and anterolateral ankle today.  No significant pain on palpation of the sinus tarsi.  No pain on palpation of the peroneal tendons.  No crepitus on examination.  No pain on palpation of medial ankle.   Assessment/Plan of Care: 1. Capsulitis of left ankle      Meds ordered this encounter  Medications   diclofenac Sodium (VOLTAREN) 1 % GEL    Sig: Apply 2 g topically 4 (four) times daily for 21 days.    Dispense:  150 g    Refill:  0   None  Discussed clinical findings with patient today.  # Left ankle pain and capsulitis -Clinical findings and treatment plan discussed with patient - Unfortunately she did recently have ankle joint injections, I do believe it is probably too  soon to inject these areas again. This can be considered if other modalities do not provide relief -Discussed RICE therapy with patient.  Prescribing patient Voltaren gel as well be applied as needed for left ankle pain. -Dispensing Tri-Lock ankle brace which should provide some local compression to the area as well as provide more support when she must do some weightbearing ie for transfers and at therapy. -She does have upcoming appointment in several weeks with Dr. Ardelle Anton, advised patient to keep this appointment.   Jerzie Bieri L. Marchia Bond, AACFAS [facility]     [address] [place], [place] [address]                Office [phone]  Fax [phone]

## 2023-04-02 ENCOUNTER — Telehealth: Payer: Self-pay

## 2023-04-02 NOTE — Telephone Encounter (Signed)
PA was denied. Patient did not have diagnosis of osteoarthritis of the ankle.

## 2023-04-02 NOTE — Telephone Encounter (Signed)
PA request received from covermymeds for Diclofenac 1% gel.  PA submitted through covermymeds and waiting on reply.  CoverMyMeds Key:  [id number]

## 2023-04-12 ENCOUNTER — Encounter: Payer: Self-pay | Admitting: Podiatry

## 2023-04-12 ENCOUNTER — Ambulatory Visit: Payer: BC Managed Care – PPO | Admitting: Podiatry

## 2023-04-12 DIAGNOSIS — M7752 Other enthesopathy of left foot: Secondary | ICD-10-CM

## 2023-04-12 NOTE — Progress Notes (Signed)
Subjective: Chief Complaint  Patient presents with   Foot Pain    RM#12 Left ankle pain follow up patient states doing well pain scale 3 today.   62 year old female presents with above concerns.  She said that she was doing physical therapy on land and she thinks he overdid it.  She was seen in the office by Dr. Jamse Arn last appointment and while it is feeling "much better" she would like an injection on the left.    Denies any systemic complaints such as fevers, chills, nausea, vomiting. No acute changes since last appointment, and no other complaints at this time.   Objective: AAO x3, NAD DP/PT pulses palpable bilaterally, CRT less than 3 seconds On the left foot there is tenderness palpation of the sinus tarsi.  There is some trace edema to this.  There is no erythema or warmth.  There is no areas of pinpoint tenderness identified at this time. No pain with calf compression, swelling, warmth, erythema  Assessment: Capsulitis, left ankle  Plan: -All treatment options discussed with the patient including all alternatives, risks, complications.  -We discussed risks of another injection she understands this and she is well aware and wants to proceed.  Clean skin with Betadine, alcohol.  A mixture of 1 cc Kenalog 10, 0.5 cc of Marcaine plain, 0.5 cc of lidocaine plain stable treated at the sinus tarsi without complications.  Postinjection care discussed.  Tolerated well. -Continue PT- recommend more aquatic style -Will continue intermittent FMLA -Patient encouraged to call the office with any questions, concerns, change in symptoms.   Vivi Barrack DPM

## 2023-04-12 NOTE — Patient Instructions (Signed)

## 2023-05-15 ENCOUNTER — Telehealth: Payer: Self-pay | Admitting: Podiatry

## 2023-05-15 NOTE — Telephone Encounter (Signed)
 Received call from patient (05/08/2023) -- asked that her FMLA paperwork get extended for another year.  Per the last FMLA docs completed (04/20/22), patient has a chronic condition and is out of work on an intermittent basis estimating time out of work to be 2 times per week lasting 8 hours each episode.  The documents were amended from last year to this year and e-mailed to [employer] in HR [email or web address]> and patient cc'd [email or web address].  Called patient @ [phone] -- LMVM and advised same ....      J. Abbott -- 05/15/2023

## 2023-05-29 ENCOUNTER — Other Ambulatory Visit: Payer: Self-pay | Admitting: Endocrinology

## 2023-05-29 DIAGNOSIS — E041 Nontoxic single thyroid nodule: Secondary | ICD-10-CM

## 2023-06-21 ENCOUNTER — Ambulatory Visit: Payer: BC Managed Care – PPO | Admitting: Podiatry

## 2023-06-29 ENCOUNTER — Inpatient Hospital Stay: Admission: RE | Admit: 2023-06-29 | Payer: Self-pay | Source: Ambulatory Visit

## 2023-07-05 ENCOUNTER — Other Ambulatory Visit: Payer: Self-pay

## 2023-07-20 ENCOUNTER — Ambulatory Visit
Admission: RE | Admit: 2023-07-20 | Discharge: 2023-07-20 | Disposition: A | Discharge status: Home / Self Care | Payer: Self-pay | Source: Ambulatory Visit | Attending: Endocrinology | Admitting: Endocrinology

## 2023-07-20 DIAGNOSIS — E041 Nontoxic single thyroid nodule: Secondary | ICD-10-CM

## 2023-09-25 ENCOUNTER — Emergency Department (HOSPITAL_BASED_OUTPATIENT_CLINIC_OR_DEPARTMENT_OTHER)

## 2023-09-25 ENCOUNTER — Inpatient Hospital Stay (HOSPITAL_BASED_OUTPATIENT_CLINIC_OR_DEPARTMENT_OTHER)
Admission: EM | Admit: 2023-09-25 | Discharge: 2023-09-30 | DRG: 871 | Disposition: A | Discharge status: Home / Self Care | Source: Ambulatory Visit | Attending: Internal Medicine | Admitting: Internal Medicine

## 2023-09-25 ENCOUNTER — Inpatient Hospital Stay (HOSPITAL_COMMUNITY)

## 2023-09-25 ENCOUNTER — Other Ambulatory Visit: Payer: Self-pay

## 2023-09-25 ENCOUNTER — Encounter (HOSPITAL_BASED_OUTPATIENT_CLINIC_OR_DEPARTMENT_OTHER): Payer: Self-pay | Admitting: Emergency Medicine

## 2023-09-25 DIAGNOSIS — E876 Hypokalemia: Secondary | ICD-10-CM | POA: Diagnosis present

## 2023-09-25 DIAGNOSIS — A411 Sepsis due to other specified staphylococcus: Secondary | ICD-10-CM | POA: Diagnosis not present

## 2023-09-25 DIAGNOSIS — A419 Sepsis, unspecified organism: Secondary | ICD-10-CM | POA: Diagnosis present

## 2023-09-25 DIAGNOSIS — N179 Acute kidney failure, unspecified: Secondary | ICD-10-CM | POA: Diagnosis not present

## 2023-09-25 DIAGNOSIS — Z880 Allergy status to penicillin: Secondary | ICD-10-CM | POA: Diagnosis not present

## 2023-09-25 DIAGNOSIS — Z888 Allergy status to other drugs, medicaments and biological substances status: Secondary | ICD-10-CM | POA: Diagnosis not present

## 2023-09-25 DIAGNOSIS — J159 Unspecified bacterial pneumonia: Secondary | ICD-10-CM | POA: Diagnosis present

## 2023-09-25 DIAGNOSIS — R112 Nausea with vomiting, unspecified: Secondary | ICD-10-CM

## 2023-09-25 DIAGNOSIS — Z1152 Encounter for screening for COVID-19: Secondary | ICD-10-CM | POA: Diagnosis not present

## 2023-09-25 DIAGNOSIS — R791 Abnormal coagulation profile: Secondary | ICD-10-CM | POA: Diagnosis present

## 2023-09-25 DIAGNOSIS — J189 Pneumonia, unspecified organism: Secondary | ICD-10-CM

## 2023-09-25 DIAGNOSIS — E1165 Type 2 diabetes mellitus with hyperglycemia: Secondary | ICD-10-CM | POA: Diagnosis present

## 2023-09-25 DIAGNOSIS — E119 Type 2 diabetes mellitus without complications: Secondary | ICD-10-CM | POA: Diagnosis not present

## 2023-09-25 DIAGNOSIS — Z794 Long term (current) use of insulin: Secondary | ICD-10-CM

## 2023-09-25 DIAGNOSIS — N3001 Acute cystitis with hematuria: Secondary | ICD-10-CM | POA: Diagnosis present

## 2023-09-25 DIAGNOSIS — Z6841 Body Mass Index (BMI) 40.0 and over, adult: Secondary | ICD-10-CM

## 2023-09-25 DIAGNOSIS — I10 Essential (primary) hypertension: Secondary | ICD-10-CM | POA: Diagnosis present

## 2023-09-25 DIAGNOSIS — Z86711 Personal history of pulmonary embolism: Secondary | ICD-10-CM | POA: Diagnosis not present

## 2023-09-25 DIAGNOSIS — E785 Hyperlipidemia, unspecified: Secondary | ICD-10-CM | POA: Diagnosis present

## 2023-09-25 DIAGNOSIS — Z7901 Long term (current) use of anticoagulants: Secondary | ICD-10-CM | POA: Diagnosis not present

## 2023-09-25 DIAGNOSIS — Z79899 Other long term (current) drug therapy: Secondary | ICD-10-CM

## 2023-09-25 LAB — CBC WITH DIFFERENTIAL/PLATELET
Abs Immature Granulocytes: 0.15 10*3/uL — ABNORMAL HIGH (ref 0.00–0.07)
Basophils Absolute: 0.1 10*3/uL (ref 0.0–0.1)
Basophils Relative: 0 %
Eosinophils Absolute: 0 10*3/uL (ref 0.0–0.5)
Eosinophils Relative: 0 %
HCT: 38 % (ref 36.0–46.0)
Hemoglobin: 11.9 g/dL — ABNORMAL LOW (ref 12.0–15.0)
Immature Granulocytes: 1 %
Lymphocytes Relative: 6 %
Lymphs Abs: 1.1 10*3/uL (ref 0.7–4.0)
MCH: 27.1 pg (ref 26.0–34.0)
MCHC: 31.3 g/dL (ref 30.0–36.0)
MCV: 86.6 fL (ref 80.0–100.0)
Monocytes Absolute: 1.8 10*3/uL — ABNORMAL HIGH (ref 0.1–1.0)
Monocytes Relative: 10 %
Neutro Abs: 14.4 10*3/uL — ABNORMAL HIGH (ref 1.7–7.7)
Neutrophils Relative %: 83 %
Platelets: 306 10*3/uL (ref 150–400)
RBC: 4.39 MIL/uL (ref 3.87–5.11)
RDW: 15.6 % — ABNORMAL HIGH (ref 11.5–15.5)
WBC: 17.4 10*3/uL — ABNORMAL HIGH (ref 4.0–10.5)
nRBC: 0 % (ref 0.0–0.2)

## 2023-09-25 LAB — MAGNESIUM
Magnesium: 1.5 mg/dL — ABNORMAL LOW (ref 1.7–2.4)
Magnesium: 1.7 mg/dL (ref 1.7–2.4)

## 2023-09-25 LAB — COMPREHENSIVE METABOLIC PANEL WITH GFR
ALT: 22 U/L (ref 0–44)
AST: 36 U/L (ref 15–41)
Albumin: 3.9 g/dL (ref 3.5–5.0)
Alkaline Phosphatase: 78 U/L (ref 38–126)
Anion gap: 15 (ref 5–15)
BUN: 22 mg/dL (ref 8–23)
CO2: 28 mmol/L (ref 22–32)
Calcium: 9.8 mg/dL (ref 8.9–10.3)
Chloride: 93 mmol/L — ABNORMAL LOW (ref 98–111)
Creatinine, Ser: 1.24 mg/dL — ABNORMAL HIGH (ref 0.44–1.00)
GFR, Estimated: 49 mL/min — ABNORMAL LOW (ref >60)
Glucose, Bld: 191 mg/dL — ABNORMAL HIGH (ref 70–99)
Potassium: 3.7 mmol/L (ref 3.5–5.1)
Sodium: 136 mmol/L (ref 135–145)
Total Bilirubin: 0.9 mg/dL (ref 0.0–1.2)
Total Protein: 8.2 g/dL — ABNORMAL HIGH (ref 6.5–8.1)

## 2023-09-25 LAB — URINALYSIS, ROUTINE W REFLEX MICROSCOPIC
Bilirubin Urine: NEGATIVE
Glucose, UA: NEGATIVE mg/dL
Nitrite: NEGATIVE
Protein, ur: 100 mg/dL — AB
RBC / HPF: >50 RBC/hpf (ref 0–5)
Specific Gravity, Urine: 1.037 — ABNORMAL HIGH (ref 1.005–1.030)
Squamous Epithelial / HPF: >50 /HPF (ref 0–5)
pH: 6 (ref 5.0–8.0)

## 2023-09-25 LAB — LACTIC ACID, PLASMA
Lactic Acid, Venous: 1.6 mmol/L (ref 0.5–1.9)
Lactic Acid, Venous: 1.7 mmol/L (ref 0.5–1.9)

## 2023-09-25 LAB — LIPASE, BLOOD: Lipase: 58 U/L — ABNORMAL HIGH (ref 11–51)

## 2023-09-25 LAB — I-STAT VENOUS BLOOD GAS, ED
Acid-Base Excess: 4 mmol/L — ABNORMAL HIGH (ref 0.0–2.0)
Bicarbonate: 29.5 mmol/L — ABNORMAL HIGH (ref 20.0–28.0)
Calcium, Ion: 1.1 mmol/L — ABNORMAL LOW (ref 1.15–1.40)
HCT: 32 % — ABNORMAL LOW (ref 36.0–46.0)
Hemoglobin: 10.9 g/dL — ABNORMAL LOW (ref 12.0–15.0)
O2 Saturation: 52 %
Potassium: 3.7 mmol/L (ref 3.5–5.1)
Sodium: 134 mmol/L — ABNORMAL LOW (ref 135–145)
TCO2: 31 mmol/L (ref 22–32)
pCO2, Ven: 45.3 mmHg (ref 44–60)
pH, Ven: 7.422 (ref 7.25–7.43)
pO2, Ven: 27 mmHg — CL (ref 32–45)

## 2023-09-25 LAB — GLUCOSE, CAPILLARY: Glucose-Capillary: 140 mg/dL — ABNORMAL HIGH (ref 70–99)

## 2023-09-25 LAB — POTASSIUM: Potassium: 3.7 mmol/L (ref 3.5–5.1)

## 2023-09-25 LAB — PROTIME-INR
INR: 1.4 — ABNORMAL HIGH (ref 0.8–1.2)
Prothrombin Time: 17.1 s — ABNORMAL HIGH (ref 11.4–15.2)

## 2023-09-25 LAB — TROPONIN T, HIGH SENSITIVITY
Troponin T High Sensitivity: 34 ng/L — ABNORMAL HIGH (ref <19)
Troponin T High Sensitivity: 30 ng/L — ABNORMAL HIGH (ref <19)

## 2023-09-25 LAB — D-DIMER, QUANTITATIVE: D-Dimer, Quant: 0.67 ug{FEU}/mL — ABNORMAL HIGH (ref 0.00–0.50)

## 2023-09-25 MED ORDER — PREGABALIN 25 MG PO CAPS
50.0000 mg | ORAL_CAPSULE | Freq: Two times a day (BID) | ORAL | Status: DC
  Administered 2023-09-25 – 2023-09-30 (×10): 50 mg via ORAL
  Filled 2023-09-25 (×10): qty 2

## 2023-09-25 MED ORDER — DILTIAZEM HCL 25 MG/5ML IV SOLN
15.0000 mg | Freq: Once | INTRAVENOUS | Status: DC
  Filled 2023-09-25: qty 5

## 2023-09-25 MED ORDER — INSULIN ASPART 100 UNIT/ML IJ SOLN
0.0000 [IU] | Freq: Three times a day (TID) | INTRAMUSCULAR | Status: DC
  Administered 2023-09-26 (×3): 2 [IU] via SUBCUTANEOUS
  Administered 2023-09-27 – 2023-09-28 (×2): 1 [IU] via SUBCUTANEOUS
  Administered 2023-09-28 – 2023-09-29 (×2): 2 [IU] via SUBCUTANEOUS

## 2023-09-25 MED ORDER — ACETAMINOPHEN 325 MG PO TABS
650.0000 mg | ORAL_TABLET | Freq: Four times a day (QID) | ORAL | Status: DC | PRN
  Administered 2023-09-25 – 2023-09-26 (×2): 650 mg via ORAL
  Filled 2023-09-25 (×2): qty 2

## 2023-09-25 MED ORDER — INSULIN GLARGINE-YFGN 100 UNIT/ML ~~LOC~~ SOLN
75.0000 [IU] | Freq: Two times a day (BID) | SUBCUTANEOUS | Status: DC
Start: 2023-09-25 — End: 2023-09-30
  Administered 2023-09-25 – 2023-09-30 (×10): 75 [IU] via SUBCUTANEOUS
  Filled 2023-09-25 (×12): qty 0.75

## 2023-09-25 MED ORDER — WARFARIN SODIUM 10 MG PO TABS
10.0000 mg | ORAL_TABLET | Freq: Once | ORAL | Status: AC
  Administered 2023-09-26: 10 mg via ORAL
  Filled 2023-09-25 (×2): qty 1

## 2023-09-25 MED ORDER — SODIUM CHLORIDE 0.9 % IV SOLN
2.0000 g | INTRAVENOUS | Status: DC
  Administered 2023-09-26 – 2023-09-27 (×2): 2 g via INTRAVENOUS
  Filled 2023-09-25 (×2): qty 20

## 2023-09-25 MED ORDER — AMLODIPINE BESYLATE 10 MG PO TABS
10.0000 mg | ORAL_TABLET | Freq: Every day | ORAL | Status: DC
  Administered 2023-09-25 – 2023-09-30 (×6): 10 mg via ORAL
  Filled 2023-09-25 (×6): qty 1

## 2023-09-25 MED ORDER — LACTATED RINGERS IV SOLN: INTRAVENOUS | Status: DC

## 2023-09-25 MED ORDER — FOLIC ACID 1 MG PO TABS
1.0000 mg | ORAL_TABLET | Freq: Every day | ORAL | Status: DC
  Administered 2023-09-25 – 2023-09-30 (×6): 1 mg via ORAL
  Filled 2023-09-25 (×6): qty 1

## 2023-09-25 MED ORDER — WARFARIN - PHARMACIST DOSING INPATIENT: Freq: Every day | Status: DC

## 2023-09-25 MED ORDER — WARFARIN SODIUM 5 MG PO TABS: 5.0000 mg | ORAL_TABLET | Freq: Every day | ORAL | Status: DC

## 2023-09-25 MED ORDER — SODIUM CHLORIDE 0.9 % IV SOLN
1.0000 g | Freq: Once | INTRAVENOUS | Status: AC
  Administered 2023-09-25: 1 g via INTRAVENOUS
  Filled 2023-09-25: qty 10

## 2023-09-25 MED ORDER — SODIUM CHLORIDE 0.9 % IV BOLUS
1000.0000 mL | Freq: Once | INTRAVENOUS | Status: AC
  Administered 2023-09-25: 1000 mL via INTRAVENOUS

## 2023-09-25 MED ORDER — INSULIN DETEMIR 100 UNIT/ML FLEXPEN: 75.0000 [IU] | PEN_INJECTOR | Freq: Two times a day (BID) | SUBCUTANEOUS | Status: DC

## 2023-09-25 MED ORDER — MAGNESIUM SULFATE 2 GM/50ML IV SOLN
2.0000 g | Freq: Once | INTRAVENOUS | Status: AC
  Administered 2023-09-25: 2 g via INTRAVENOUS
  Filled 2023-09-25: qty 50

## 2023-09-25 MED ORDER — SODIUM CHLORIDE 0.9 % IV SOLN
500.0000 mg | INTRAVENOUS | Status: DC
  Administered 2023-09-26 – 2023-09-27 (×2): 500 mg via INTRAVENOUS
  Filled 2023-09-25 (×2): qty 5

## 2023-09-25 MED ORDER — ONDANSETRON HCL 4 MG/2ML IJ SOLN
4.0000 mg | Freq: Once | INTRAMUSCULAR | Status: AC
  Administered 2023-09-25: 4 mg via INTRAVENOUS
  Filled 2023-09-25: qty 2

## 2023-09-25 MED ORDER — ENOXAPARIN SODIUM 300 MG/3ML IJ SOLN
165.0000 mg | Freq: Once | INTRAMUSCULAR | Status: AC
  Administered 2023-09-26: 165 mg via SUBCUTANEOUS
  Filled 2023-09-25: qty 1.65

## 2023-09-25 MED ORDER — SODIUM CHLORIDE 0.9 % IV SOLN
500.0000 mg | Freq: Once | INTRAVENOUS | Status: AC
  Administered 2023-09-25: 500 mg via INTRAVENOUS
  Filled 2023-09-25: qty 5

## 2023-09-25 MED ORDER — IOHEXOL 300 MG/ML  SOLN
100.0000 mL | Freq: Once | INTRAMUSCULAR | Status: AC | PRN
  Administered 2023-09-25: 100 mL via INTRAVENOUS

## 2023-09-25 MED ORDER — IOHEXOL 350 MG/ML SOLN: 100.0000 mL | Freq: Once | INTRAVENOUS | Status: DC | PRN

## 2023-09-25 MED ORDER — WARFARIN - PHYSICIAN DOSING INPATIENT: Freq: Every day | Status: DC

## 2023-09-25 MED ORDER — IBUPROFEN 200 MG PO TABS
400.0000 mg | ORAL_TABLET | Freq: Once | ORAL | Status: AC
  Administered 2023-09-25: 400 mg via ORAL
  Filled 2023-09-25: qty 2

## 2023-09-25 NOTE — ED Notes (Signed)
 Report given to [facility].

## 2023-09-25 NOTE — ED Notes (Signed)
 Report given to the Floor RN.Marland KitchenMarland Kitchen

## 2023-09-25 NOTE — ED Notes (Signed)
 Heart Rhythm  changed... Provider informed... New EKG performed... All meds cleared with the Provider.Aaron AasAaron Aas

## 2023-09-25 NOTE — ED Triage Notes (Signed)
 N/v/d x 2 days. Was seen by PCP, was "falling asleep on him so they sent me over here"  Son [place] has to leave but emergency contact in epic if needed

## 2023-09-25 NOTE — ED Notes (Signed)
 Unable to get second IV/site for Blood culture.Natasha AasAaron Rollins

## 2023-09-25 NOTE — ED Notes (Signed)
 I stat performed by RT.

## 2023-09-25 NOTE — ED Notes (Signed)
 2nd IV if LA is elevated per EDP [place]

## 2023-09-25 NOTE — H&P (Signed)
 History and Physical    Patient: Natasha Rollins XBM:[id number] DOB: [birth year] DOA: 09/25/2023 DOS: the patient was seen and examined on 09/25/2023 PCP: Olin Bertin, MD  Patient coming from: Home  Chief Complaint:  Chief Complaint  Patient presents with   Emesis   HPI: Natasha Rollins is a 63 y.o. female with medical history significant for history of PE on Coumadin , type 2 diabetes mellitus, morbid obesity, hidradenitis suppurativa, and morbid obesity presented to her primary care office today because the patient's had trouble with nausea for 2 days and just dizziness and not felt well.  She only vomited a little but she has been very nauseated.  Morning she started having squirts of diarrhea.  She denies any shortness of breath or fevers or chills.  At her primary physician's office she was lethargic.  She had difficulty staying awake to complete her evaluation so they sent her into the ER.  In the emergency department she was found to have a low-grade fever.  Because of her complaints of nausea and diarrhea and some vomiting a CT scan of her abdomen pelvis was done.  That revealed a left upper lobe infiltrate.  The patient had persistent tachycardia so she was accepted for admission from [facility] for IV antibiotics. Upon arrival on the floor her temperature increased despite getting Tylenol .  Her heart rate remained in the 150s.  She will be transferred from medical bed to a progressive bed with a new diagnosis of sepsis. Additional IV fluids also will be given   Review of Systems: As mentioned in the history of present illness. All other systems reviewed and are negative. Past Medical History:  Diagnosis Date   Diabetes mellitus without complication (HCC)    Fat necrosis 05/28/2018   Hidradenitis suppurativa 05/28/2018   Hypertension    Morbid obesity (HCC)    Right second toe ulcer (HCC) 05/28/2018   Past Surgical History:  Procedure Laterality Date   CESAREAN SECTION     x2    CHOLECYSTECTOMY     COLONOSCOPY WITH PROPOFOL  N/A 04/06/2022   Procedure: COLONOSCOPY WITH PROPOFOL ;  Surgeon: Daina Drum, MD;  Location: [facility];  Service: Gastroenterology;  Laterality: N/A;   HERNIA REPAIR     POLYPECTOMY  04/06/2022   Procedure: POLYPECTOMY;  Surgeon: Daina Drum, MD;  Location: [facility];  Service: Gastroenterology;;   TONSILLECTOMY     Social History:  reports that she has never smoked. She has never used smokeless tobacco. She reports that she does not drink alcohol and does not use drugs.  Allergies  Allergen Reactions   Vitamin D     Other Reaction(s): Rash on face   Penicillins Itching and Rash   Saxagliptin Rash    Family History  Problem Relation Age of Onset   Colon cancer Neg Hx    Stomach cancer Neg Hx    Esophageal cancer Neg Hx    Colon polyps Neg Hx     Prior to Admission medications   Medication Sig Start Date End Date Taking? Authorizing Provider  amLODipine  (NORVASC ) 10 MG tablet Take 10 mg by mouth daily. 06/07/14  Yes [provider]  folic acid  (FOLVITE ) 1 MG tablet Take 1 mg by mouth daily.   Yes [provider]  LEVEMIR  FLEXTOUCH 100 UNIT/ML Pen Inject 75 Units into the skin 2 (two) times daily.  07/28/14  Yes [provider]  losartan-hydrochlorothiazide  (HYZAAR) 100-25 MG tablet Take 1 tablet by mouth daily. 12/20/17  Yes [provider]  NOVOLOG  FLEXPEN 100 UNIT/ML FlexPen Inject 30 Units into the skin 3 (three) times daily with meals.  07/28/14  Yes [provider]  pregabalin (LYRICA) 50 MG capsule pregabalin 50 mg capsule  TAKE 1 CAPSULE BY MOUTH TWICE A DAY   Yes [provider]  rosuvastatin (CRESTOR) 10 MG tablet rosuvastatin 10 mg tablet  TAKE 1 TABLET BY MOUTH FOR 3 DAYS A WEEK 10/13/20  Yes [provider]  Vitamin D, Ergocalciferol, (DRISDOL) 1.25 MG (50000 UNIT) CAPS capsule Take 50,000 Units by mouth once a week. 03/22/22  Yes [provider]   warfarin (COUMADIN ) 5 MG tablet Take 1 tablet (5 mg total) by mouth daily at 6 PM. 09/11/16 09/25/23 Yes Benjiman Bras, MD  allopurinol  (ZYLOPRIM ) 100 MG tablet Take 1 tablet (100 mg total) by mouth daily. Patient not taking: Reported on 09/25/2023 09/05/19   Wagoner, Matthew R, DPM  azithromycin  (ZITHROMAX ) 250 MG tablet Take 1 tablet (250 mg total) by mouth daily. Take first 2 tablets together, then 1 every day until finished. Patient not taking: Reported on 09/25/2023 07/04/22   Rosealee Concha, MD  B-D UF III MINI PEN NEEDLES 31G X 5 MM MISC USE AS DIRECTED 5 TIMES A DAY 12/20/18   [provider]  benzonatate  (TESSALON ) 100 MG capsule Take 1 capsule (100 mg total) by mouth every 8 (eight) hours. Patient not taking: Reported on 09/25/2023 06/23/22   Lyna Sandhoff, PA-C  bisacodyl  (DULCOLAX) 5 MG EC tablet Take 4 tablets at 3:00 pm on 04/05/22 Patient not taking: Reported on 09/25/2023 03/23/22   Daina Drum, MD  Blood Glucose Monitoring Suppl (FREESTYLE FREEDOM LITE) w/Device KIT See admin instructions. 07/03/19   [provider]  colchicine  0.6 MG tablet Take 1 tablet (0.6 mg total) by mouth daily. Patient not taking: Reported on 09/25/2023 07/04/19   Charity Conch, DPM  gabapentin  (NEURONTIN ) 300 MG capsule Take 1 capsule (300 mg total) by mouth at bedtime. Patient not taking: Reported on 09/25/2023 10/27/19   Charity Conch, DPM  Lancets [facility] DELICA PLUS [place]) MISC USE AS DIRECTED ONCE A DAY. 06/17/18   [provider]  [facility] VERIO test strip  11/26/18   [provider]  polyethylene glycol (COLYTE  WITH FLAVOR PACKS) 240 g solution Take as a split dose following doctor instructions Patient not taking: Reported on 09/25/2023 03/23/22   Daina Drum, MD    Physical Exam: Vitals:   09/25/23 1849 09/25/23 1956 09/25/23 2008 09/25/23 2149  BP: (!) 155/53  118/62 116/63  Pulse: (!) 105  (!) 150 (!) 160  Resp: 16  16   Temp: (!) 100.9 F  (38.3 C)  (!) 100.5 F (38.1 C) (!) 100.4 F (38 C)  TempSrc: Oral     SpO2: 97%  100% 96%  Weight:  (!) 161.9 kg    Height:  5\' 4"  (1.626 m)     Physical Exam:  General: No acute distress, obese HEENT: Normocephalic, atraumatic, PERRL Cardiovascular: Normal rate and rhythm. Distal pulses intact. Pulmonary: Normal pulmonary effort, normal breath sounds Gastrointestinal: Nondistended abdomen, soft, non-tender, normoactive bowel sounds, no organomegaly Musculoskeletal:Normal ROM, no lower ext edema Lymphadenopathy: No cervical LAD. Skin: Skin is warm and dry. Neuro: No focal deficits noted, AAOx3. PSYCH: Attentive and cooperative  Data Reviewed:  Results for orders placed or performed during the hospital encounter of 09/25/23 (from the past 24 hours)  CBC with Differential     Status: Abnormal   Collection  Time: 09/25/23  1:17 PM  Result Value Ref Range   WBC 17.4 (H) 4.0 - 10.5 K/uL   RBC 4.39 3.87 - 5.11 MIL/uL   Hemoglobin 11.9 (L) 12.0 - 15.0 g/dL   HCT 16.1 09.6 - 04.5 %   MCV 86.6 80.0 - 100.0 fL   MCH 27.1 26.0 - 34.0 pg   MCHC 31.3 30.0 - 36.0 g/dL   RDW 40.9 (H) 81.1 - 91.4 %   Platelets 306 150 - 400 K/uL   nRBC 0.0 0.0 - 0.2 %   Neutrophils Relative % 83 %   Neutro Abs 14.4 (H) 1.7 - 7.7 K/uL   Lymphocytes Relative 6 %   Lymphs Abs 1.1 0.7 - 4.0 K/uL   Monocytes Relative 10 %   Monocytes Absolute 1.8 (H) 0.1 - 1.0 K/uL   Eosinophils Relative 0 %   Eosinophils Absolute 0.0 0.0 - 0.5 K/uL   Basophils Relative 0 %   Basophils Absolute 0.1 0.0 - 0.1 K/uL   Immature Granulocytes 1 %   Abs Immature Granulocytes 0.15 (H) 0.00 - 0.07 K/uL  Comprehensive metabolic panel     Status: Abnormal   Collection Time: 09/25/23  1:17 PM  Result Value Ref Range   Sodium 136 135 - 145 mmol/L   Potassium 3.7 3.5 - 5.1 mmol/L   Chloride 93 (L) 98 - 111 mmol/L   CO2 28 22 - 32 mmol/L   Glucose, Bld 191 (H) 70 - 99 mg/dL   BUN 22 8 - 23 mg/dL   Creatinine, Ser 7.82 (H) 0.44  - 1.00 mg/dL   Calcium 9.8 8.9 - 95.6 mg/dL   Total Protein 8.2 (H) 6.5 - 8.1 g/dL   Albumin 3.9 3.5 - 5.0 g/dL   AST 36 15 - 41 U/L   ALT 22 0 - 44 U/L   Alkaline Phosphatase 78 38 - 126 U/L   Total Bilirubin 0.9 0.0 - 1.2 mg/dL   GFR, Estimated 49 (L) >60 mL/min   Anion gap 15 5 - 15  Lipase, blood     Status: Abnormal   Collection Time: 09/25/23  1:17 PM  Result Value Ref Range   Lipase 58 (H) 11 - 51 U/L  Troponin T, High Sensitivity     Status: Abnormal   Collection Time: 09/25/23  1:17 PM  Result Value Ref Range   Troponin T High Sensitivity 34 (H) <19 ng/L  Magnesium     Status: Abnormal   Collection Time: 09/25/23  1:17 PM  Result Value Ref Range   Magnesium 1.5 (L) 1.7 - 2.4 mg/dL  Lactic acid, plasma     Status: None   Collection Time: 09/25/23  1:26 PM  Result Value Ref Range   Lactic Acid, Venous 1.6 0.5 - 1.9 mmol/L  I-Stat venous blood gas, ([facility], [facility], [facility])     Status: Abnormal   Collection Time: 09/25/23  2:11 PM  Result Value Ref Range   pH, Ven 7.422 7.25 - 7.43   pCO2, Ven 45.3 44 - 60 mmHg   pO2, Ven 27 (LL) 32 - 45 mmHg   Bicarbonate 29.5 (H) 20.0 - 28.0 mmol/L   TCO2 31 22 - 32 mmol/L   O2 Saturation 52 %   Acid-Base Excess 4.0 (H) 0.0 - 2.0 mmol/L   Sodium 134 (L) 135 - 145 mmol/L   Potassium 3.7 3.5 - 5.1 mmol/L   Calcium, Ion 1.10 (L) 1.15 - 1.40 mmol/L   HCT 32.0 (L) 36.0 - 46.0 %  Hemoglobin 10.9 (L) 12.0 - 15.0 g/dL   Collection site IV start    Drawn by Nurse    Sample type VENOUS    Comment NOTIFIED PHYSICIAN   Troponin T, High Sensitivity     Status: Abnormal   Collection Time: 09/25/23  2:30 PM  Result Value Ref Range   Troponin T High Sensitivity 30 (H) <19 ng/L  Lactic acid, plasma     Status: None   Collection Time: 09/25/23  3:26 PM  Result Value Ref Range   Lactic Acid, Venous 1.7 0.5 - 1.9 mmol/L  Urinalysis, Routine w reflex microscopic -Urine, Clean Catch     Status: Abnormal   Collection Time: 09/25/23  3:45 PM  Result  Value Ref Range   Color, Urine YELLOW YELLOW   APPearance CLOUDY (A) CLEAR   Specific Gravity, Urine 1.037 (H) 1.005 - 1.030   pH 6.0 5.0 - 8.0   Glucose, UA NEGATIVE NEGATIVE mg/dL   Hgb urine dipstick MODERATE (A) NEGATIVE   Bilirubin Urine NEGATIVE NEGATIVE   Ketones, ur TRACE (A) NEGATIVE mg/dL   Protein, ur 425 (A) NEGATIVE mg/dL   Nitrite NEGATIVE NEGATIVE   Leukocytes,Ua LARGE (A) NEGATIVE   RBC / HPF >50 0 - 5 RBC/hpf   WBC, UA 21-50 0 - 5 WBC/hpf   Bacteria, UA MANY (A) NONE SEEN   Squamous Epithelial / HPF >50 0 - 5 /HPF   Mucus PRESENT   Protime-INR     Status: Abnormal   Collection Time: 09/25/23  8:10 PM  Result Value Ref Range   Prothrombin Time 17.1 (H) 11.4 - 15.2 seconds   INR 1.4 (H) 0.8 - 1.2  Magnesium     Status: None   Collection Time: 09/25/23  8:10 PM  Result Value Ref Range   Magnesium 1.7 1.7 - 2.4 mg/dL  Potassium     Status: None   Collection Time: 09/25/23  8:10 PM  Result Value Ref Range   Potassium 3.7 3.5 - 5.1 mmol/L  Glucose, capillary     Status: Abnormal   Collection Time: 09/25/23  8:24 PM  Result Value Ref Range   Glucose-Capillary 140 (H) 70 - 99 mg/dL     Assessment and Plan: Sinus tachycardia  Sepsis Pneumonia Subtherapeutic INR, h/o PE Morbid obesity AKI  - IV fluids as tachycardia may be secondary to sepsis - Lovenox  x 1 tonight since INR is only 1.4 - Pharmacy to manage Coumadin  - Stat CTA r/o PE.  The tachycardia raises concern for PE, but my clinical suspicion for PE is still only moderate because her RR = 15 and she denies SOB. Sats are 98% on 2L. - Rocephin and Zithromax   # DMT2 -corrective dose insulin  Will hold her oral agents at this time  #hypomagnesemia - Replete   Advance Care Planning:   Code Status: Prior  The patient names her son [place] rather than her significant other as her surrogate decision maker and she wants to be full code.  Consults: none  Family Communication: none  Severity of  Illness: The appropriate patient status for this patient is INPATIENT. Inpatient status is judged to be reasonable and necessary in order to provide the required intensity of service to ensure the patient's safety. The patient's presenting symptoms, physical exam findings, and initial radiographic and laboratory data in the context of their chronic comorbidities is felt to place them at high risk for further clinical deterioration. Furthermore, it is not anticipated that the patient will be medically  stable for discharge from the hospital within 2 midnights of admission.   * I certify that at the point of admission it is my clinical judgment that the patient will require inpatient hospital care spanning beyond 2 midnights from the point of admission due to high intensity of service, high risk for further deterioration and high frequency of surveillance required.*  Author: Willadean Hark, MD 09/25/2023 10:01 PM  For on call review [email or web address].

## 2023-09-25 NOTE — ED Notes (Signed)
 Pt aware of the need for a urine... Unable to currently provide the sample.Marland KitchenMarland Kitchen

## 2023-09-25 NOTE — ED Provider Notes (Signed)
 [place] EMERGENCY DEPARTMENT AT [facility] Provider Note   CSN: [id number] Arrival date & time: 09/25/23  1247     History  Chief Complaint  Patient presents with   Emesis    Natasha Rollins is a 63 y.o. female.  Pt is a 63 yo female with pmhx significant for htn, dm, morbid obesity, PE (on couamdin) and hld.  Pt said she's had fevers and n/v/d for 2 days.  She also has abd pain.  She initially went to her pcp's office, but she kept falling asleep, so she was sent here for further eval.         Home Medications Prior to Admission medications   Medication Sig Start Date End Date Taking? Authorizing Provider  amLODipine  (NORVASC ) 10 MG tablet Take 10 mg by mouth daily. 06/07/14  Yes [provider]  folic acid  (FOLVITE ) 1 MG tablet Take 1 mg by mouth daily.   Yes [provider]  LEVEMIR  FLEXTOUCH 100 UNIT/ML Pen Inject 75 Units into the skin 2 (two) times daily.  07/28/14  Yes [provider]  losartan-hydrochlorothiazide  (HYZAAR) 100-25 MG tablet Take 1 tablet by mouth daily. 12/20/17  Yes [provider]  NOVOLOG  FLEXPEN 100 UNIT/ML FlexPen Inject 30 Units into the skin 3 (three) times daily with meals.  07/28/14  Yes [provider]  pregabalin (LYRICA) 50 MG capsule pregabalin 50 mg capsule  TAKE 1 CAPSULE BY MOUTH TWICE A DAY   Yes [provider]  rosuvastatin (CRESTOR) 10 MG tablet rosuvastatin 10 mg tablet  TAKE 1 TABLET BY MOUTH FOR 3 DAYS A WEEK 10/13/20  Yes [provider]  Vitamin D, Ergocalciferol, (DRISDOL) 1.25 MG (50000 UNIT) CAPS capsule Take 50,000 Units by mouth once a week. 03/22/22  Yes [provider]  warfarin (COUMADIN ) 5 MG tablet Take 1 tablet (5 mg total) by mouth daily at 6 PM. 09/11/16 09/25/23 Yes Benjiman Bras, MD  allopurinol  (ZYLOPRIM ) 100 MG tablet Take 1 tablet (100 mg total) by mouth daily. Patient not taking: Reported on 09/25/2023 09/05/19   Charity Conch,  DPM  azithromycin  (ZITHROMAX ) 250 MG tablet Take 1 tablet (250 mg total) by mouth daily. Take first 2 tablets together, then 1 every day until finished. Patient not taking: Reported on 09/25/2023 07/04/22   Rosealee Concha, MD  B-D UF III MINI PEN NEEDLES 31G X 5 MM MISC USE AS DIRECTED 5 TIMES A DAY 12/20/18   [provider]  benzonatate  (TESSALON ) 100 MG capsule Take 1 capsule (100 mg total) by mouth every 8 (eight) hours. Patient not taking: Reported on 09/25/2023 06/23/22   Lyna Sandhoff, PA-C  bisacodyl  (DULCOLAX) 5 MG EC tablet Take 4 tablets at 3:00 pm on 04/05/22 Patient not taking: Reported on 09/25/2023 03/23/22   Daina Drum, MD  Blood Glucose Monitoring Suppl (FREESTYLE FREEDOM LITE) w/Device KIT See admin instructions. 07/03/19   [provider]  colchicine  0.6 MG tablet Take 1 tablet (0.6 mg total) by mouth daily. Patient not taking: Reported on 09/25/2023 07/04/19   Charity Conch, DPM  gabapentin  (NEURONTIN ) 300 MG capsule Take 1 capsule (300 mg total) by mouth at bedtime. Patient not taking: Reported on 09/25/2023 10/27/19   Charity Conch, DPM  Lancets [facility] DELICA PLUS [place]) MISC USE AS DIRECTED ONCE A DAY. 06/17/18   [provider]  [facility] VERIO test strip  11/26/18   [provider]  polyethylene glycol (COLYTE  WITH FLAVOR PACKS) 240 g solution Take  as a split dose following doctor instructions Patient not taking: Reported on 09/25/2023 03/23/22   Daina Drum, MD      Allergies    Vitamin d, Penicillins, and Saxagliptin    Review of Systems   Review of Systems  Gastrointestinal:  Positive for abdominal pain, diarrhea, nausea and vomiting.  All other systems reviewed and are negative.   Physical Exam Updated Vital Signs BP (!) 143/69 (BP Location: Right Arm)   Pulse 88   Temp 98.7 F (37.1 C) (Oral)   Resp 20   Ht 5\' 4"  (1.626 m)   Wt (!) 161.9 kg   SpO2 90%   BMI 61.27 kg/m  Physical Exam Vitals and nursing  note reviewed.  Constitutional:      Appearance: Normal appearance. She is obese. She is ill-appearing.  HENT:     Head: Normocephalic and atraumatic.     Right Ear: External ear normal.     Left Ear: External ear normal.     Nose: Nose normal.     Mouth/Throat:     Mouth: Mucous membranes are dry.  Eyes:     Extraocular Movements: Extraocular movements intact.     Conjunctiva/sclera: Conjunctivae normal.     Pupils: Pupils are equal, round, and reactive to light.  Cardiovascular:     Rate and Rhythm: Regular rhythm. Tachycardia present.     Pulses: Normal pulses.     Heart sounds: Normal heart sounds.  Pulmonary:     Effort: Pulmonary effort is normal.     Breath sounds: Normal breath sounds.  Abdominal:     General: Abdomen is flat. Bowel sounds are normal.     Palpations: Abdomen is soft.  Musculoskeletal:        General: Normal range of motion.     Cervical back: Normal range of motion and neck supple.  Skin:    General: Skin is warm.     Capillary Refill: Capillary refill takes less than 2 seconds.  Neurological:     General: No focal deficit present.     Mental Status: She is alert and oriented to person, place, and time.  Psychiatric:        Mood and Affect: Mood normal.        Behavior: Behavior normal.     ED Results / Procedures / Treatments   Labs (all labs ordered are listed, but only abnormal results are displayed) Labs Reviewed  CULTURE, BLOOD (ROUTINE X 2) - Abnormal; Notable for the following components:      Result Value   Culture   (*)    Value: STAPHYLOCOCCUS CAPITIS THE SIGNIFICANCE OF ISOLATING THIS ORGANISM FROM A SINGLE VENIPUNCTURE CANNOT BE PREDICTED WITHOUT FURTHER CLINICAL AND CULTURE CORRELATION. SUSCEPTIBILITIES AVAILABLE ONLY ON REQUEST. Performed at [facility], [address] [place], [place] [address]    All other components within normal limits  BLOOD CULTURE ID PANEL (REFLEXED) - BCID2 - Abnormal; Notable for the following  components:   Staphylococcus species DETECTED (*)    All other components within normal limits  CBC WITH DIFFERENTIAL/PLATELET - Abnormal; Notable for the following components:   WBC 17.4 (*)    Hemoglobin 11.9 (*)    RDW 15.6 (*)    Neutro Abs 14.4 (*)    Monocytes Absolute 1.8 (*)    Abs Immature Granulocytes 0.15 (*)    All other components within normal limits  COMPREHENSIVE METABOLIC PANEL WITH GFR - Abnormal; Notable for the following components:  Chloride 93 (*)    Glucose, Bld 191 (*)    Creatinine, Ser 1.24 (*)    Total Protein 8.2 (*)    GFR, Estimated 49 (*)    All other components within normal limits  LIPASE, BLOOD - Abnormal; Notable for the following components:   Lipase 58 (*)    All other components within normal limits  URINALYSIS, ROUTINE W REFLEX MICROSCOPIC - Abnormal; Notable for the following components:   APPearance CLOUDY (*)    Specific Gravity, Urine 1.037 (*)    Hgb urine dipstick MODERATE (*)    Ketones, ur TRACE (*)    Protein, ur 100 (*)    Leukocytes,Ua LARGE (*)    Bacteria, UA MANY (*)    All other components within normal limits  MAGNESIUM - Abnormal; Notable for the following components:   Magnesium 1.5 (*)    All other components within normal limits  HEMOGLOBIN A1C - Abnormal; Notable for the following components:   Hgb A1c MFr Bld 6.4 (*)    All other components within normal limits  PROTIME-INR - Abnormal; Notable for the following components:   Prothrombin Time 19.4 (*)    INR 1.6 (*)    All other components within normal limits  PROTIME-INR - Abnormal; Notable for the following components:   Prothrombin Time 17.1 (*)    INR 1.4 (*)    All other components within normal limits  GLUCOSE, CAPILLARY - Abnormal; Notable for the following components:   Glucose-Capillary 140 (*)    All other components within normal limits  D-DIMER, QUANTITATIVE - Abnormal; Notable for the following components:   D-Dimer, Quant 0.67 (*)    All other  components within normal limits  GLUCOSE, CAPILLARY - Abnormal; Notable for the following components:   Glucose-Capillary 167 (*)    All other components within normal limits  CBC - Abnormal; Notable for the following components:   WBC 13.4 (*)    Hemoglobin 10.7 (*)    HCT 33.7 (*)    RDW 15.6 (*)    All other components within normal limits  BASIC METABOLIC PANEL WITH GFR - Abnormal; Notable for the following components:   Glucose, Bld 195 (*)    Creatinine, Ser 1.20 (*)    Calcium 8.0 (*)    GFR, Estimated 51 (*)    All other components within normal limits  GLUCOSE, CAPILLARY - Abnormal; Notable for the following components:   Glucose-Capillary 161 (*)    All other components within normal limits  GLUCOSE, CAPILLARY - Abnormal; Notable for the following components:   Glucose-Capillary 175 (*)    All other components within normal limits  PROTIME-INR - Abnormal; Notable for the following components:   Prothrombin Time 21.7 (*)    INR 1.9 (*)    All other components within normal limits  CBC WITH DIFFERENTIAL/PLATELET - Abnormal; Notable for the following components:   WBC 15.4 (*)    RBC 3.66 (*)    Hemoglobin 10.0 (*)    HCT 32.1 (*)    Neutro Abs 11.0 (*)    Monocytes Absolute 2.2 (*)    Abs Immature Granulocytes 0.19 (*)    All other components within normal limits  GLUCOSE, CAPILLARY - Abnormal; Notable for the following components:   Glucose-Capillary 227 (*)    All other components within normal limits  GLUCOSE, CAPILLARY - Abnormal; Notable for the following components:   Glucose-Capillary 113 (*)    All other components within normal limits  BASIC  METABOLIC PANEL WITH GFR - Abnormal; Notable for the following components:   Potassium 3.2 (*)    Glucose, Bld 172 (*)    Creatinine, Ser 1.17 (*)    Calcium 7.9 (*)    GFR, Estimated 52 (*)    All other components within normal limits  GLUCOSE, CAPILLARY - Abnormal; Notable for the following components:    Glucose-Capillary 175 (*)    All other components within normal limits  GLUCOSE, CAPILLARY - Abnormal; Notable for the following components:   Glucose-Capillary 142 (*)    All other components within normal limits  PROTIME-INR - Abnormal; Notable for the following components:   Prothrombin Time 22.2 (*)    INR 1.9 (*)    All other components within normal limits  CBC - Abnormal; Notable for the following components:   WBC 13.1 (*)    RBC 3.57 (*)    Hemoglobin 9.8 (*)    HCT 30.5 (*)    All other components within normal limits  BASIC METABOLIC PANEL WITH GFR - Abnormal; Notable for the following components:   Glucose, Bld 57 (*)    Creatinine, Ser 1.07 (*)    Calcium 8.2 (*)    GFR, Estimated 58 (*)    All other components within normal limits  GLUCOSE, CAPILLARY - Abnormal; Notable for the following components:   Glucose-Capillary 202 (*)    All other components within normal limits  GLUCOSE, CAPILLARY - Abnormal; Notable for the following components:   Glucose-Capillary 102 (*)    All other components within normal limits  I-STAT VENOUS BLOOD GAS, ED - Abnormal; Notable for the following components:   pO2, Ven 27 (*)    Bicarbonate 29.5 (*)    Acid-Base Excess 4.0 (*)    Sodium 134 (*)    Calcium, Ion 1.10 (*)    HCT 32.0 (*)    Hemoglobin 10.9 (*)    All other components within normal limits  TROPONIN T, HIGH SENSITIVITY - Abnormal; Notable for the following components:   Troponin T High Sensitivity 34 (*)    All other components within normal limits  TROPONIN T, HIGH SENSITIVITY - Abnormal; Notable for the following components:   Troponin T High Sensitivity 30 (*)    All other components within normal limits  RESP PANEL BY RT-PCR (RSV, FLU A&B, COVID)  RVPGX2  EXPECTORATED SPUTUM ASSESSMENT W GRAM STAIN, RFLX TO RESP C  LACTIC ACID, PLASMA  LACTIC ACID, PLASMA  MAGNESIUM  POTASSIUM  LACTIC ACID, PLASMA  LACTIC ACID, PLASMA  MAGNESIUM  PROCALCITONIN  LEGIONELLA  PNEUMOPHILA SEROGP 1 UR AG  STREP PNEUMONIAE URINARY ANTIGEN    EKG EKG Interpretation Date/Time:  Tuesday Sep 25 2023 14:17:23 EDT Ventricular Rate:  106 PR Interval:  174 QRS Duration:  91 QT Interval:  328 QTC Calculation: 436 R Axis:   -3  Text Interpretation: Sinus tachycardia Low voltage, precordial leads Confirmed by Lowery Rue [phone]) on 09/25/2023 3:04:42 PM  Radiology No results found.   Procedures Procedures    Medications Ordered in ED Medications  folic acid  (FOLVITE ) tablet 1 mg (1 mg Oral Given 09/27/23 0959)  pregabalin (LYRICA) capsule 50 mg (50 mg Oral Given 09/27/23 2135)  amLODipine  (NORVASC ) tablet 10 mg (10 mg Oral Given 09/27/23 0959)  insulin  aspart (novoLOG ) injection 0-9 Units (1 Units Subcutaneous Given 09/27/23 1805)  insulin  glargine-yfgn (SEMGLEE) injection 75 Units (75 Units Subcutaneous Given 09/27/23 2135)  Warfarin - Pharmacist Dosing Inpatient ( Does not apply Given 09/27/23 1806)  iohexol  (OMNIPAQUE ) 350 MG/ML injection 100 mL (has no administration in time range)  acetaminophen  (TYLENOL ) tablet 650 mg (650 mg Oral Given 09/27/23 1809)  sodium chloride  flush (NS) 0.9 % injection 10-40 mL (10 mLs Intracatheter Given 09/27/23 2137)  sodium chloride  flush (NS) 0.9 % injection 10-40 mL (has no administration in time range)  0.9 %  sodium chloride  infusion (100 mL/hr Intravenous Rate/Dose Change 09/27/23 1034)  warfarin (COUMADIN ) tablet 5 mg (5 mg Oral Given 09/27/23 1428)  potassium chloride SA (KLOR-CON M) CR tablet 40 mEq (40 mEq Oral Given 09/27/23 2135)  cefTRIAXone (ROCEPHIN) 2 g in sodium chloride  0.9 % 100 mL IVPB (2 g Intravenous New Bag/Given 09/28/23 0633)  sodium chloride  0.9 % bolus 1,000 mL (0 mLs Intravenous Stopped 09/25/23 1548)  ondansetron  (ZOFRAN ) injection 4 mg (4 mg Intravenous Given 09/25/23 1338)  iohexol  (OMNIPAQUE ) 300 MG/ML solution 100 mL (100 mLs Intravenous Contrast Given 09/25/23 1442)  magnesium sulfate IVPB 2 g 50 mL (0 g  Intravenous Stopped 09/25/23 1628)  cefTRIAXone (ROCEPHIN) 1 g in sodium chloride  0.9 % 100 mL IVPB (0 g Intravenous Stopped 09/25/23 1725)  azithromycin  (ZITHROMAX ) 500 mg in sodium chloride  0.9 % 250 mL IVPB (500 mg Intravenous New Bag/Given 09/25/23 1705)  ondansetron  (ZOFRAN ) injection 4 mg (4 mg Intravenous Given 09/25/23 1752)  warfarin (COUMADIN ) tablet 10 mg (10 mg Oral Given 09/26/23 0010)  enoxaparin  (LOVENOX ) 100 mg/mL injection 165 mg (165 mg Subcutaneous Given 09/26/23 0010)  magnesium sulfate IVPB 2 g 50 mL (0 g Intravenous Stopped 09/25/23 2318)  sodium chloride  0.9 % bolus 1,000 mL (0 mLs Intravenous Stopped 09/25/23 2317)  sodium chloride  0.9 % bolus 1,000 mL (0 mLs Intravenous Stopped 09/26/23 0019)  ibuprofen (ADVIL) tablet 400 mg (400 mg Oral Given 09/25/23 2345)  iohexol  (OMNIPAQUE ) 350 MG/ML injection 75 mL (75 mLs Intravenous Contrast Given 09/26/23 0116)  warfarin (COUMADIN ) tablet 5 mg (5 mg Oral Given 09/26/23 1547)    ED Course/ Medical Decision Making/ A&P                                 Medical Decision Making Amount and/or Complexity of Data Reviewed Labs: ordered. Radiology: ordered.  Risk Prescription drug management. Decision regarding hospitalization.   This patient presents to the ED for concern of abd pain, this involves an extensive number of treatment options, and is a complaint that carries with it a high risk of complications and morbidity.  The differential diagnosis includes diverticulitis, colitis, gastritis, sepsis   Co morbidities that complicate the patient evaluation  htn, dm, morbid obesity, PE (on couamdin) and hld   Additional history obtained:  Additional history obtained from epic chart review  Lab Tests:  I Ordered, and personally interpreted labs.  The pertinent results include:  cbc with wbc elevated at 17, cmp nl other than cr sl elevated at 1.24 (cr 1.01 a year ago); lip elevated at 58; lactic 1.6; VBG with nl pH; mg low at 1.5;  trop sl elevated at 34   Imaging Studies ordered:  I ordered imaging studies including cxr/ct abd/pelvis  I independently visualized and interpreted imaging which showed  CXR: No active disease.  CT abd/pelvis: No acute inflammatory process identified within the abdomen or  pelvis.  2. There is opacity in the inferior lingular segment of left upper  lobe with air bronchogram and without volume loss, favoring left  lung upper lobe pneumonia. Follow-up to  clearing is recommended.  3. Multiple other nonacute observations, as described above.   I agree with the radiologist interpretation   Cardiac Monitoring:  The patient was maintained on a cardiac monitor.  I personally viewed and interpreted the cardiac monitored which showed an underlying rhythm of: st   Medicines ordered and prescription drug management:  I ordered medication including ivfs/zofran   for sx  Reevaluation of the patient after these medicines showed that the patient improved I have reviewed the patients home medicines and have made adjustments as needed   Test Considered:  ct   Critical Interventions:  Oxygen/abx   Consultations Obtained:  I requested consultation with the hospitalist (Dr. Felipe Horton),  and discussed lab and imaging findings as well as pertinent plan - he will admit   Problem List / ED Course:  LUL pna: pt is requiring 2L oxygen to keep sats above 95%.  Nl lactic.  Blood cx sent.  Rocephin/zithromax  given. N/v/d:  improved.   Hypomagnesemia:  pt given 2 g mg. EKG changes: pt is going in and out of wide complex rhythms.  Possibly from hypomag.  This occurred prior to getting zofran .  Cards consulted.  I am waiting for a call back.   Reevaluation:  After the interventions noted above, I reevaluated the patient and found that they have :improved   Social Determinants of Health:  Lives at home   Dispostion:  After consideration of the diagnostic results and the patients response  to treatment, I feel that the patent would benefit from admission.          Final Clinical Impression(s) / ED Diagnoses Final diagnoses:  Hypomagnesemia  Community acquired pneumonia of left upper lobe of lung  Nausea and vomiting, unspecified vomiting type  Acute cystitis with hematuria    Rx / DC Orders ED Discharge Orders     None         Sueellen Emery, MD 09/28/23 [phone]

## 2023-09-25 NOTE — Plan of Care (Signed)
 Transfer from [facility]  Natasha Rollins is a 63 year old female with pmh HTN, HLD, DM type II, morbid obesity, PE on Coumadin , and obesity present with complaints of fever with nausea and vomiting.  Initially noted to be afebrile but tachycardic.  Labs significant for WBC 17.4 and lactic acid 1.6.  CT scan of the abdomen and pelvis obtained which revealed concern for left upper lobe pneumonia.  Blood cultures were obtained and patient had been started on empiric antibiotics with Rocephin and azithromycin .  Also reported to have arrhythmia for which ED provider plan to discuss with cardiology for further recommendations, but suspected secondary to electrolyte abnormalities with low magnesium which was being replaced.  Accepted to a telemetry bed.

## 2023-09-25 NOTE — ED Notes (Signed)
 Blood cultures, IV and blood work by Engineer, materials.

## 2023-09-25 NOTE — Progress Notes (Signed)
 PHARMACY - ANTICOAGULATION CONSULT NOTE  Pharmacy Consult for Warfarin  Indication: history of PE/ BLE DVT  Allergies  Allergen Reactions   Vitamin D     Other Reaction(s): Rash on face   Penicillins Itching and Rash   Saxagliptin Rash    Patient Measurements: Height: 5\' 4"  (162.6 cm) Weight: (!) 161.9 kg (356 lb 14.8 oz) IBW/kg (Calculated) : 54.7 HEPARIN  DW (KG): 96.4  Vital Signs: Temp: 100.5 F (38.1 C) (05/20 2008) Temp Source: Oral (05/20 1849) BP: 118/62 (05/20 2008) Pulse Rate: 150 (05/20 2008)  Labs: Recent Labs    09/25/23 1317 09/25/23 1411 09/25/23 2010  HGB 11.9* 10.9*  --   HCT 38.0 32.0*  --   PLT 306  --   --   LABPROT  --   --  17.1*  INR  --   --  1.4*  CREATININE 1.24*  --   --     Estimated Creatinine Clearance: 71.5 mL/min (A) (by C-G formula based on SCr of 1.24 mg/dL (H)).   Medical History: Past Medical History:  Diagnosis Date   Diabetes mellitus without complication (HCC)    Fat necrosis 05/28/2018   Hidradenitis suppurativa 05/28/2018   Hypertension    Morbid obesity (HCC)    Right second toe ulcer (HCC) 05/28/2018    Medications:  Medications Prior to Admission  Medication Sig Dispense Refill Last Dose/Taking   amLODipine  (NORVASC ) 10 MG tablet Take 10 mg by mouth daily.  8 09/23/2023   folic acid  (FOLVITE ) 1 MG tablet Take 1 mg by mouth daily.   09/23/2023   LEVEMIR  FLEXTOUCH 100 UNIT/ML Pen Inject 75 Units into the skin 2 (two) times daily.    09/23/2023   losartan-hydrochlorothiazide  (HYZAAR) 100-25 MG tablet Take 1 tablet by mouth daily.  4 09/23/2023   NOVOLOG  FLEXPEN 100 UNIT/ML FlexPen Inject 30 Units into the skin 3 (three) times daily with meals.    09/23/2023   pregabalin (LYRICA) 50 MG capsule pregabalin 50 mg capsule  TAKE 1 CAPSULE BY MOUTH TWICE A DAY   09/23/2023   rosuvastatin (CRESTOR) 10 MG tablet rosuvastatin 10 mg tablet  TAKE 1 TABLET BY MOUTH FOR 3 DAYS A WEEK   09/23/2023   Vitamin D, Ergocalciferol, (DRISDOL)  1.25 MG (50000 UNIT) CAPS capsule Take 50,000 Units by mouth once a week.   09/23/2023   warfarin (COUMADIN ) 5 MG tablet Take 1 tablet (5 mg total) by mouth daily at 6 PM. 30 tablet 0 09/23/2023   allopurinol  (ZYLOPRIM ) 100 MG tablet Take 1 tablet (100 mg total) by mouth daily. (Patient not taking: Reported on 09/25/2023) 30 tablet 6 Not Taking   azithromycin  (ZITHROMAX ) 250 MG tablet Take 1 tablet (250 mg total) by mouth daily. Take first 2 tablets together, then 1 every day until finished. (Patient not taking: Reported on 09/25/2023) 6 tablet 0 Not Taking   B-D UF III MINI PEN NEEDLES 31G X 5 MM MISC USE AS DIRECTED 5 TIMES A DAY      benzonatate  (TESSALON ) 100 MG capsule Take 1 capsule (100 mg total) by mouth every 8 (eight) hours. (Patient not taking: Reported on 09/25/2023) 15 capsule 0 Not Taking   bisacodyl  (DULCOLAX) 5 MG EC tablet Take 4 tablets at 3:00 pm on 04/05/22 (Patient not taking: Reported on 09/25/2023) 4 tablet 0 Not Taking   Blood Glucose Monitoring Suppl (FREESTYLE FREEDOM LITE) w/Device KIT See admin instructions.      colchicine  0.6 MG tablet Take 1 tablet (0.6 mg  total) by mouth daily. (Patient not taking: Reported on 09/25/2023) 10 tablet 0 Not Taking   gabapentin  (NEURONTIN ) 300 MG capsule Take 1 capsule (300 mg total) by mouth at bedtime. (Patient not taking: Reported on 09/25/2023) 90 capsule 0 Not Taking   Lancets (ONETOUCH DELICA PLUS LANCET30G) MISC USE AS DIRECTED ONCE A DAY.      ONETOUCH VERIO test strip       polyethylene glycol (COLYTE  WITH FLAVOR PACKS) 240 g solution Take as a split dose following doctor instructions (Patient not taking: Reported on 09/25/2023) 4000 mL 0 Not Taking    Assessment: Pharmacy consulted to dose Warfarin for h/o  PE /BLE DVT (Feb 2018.  PTA dose Warfarin was  5mg  daily, last taken pta 09/23/23. Need to follow up on patient compliance with warfarin .  INR is 1.4 on admission, subtherapeutic.  Hgb 10.9, pltc 306k.  No bleeding reported.  CT  angio to rule out PE pending  Goal of Therapy:  INR 2-3 Monitor platelets by anticoagulation protocol: Yes   Plan:  Give Warfarin 10mg  po  x1 tonigt Monitor daily INR MD considering added Lovenox     Thank you for allowing pharmacy to be part of this patients care team. Alisa Irish, RPh Clinical Pharmacist  09/25/2023,9:38 PM Please check AMION for all [facility] Pharm[facility] phone numbers After 10:00 PM, call Main Pharmacy [phone]

## 2023-09-25 NOTE — ED Notes (Signed)
 RT called to bedside due to decreased SAT = 87%. Placed patient on [facility] and patient also repositioned.

## 2023-09-25 NOTE — ED Notes (Signed)
 [facility] called and are on the way to take patient to [facility] rm[id number]

## 2023-09-26 ENCOUNTER — Telehealth (HOSPITAL_COMMUNITY): Payer: Self-pay | Admitting: Pharmacy Technician

## 2023-09-26 ENCOUNTER — Inpatient Hospital Stay (HOSPITAL_COMMUNITY)

## 2023-09-26 ENCOUNTER — Other Ambulatory Visit (HOSPITAL_COMMUNITY): Payer: Self-pay

## 2023-09-26 DIAGNOSIS — J189 Pneumonia, unspecified organism: Secondary | ICD-10-CM | POA: Diagnosis not present

## 2023-09-26 DIAGNOSIS — A419 Sepsis, unspecified organism: Secondary | ICD-10-CM | POA: Diagnosis not present

## 2023-09-26 LAB — CBC
HCT: 33.7 % — ABNORMAL LOW (ref 36.0–46.0)
Hemoglobin: 10.7 g/dL — ABNORMAL LOW (ref 12.0–15.0)
MCH: 27.5 pg (ref 26.0–34.0)
MCHC: 31.8 g/dL (ref 30.0–36.0)
MCV: 86.6 fL (ref 80.0–100.0)
Platelets: 285 10*3/uL (ref 150–400)
RBC: 3.89 MIL/uL (ref 3.87–5.11)
RDW: 15.6 % — ABNORMAL HIGH (ref 11.5–15.5)
WBC: 13.4 10*3/uL — ABNORMAL HIGH (ref 4.0–10.5)
nRBC: 0 % (ref 0.0–0.2)

## 2023-09-26 LAB — BASIC METABOLIC PANEL WITH GFR
Anion gap: 11 (ref 5–15)
BUN: 19 mg/dL (ref 8–23)
CO2: 24 mmol/L (ref 22–32)
Calcium: 8 mg/dL — ABNORMAL LOW (ref 8.9–10.3)
Chloride: 100 mmol/L (ref 98–111)
Creatinine, Ser: 1.2 mg/dL — ABNORMAL HIGH (ref 0.44–1.00)
GFR, Estimated: 51 mL/min — ABNORMAL LOW (ref >60)
Glucose, Bld: 195 mg/dL — ABNORMAL HIGH (ref 70–99)
Potassium: 3.5 mmol/L (ref 3.5–5.1)
Sodium: 135 mmol/L (ref 135–145)

## 2023-09-26 LAB — HEMOGLOBIN A1C
Hgb A1c MFr Bld: 6.4 % — ABNORMAL HIGH (ref 4.8–5.6)
Mean Plasma Glucose: 136.98 mg/dL

## 2023-09-26 LAB — MAGNESIUM: Magnesium: 2 mg/dL (ref 1.7–2.4)

## 2023-09-26 LAB — RESP PANEL BY RT-PCR (RSV, FLU A&B, COVID)  RVPGX2
Influenza A by PCR: NEGATIVE
Influenza B by PCR: NEGATIVE
Resp Syncytial Virus by PCR: NEGATIVE
SARS Coronavirus 2 by RT PCR: NEGATIVE

## 2023-09-26 LAB — LACTIC ACID, PLASMA
Lactic Acid, Venous: 0.9 mmol/L (ref 0.5–1.9)
Lactic Acid, Venous: 1 mmol/L (ref 0.5–1.9)

## 2023-09-26 LAB — GLUCOSE, CAPILLARY
Glucose-Capillary: 167 mg/dL — ABNORMAL HIGH (ref 70–99)
Glucose-Capillary: 161 mg/dL — ABNORMAL HIGH (ref 70–99)
Glucose-Capillary: 175 mg/dL — ABNORMAL HIGH (ref 70–99)
Glucose-Capillary: 227 mg/dL — ABNORMAL HIGH (ref 70–99)

## 2023-09-26 LAB — PROTIME-INR
INR: 1.6 — ABNORMAL HIGH (ref 0.8–1.2)
Prothrombin Time: 19.4 s — ABNORMAL HIGH (ref 11.4–15.2)

## 2023-09-26 MED ORDER — ACETAMINOPHEN 325 MG PO TABS
650.0000 mg | ORAL_TABLET | ORAL | Status: DC | PRN
  Administered 2023-09-26 – 2023-09-29 (×8): 650 mg via ORAL
  Filled 2023-09-26 (×8): qty 2

## 2023-09-26 MED ORDER — IOHEXOL 350 MG/ML SOLN
75.0000 mL | Freq: Once | INTRAVENOUS | Status: AC | PRN
  Administered 2023-09-26: 75 mL via INTRAVENOUS

## 2023-09-26 MED ORDER — ENOXAPARIN SODIUM 300 MG/3ML IJ SOLN
160.0000 mg | Freq: Two times a day (BID) | INTRAMUSCULAR | Status: DC
  Administered 2023-09-26 – 2023-09-27 (×2): 160 mg via SUBCUTANEOUS
  Filled 2023-09-26 (×4): qty 1.6

## 2023-09-26 MED ORDER — WARFARIN SODIUM 5 MG PO TABS
5.0000 mg | ORAL_TABLET | Freq: Once | ORAL | Status: AC
  Administered 2023-09-26: 5 mg via ORAL
  Filled 2023-09-26: qty 1

## 2023-09-26 NOTE — Telephone Encounter (Signed)
Patient Product/process development scientist completed.    The patient is insured through [facility]. Patient has [employer], may use a copay card, and/or apply for patient assistance if available.    Ran test claim for Eliquis 5 mg and the current 30 day co-pay is $30.00.  Ran test claim for Xarelto 20 mg and the current 30 day co-pay is $30.00.  This test claim was processed through [facility]- copay amounts may vary at other pharmacies due to pharmacy/plan contracts, or as the patient moves through the different stages of their insurance plan.     Roland Earl, CPHT Pharmacy Technician III Certified Patient Advocate [facility] Patient Advocate Team Direct Number: [phone]  Fax: [phone]

## 2023-09-26 NOTE — Progress Notes (Addendum)
 PHARMACY - ANTICOAGULATION CONSULT NOTE  Pharmacy Consult for Warfarin + Lovenox  bridge Indication: history of PE/ BLE DVT 2019  Allergies  Allergen Reactions   Vitamin D     Other Reaction(s): Rash on face   Penicillins Itching and Rash   Saxagliptin Rash    Patient Measurements: Height: 5\' 4"  (162.6 cm) Weight: (!) 161.9 kg (356 lb 14.8 oz) IBW/kg (Calculated) : 54.7 HEPARIN  DW (KG): 96.4  Vital Signs: Temp: 97.9 F (36.6 C) (05/21 1213) Temp Source: Oral (05/21 1213) BP: 137/59 (05/21 1213) Pulse Rate: 95 (05/21 1213)  Labs: Recent Labs    09/25/23 1317 09/25/23 1411 09/25/23 2010 09/26/23 0152 09/26/23 0601  HGB 11.9* 10.9*  --   --   --   HCT 38.0 32.0*  --   --   --   PLT 306  --   --   --   --   LABPROT  --   --  17.1* 19.4*  --   INR  --   --  1.4* 1.6*  --   CREATININE 1.24*  --   --   --  1.20*    Estimated Creatinine Clearance: 73.9 mL/min (A) (by C-G formula based on SCr of 1.2 mg/dL (H)).   Medical History: Past Medical History:  Diagnosis Date   Diabetes mellitus without complication (HCC)    Fat necrosis 05/28/2018   Hidradenitis suppurativa 05/28/2018   Hypertension    Morbid obesity (HCC)    Right second toe ulcer (HCC) 05/28/2018    Medications:  Medications Prior to Admission  Medication Sig Dispense Refill Last Dose/Taking   amLODipine  (NORVASC ) 10 MG tablet Take 10 mg by mouth daily.  8 09/23/2023   folic acid  (FOLVITE ) 1 MG tablet Take 1 mg by mouth daily.   09/23/2023   LEVEMIR  FLEXTOUCH 100 UNIT/ML Pen Inject 75 Units into the skin 2 (two) times daily.    09/23/2023   losartan-hydrochlorothiazide  (HYZAAR) 100-25 MG tablet Take 1 tablet by mouth daily.  4 09/23/2023   NOVOLOG  FLEXPEN 100 UNIT/ML FlexPen Inject 30 Units into the skin 3 (three) times daily with meals.    09/23/2023   pregabalin (LYRICA) 50 MG capsule pregabalin 50 mg capsule  TAKE 1 CAPSULE BY MOUTH TWICE A DAY   09/23/2023   rosuvastatin (CRESTOR) 10 MG tablet  rosuvastatin 10 mg tablet  TAKE 1 TABLET BY MOUTH FOR 3 DAYS A WEEK   09/23/2023   Vitamin D, Ergocalciferol, (DRISDOL) 1.25 MG (50000 UNIT) CAPS capsule Take 50,000 Units by mouth once a week.   09/23/2023   warfarin (COUMADIN ) 5 MG tablet Take 1 tablet (5 mg total) by mouth daily at 6 PM. 30 tablet 0 09/23/2023   allopurinol  (ZYLOPRIM ) 100 MG tablet Take 1 tablet (100 mg total) by mouth daily. (Patient not taking: Reported on 09/25/2023) 30 tablet 6 Not Taking   azithromycin  (ZITHROMAX ) 250 MG tablet Take 1 tablet (250 mg total) by mouth daily. Take first 2 tablets together, then 1 every day until finished. (Patient not taking: Reported on 09/25/2023) 6 tablet 0 Not Taking   B-D UF III MINI PEN NEEDLES 31G X 5 MM MISC USE AS DIRECTED 5 TIMES A DAY      benzonatate  (TESSALON ) 100 MG capsule Take 1 capsule (100 mg total) by mouth every 8 (eight) hours. (Patient not taking: Reported on 09/25/2023) 15 capsule 0 Not Taking   bisacodyl  (DULCOLAX) 5 MG EC tablet Take 4 tablets at 3:00 pm on 04/05/22 (Patient not  taking: Reported on 09/25/2023) 4 tablet 0 Not Taking   Blood Glucose Monitoring Suppl (FREESTYLE FREEDOM LITE) w/Device KIT See admin instructions.      colchicine  0.6 MG tablet Take 1 tablet (0.6 mg total) by mouth daily. (Patient not taking: Reported on 09/25/2023) 10 tablet 0 Not Taking   gabapentin  (NEURONTIN ) 300 MG capsule Take 1 capsule (300 mg total) by mouth at bedtime. (Patient not taking: Reported on 09/25/2023) 90 capsule 0 Not Taking   Lancets (ONETOUCH DELICA PLUS LANCET30G) MISC USE AS DIRECTED ONCE A DAY.      ONETOUCH VERIO test strip       polyethylene glycol (COLYTE  WITH FLAVOR PACKS) 240 g solution Take as a split dose following doctor instructions (Patient not taking: Reported on 09/25/2023) 4000 mL 0 Not Taking    Assessment: Pharmacy consulted to dose Warfarin for h/o PE /BLE DVT (Feb 2018), not a DOAC candidate given body habitus (weight 161 kg, BMI 61).  -PTA warfarin regimen =  5mg  daily, last taken pta 09/23/23. -INR 1.4 (subtherapeutic) on admission  Notable DDI's = azithromycin  and ceftriaxone (5/20 > c) -- increases INR/bleed risk  5/21: INR 1.6, remains subtherapeutic.  Consulted for Lovenox  bridge while INR is subtherapeutic.   Goal of Therapy:  INR 2-3 Monitor platelets by anticoagulation protocol: Yes   Plan:  Warfarin 5 mg PO x 1 Lovenox  160 mg SQ Q12h Monitor daily INR  Cecillia Cogan, PharmD Clinical Pharmacist 09/26/2023  1:10 PM

## 2023-09-26 NOTE — Significant Event (Signed)
 Progress note    BP (!) 122/54 (BP Location: Right Arm)   Pulse 94   Temp 99.2 F (37.3 C) (Oral)   Resp (!) 24   Ht 5\' 4"  (1.626 m)   Wt (!) 161.9 kg   SpO2 98%   BMI 61.27 kg/m     After arriving on the floor the patient's heart rate went up to the 150s and sustained there for several hours.  Her temperature was rising despite getting Tylenol . She had no improvement after a liter of fluids.  She received 2 Rollins liters of fluid which made a total of 3L. I also gave her 1 dose of Motrin despite her AKI. Her tmax was 101.6 Stat CTA of the chest to rule out PE was also done because of her persistent severe tachycardia and subtherapeutic Coumadin  level in this patient with h/o PE and BMI>60. CTA revealed lingular pneumonia but no evidence of PE. Her vitals did eventually improve with her heart rate now at 94.    Imp: Pneumonia and sepsis  Plan: - Sepsis protocol initiated. - Will transfer patient to the progressive unit. - Continue IVF (but patient is on Torsemide daily so monitor for edema)) - She did receive 1 dose of therapeutic Lovenox  because her INR was subtherapeutic, but will transition back to Coumadin , pharmacy to dose.    Natasha Rollins

## 2023-09-26 NOTE — Plan of Care (Signed)

## 2023-09-26 NOTE — Evaluation (Signed)
 Physical Therapy Brief Evaluation and Discharge Note Patient Details Name: Natasha Rollins MRN: [id number] DOB: 19-Oct-1960 Today's Date: 09/26/2023   History of Present Illness  Pt is 63 year old presented to [facility] on  09/25/23 from MD office for sepsis, PNA, and tachycardia. PMH - PE, DM, morbid obesity  Clinical Impression  Pt at or close to baseline with mobility. At home and work she uses her w/c or her rolling walker. Will sign off for PT and refer to mobility team for the remainder of her stay. She is active with aquatic PT as an outpatient and can resume that at DC.        PT Assessment All further PT needs can be met in the next venue of care  Assistance Needed at Discharge  PRN    Equipment Recommendations None recommended by PT  Recommendations for Other Services       Precautions/Restrictions Precautions Precautions: None        Mobility  Bed Mobility   Supine/Sidelying to sit: Modified independent (Device/Increased time), HOB elevated Sit to supine/sidelying: Supervision    Transfers Overall transfer level: Needs assistance Equipment used: Rolling walker (2 wheels) Transfers: Sit to/from Stand Sit to Stand: Supervision           General transfer comment: Assist for line management    Ambulation/Gait Ambulation/Gait assistance: Supervision Gait Distance (Feet): 25 Feet Assistive device: Rolling walker (2 wheels) Gait Pattern/deviations: Step-through pattern, Decreased stride length, Trunk flexed Gait Speed: Below normal General Gait Details: Assist for safety and lines  Home Activity Instructions    Stairs            Modified Rankin (Stroke Patients Only)        Balance Overall balance assessment: Needs assistance Sitting-balance support: No upper extremity supported, Feet supported Sitting balance-Leahy Scale: Good     Standing balance support: No upper extremity supported Standing balance-Leahy Scale: Fair             Pertinent Vitals/Pain PT - Brief Vital Signs All Vital Signs Stable: No Exception to Vital Signs Stable: HR to 120's with amb. SpO2 >92% on RA with activity and at rest Pain Assessment Pain Assessment: No/denies pain     Home Living Family/patient expects to be discharged to:: Private residence Living Arrangements: Spouse/significant other Available Help at Discharge: Family     Home Equipment: Agricultural consultant (2 wheels);Wheelchair - manual        Prior Function Level of Independence: Independent with assistive device(s) Comments: Uses w/c or rolling walker for mobility.    UE/LE Assessment   UE ROM/Strength/Tone/Coordination:  (defer to OT)    LE ROM/Strength/Tone/Coordination: Generalized weakness      Communication   Communication Communication: No apparent difficulties     Cognition Overall Cognitive Status: Appears within functional limits for tasks assessed/performed       General Comments      Exercises     Assessment/Plan    PT Problem List         PT Visit Diagnosis Other abnormalities of gait and mobility (R26.89);Muscle weakness (generalized) (M62.81)    No Skilled PT     Co-evaluation                AMPAC 6 Clicks Help needed turning from your back to your side while in a flat bed without using bedrails?: None Help needed moving from lying on your back to sitting on the side of a flat bed without using bedrails?: A Little Help  needed moving to and from a bed to a chair (including a wheelchair)?: A Little Help needed standing up from a chair using your arms (e.g., wheelchair or bedside chair)?: A Little Help needed to walk in hospital room?: A Little Help needed climbing 3-5 steps with a railing? : A Little 6 Click Score: 19      End of Session   Activity Tolerance: Patient tolerated treatment well Patient left: in bed;with call bell/phone within reach   PT Visit Diagnosis: Other abnormalities of gait and mobility  (R26.89);Muscle weakness (generalized) (M62.81)     Time: 7564-3329 PT Time Calculation (min) (ACUTE ONLY): 21 min  Charges:   PT Evaluation $PT Eval Moderate Complexity: 1 Mod      [facility] PT Acute Rehabilitation Services Office [phone]   Pura Browns [facility]  09/26/2023, 2:51 PM

## 2023-09-26 NOTE — Progress Notes (Signed)
 PROGRESS NOTE    Natasha Rollins  ZOX:[id number] DOB: 06-Jul-1960 DOA: 09/25/2023 PCP: Olin Bertin, MD    Brief Narrative:  63 year old with history of pulmonary embolism on Coumadin , type 2 diabetes, morbid obesity presented to primary care office with about 1 day of nausea, dizziness and not feeling well.  She also had 1 episode of vomiting.  She was very sleepy at the office so they sent her to the ER.  In the emergency room, hemodynamically stable.  Initially tachycardic.  CT scan abdomen pelvis with left upper lobe infiltrate.  Temperature 101.6 and sinus tachycardia with heart rate up to 150.  Blood pressure stable.  Admitted with IV fluids, IV antibiotics.  Subjective: Patient seen and examined.  Overnight events noted.  Patient was persistently tachycardic so she was transferred to progressive unit.  Patient was wondering why she needs oxygen.  Denies any cough or sputum production.  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 Assessment & Plan:   Community-acquired pneumonia: Antibiotics to treat bacterial pneumonia, continue Rocephin and azithromycin  today. Chest physiotherapy, incentive spirometry, deep breathing exercises, sputum induction, mucolytic's and bronchodilators. Sputum cultures, blood cultures, Legionella and streptococcal antigen. Supplemental oxygen to keep saturations more than 90%. Mobilize with PT OT. Influenza A and B, RSV and COVID-19 negative.  Type 2 diabetes with hyperglycemia: On insulin  at home.  Continued.  Hypomagnesemia: Replaced and adequate.  Sinus tachycardia: Appropriate to her fever.  Currently stable.  History of pulmonary embolism on Coumadin  therapy: Last pulmonary embolism 2017.  Patient has not tried any NOAC.  She is comfortable using Coumadin .  INR is subtherapeutic.  Continue Lovenox  bridging and redose Coumadin  tonight.  DVT prophylaxis:  warfarin (COUMADIN ) tablet 5 mg   Code Status: Full code Family  Communication: None at the bedside, sister on the phone Disposition Plan: Status is: Inpatient Remains inpatient appropriate because: IV antibiotics     Consultants:  None  Procedures:  None  Antimicrobials:  Rocephin azithromycin  5/20----     Objective: Vitals:   09/26/23 0513 09/26/23 0730 09/26/23 0852 09/26/23 1213  BP: (!) 105/47  (!) 117/52 (!) 137/59  Pulse: 77  78 95  Resp: 20  18 18   Temp:  98.1 F (36.7 C)  97.9 F (36.6 C)  TempSrc:  Oral  Oral  SpO2:  100% 100% 93%  Weight:      Height:        Intake/Output Summary (Last 24 hours) at 09/26/2023 1545 Last data filed at 09/26/2023 1500 Gross per 24 hour  Intake 3303.97 ml  Output 50 ml  Net 3253.97 ml   Filed Weights   09/25/23 1254 09/25/23 1956  Weight: (!) 158.8 kg (!) 161.9 kg    Examination:  General exam: Appears calm and comfortable.  Mostly on room air. Respiratory system: Clear to auscultation. Respiratory effort normal. Cardiovascular system: S1 & S2 heard, RRR. No JVD, murmurs, rubs, gallops or clicks. No pedal edema. Gastrointestinal system: Abdomen is nondistended, soft and nontender. No organomegaly or masses felt. Normal bowel sounds heard. Central nervous system: Alert and oriented. No focal neurological deficits. Extremities: Symmetric 5 x 5 power. Skin: No rashes, lesions or ulcers Psychiatry: Judgement and insight appear normal. Mood & affect appropriate.     Data Reviewed: I have personally reviewed following labs and imaging studies  CBC: Recent Labs  Lab 09/25/23 1317 09/25/23 1411 09/26/23 1134  WBC 17.4*  --  13.4*  NEUTROABS 14.4*  --   --  HGB 11.9* 10.9* 10.7*  HCT 38.0 32.0* 33.7*  MCV 86.6  --  86.6  PLT 306  --  285   Basic Metabolic Panel: Recent Labs  Lab 09/25/23 1317 09/25/23 1411 09/25/23 2010 09/26/23 0601  NA 136 134*  --  135  K 3.7 3.7 3.7 3.5  CL 93*  --   --  100  CO2 28  --   --  24  GLUCOSE 191*  --   --  195*  BUN 22  --   --  19   CREATININE 1.24*  --   --  1.20*  CALCIUM 9.8  --   --  8.0*  MG 1.5*  --  1.7 2.0   GFR: Estimated Creatinine Clearance: 73.9 mL/min (A) (by C-G formula based on SCr of 1.2 mg/dL (H)). Liver Function Tests: Recent Labs  Lab 09/25/23 1317  AST 36  ALT 22  ALKPHOS 78  BILITOT 0.9  PROT 8.2*  ALBUMIN 3.9   Recent Labs  Lab 09/25/23 1317  LIPASE 58*   No results for input(s): "AMMONIA" in the last 168 hours. Coagulation Profile: Recent Labs  Lab 09/25/23 2010 09/26/23 0152  INR 1.4* 1.6*   Cardiac Enzymes: No results for input(s): "CKTOTAL", "CKMB", "CKMBINDEX", "TROPONINI" in the last 168 hours. BNP (last 3 results) No results for input(s): "PROBNP" in the last 8760 hours. HbA1C: Recent Labs    09/26/23 0152  HGBA1C 6.4*   CBG: Recent Labs  Lab 09/25/23 2024 09/26/23 0808 09/26/23 1213  GLUCAP 140* 167* 161*   Lipid Profile: No results for input(s): "CHOL", "HDL", "LDLCALC", "TRIG", "CHOLHDL", "LDLDIRECT" in the last 72 hours. Thyroid  Function Tests: No results for input(s): "TSH", "T4TOTAL", "FREET4", "T3FREE", "THYROIDAB" in the last 72 hours. Anemia Panel: No results for input(s): "VITAMINB12", "FOLATE", "FERRITIN", "TIBC", "IRON", "RETICCTPCT" in the last 72 hours. Sepsis Labs: Recent Labs  Lab 09/25/23 1326 09/25/23 1526 09/25/23 2347 09/26/23 0152  LATICACIDVEN 1.6 1.7 1.0 0.9    Recent Results (from the past 240 hours)  Culture, blood (routine x 2)     Status: None (Preliminary result)   Collection Time: 09/25/23  1:34 PM   Specimen: BLOOD  Result Value Ref Range Status   Specimen Description   Final    BLOOD LEFT ANTECUBITAL Performed at [facility], [address], [place], [place] [address]    Special Requests   Final    BOTTLES DRAWN AEROBIC AND ANAEROBIC Blood Culture adequate volume Performed at [facility], [address], [place], [place] [address]    Culture   Final    NO GROWTH <  12 HOURS Performed at [facility], [address], [place], [place] [address]    Report Status PENDING  Incomplete  Resp panel by RT-PCR (RSV, Flu A&B, Covid) Anterior Nasal Swab     Status: None   Collection Time: 09/25/23 11:19 PM   Specimen: Anterior Nasal Swab  Result Value Ref Range Status   SARS Coronavirus 2 by RT PCR NEGATIVE NEGATIVE Final   Influenza A by PCR NEGATIVE NEGATIVE Final   Influenza B by PCR NEGATIVE NEGATIVE Final    Comment: (NOTE) The Xpert Xpress SARS-CoV-2/FLU/RSV plus assay is intended as an aid in the diagnosis of influenza from Nasopharyngeal swab specimens and should not be used as a sole basis for treatment. Nasal washings and aspirates are unacceptable for Xpert Xpress SARS-CoV-2/FLU/RSV testing.  Fact Sheet for Patients: BloggerCourse.com  Fact Sheet for Healthcare Providers:  SeriousBroker.it  This test is not yet approved or cleared by the [place]  FDA and has been authorized for detection and/or diagnosis of SARS-CoV-2 by FDA under an Emergency Use Authorization (EUA). This EUA will remain in effect (meaning this test can be used) for the duration of the COVID-19 declaration under Section 564(b)(1) of the Act, 21 U.S.C. section 360bbb-3(b)(1), unless the authorization is terminated or revoked.     Resp Syncytial Virus by PCR NEGATIVE NEGATIVE Final    Comment: (NOTE) Fact Sheet for Patients: BloggerCourse.com  Fact Sheet for Healthcare Providers: SeriousBroker.it  This test is not yet approved or cleared by the [place]  FDA and has been authorized for detection and/or diagnosis of SARS-CoV-2 by FDA under an Emergency Use Authorization (EUA). This EUA will remain in effect (meaning this test can be used) for the duration of the COVID-19 declaration under Section 564(b)(1) of the Act, 21 U.S.C. section 360bbb-3(b)(1), unless  the authorization is terminated or revoked.  Performed at [facility], [address] [place], [place] [address]          Radiology Studies: CT Angio Chest Pulmonary Embolism (PE) W or WO Contrast Result Date: 09/26/2023 CLINICAL DATA:  Tachycardia and vomiting EXAM: CT ANGIOGRAPHY CHEST WITH CONTRAST TECHNIQUE: Multidetector CT imaging of the chest was performed using the standard protocol during bolus administration of intravenous contrast. Multiplanar CT image reconstructions and MIPs were obtained to evaluate the vascular anatomy. RADIATION DOSE REDUCTION: This exam was performed according to the departmental dose-optimization program which includes automated exposure control, adjustment of the mA and/or kV according to patient size and/or use of iterative reconstruction technique. CONTRAST:  75mL OMNIPAQUE  IOHEXOL  350 MG/ML SOLN COMPARISON:  Chest x-ray from the previous day, CT from 07/04/2022 FINDINGS: Cardiovascular: Atherosclerotic calcifications of the aorta are noted. Pulmonary artery shows a normal branching pattern without definitive pulmonary embolus. The peripheral branches are somewhat incompletely opacified. No large central embolus is noted. Mediastinum/Nodes: The esophagus is within normal limits. No hilar or mediastinal adenopathy is noted. Thoracic inlet demonstrates diffusely enlarged right lobe of thyroid . This has been recently evaluated by ultrasound. Lungs/Pleura: The lungs are well aerated bilaterally. Bibasilar atelectasis is seen. More focal consolidation in the lingula is noted consistent with acute pneumonia. Upper Abdomen: Visualized upper abdomen is within normal limits. Musculoskeletal: No chest wall abnormality. No acute or significant osseous findings. Review of the MIP images confirms the above findings. IMPRESSION: Marked lingular consolidation with evidence of bibasilar atelectasis. No evidence of central pulmonary embolus. Electronically Signed   By: Violeta Grey M.D.   On: 09/26/2023 01:24   CT ABDOMEN PELVIS W CONTRAST Result Date: 09/25/2023 CLINICAL DATA:  Abdominal pain, acute, nonlocalized. EXAM: CT ABDOMEN AND PELVIS WITH CONTRAST TECHNIQUE: Multidetector CT imaging of the abdomen and pelvis was performed using the standard protocol following bolus administration of intravenous contrast. RADIATION DOSE REDUCTION: This exam was performed according to the departmental dose-optimization program which includes automated exposure control, adjustment of the mA and/or kV according to patient size and/or use of iterative reconstruction technique. CONTRAST:  OMNIPAQUE  IOHEXOL  300 MG/ML  SOLN COMPARISON:  CT scan abdomen and pelvis from 11/24/2021. FINDINGS: Lower chest: There is opacity in the inferior lingular segment of left upper lobe with air bronchogram and without volume loss, favoring left lung upper lobe pneumonia. Bilateral lungs are otherwise clear. No pleural effusion. Normal cardiac size. No pericardial effusion. Hepatobiliary: The liver is normal in size. Non-cirrhotic configuration. No suspicious mass. There are  several subcentimeter hypoattenuating foci scattered throughout the liver, which are too small to adequately characterize. No intrahepatic or extrahepatic bile duct dilation. Gallbladder is surgically absent. Pancreas: Unremarkable. No pancreatic ductal dilatation or surrounding inflammatory changes. Spleen: Normal in size. There is an ill-defined approximately 2.3 x 2.4 cm hypoattenuating focus in the inferolateral portion, which is incompletely characterized on the current exam but present since the prior study the. Adrenals/Urinary Tract: Redemonstration of at least 2 myelolipomas in the left adrenal gland. Unremarkable right adrenal gland. No suspicious renal mass. There are several scattered 1 2 foreign 0.5 cm sized sinus cysts throughout bilateral kidneys. No hydronephrosis. No renal or ureteric calculi. Unremarkable urinary  bladder. Stomach/Bowel: No disproportionate dilation of the small or large bowel loops. No evidence of abnormal bowel wall thickening or inflammatory changes. The appendix is unremarkable. Vascular/Lymphatic: No ascites or pneumoperitoneum. No abdominal or pelvic lymphadenopathy, by size criteria. No aneurysmal dilation of the major abdominal arteries. PICC 1 Reproductive: Not well evaluated on the CT scan exam; however, note is again made of large partially calcified lesion arising from the fundus/anterior uterine body, grossly similar to the prior study, favored to represent a leiomyoma. No large adnexal mass seen. Other: There are soft tissue density areas in the anterior abdominal wall subcutaneous tissue, most likely due to medication injections. Musculoskeletal: No suspicious osseous lesions. There are mild - moderate multilevel degenerative changes in the visualized spine. IMPRESSION: 1. No acute inflammatory process identified within the abdomen or pelvis. 2. There is opacity in the inferior lingular segment of left upper lobe with air bronchogram and without volume loss, favoring left lung upper lobe pneumonia. Follow-up to clearing is recommended. 3. Multiple other nonacute observations, as described above. Electronically Signed   By: Beula Brunswick M.D.   On: 09/25/2023 15:13   DG Chest Portable 1 View Result Date: 09/25/2023 CLINICAL DATA:  Weakness. EXAM: PORTABLE CHEST 1 VIEW COMPARISON:  07/04/2022. FINDINGS: Low lung volume. Bilateral lung fields are clear. Bilateral costophrenic angles are clear. Note is made of elevated right hemidiaphragm. Stable cardio-mediastinal silhouette. No acute osseous abnormalities. The soft tissues are within normal limits. IMPRESSION: No active disease. Electronically Signed   By: Beula Brunswick M.D.   On: 09/25/2023 15:03        Scheduled Meds:  amLODipine   10 mg Oral Daily   diltiazem  15 mg Intravenous Once   enoxaparin  (LOVENOX ) injection  160 mg  Subcutaneous Q12H   folic acid   1 mg Oral Daily   insulin  aspart  0-9 Units Subcutaneous TID WC   insulin  glargine-yfgn  75 Units Subcutaneous BID   pregabalin  50 mg Oral BID   warfarin  5 mg Oral ONCE-1600   Warfarin - Pharmacist Dosing Inpatient   Does not apply q1600   Continuous Infusions:  azithromycin  500 mg (09/26/23 1123)   cefTRIAXone (ROCEPHIN)  IV 2 g (09/26/23 0704)     LOS: 1 day    Time spent: 52 minutes    Vada Garibaldi, MD [facility]

## 2023-09-26 NOTE — Sepsis Progress Note (Signed)
 Elink monitoring for the code sepsis protocol.   Blood cultures and antibiotics given before code sepsis initiated. Notified bedside nurse of need to draw lactic acid.

## 2023-09-26 NOTE — Progress Notes (Signed)
 SATURATION QUALIFICATIONS: (This note is used to comply with regulatory documentation for home oxygen)  Patient Saturations on Room Air at Rest = 97%  Patient Saturations on Room Air while Ambulating = 93%  Patient Saturations on N/A Liters of oxygen while Ambulating = N/A%  Please briefly explain why patient needs home oxygen: Did not demonstrate need for supplemental O2 during session.   Natasha Rollins [facility] PT Acute Rehabilitation Services Office [phone]

## 2023-09-27 DIAGNOSIS — A419 Sepsis, unspecified organism: Secondary | ICD-10-CM | POA: Diagnosis not present

## 2023-09-27 DIAGNOSIS — J189 Pneumonia, unspecified organism: Secondary | ICD-10-CM | POA: Diagnosis not present

## 2023-09-27 LAB — GLUCOSE, CAPILLARY
Glucose-Capillary: 113 mg/dL — ABNORMAL HIGH (ref 70–99)
Glucose-Capillary: 175 mg/dL — ABNORMAL HIGH (ref 70–99)
Glucose-Capillary: 142 mg/dL — ABNORMAL HIGH (ref 70–99)
Glucose-Capillary: 202 mg/dL — ABNORMAL HIGH (ref 70–99)

## 2023-09-27 LAB — BLOOD CULTURE ID PANEL (REFLEXED) - BCID2

## 2023-09-27 LAB — BASIC METABOLIC PANEL WITH GFR
Anion gap: 11 (ref 5–15)
BUN: 18 mg/dL (ref 8–23)
CO2: 25 mmol/L (ref 22–32)
Calcium: 7.9 mg/dL — ABNORMAL LOW (ref 8.9–10.3)
Chloride: 101 mmol/L (ref 98–111)
Creatinine, Ser: 1.17 mg/dL — ABNORMAL HIGH (ref 0.44–1.00)
GFR, Estimated: 52 mL/min — ABNORMAL LOW (ref >60)
Glucose, Bld: 172 mg/dL — ABNORMAL HIGH (ref 70–99)
Potassium: 3.2 mmol/L — ABNORMAL LOW (ref 3.5–5.1)
Sodium: 137 mmol/L (ref 135–145)

## 2023-09-27 LAB — CBC WITH DIFFERENTIAL/PLATELET
Abs Immature Granulocytes: 0.19 10*3/uL — ABNORMAL HIGH (ref 0.00–0.07)
Basophils Absolute: 0.1 10*3/uL (ref 0.0–0.1)
Basophils Relative: 0 %
Eosinophils Absolute: 0.2 10*3/uL (ref 0.0–0.5)
Eosinophils Relative: 1 %
HCT: 32.1 % — ABNORMAL LOW (ref 36.0–46.0)
Hemoglobin: 10 g/dL — ABNORMAL LOW (ref 12.0–15.0)
Immature Granulocytes: 1 %
Lymphocytes Relative: 11 %
Lymphs Abs: 1.7 10*3/uL (ref 0.7–4.0)
MCH: 27.3 pg (ref 26.0–34.0)
MCHC: 31.2 g/dL (ref 30.0–36.0)
MCV: 87.7 fL (ref 80.0–100.0)
Monocytes Absolute: 2.2 10*3/uL — ABNORMAL HIGH (ref 0.1–1.0)
Monocytes Relative: 15 %
Neutro Abs: 11 10*3/uL — ABNORMAL HIGH (ref 1.7–7.7)
Neutrophils Relative %: 72 %
Platelets: 289 10*3/uL (ref 150–400)
RBC: 3.66 MIL/uL — ABNORMAL LOW (ref 3.87–5.11)
RDW: 15.4 % (ref 11.5–15.5)
WBC: 15.4 10*3/uL — ABNORMAL HIGH (ref 4.0–10.5)
nRBC: 0 % (ref 0.0–0.2)

## 2023-09-27 LAB — PROTIME-INR
INR: 1.9 — ABNORMAL HIGH (ref 0.8–1.2)
Prothrombin Time: 21.7 s — ABNORMAL HIGH (ref 11.4–15.2)

## 2023-09-27 LAB — PROCALCITONIN: Procalcitonin: 0.3 ng/mL

## 2023-09-27 MED ORDER — WARFARIN SODIUM 5 MG PO TABS
5.0000 mg | ORAL_TABLET | Freq: Every day | ORAL | Status: DC
  Administered 2023-09-27 – 2023-09-29 (×3): 5 mg via ORAL
  Filled 2023-09-27 (×4): qty 1

## 2023-09-27 MED ORDER — SODIUM CHLORIDE 0.9% FLUSH: 10.0000 mL | INTRAVENOUS | Status: DC | PRN

## 2023-09-27 MED ORDER — POTASSIUM CHLORIDE CRYS ER 20 MEQ PO TBCR
40.0000 meq | EXTENDED_RELEASE_TABLET | Freq: Two times a day (BID) | ORAL | Status: AC
  Administered 2023-09-27 – 2023-09-28 (×4): 40 meq via ORAL
  Filled 2023-09-27 (×4): qty 2

## 2023-09-27 MED ORDER — SODIUM CHLORIDE 0.9 % IV SOLN: INTRAVENOUS | Status: DC

## 2023-09-27 MED ORDER — SODIUM CHLORIDE 0.9% FLUSH
10.0000 mL | Freq: Two times a day (BID) | INTRAVENOUS | Status: DC
  Administered 2023-09-27 – 2023-09-29 (×6): 10 mL

## 2023-09-27 MED ORDER — SODIUM CHLORIDE 0.9 % IV SOLN
2.0000 g | INTRAVENOUS | Status: AC
  Administered 2023-09-28 – 2023-09-29 (×2): 2 g via INTRAVENOUS
  Filled 2023-09-27 (×2): qty 20

## 2023-09-27 NOTE — Progress Notes (Signed)
 PROGRESS NOTE    Natasha Rollins  WUJ:[id number] DOB: 03/04/1961 DOA: 09/25/2023 PCP: Olin Bertin, MD    Brief Narrative:  62 year old with history of pulmonary embolism on Coumadin , type 2 diabetes, morbid obesity presented to primary care office with about 1 day of nausea, dizziness and not feeling well.  She also had 1 episode of vomiting.  She was very sleepy at the office so they sent her to the ER.  In the emergency room, hemodynamically stable.  Initially tachycardic.  CT scan abdomen pelvis with left upper lobe infiltrate.  Temperature 101.6 and sinus tachycardia with heart rate up to 150.  Blood pressure stable.  Admitted with IV fluids, IV antibiotics.  Subjective: Patient was seen and examined.  She has some dry cough.  She had high fever yesterday evening and she is really worried about that.  No other overnight events.  Patient also reported not urinating as she was supposed to do.  Renal functions are stable.  Potassium is 3.2.   Assessment & Plan:   Community-acquired pneumonia: Antibiotics to treat bacterial pneumonia, continue Rocephin and azithromycin  today. Chest physiotherapy, incentive spirometry, deep breathing exercises, sputum induction, mucolytic's and bronchodilators. Sputum cultures, blood cultures, Legionella and streptococcal antigen. Supplemental oxygen to keep saturations more than 90%. Mobilize with PT OT. Influenza A and B, RSV and COVID-19 negative.  Type 2 diabetes with hyperglycemia: On insulin  at home.  Continued.  Hypomagnesemia: Replaced and adequate.  Hypokalemia: Replace today.  Sinus tachycardia: Appropriate to her fever.  Currently stable.  History of pulmonary embolism on Coumadin  therapy: Last pulmonary embolism 2017.  Patient has not tried any NOAC.  She is comfortable using Coumadin .   INR 1.9 today.  She was given Lovenox  injections.  Patient had hematoma pockets in her abdomen due to previous Lovenox  injection.  Discontinue  further injections.  Re-dose Coumadin  tonight.    DVT prophylaxis:  warfarin (COUMADIN ) tablet 5 mg   Code Status: Full code Family Communication: None at the bedside, Disposition Plan: Status is: Inpatient Remains inpatient appropriate because: IV antibiotics     Consultants:  None  Procedures:  None  Antimicrobials:  Rocephin azithromycin  5/20----     Objective: Vitals:   09/27/23 0027 09/27/23 0357 09/27/23 0753 09/27/23 1240  BP: (!) 119/55 (!) 129/59  115/86  Pulse: 97 92 92 95  Resp:  20  17  Temp:  99.9 F (37.7 C) 99.9 F (37.7 C) 99 F (37.2 C)  TempSrc:  Oral Oral Oral  SpO2: 94% 98% 97% (!) 80%  Weight:      Height:        Intake/Output Summary (Last 24 hours) at 09/27/2023 1316 Last data filed at 09/27/2023 0757 Gross per 24 hour  Intake 822.59 ml  Output 1100 ml  Net -277.41 ml   Filed Weights   09/25/23 1254 09/25/23 1956  Weight: (!) 158.8 kg (!) 161.9 kg    Examination:  General exam: Appears calm and comfortable.  On 2 L oxygen. Respiratory system: Clear to auscultation. Respiratory effort normal.  No added sounds. Cardiovascular system: S1 & S2 heard, RRR. No JVD, murmurs, rubs, gallops or clicks. No pedal edema. Gastrointestinal system: Abdomen is nondistended, soft and nontender. No organomegaly or masses felt. Normal bowel sounds heard.  Obese and pendulous.  Nontender. Central nervous system: Alert and oriented. No focal neurological deficits. Extremities: Symmetric 5 x 5 power. Skin: No rashes, lesions or ulcers Psychiatry: Judgement and insight appear normal. Mood & affect appropriate.  Data Reviewed: I have personally reviewed following labs and imaging studies  CBC: Recent Labs  Lab 09/25/23 1317 09/25/23 1411 09/26/23 1134 09/27/23 0349  WBC 17.4*  --  13.4* 15.4*  NEUTROABS 14.4*  --   --  11.0*  HGB 11.9* 10.9* 10.7* 10.0*  HCT 38.0 32.0* 33.7* 32.1*  MCV 86.6  --  86.6 87.7  PLT 306  --  285 289   Basic  Metabolic Panel: Recent Labs  Lab 09/25/23 1317 09/25/23 1411 09/25/23 2010 09/26/23 0601 09/27/23 1115  NA 136 134*  --  135 137  K 3.7 3.7 3.7 3.5 3.2*  CL 93*  --   --  100 101  CO2 28  --   --  24 25  GLUCOSE 191*  --   --  195* 172*  BUN 22  --   --  19 18  CREATININE 1.24*  --   --  1.20* 1.17*  CALCIUM 9.8  --   --  8.0* 7.9*  MG 1.5*  --  1.7 2.0  --    GFR: Estimated Creatinine Clearance: 75.8 mL/min (A) (by C-G formula based on SCr of 1.17 mg/dL (H)). Liver Function Tests: Recent Labs  Lab 09/25/23 1317  AST 36  ALT 22  ALKPHOS 78  BILITOT 0.9  PROT 8.2*  ALBUMIN 3.9   Recent Labs  Lab 09/25/23 1317  LIPASE 58*   No results for input(s): "AMMONIA" in the last 168 hours. Coagulation Profile: Recent Labs  Lab 09/25/23 2010 09/26/23 0152 09/27/23 0349  INR 1.4* 1.6* 1.9*   Cardiac Enzymes: No results for input(s): "CKTOTAL", "CKMB", "CKMBINDEX", "TROPONINI" in the last 168 hours. BNP (last 3 results) No results for input(s): "PROBNP" in the last 8760 hours. HbA1C: Recent Labs    09/26/23 0152  HGBA1C 6.4*   CBG: Recent Labs  Lab 09/26/23 1213 09/26/23 1625 09/26/23 2105 09/27/23 0755 09/27/23 1111  GLUCAP 161* 175* 227* 113* 175*   Lipid Profile: No results for input(s): "CHOL", "HDL", "LDLCALC", "TRIG", "CHOLHDL", "LDLDIRECT" in the last 72 hours. Thyroid  Function Tests: No results for input(s): "TSH", "T4TOTAL", "FREET4", "T3FREE", "THYROIDAB" in the last 72 hours. Anemia Panel: No results for input(s): "VITAMINB12", "FOLATE", "FERRITIN", "TIBC", "IRON", "RETICCTPCT" in the last 72 hours. Sepsis Labs: Recent Labs  Lab 09/25/23 1326 09/25/23 1526 09/25/23 2347 09/26/23 0152 09/27/23 1115  PROCALCITON  --   --   --   --  0.30  LATICACIDVEN 1.6 1.7 1.0 0.9  --     Recent Results (from the past 240 hours)  Culture, blood (routine x 2)     Status: None (Preliminary result)   Collection Time: 09/25/23  1:34 PM   Specimen: BLOOD   Result Value Ref Range Status   Specimen Description   Final    BLOOD LEFT ANTECUBITAL Performed at [facility], [address], [place], [place] [address]    Special Requests   Final    BOTTLES DRAWN AEROBIC AND ANAEROBIC Blood Culture adequate volume Performed at [facility], [address], [place], [place] [address]    Culture   Final    NO GROWTH 2 DAYS Performed at [facility], [address], [place], [place] [address]    Report Status PENDING  Incomplete  Resp panel by RT-PCR (RSV, Flu A&B, Covid) Anterior Nasal Swab     Status: None   Collection Time: 09/25/23 11:19 PM   Specimen: Anterior Nasal Swab  Result Value Ref Range  Status   SARS Coronavirus 2 by RT PCR NEGATIVE NEGATIVE Final   Influenza A by PCR NEGATIVE NEGATIVE Final   Influenza B by PCR NEGATIVE NEGATIVE Final    Comment: (NOTE) The Xpert Xpress SARS-CoV-2/FLU/RSV plus assay is intended as an aid in the diagnosis of influenza from Nasopharyngeal swab specimens and should not be used as a sole basis for treatment. Nasal washings and aspirates are unacceptable for Xpert Xpress SARS-CoV-2/FLU/RSV testing.  Fact Sheet for Patients: BloggerCourse.com  Fact Sheet for Healthcare Providers: SeriousBroker.it  This test is not yet approved or cleared by the [place]  FDA and has been authorized for detection and/or diagnosis of SARS-CoV-2 by FDA under an Emergency Use Authorization (EUA). This EUA will remain in effect (meaning this test can be used) for the duration of the COVID-19 declaration under Section 564(b)(1) of the Act, 21 U.S.C. section 360bbb-3(b)(1), unless the authorization is terminated or revoked.     Resp Syncytial Virus by PCR NEGATIVE NEGATIVE Final    Comment: (NOTE) Fact Sheet for Patients: BloggerCourse.com  Fact Sheet for Healthcare  Providers: SeriousBroker.it  This test is not yet approved or cleared by the [place]  FDA and has been authorized for detection and/or diagnosis of SARS-CoV-2 by FDA under an Emergency Use Authorization (EUA). This EUA will remain in effect (meaning this test can be used) for the duration of the COVID-19 declaration under Section 564(b)(1) of the Act, 21 U.S.C. section 360bbb-3(b)(1), unless the authorization is terminated or revoked.  Performed at [facility], [address] [place], [place] [address]          Radiology Studies: CT Angio Chest Pulmonary Embolism (PE) W or WO Contrast Result Date: 09/26/2023 CLINICAL DATA:  Tachycardia and vomiting EXAM: CT ANGIOGRAPHY CHEST WITH CONTRAST TECHNIQUE: Multidetector CT imaging of the chest was performed using the standard protocol during bolus administration of intravenous contrast. Multiplanar CT image reconstructions and MIPs were obtained to evaluate the vascular anatomy. RADIATION DOSE REDUCTION: This exam was performed according to the departmental dose-optimization program which includes automated exposure control, adjustment of the mA and/or kV according to patient size and/or use of iterative reconstruction technique. CONTRAST:  75mL OMNIPAQUE  IOHEXOL  350 MG/ML SOLN COMPARISON:  Chest x-ray from the previous day, CT from 07/04/2022 FINDINGS: Cardiovascular: Atherosclerotic calcifications of the aorta are noted. Pulmonary artery shows a normal branching pattern without definitive pulmonary embolus. The peripheral branches are somewhat incompletely opacified. No large central embolus is noted. Mediastinum/Nodes: The esophagus is within normal limits. No hilar or mediastinal adenopathy is noted. Thoracic inlet demonstrates diffusely enlarged right lobe of thyroid . This has been recently evaluated by ultrasound. Lungs/Pleura: The lungs are well aerated bilaterally. Bibasilar atelectasis is seen. More focal  consolidation in the lingula is noted consistent with acute pneumonia. Upper Abdomen: Visualized upper abdomen is within normal limits. Musculoskeletal: No chest wall abnormality. No acute or significant osseous findings. Review of the MIP images confirms the above findings. IMPRESSION: Marked lingular consolidation with evidence of bibasilar atelectasis. No evidence of central pulmonary embolus. Electronically Signed   By: Violeta Grey M.D.   On: 09/26/2023 01:24   CT ABDOMEN PELVIS W CONTRAST Result Date: 09/25/2023 CLINICAL DATA:  Abdominal pain, acute, nonlocalized. EXAM: CT ABDOMEN AND PELVIS WITH CONTRAST TECHNIQUE: Multidetector CT imaging of the abdomen and pelvis was performed using the standard protocol following bolus administration of intravenous contrast. RADIATION DOSE REDUCTION: This exam was performed according to the departmental dose-optimization program which includes automated exposure control, adjustment  of the mA and/or kV according to patient size and/or use of iterative reconstruction technique. CONTRAST:  OMNIPAQUE  IOHEXOL  300 MG/ML  SOLN COMPARISON:  CT scan abdomen and pelvis from 11/24/2021. FINDINGS: Lower chest: There is opacity in the inferior lingular segment of left upper lobe with air bronchogram and without volume loss, favoring left lung upper lobe pneumonia. Bilateral lungs are otherwise clear. No pleural effusion. Normal cardiac size. No pericardial effusion. Hepatobiliary: The liver is normal in size. Non-cirrhotic configuration. No suspicious mass. There are several subcentimeter hypoattenuating foci scattered throughout the liver, which are too small to adequately characterize. No intrahepatic or extrahepatic bile duct dilation. Gallbladder is surgically absent. Pancreas: Unremarkable. No pancreatic ductal dilatation or surrounding inflammatory changes. Spleen: Normal in size. There is an ill-defined approximately 2.3 x 2.4 cm hypoattenuating focus in the  inferolateral portion, which is incompletely characterized on the current exam but present since the prior study the. Adrenals/Urinary Tract: Redemonstration of at least 2 myelolipomas in the left adrenal gland. Unremarkable right adrenal gland. No suspicious renal mass. There are several scattered 1 2 foreign 0.5 cm sized sinus cysts throughout bilateral kidneys. No hydronephrosis. No renal or ureteric calculi. Unremarkable urinary bladder. Stomach/Bowel: No disproportionate dilation of the small or large bowel loops. No evidence of abnormal bowel wall thickening or inflammatory changes. The appendix is unremarkable. Vascular/Lymphatic: No ascites or pneumoperitoneum. No abdominal or pelvic lymphadenopathy, by size criteria. No aneurysmal dilation of the major abdominal arteries. PICC 1 Reproductive: Not well evaluated on the CT scan exam; however, note is again made of large partially calcified lesion arising from the fundus/anterior uterine body, grossly similar to the prior study, favored to represent a leiomyoma. No large adnexal mass seen. Other: There are soft tissue density areas in the anterior abdominal wall subcutaneous tissue, most likely due to medication injections. Musculoskeletal: No suspicious osseous lesions. There are mild - moderate multilevel degenerative changes in the visualized spine. IMPRESSION: 1. No acute inflammatory process identified within the abdomen or pelvis. 2. There is opacity in the inferior lingular segment of left upper lobe with air bronchogram and without volume loss, favoring left lung upper lobe pneumonia. Follow-up to clearing is recommended. 3. Multiple other nonacute observations, as described above. Electronically Signed   By: Beula Brunswick M.D.   On: 09/25/2023 15:13   DG Chest Portable 1 View Result Date: 09/25/2023 CLINICAL DATA:  Weakness. EXAM: PORTABLE CHEST 1 VIEW COMPARISON:  07/04/2022. FINDINGS: Low lung volume. Bilateral lung fields are clear. Bilateral  costophrenic angles are clear. Note is made of elevated right hemidiaphragm. Stable cardio-mediastinal silhouette. No acute osseous abnormalities. The soft tissues are within normal limits. IMPRESSION: No active disease. Electronically Signed   By: Beula Brunswick M.D.   On: 09/25/2023 15:03        Scheduled Meds:  amLODipine   10 mg Oral Daily   folic acid   1 mg Oral Daily   insulin  aspart  0-9 Units Subcutaneous TID WC   insulin  glargine-yfgn  75 Units Subcutaneous BID   potassium chloride   40 mEq Oral BID   pregabalin   50 mg Oral BID   sodium chloride  flush  10-40 mL Intracatheter Q12H   warfarin  5 mg Oral Q0600   Warfarin - Pharmacist Dosing Inpatient   Does not apply q1600   Continuous Infusions:  sodium chloride  100 mL/hr (09/27/23 1034)   azithromycin  500 mg (09/27/23 1003)   cefTRIAXone  (ROCEPHIN )  IV 2 g (09/27/23 0652)     LOS: 2 days  Time spent: 52 minutes    Vada Garibaldi, MD [facility]

## 2023-09-27 NOTE — Progress Notes (Signed)

## 2023-09-27 NOTE — Progress Notes (Addendum)
 PHARMACY - ANTICOAGULATION CONSULT NOTE  Pharmacy Consult for Warfarin + Lovenox  bridge Indication: history of PE/ BLE DVT 2019  Allergies  Allergen Reactions   Vitamin D     Other Reaction(s): Rash on face   Penicillins Itching and Rash   Saxagliptin Rash    Patient Measurements: Height: 5\' 4"  (162.6 cm) Weight: (!) 161.9 kg (356 lb 14.8 oz) IBW/kg (Calculated) : 54.7 HEPARIN  DW (KG): 96.4  Vital Signs: Temp: 99.9 F (37.7 C) (05/22 0753) Temp Source: Oral (05/22 0753) BP: 129/59 (05/22 0357) Pulse Rate: 92 (05/22 0753)  Labs: Recent Labs    09/25/23 1317 09/25/23 1411 09/25/23 2010 09/26/23 0152 09/26/23 0601 09/26/23 1134 09/27/23 0349  HGB 11.9* 10.9*  --   --   --  10.7* 10.0*  HCT 38.0 32.0*  --   --   --  33.7* 32.1*  PLT 306  --   --   --   --  285 289  LABPROT  --   --  17.1* 19.4*  --   --  21.7*  INR  --   --  1.4* 1.6*  --   --  1.9*  CREATININE 1.24*  --   --   --  1.20*  --   --     Estimated Creatinine Clearance: 73.9 mL/min (A) (by C-G formula based on SCr of 1.2 mg/dL (H)).   Medical History: Past Medical History:  Diagnosis Date   Diabetes mellitus without complication (HCC)    Fat necrosis 05/28/2018   Hidradenitis suppurativa 05/28/2018   Hypertension    Morbid obesity (HCC)    Right second toe ulcer (HCC) 05/28/2018    Medications:  Medications Prior to Admission  Medication Sig Dispense Refill Last Dose/Taking   amLODipine  (NORVASC ) 10 MG tablet Take 10 mg by mouth daily.  8 09/23/2023   folic acid  (FOLVITE ) 1 MG tablet Take 1 mg by mouth daily.   09/23/2023   LEVEMIR  FLEXTOUCH 100 UNIT/ML Pen Inject 75 Units into the skin 2 (two) times daily.    09/23/2023   losartan-hydrochlorothiazide  (HYZAAR) 100-25 MG tablet Take 1 tablet by mouth daily.  4 09/23/2023   NOVOLOG  FLEXPEN 100 UNIT/ML FlexPen Inject 30 Units into the skin 3 (three) times daily with meals.    09/23/2023   pregabalin (LYRICA) 50 MG capsule pregabalin 50 mg capsule  TAKE  1 CAPSULE BY MOUTH TWICE A DAY   09/23/2023   rosuvastatin (CRESTOR) 10 MG tablet rosuvastatin 10 mg tablet  TAKE 1 TABLET BY MOUTH FOR 3 DAYS A WEEK   09/23/2023   Vitamin D, Ergocalciferol, (DRISDOL) 1.25 MG (50000 UNIT) CAPS capsule Take 50,000 Units by mouth once a week.   09/23/2023   warfarin (COUMADIN ) 5 MG tablet Take 1 tablet (5 mg total) by mouth daily at 6 PM. 30 tablet 0 09/23/2023   allopurinol  (ZYLOPRIM ) 100 MG tablet Take 1 tablet (100 mg total) by mouth daily. (Patient not taking: Reported on 09/25/2023) 30 tablet 6 Not Taking   azithromycin  (ZITHROMAX ) 250 MG tablet Take 1 tablet (250 mg total) by mouth daily. Take first 2 tablets together, then 1 every day until finished. (Patient not taking: Reported on 09/25/2023) 6 tablet 0 Not Taking   B-D UF III MINI PEN NEEDLES 31G X 5 MM MISC USE AS DIRECTED 5 TIMES A DAY      benzonatate  (TESSALON ) 100 MG capsule Take 1 capsule (100 mg total) by mouth every 8 (eight) hours. (Patient not taking: Reported on  09/25/2023) 15 capsule 0 Not Taking   bisacodyl  (DULCOLAX) 5 MG EC tablet Take 4 tablets at 3:00 pm on 04/05/22 (Patient not taking: Reported on 09/25/2023) 4 tablet 0 Not Taking   Blood Glucose Monitoring Suppl (FREESTYLE FREEDOM LITE) w/Device KIT See admin instructions.      colchicine  0.6 MG tablet Take 1 tablet (0.6 mg total) by mouth daily. (Patient not taking: Reported on 09/25/2023) 10 tablet 0 Not Taking   gabapentin  (NEURONTIN ) 300 MG capsule Take 1 capsule (300 mg total) by mouth at bedtime. (Patient not taking: Reported on 09/25/2023) 90 capsule 0 Not Taking   Lancets (ONETOUCH DELICA PLUS LANCET30G) MISC USE AS DIRECTED ONCE A DAY.      ONETOUCH VERIO test strip       polyethylene glycol (COLYTE  WITH FLAVOR PACKS) 240 g solution Take as a split dose following doctor instructions (Patient not taking: Reported on 09/25/2023) 4000 mL 0 Not Taking    Assessment: Pharmacy consulted to dose Warfarin for h/o PE /BLE DVT (Feb 2018), not a  DOAC candidate given body habitus (weight 161 kg, BMI 61).  -PTA warfarin regimen = 5mg  daily, last taken pta 09/23/23. -INR 1.4 (subtherapeutic) on admission -Notable DDI's = azithromycin  and ceftriaxone (5/20 > c) -- increases INR/bleed risk  5/21: INR 1.9, remains subtherapeutic but appropriately trending up after starting warfarin 5/20 PM.  Hgb 10s, pltc WNL.    Goal of Therapy:  INR 2-3 Monitor platelets by anticoagulation protocol: Yes   Plan:  Warfarin 5 mg PO x 1 -- will enter home regimen and continue daily INR monitoring given antibiotic DDI's Lovenox  160 mg SQ Q12h until INR >2 x24h  Monitor daily INR  ADDENDUM - MD discontinued Lovenox  order, clarified that we will not continue to bridge.  Enoxaparin  consult removed.   Cecillia Cogan, PharmD Clinical Pharmacist 09/27/2023  10:14 AM

## 2023-09-27 NOTE — Evaluation (Signed)
 Occupational Therapy Evaluation and Discharge Patient Details Name: Natasha Rollins MRN: [id number] DOB: 06/30/1960 Today's Date: 09/27/2023   History of Present Illness   Pt is 63 year old presented to [facility] on  09/25/23 from MD office for sepsis, PNA, and tachycardia. PMH - PE, DM, morbid obesity     Clinical Impressions Pt transfers independently and is w/c dependent for mobility at baseline. 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 Pt participates in outpatient PT. Pt is functioning at her baseline with SpO2 of 93% on RA. Recommend ADLs with nursing staff. No further OT needs.      If plan is discharge home, recommend the following:         Functional Status Assessment   Patient has not had a recent decline in their functional status     Equipment Recommendations   None recommended by OT     Recommendations for Other Services         Precautions/Restrictions   Precautions Precautions: Fall Recall of Precautions/Restrictions: Intact Restrictions Weight Bearing Restrictions Per Provider Order: No     Mobility Bed Mobility Overal bed mobility: Modified Independent                  Transfers Overall transfer level: Needs assistance Equipment used: Rolling walker (2 wheels) Transfers: Sit to/from Stand Sit to Stand: Supervision           General transfer comment: Assist for line management      Balance     Sitting balance-Leahy Scale: Good       Standing balance-Leahy Scale: Fair                             ADL either performed or assessed with clinical judgement   ADL Overall ADL's : At baseline                                             Vision Baseline Vision/History: 1 Wears glasses Ability to See in Adequate Light: 0 Adequate Patient Visual Report: No change from baseline       Perception         Praxis         Pertinent Vitals/Pain Pain Assessment Pain Assessment:  No/denies pain     Extremity/Trunk Assessment Upper Extremity Assessment Upper Extremity Assessment: Overall WFL for tasks assessed   Lower Extremity Assessment Lower Extremity Assessment: Defer to PT evaluation       Communication Communication Communication: No apparent difficulties   Cognition Arousal: Alert Behavior During Therapy: WFL for tasks assessed/performed Cognition: No apparent impairments                               Following commands: Intact       Cueing  General Comments   Cueing Techniques: Verbal cues      Exercises     Shoulder Instructions      Home Living Family/patient expects to be discharged to:: Private residence   Available Help at Discharge: Family;Available PRN/intermittently Type of Home: House Home Access: Ramped entrance     Home Layout: Two level Alternate Level Stairs-Number of Steps: lift             Home Equipment: Rolling Walker (2 wheels);Wheelchair - manual  Prior Functioning/Environment Prior Level of Function : Independent/Modified Independent;Working/employed;Driving             Mobility Comments: pt goes to outpatient PT ADLs Comments: mod I including donning compression socks    OT Problem List:     OT Treatment/Interventions:        OT Goals(Current goals can be found in the care plan section)       OT Frequency:       Co-evaluation              AM-PAC OT "6 Clicks" Daily Activity     Outcome Measure Help from another person eating meals?: None Help from another person taking care of personal grooming?: A Little Help from another person toileting, which includes using toliet, bedpan, or urinal?: A Little Help from another person bathing (including washing, rinsing, drying)?: None Help from another person to put on and taking off regular upper body clothing?: None Help from another person to put on and taking off regular lower body clothing?: None 6 Click  Score: 22   End of Session    Activity Tolerance: Patient tolerated treatment well Patient left: in bed;with call bell/phone within reach  OT Visit Diagnosis: Unsteadiness on feet (R26.81)                Time: 1610-9604 OT Time Calculation (min): 19 min Charges:  OT General Charges $OT Visit: 1 Visit OT Evaluation $OT Eval Low Complexity: 1 Low  Avanell Leigh, OTR/L Acute Rehabilitation Services Office: [phone]   Jonette Nestle 09/27/2023, 11:50 AM

## 2023-09-27 NOTE — Progress Notes (Signed)
 PHARMACY - PHYSICIAN COMMUNICATION CRITICAL VALUE ALERT - BLOOD CULTURE IDENTIFICATION (BCID)  Natasha Rollins is an 63 y.o. female who presented to [facility] on 09/25/2023 with a chief complaint of nausea and not feeling well  Assessment:  1 of 2 bottles staph species  Name of physician (or Provider) Contacted: K Ghimire  Current antibiotics: ceftriaxone  Changes to prescribed antibiotics recommended:  Patient is on recommended antibiotics - No changes needed  Results for orders placed or performed during the hospital encounter of 09/25/23  Blood Culture ID Panel (Reflexed) (Collected: 09/25/2023  1:34 PM)  Result Value Ref Range   Enterococcus faecalis NOT DETECTED NOT DETECTED   Enterococcus Faecium NOT DETECTED NOT DETECTED   Listeria monocytogenes NOT DETECTED NOT DETECTED   Staphylococcus species DETECTED (A) NOT DETECTED   Staphylococcus aureus (BCID) NOT DETECTED NOT DETECTED   Staphylococcus epidermidis NOT DETECTED NOT DETECTED   Staphylococcus lugdunensis NOT DETECTED NOT DETECTED   Streptococcus species NOT DETECTED NOT DETECTED   Streptococcus agalactiae NOT DETECTED NOT DETECTED   Streptococcus pneumoniae NOT DETECTED NOT DETECTED   Streptococcus pyogenes NOT DETECTED NOT DETECTED   A.calcoaceticus-baumannii NOT DETECTED NOT DETECTED   Bacteroides fragilis NOT DETECTED NOT DETECTED   Enterobacterales NOT DETECTED NOT DETECTED   Enterobacter cloacae complex NOT DETECTED NOT DETECTED   Escherichia coli NOT DETECTED NOT DETECTED   Klebsiella aerogenes NOT DETECTED NOT DETECTED   Klebsiella oxytoca NOT DETECTED NOT DETECTED   Klebsiella pneumoniae NOT DETECTED NOT DETECTED   Proteus species NOT DETECTED NOT DETECTED   Salmonella species NOT DETECTED NOT DETECTED   Serratia marcescens NOT DETECTED NOT DETECTED   Haemophilus influenzae NOT DETECTED NOT DETECTED   Neisseria meningitidis NOT DETECTED NOT DETECTED   Pseudomonas aeruginosa NOT DETECTED NOT DETECTED    Stenotrophomonas maltophilia NOT DETECTED NOT DETECTED   Candida albicans NOT DETECTED NOT DETECTED   Candida auris NOT DETECTED NOT DETECTED   Candida glabrata NOT DETECTED NOT DETECTED   Candida krusei NOT DETECTED NOT DETECTED   Candida parapsilosis NOT DETECTED NOT DETECTED   Candida tropicalis NOT DETECTED NOT DETECTED   Cryptococcus neoformans/gattii NOT DETECTED NOT DETECTED    Angelo Kennedy Sherrel Ploch 09/27/2023  4:07 PM

## 2023-09-28 DIAGNOSIS — J189 Pneumonia, unspecified organism: Secondary | ICD-10-CM | POA: Diagnosis not present

## 2023-09-28 DIAGNOSIS — A419 Sepsis, unspecified organism: Secondary | ICD-10-CM | POA: Diagnosis not present

## 2023-09-28 LAB — GLUCOSE, CAPILLARY
Glucose-Capillary: 102 mg/dL — ABNORMAL HIGH (ref 70–99)
Glucose-Capillary: 125 mg/dL — ABNORMAL HIGH (ref 70–99)
Glucose-Capillary: 171 mg/dL — ABNORMAL HIGH (ref 70–99)
Glucose-Capillary: 164 mg/dL — ABNORMAL HIGH (ref 70–99)

## 2023-09-28 LAB — PROTIME-INR
INR: 1.9 — ABNORMAL HIGH (ref 0.8–1.2)
Prothrombin Time: 22.2 s — ABNORMAL HIGH (ref 11.4–15.2)

## 2023-09-28 LAB — BASIC METABOLIC PANEL WITH GFR
Anion gap: 10 (ref 5–15)
BUN: 16 mg/dL (ref 8–23)
CO2: 24 mmol/L (ref 22–32)
Calcium: 8.2 mg/dL — ABNORMAL LOW (ref 8.9–10.3)
Chloride: 104 mmol/L (ref 98–111)
Creatinine, Ser: 1.07 mg/dL — ABNORMAL HIGH (ref 0.44–1.00)
GFR, Estimated: 58 mL/min — ABNORMAL LOW (ref >60)
Glucose, Bld: 57 mg/dL — ABNORMAL LOW (ref 70–99)
Potassium: 3.7 mmol/L (ref 3.5–5.1)
Sodium: 138 mmol/L (ref 135–145)

## 2023-09-28 LAB — CBC
HCT: 30.5 % — ABNORMAL LOW (ref 36.0–46.0)
Hemoglobin: 9.8 g/dL — ABNORMAL LOW (ref 12.0–15.0)
MCH: 27.5 pg (ref 26.0–34.0)
MCHC: 32.1 g/dL (ref 30.0–36.0)
MCV: 85.4 fL (ref 80.0–100.0)
Platelets: 334 10*3/uL (ref 150–400)
RBC: 3.57 MIL/uL — ABNORMAL LOW (ref 3.87–5.11)
RDW: 15.2 % (ref 11.5–15.5)
WBC: 13.1 10*3/uL — ABNORMAL HIGH (ref 4.0–10.5)
nRBC: 0 % (ref 0.0–0.2)

## 2023-09-28 NOTE — Progress Notes (Signed)
 Provider came to the bedside to review the EKG reading following patient elevated heart rate and QRS changes reported by CCMD and observed by Clinical research associate. Temp checked and patient had a low grade fever of 100.1. Tylenol  given per MD orders. Patient reported being asymptomatic.

## 2023-09-28 NOTE — Progress Notes (Signed)
 PROGRESS NOTE    Natasha Rollins  OZH:[id number] DOB: [birth year] DOA: 09/25/2023 PCP: Olin Bertin, MD    Brief Narrative:  63 year old with history of pulmonary embolism on Coumadin , type 2 diabetes, morbid obesity presented to primary care office with about 1 day of nausea, dizziness and not feeling well.  She also had 1 episode of vomiting.  She was very sleepy at the office so they sent her to the ER.  In the emergency room, hemodynamically stable.  Initially tachycardic.  CT scan abdomen pelvis with left upper lobe infiltrate.  Temperature 101.6 and sinus tachycardia with heart rate up to 150.  Blood pressure stable.  Admitted with IV fluids, IV antibiotics.  Subjective:  Patient seen and examined.  Tmax 100 yesterday evening.  She has some numbness on the right thigh but denies any other complaints.  Patient is complaining of urinating less often than usual.  Denies any chest pain or shortness of breath.  Some dry cough present.   Assessment & Plan:   Community-acquired pneumonia: Clinically responding.  Continue Rocephin and azithromycin  today. Chest physiotherapy, incentive spirometry, deep breathing exercises, sputum induction, mucolytic's and bronchodilators. Sputum cultures, blood cultures, Legionella and streptococcal antigen. Supplemental oxygen to keep saturations more than 90%. Mobilize with PT OT. Influenza A and B, RSV and COVID-19 negative.  Staphylococcus bacteremia: Likely coag negative bacteremia.  Wait for final cultures.  Type 2 diabetes with hyperglycemia: On insulin  at home.  Continued.  Hypomagnesemia: Replaced and adequate.  Hypokalemia: Replaced and adequate.  Sinus tachycardia: Appropriate to her fever.  Currently stable.  History of pulmonary embolism on Coumadin  therapy: Last pulmonary embolism 2017.  Patient has not tried any NOAC.  She is comfortable using Coumadin .   INR 1.9 today.  She was given Lovenox  injections.  Patient had hematoma  pockets in her abdomen due to previous Lovenox  injection.  Discontinue further injections.  Re-dose Coumadin  tonight.    DVT prophylaxis:  warfarin (COUMADIN ) tablet 5 mg   Code Status: Full code Family Communication: None at the bedside, Disposition Plan: Status is: Inpatient Remains inpatient appropriate because: IV antibiotics     Consultants:  None  Procedures:  None  Antimicrobials:  Rocephin azithromycin  5/20----     Objective: Vitals:   09/27/23 2328 09/28/23 0500 09/28/23 0813 09/28/23 1144  BP: 124/60 136/61 (!) 143/69 (!) 142/65  Pulse: 87 96 88 93  Resp: 16 20    Temp: 100 F (37.8 C) 100.2 F (37.9 C) 98.7 F (37.1 C)   TempSrc: Oral Oral Oral Oral  SpO2: 96% 92% 90% 96%  Weight:      Height:        Intake/Output Summary (Last 24 hours) at 09/28/2023 1313 Last data filed at 09/28/2023 1145 Gross per 24 hour  Intake 240 ml  Output 1200 ml  Net -960 ml   Filed Weights   09/25/23 1254 09/25/23 1956  Weight: (!) 158.8 kg (!) 161.9 kg    Examination:  General exam: Appears calm and comfortable.  On room air today. Respiratory system: Clear to auscultation. Respiratory effort normal.  No added sounds. Cardiovascular system: S1 & S2 heard, RRR. No JVD, murmurs, rubs, gallops or clicks. No pedal edema. Gastrointestinal system: Abdomen is nondistended, soft and nontender. No organomegaly or masses felt. Normal bowel sounds heard.  Obese and pendulous.  Nontender. Central nervous system: Alert and oriented. No focal neurological deficits. Extremities: Symmetric 5 x 5 power. Skin: No rashes, lesions or ulcers Psychiatry: Judgement and insight appear normal.  Mood & affect appropriate.     Data Reviewed: I have personally reviewed following labs and imaging studies  CBC: Recent Labs  Lab 09/25/23 1317 09/25/23 1411 09/26/23 1134 09/27/23 0349 09/28/23 0631  WBC 17.4*  --  13.4* 15.4* 13.1*  NEUTROABS 14.4*  --   --  11.0*  --   HGB 11.9* 10.9*  10.7* 10.0* 9.8*  HCT 38.0 32.0* 33.7* 32.1* 30.5*  MCV 86.6  --  86.6 87.7 85.4  PLT 306  --  285 289 334   Basic Metabolic Panel: Recent Labs  Lab 09/25/23 1317 09/25/23 1411 09/25/23 2010 09/26/23 0601 09/27/23 1115 09/28/23 0631  NA 136 134*  --  135 137 138  K 3.7 3.7 3.7 3.5 3.2* 3.7  CL 93*  --   --  100 101 104  CO2 28  --   --  24 25 24   GLUCOSE 191*  --   --  195* 172* 57*  BUN 22  --   --  19 18 16   CREATININE 1.24*  --   --  1.20* 1.17* 1.07*  CALCIUM 9.8  --   --  8.0* 7.9* 8.2*  MG 1.5*  --  1.7 2.0  --   --    GFR: Estimated Creatinine Clearance: 82.9 mL/min (A) (by C-G formula based on SCr of 1.07 mg/dL (H)). Liver Function Tests: Recent Labs  Lab 09/25/23 1317  AST 36  ALT 22  ALKPHOS 78  BILITOT 0.9  PROT 8.2*  ALBUMIN 3.9   Recent Labs  Lab 09/25/23 1317  LIPASE 58*   No results for input(s): "AMMONIA" in the last 168 hours. Coagulation Profile: Recent Labs  Lab 09/25/23 2010 09/26/23 0152 09/27/23 0349 09/28/23 0631  INR 1.4* 1.6* 1.9* 1.9*   Cardiac Enzymes: No results for input(s): "CKTOTAL", "CKMB", "CKMBINDEX", "TROPONINI" in the last 168 hours. BNP (last 3 results) No results for input(s): "PROBNP" in the last 8760 hours. HbA1C: Recent Labs    09/26/23 0152  HGBA1C 6.4*   CBG: Recent Labs  Lab 09/27/23 1111 09/27/23 1657 09/27/23 2148 09/28/23 0815 09/28/23 1142  GLUCAP 175* 142* 202* 102* 125*   Lipid Profile: No results for input(s): "CHOL", "HDL", "LDLCALC", "TRIG", "CHOLHDL", "LDLDIRECT" in the last 72 hours. Thyroid  Function Tests: No results for input(s): "TSH", "T4TOTAL", "FREET4", "T3FREE", "THYROIDAB" in the last 72 hours. Anemia Panel: No results for input(s): "VITAMINB12", "FOLATE", "FERRITIN", "TIBC", "IRON", "RETICCTPCT" in the last 72 hours. Sepsis Labs: Recent Labs  Lab 09/25/23 1326 09/25/23 1526 09/25/23 2347 09/26/23 0152 09/27/23 1115  PROCALCITON  --   --   --   --  0.30  LATICACIDVEN  1.6 1.7 1.0 0.9  --     Recent Results (from the past 240 hours)  Culture, blood (routine x 2)     Status: Abnormal (Preliminary result)   Collection Time: 09/25/23  1:34 PM   Specimen: BLOOD  Result Value Ref Range Status   Specimen Description   Final    BLOOD LEFT ANTECUBITAL Performed at [facility], [address], [place], [place] [address]    Special Requests   Final    BOTTLES DRAWN AEROBIC AND ANAEROBIC Blood Culture adequate volume Performed at [facility], [address] [place], [place] [address]    Culture  Setup Time   Final    GRAM POSITIVE COCCI ANAEROBIC BOTTLE ONLY CRITICAL RESULT CALLED TO, READ BACK BY AND VERIFIED WITH: PHARMD T. RUDISILL [id number] @ 1604 FH  Culture (A)  Final    STAPHYLOCOCCUS CAPITIS THE SIGNIFICANCE OF ISOLATING THIS ORGANISM FROM A SINGLE VENIPUNCTURE CANNOT BE PREDICTED WITHOUT FURTHER CLINICAL AND CULTURE CORRELATION. SUSCEPTIBILITIES AVAILABLE ONLY ON REQUEST. Performed at [facility], [address] [place], [place] [address]    Report Status PENDING  Incomplete  Blood Culture ID Panel (Reflexed)     Status: Abnormal   Collection Time: 09/25/23  1:34 PM  Result Value Ref Range Status   Enterococcus faecalis NOT DETECTED NOT DETECTED Final   Enterococcus Faecium NOT DETECTED NOT DETECTED Final   Listeria monocytogenes NOT DETECTED NOT DETECTED Final   Staphylococcus species DETECTED (A) NOT DETECTED Final    Comment: CRITICAL RESULT CALLED TO, READ BACK BY AND VERIFIED WITH: PHARMD T. RUDISILL [id number] @ 1604 FH    Staphylococcus aureus (BCID) NOT DETECTED NOT DETECTED Final   Staphylococcus epidermidis NOT DETECTED NOT DETECTED Final   Staphylococcus lugdunensis NOT DETECTED NOT DETECTED Final   Streptococcus species NOT DETECTED NOT DETECTED Final   Streptococcus agalactiae NOT DETECTED NOT DETECTED Final   Streptococcus pneumoniae NOT DETECTED NOT DETECTED Final   Streptococcus  pyogenes NOT DETECTED NOT DETECTED Final   A.calcoaceticus-baumannii NOT DETECTED NOT DETECTED Final   Bacteroides fragilis NOT DETECTED NOT DETECTED Final   Enterobacterales NOT DETECTED NOT DETECTED Final   Enterobacter cloacae complex NOT DETECTED NOT DETECTED Final   Escherichia coli NOT DETECTED NOT DETECTED Final   Klebsiella aerogenes NOT DETECTED NOT DETECTED Final   Klebsiella oxytoca NOT DETECTED NOT DETECTED Final   Klebsiella pneumoniae NOT DETECTED NOT DETECTED Final   Proteus species NOT DETECTED NOT DETECTED Final   Salmonella species NOT DETECTED NOT DETECTED Final   Serratia marcescens NOT DETECTED NOT DETECTED Final   Haemophilus influenzae NOT DETECTED NOT DETECTED Final   Neisseria meningitidis NOT DETECTED NOT DETECTED Final   Pseudomonas aeruginosa NOT DETECTED NOT DETECTED Final   Stenotrophomonas maltophilia NOT DETECTED NOT DETECTED Final   Candida albicans NOT DETECTED NOT DETECTED Final   Candida auris NOT DETECTED NOT DETECTED Final   Candida glabrata NOT DETECTED NOT DETECTED Final   Candida krusei NOT DETECTED NOT DETECTED Final   Candida parapsilosis NOT DETECTED NOT DETECTED Final   Candida tropicalis NOT DETECTED NOT DETECTED Final   Cryptococcus neoformans/gattii NOT DETECTED NOT DETECTED Final    Comment: Performed at [facility], [address] [place], [place] [address]  Resp panel by RT-PCR (RSV, Flu A&B, Covid) Anterior Nasal Swab     Status: None   Collection Time: 09/25/23 11:19 PM   Specimen: Anterior Nasal Swab  Result Value Ref Range Status   SARS Coronavirus 2 by RT PCR NEGATIVE NEGATIVE Final   Influenza A by PCR NEGATIVE NEGATIVE Final   Influenza B by PCR NEGATIVE NEGATIVE Final    Comment: (NOTE) The Xpert Xpress SARS-CoV-2/FLU/RSV plus assay is intended as an aid in the diagnosis of influenza from Nasopharyngeal swab specimens and should not be used as a sole basis for treatment. Nasal washings and aspirates are unacceptable  for Xpert Xpress SARS-CoV-2/FLU/RSV testing.  Fact Sheet for Patients: BloggerCourse.com  Fact Sheet for Healthcare Providers: SeriousBroker.it  This test is not yet approved or cleared by the [place]  FDA and has been authorized for detection and/or diagnosis of SARS-CoV-2 by FDA under an Emergency Use Authorization (EUA). This EUA will remain in effect (meaning this test can be used) for the duration of the COVID-19 declaration under Section 564(b)(1) of the  Act, 21 U.S.C. section 360bbb-3(b)(1), unless the authorization is terminated or revoked.     Resp Syncytial Virus by PCR NEGATIVE NEGATIVE Final    Comment: (NOTE) Fact Sheet for Patients: BloggerCourse.com  Fact Sheet for Healthcare Providers: SeriousBroker.it  This test is not yet approved or cleared by the [place]  FDA and has been authorized for detection and/or diagnosis of SARS-CoV-2 by FDA under an Emergency Use Authorization (EUA). This EUA will remain in effect (meaning this test can be used) for the duration of the COVID-19 declaration under Section 564(b)(1) of the Act, 21 U.S.C. section 360bbb-3(b)(1), unless the authorization is terminated or revoked.  Performed at [facility], [address] [place], [place] [address]          Radiology Studies: No results found.       Scheduled Meds:  amLODipine   10 mg Oral Daily   folic acid   1 mg Oral Daily   insulin  aspart  0-9 Units Subcutaneous TID WC   insulin  glargine-yfgn  75 Units Subcutaneous BID   potassium chloride  40 mEq Oral BID   pregabalin  50 mg Oral BID   sodium chloride  flush  10-40 mL Intracatheter Q12H   warfarin  5 mg Oral Q0600   Warfarin - Pharmacist Dosing Inpatient   Does not apply q1600   Continuous Infusions:  cefTRIAXone (ROCEPHIN)  IV 2 g (09/28/23 0633)     LOS: 3 days    Time spent: 52  minutes    Vada Garibaldi, MD [facility]

## 2023-09-28 NOTE — Progress Notes (Signed)
 Mobility Specialist Progress Note;   09/28/23 1011  Mobility  Activity Stood at bedside  Level of Assistance Minimal assist, patient does 75% or more  Assistive Device Front wheel walker  Activity Response Tolerated fair  Mobility Referral Yes  Mobility visit 1 Mobility  Mobility Specialist Start Time (ACUTE ONLY) 1011  Mobility Specialist Stop Time (ACUTE ONLY) 1032  Mobility Specialist Time Calculation (min) (ACUTE ONLY) 21 min   Pt agreeable to mobility. Required SV to come sitting on EoB. Attempted standing w/ pt, however very limited by L ankle pain. Pt stated she had fractured this ankle in the past. Requested pain meds, RN notified. Required MinA to assist pts BLE back into bed. Pt left comfortably in bed with all needs met, alarm on.   Janit Meline Mobility Specialist Please contact via SecureChat or [employer] [phone]

## 2023-09-28 NOTE — TOC Initial Note (Signed)
 Transition of Care [facility]) - Initial/Assessment Note    Patient Details  Name: Natasha Rollins MRN: [id number] Date of Birth: 05/16/1960  [facility] CM/SW Contact:    Cosimo Diones, RN Phone Number: 09/28/2023, 3:50 PM  Clinical Narrative: Patient presented for sepsis. PTA patient was from home with spouse. Patient states she has DME wheelchair and rolling walker. Patient is currently active outpatient with [facility]-office is asking for resumption aquatic Rx for the patient to return-MD is aware. No further home needs identified at this time.                  Expected Discharge Plan: Home/Self Care Barriers to Discharge: No Barriers Identified   Patient Goals and CMS Choice Patient states their goals for this hospitalization and ongoing recovery are:: plan to transition home once stable.   Choice offered to / list presented to : NA      Expected Discharge Plan and Services In-house Referral: NA Discharge Planning Services: CM Consult   Living arrangements for the past 2 months: Single Family Home                   DME Agency: NA                  Prior Living Arrangements/Services Living arrangements for the past 2 months: Single Family Home Lives with:: Spouse Patient language and need for interpreter reviewed:: Yes Do you feel safe going back to the place where you live?: Yes      Need for Family Participation in Patient Care: Yes (Comment) Care giver support system in place?: Yes (comment) Current home services: DME (rolling walker and cane) Criminal Activity/Legal Involvement Pertinent to Current Situation/Hospitalization: No - Comment as needed  Activities of Daily Living   ADL Screening (condition at time of admission) Independently performs ADLs?: No Does the patient have a NEW difficulty with bathing/dressing/toileting/self-feeding that is expected to last >3 days?: No Does the patient have a NEW  difficulty with getting in/out of bed, walking, or climbing stairs that is expected to last >3 days?: No Does the patient have a NEW difficulty with communication that is expected to last >3 days?: No Is the patient deaf or have difficulty hearing?: No Does the patient have difficulty seeing, even when wearing glasses/contacts?: No Does the patient have difficulty concentrating, remembering, or making decisions?: No  Permission Sought/Granted Permission sought to share information with : Family Supports, Case Manager                Emotional Assessment Appearance:: Appears stated age Attitude/Demeanor/Rapport: Engaged Affect (typically observed): Appropriate Orientation: : Oriented to Self, Oriented to Place, Oriented to  Time, Oriented to Situation Alcohol / Substance Use: Not Applicable Psych Involvement: No (comment)  Admission diagnosis:  Hypomagnesemia [E83.42] Acute cystitis with hematuria [N30.01] Sepsis due to pneumonia (HCC) [J18.9, A41.9] Community acquired pneumonia of left upper lobe of lung [J18.9] Nausea and vomiting, unspecified vomiting type [R11.2] Patient Active Problem List   Diagnosis Date Noted   Sepsis due to pneumonia (HCC) 09/25/2023   Morbid obesity (HCC) 09/25/2023   Chronic anticoagulation 09/25/2023   Hyperglycemia due to type 2 diabetes mellitus (HCC) 12/28/2020   Hyperlipidemia 12/28/2020   Non-toxic goiter 12/28/2020   Proteinuria 12/28/2020   Thyroid  nodule 12/28/2020   Pain in right hand 03/23/2020   Sprain of foot 06/27/2019   Synovial cyst of right popliteal space 05/15/2019   Spoon shaped nails 07/20/2018  Right second toe ulcer (HCC) 05/28/2018   Hidradenitis suppurativa 05/28/2018   Fat necrosis 05/28/2018   Osteoarthritis of knee 04/17/2018   MRSA (methicillin resistant staph aureus) culture positive 03/29/2018   DVT of lower extremity, bilateral (HCC) 02/19/2018   Pain in right foot 12/07/2017   Leukocytosis 06/21/2016    Pulmonary emboli (HCC) 06/20/2016   Pulmonary embolism (HCC) 06/20/2016   Diabetes (HCC) 12/22/2012   Hypertension 12/22/2012   Morbid obesity with BMI of 60.0-69.9, adult (HCC) 07/08/2010   OVARIAN CYST 07/08/2010   PCP:  Olin Bertin, MD Pharmacy:   [phone] [place], [place] - [address] AT CORNER OF [facility] [address] [place] [address] Phone: [phone] Fax: [phone]  [id number] [place], [place] - [address] AT [facility] OF [facility] [address] [facility] [address] [place] [address] Phone: [phone] Fax: [phone]  [id number] [place],  - [address] AT [facility] [address] [place] [address] Phone: [phone] Fax: [phone]  [facility] [address], [address] [place] [address] Phone: [phone] Fax: [phone]  [facility] [address] [place] [address] Phone: [phone] Fax: [phone]     Social Drivers of Health (SDOH) Social History: SDOH Screenings   Food Insecurity: No Food Insecurity (09/25/2023)  Housing: Low Risk  (09/25/2023)  Transportation Needs: No Transportation Needs (09/25/2023)  Utilities: Not At Risk (09/25/2023)  Depression (PHQ2-9): Low Risk  (07/18/2018)  Social Connections: Unknown (09/19/2021)   Received from [facility], [facility]  Tobacco Use: Low Risk  (09/25/2023)   SDOH Interventions:     Readmission Risk Interventions     No data to display

## 2023-09-28 NOTE — Progress Notes (Signed)
 PHARMACY - ANTICOAGULATION CONSULT NOTE  Pharmacy Consult for Warfarin + Lovenox  bridge Indication: history of PE/ BLE DVT 2019  Allergies  Allergen Reactions   Vitamin D     Other Reaction(s): Rash on face   Penicillins Itching and Rash   Saxagliptin Rash    Patient Measurements: Height: 5\' 4"  (162.6 cm) Weight: (!) 161.9 kg (356 lb 14.8 oz) IBW/kg (Calculated) : 54.7 HEPARIN  DW (KG): 96.4  Vital Signs: Temp: 98.7 F (37.1 C) (05/23 0813) Temp Source: Oral (05/23 0813) BP: 143/69 (05/23 0813) Pulse Rate: 88 (05/23 0813)  Labs: Recent Labs    09/26/23 0152 09/26/23 0601 09/26/23 1134 09/27/23 0349 09/27/23 1115 09/28/23 0631  HGB  --   --  10.7* 10.0*  --  9.8*  HCT  --   --  33.7* 32.1*  --  30.5*  PLT  --   --  285 289  --  334  LABPROT 19.4*  --   --  21.7*  --  22.2*  INR 1.6*  --   --  1.9*  --  1.9*  CREATININE  --  1.20*  --   --  1.17* 1.07*    Estimated Creatinine Clearance: 82.9 mL/min (A) (by C-G formula based on SCr of 1.07 mg/dL (H)).   Medical History: Past Medical History:  Diagnosis Date   Diabetes mellitus without complication (HCC)    Fat necrosis 05/28/2018   Hidradenitis suppurativa 05/28/2018   Hypertension    Morbid obesity (HCC)    Right second toe ulcer (HCC) 05/28/2018    Medications:  Medications Prior to Admission  Medication Sig Dispense Refill Last Dose/Taking   amLODipine  (NORVASC ) 10 MG tablet Take 10 mg by mouth daily.  8 09/23/2023   folic acid  (FOLVITE ) 1 MG tablet Take 1 mg by mouth daily.   09/23/2023   LEVEMIR  FLEXTOUCH 100 UNIT/ML Pen Inject 75 Units into the skin 2 (two) times daily.    09/23/2023   losartan-hydrochlorothiazide  (HYZAAR) 100-25 MG tablet Take 1 tablet by mouth daily.  4 09/23/2023   NOVOLOG  FLEXPEN 100 UNIT/ML FlexPen Inject 30 Units into the skin 3 (three) times daily with meals.    09/23/2023   pregabalin  (LYRICA ) 50 MG capsule pregabalin  50 mg capsule  TAKE 1 CAPSULE BY MOUTH TWICE A DAY   09/23/2023    rosuvastatin (CRESTOR) 10 MG tablet rosuvastatin 10 mg tablet  TAKE 1 TABLET BY MOUTH FOR 3 DAYS A WEEK   09/23/2023   Vitamin D, Ergocalciferol, (DRISDOL) 1.25 MG (50000 UNIT) CAPS capsule Take 50,000 Units by mouth once a week.   09/23/2023   warfarin (COUMADIN ) 5 MG tablet Take 1 tablet (5 mg total) by mouth daily at 6 PM. 30 tablet 0 09/23/2023   allopurinol  (ZYLOPRIM ) 100 MG tablet Take 1 tablet (100 mg total) by mouth daily. (Patient not taking: Reported on 09/25/2023) 30 tablet 6 Not Taking   azithromycin  (ZITHROMAX ) 250 MG tablet Take 1 tablet (250 mg total) by mouth daily. Take first 2 tablets together, then 1 every day until finished. (Patient not taking: Reported on 09/25/2023) 6 tablet 0 Not Taking   B-D UF III MINI PEN NEEDLES 31G X 5 MM MISC USE AS DIRECTED 5 TIMES A DAY      benzonatate  (TESSALON ) 100 MG capsule Take 1 capsule (100 mg total) by mouth every 8 (eight) hours. (Patient not taking: Reported on 09/25/2023) 15 capsule 0 Not Taking   bisacodyl  (DULCOLAX) 5 MG EC tablet Take 4 tablets at  3:00 pm on 04/05/22 (Patient not taking: Reported on 09/25/2023) 4 tablet 0 Not Taking   Blood Glucose Monitoring Suppl (FREESTYLE FREEDOM LITE) w/Device KIT See admin instructions.      colchicine  0.6 MG tablet Take 1 tablet (0.6 mg total) by mouth daily. (Patient not taking: Reported on 09/25/2023) 10 tablet 0 Not Taking   gabapentin  (NEURONTIN ) 300 MG capsule Take 1 capsule (300 mg total) by mouth at bedtime. (Patient not taking: Reported on 09/25/2023) 90 capsule 0 Not Taking   Lancets (ONETOUCH DELICA PLUS LANCET30G) MISC USE AS DIRECTED ONCE A DAY.      ONETOUCH VERIO test strip       polyethylene glycol (COLYTE  WITH FLAVOR PACKS) 240 g solution Take as a split dose following doctor instructions (Patient not taking: Reported on 09/25/2023) 4000 mL 0 Not Taking    Assessment: Pharmacy consulted to dose Warfarin for h/o PE /BLE DVT (Feb 2018), not a DOAC candidate given body habitus (weight 161 kg,  BMI 61).  -PTA warfarin regimen = 5mg  daily -INR 1.4 (subtherapeutic) on admission -Notable DDI's = azithromycin  (stopped) and ceftriaxone (5/20 > c) -- increases INR/bleed risk  5/21: INR 1.9, remains subtherapeutic but appropriately trending up after starting warfarin 5/20 PM.   Goal of Therapy:  INR 2-3 Monitor platelets by anticoagulation protocol: Yes   Plan:  -Warfarin 5mg  po daily -Daily PT/INR  Baxter Limber, PharmD Clinical Pharmacist **Pharmacist phone directory can now be found on [email or web address] ([email or web address]).  Listed under [facility].

## 2023-09-29 DIAGNOSIS — J189 Pneumonia, unspecified organism: Secondary | ICD-10-CM | POA: Diagnosis not present

## 2023-09-29 DIAGNOSIS — A419 Sepsis, unspecified organism: Secondary | ICD-10-CM | POA: Diagnosis not present

## 2023-09-29 LAB — BASIC METABOLIC PANEL WITH GFR
Anion gap: 7 (ref 5–15)
BUN: 15 mg/dL (ref 8–23)
CO2: 24 mmol/L (ref 22–32)
Calcium: 8.1 mg/dL — ABNORMAL LOW (ref 8.9–10.3)
Chloride: 105 mmol/L (ref 98–111)
Creatinine, Ser: 1.05 mg/dL — ABNORMAL HIGH (ref 0.44–1.00)
GFR, Estimated: 60 mL/min — ABNORMAL LOW (ref >60)
Glucose, Bld: 109 mg/dL — ABNORMAL HIGH (ref 70–99)
Potassium: 4.5 mmol/L (ref 3.5–5.1)
Sodium: 136 mmol/L (ref 135–145)

## 2023-09-29 LAB — GLUCOSE, CAPILLARY
Glucose-Capillary: 67 mg/dL — ABNORMAL LOW (ref 70–99)
Glucose-Capillary: 69 mg/dL — ABNORMAL LOW (ref 70–99)
Glucose-Capillary: 172 mg/dL — ABNORMAL HIGH (ref 70–99)
Glucose-Capillary: 110 mg/dL — ABNORMAL HIGH (ref 70–99)
Glucose-Capillary: 152 mg/dL — ABNORMAL HIGH (ref 70–99)
Glucose-Capillary: 253 mg/dL — ABNORMAL HIGH (ref 70–99)

## 2023-09-29 LAB — CBC
HCT: 30.3 % — ABNORMAL LOW (ref 36.0–46.0)
Hemoglobin: 9.3 g/dL — ABNORMAL LOW (ref 12.0–15.0)
MCH: 26.6 pg (ref 26.0–34.0)
MCHC: 30.7 g/dL (ref 30.0–36.0)
MCV: 86.8 fL (ref 80.0–100.0)
Platelets: 311 10*3/uL (ref 150–400)
RBC: 3.49 MIL/uL — ABNORMAL LOW (ref 3.87–5.11)
RDW: 15.4 % (ref 11.5–15.5)
WBC: 14.9 10*3/uL — ABNORMAL HIGH (ref 4.0–10.5)
nRBC: 0 % (ref 0.0–0.2)

## 2023-09-29 LAB — CULTURE, BLOOD (ROUTINE X 2): Special Requests: ADEQUATE

## 2023-09-29 LAB — PROTIME-INR
INR: 2.1 — ABNORMAL HIGH (ref 0.8–1.2)
Prothrombin Time: 24 s — ABNORMAL HIGH (ref 11.4–15.2)

## 2023-09-29 LAB — STREP PNEUMONIAE URINARY ANTIGEN: Strep Pneumo Urinary Antigen: NEGATIVE

## 2023-09-29 MED ORDER — LEVOFLOXACIN 750 MG PO TABS
750.0000 mg | ORAL_TABLET | Freq: Every day | ORAL | Status: DC
  Administered 2023-09-29 – 2023-09-30 (×2): 750 mg via ORAL
  Filled 2023-09-29 (×2): qty 1

## 2023-09-29 NOTE — Progress Notes (Signed)
 PHARMACY - ANTICOAGULATION CONSULT NOTE  Pharmacy Consult for Warfarin  Indication: history of PE/ BLE DVT 2019  Allergies  Allergen Reactions   Vitamin D     Other Reaction(s): Rash on face   Penicillins Itching and Rash   Saxagliptin Rash    Patient Measurements: Height: 5\' 4"  (162.6 cm) Weight: (!) 161.9 kg (356 lb 14.8 oz) IBW/kg (Calculated) : 54.7 HEPARIN  DW (KG): 96.4  Vital Signs: Temp: 99.2 F (37.3 C) (05/24 0442) Temp Source: Oral (05/24 0442) BP: 134/70 (05/24 0318) Pulse Rate: 90 (05/24 0442)  Labs: Recent Labs    09/27/23 0349 09/27/23 1115 09/28/23 0631 09/29/23 0443  HGB 10.0*  --  9.8* 9.3*  HCT 32.1*  --  30.5* 30.3*  PLT 289  --  334 311  LABPROT 21.7*  --  22.2* 24.0*  INR 1.9*  --  1.9* 2.1*  CREATININE  --  1.17* 1.07* 1.05*    Estimated Creatinine Clearance: 84.5 mL/min (A) (by C-G formula based on SCr of 1.05 mg/dL (H)).   Medical History: Past Medical History:  Diagnosis Date   Diabetes mellitus without complication (HCC)    Fat necrosis 05/28/2018   Hidradenitis suppurativa 05/28/2018   Hypertension    Morbid obesity (HCC)    Right second toe ulcer (HCC) 05/28/2018    Medications:  Medications Prior to Admission  Medication Sig Dispense Refill Last Dose/Taking   amLODipine  (NORVASC ) 10 MG tablet Take 10 mg by mouth daily.  8 09/23/2023   folic acid  (FOLVITE ) 1 MG tablet Take 1 mg by mouth daily.   09/23/2023   LEVEMIR  FLEXTOUCH 100 UNIT/ML Pen Inject 75 Units into the skin 2 (two) times daily.    09/23/2023   losartan-hydrochlorothiazide  (HYZAAR) 100-25 MG tablet Take 1 tablet by mouth daily.  4 09/23/2023   NOVOLOG  FLEXPEN 100 UNIT/ML FlexPen Inject 30 Units into the skin 3 (three) times daily with meals.    09/23/2023   pregabalin (LYRICA) 50 MG capsule pregabalin 50 mg capsule  TAKE 1 CAPSULE BY MOUTH TWICE A DAY   09/23/2023   rosuvastatin (CRESTOR) 10 MG tablet rosuvastatin 10 mg tablet  TAKE 1 TABLET BY MOUTH FOR 3 DAYS A WEEK    09/23/2023   Vitamin D, Ergocalciferol, (DRISDOL) 1.25 MG (50000 UNIT) CAPS capsule Take 50,000 Units by mouth once a week.   09/23/2023   warfarin (COUMADIN ) 5 MG tablet Take 1 tablet (5 mg total) by mouth daily at 6 PM. 30 tablet 0 09/23/2023   allopurinol  (ZYLOPRIM ) 100 MG tablet Take 1 tablet (100 mg total) by mouth daily. (Patient not taking: Reported on 09/25/2023) 30 tablet 6 Not Taking   azithromycin  (ZITHROMAX ) 250 MG tablet Take 1 tablet (250 mg total) by mouth daily. Take first 2 tablets together, then 1 every day until finished. (Patient not taking: Reported on 09/25/2023) 6 tablet 0 Not Taking   B-D UF III MINI PEN NEEDLES 31G X 5 MM MISC USE AS DIRECTED 5 TIMES A DAY      benzonatate  (TESSALON ) 100 MG capsule Take 1 capsule (100 mg total) by mouth every 8 (eight) hours. (Patient not taking: Reported on 09/25/2023) 15 capsule 0 Not Taking   bisacodyl  (DULCOLAX) 5 MG EC tablet Take 4 tablets at 3:00 pm on 04/05/22 (Patient not taking: Reported on 09/25/2023) 4 tablet 0 Not Taking   Blood Glucose Monitoring Suppl (FREESTYLE FREEDOM LITE) w/Device KIT See admin instructions.      colchicine  0.6 MG tablet Take 1 tablet (0.6  mg total) by mouth daily. (Patient not taking: Reported on 09/25/2023) 10 tablet 0 Not Taking   gabapentin  (NEURONTIN ) 300 MG capsule Take 1 capsule (300 mg total) by mouth at bedtime. (Patient not taking: Reported on 09/25/2023) 90 capsule 0 Not Taking   Lancets (ONETOUCH DELICA PLUS LANCET30G) MISC USE AS DIRECTED ONCE A DAY.      ONETOUCH VERIO test strip       polyethylene glycol (COLYTE  WITH FLAVOR PACKS) 240 g solution Take as a split dose following doctor instructions (Patient not taking: Reported on 09/25/2023) 4000 mL 0 Not Taking    Assessment: Pharmacy consulted to dose Warfarin for h/o PE /BLE DVT (Feb 2018), not a DOAC candidate given body habitus (weight 161 kg, BMI 61).  -PTA warfarin regimen = 5mg  daily -INR 1.4 (subtherapeutic) on admission -Notable DDI's =  azithromycin  (stopped) and ceftriaxone (5/20-5/24) -- increases INR/bleed risk  5/24: INR therapeutic at 2.1 on 5 mg daily. Hgb 9.3, plt 311 and no signs of bleeding noted.   Goal of Therapy:  INR 2-3 Monitor platelets by anticoagulation protocol: Yes   Plan:  -Warfarin 5mg  po daily -Daily PT/INR  Thank you for involving pharmacy in the patient's care.   Barbra Boone, PharmD PGY1 Acute Care Pharmacy Resident  09/29/2023 6:48 AM

## 2023-09-29 NOTE — Progress Notes (Signed)
 PROGRESS NOTE    Natasha Rollins  JYN:[id number] DOB: 19-Sep-1960 DOA: 09/25/2023 PCP: Olin Bertin, MD    Brief Narrative:  63 year old with history of pulmonary embolism on Coumadin , type 2 diabetes, morbid obesity presented to primary care office with about 1 day of nausea, dizziness and not feeling well.  She also had 1 episode of vomiting.  She was very sleepy at the office so they sent her to the ER.  In the emergency room, hemodynamically stable.  Initially tachycardic.  CT scan abdomen pelvis with left upper lobe infiltrate.  Temperature 101.6 and sinus tachycardia with heart rate up to 150.  Blood pressure stable.  Admitted with IV fluids, IV antibiotics.  Subjective:  Patient seen and examined.  She overall feels better however continues to have intermittent fever.  Temperature 101 yesterday evening. Correspondent tachycardia on the monitor. She is now urinating well.   Assessment & Plan:   Community-acquired pneumonia: Some clinical improvement, however continues to have fever despite 5 days of Rocephin and azithromycin .  Blood cultures with coag negative Staphylococcus.   Strep antigen negative.   Legionella pending.   Influenza A and B, RSV and COVID-19 negative. Due to persistent fever, will try changing antibiotics to Levaquin . If patient spikes fever again, will do blood cultures.  Staphylococcus bacteremia: Likely coag negative bacteremia.  No indication to intervene.  Type 2 diabetes with hyperglycemia: On insulin  at home.  Continued.  Hypomagnesemia: Replaced and adequate.  Hypokalemia: Replaced and adequate.  Sinus tachycardia: Appropriate to her fever.  Currently stable.  History of pulmonary embolism on Coumadin  therapy: Coumadin  redosing.  INR 2.1 today.  DVT prophylaxis:  warfarin (COUMADIN ) tablet 5 mg   Code Status: Full code Family Communication: None at the bedside, Disposition Plan: Status is: Inpatient Remains inpatient appropriate  because: Persistent fever.     Consultants:  None  Procedures:  None  Antimicrobials:  Rocephin azithromycin  5/20----5/24 Levaquin  5/24---     Objective: Vitals:   09/29/23 0738 09/29/23 0741 09/29/23 0742 09/29/23 0743  BP: 126/63     Pulse: 92     Resp: 16     Temp: 99.4 F (37.4 C)     TempSrc: Oral     SpO2: 98% 99% 99% 100%  Weight:      Height:        Intake/Output Summary (Last 24 hours) at 09/29/2023 1138 Last data filed at 09/29/2023 0738 Gross per 24 hour  Intake 1058 ml  Output 1450 ml  Net -392 ml   Filed Weights   09/25/23 1254 09/25/23 1956  Weight: (!) 158.8 kg (!) 161.9 kg    Examination:  General exam: Appears calm and comfortable.  Pleasant interactive.  On room air. Respiratory system: Clear to auscultation. Respiratory effort normal.  No added sounds. Cardiovascular system: S1 & S2 heard, RRR. No JVD, murmurs, rubs, gallops or clicks. No pedal edema. Gastrointestinal system: Abdomen is nondistended, soft and nontender. No organomegaly or masses felt. Normal bowel sounds heard.  Obese and pendulous.  Nontender. Central nervous system: Alert and oriented. No focal neurological deficits. Extremities: Symmetric 5 x 5 power. Skin: No rashes, lesions or ulcers Psychiatry: Judgement and insight appear normal. Mood & affect appropriate.     Data Reviewed: I have personally reviewed following labs and imaging studies  CBC: Recent Labs  Lab 09/25/23 1317 09/25/23 1411 09/26/23 1134 09/27/23 0349 09/28/23 0631 09/29/23 0443  WBC 17.4*  --  13.4* 15.4* 13.1* 14.9*  NEUTROABS 14.4*  --   --  11.0*  --   --   HGB 11.9* 10.9* 10.7* 10.0* 9.8* 9.3*  HCT 38.0 32.0* 33.7* 32.1* 30.5* 30.3*  MCV 86.6  --  86.6 87.7 85.4 86.8  PLT 306  --  285 289 334 311   Basic Metabolic Panel: Recent Labs  Lab 09/25/23 1317 09/25/23 1411 09/25/23 2010 09/26/23 0601 09/27/23 1115 09/28/23 0631 09/29/23 0443  NA 136 134*  --  135 137 138 136  K 3.7  3.7 3.7 3.5 3.2* 3.7 4.5  CL 93*  --   --  100 101 104 105  CO2 28  --   --  24 25 24 24   GLUCOSE 191*  --   --  195* 172* 57* 109*  BUN 22  --   --  19 18 16 15   CREATININE 1.24*  --   --  1.20* 1.17* 1.07* 1.05*  CALCIUM 9.8  --   --  8.0* 7.9* 8.2* 8.1*  MG 1.5*  --  1.7 2.0  --   --   --    GFR: Estimated Creatinine Clearance: 84.5 mL/min (A) (by C-G formula based on SCr of 1.05 mg/dL (H)). Liver Function Tests: Recent Labs  Lab 09/25/23 1317  AST 36  ALT 22  ALKPHOS 78  BILITOT 0.9  PROT 8.2*  ALBUMIN 3.9   Recent Labs  Lab 09/25/23 1317  LIPASE 58*   No results for input(s): "AMMONIA" in the last 168 hours. Coagulation Profile: Recent Labs  Lab 09/25/23 2010 09/26/23 0152 09/27/23 0349 09/28/23 0631 09/29/23 0443  INR 1.4* 1.6* 1.9* 1.9* 2.1*   Cardiac Enzymes: No results for input(s): "CKTOTAL", "CKMB", "CKMBINDEX", "TROPONINI" in the last 168 hours. BNP (last 3 results) No results for input(s): "PROBNP" in the last 8760 hours. HbA1C: No results for input(s): "HGBA1C" in the last 72 hours.  CBG: Recent Labs  Lab 09/28/23 1703 09/28/23 2055 09/29/23 0735 09/29/23 0755 09/29/23 0907  GLUCAP 171* 164* 67* 69* 172*   Lipid Profile: No results for input(s): "CHOL", "HDL", "LDLCALC", "TRIG", "CHOLHDL", "LDLDIRECT" in the last 72 hours. Thyroid  Function Tests: No results for input(s): "TSH", "T4TOTAL", "FREET4", "T3FREE", "THYROIDAB" in the last 72 hours. Anemia Panel: No results for input(s): "VITAMINB12", "FOLATE", "FERRITIN", "TIBC", "IRON", "RETICCTPCT" in the last 72 hours. Sepsis Labs: Recent Labs  Lab 09/25/23 1326 09/25/23 1526 09/25/23 2347 09/26/23 0152 09/27/23 1115  PROCALCITON  --   --   --   --  0.30  LATICACIDVEN 1.6 1.7 1.0 0.9  --     Recent Results (from the past 240 hours)  Culture, blood (routine x 2)     Status: Abnormal   Collection Time: 09/25/23  1:34 PM   Specimen: BLOOD  Result Value Ref Range Status   Specimen  Description   Final    BLOOD LEFT ANTECUBITAL Performed at [facility], [address], [place], [place] [address]    Special Requests   Final    BOTTLES DRAWN AEROBIC AND ANAEROBIC Blood Culture adequate volume Performed at [facility], [address] [place], [place] [address]    Culture  Setup Time   Final    GRAM POSITIVE COCCI ANAEROBIC BOTTLE ONLY CRITICAL RESULT CALLED TO, READ BACK BY AND VERIFIED WITH: PHARMD T. RUDISILL [id number] @ 1604 FH    Culture (A)  Final    STAPHYLOCOCCUS CAPITIS THE SIGNIFICANCE OF ISOLATING THIS ORGANISM FROM A SINGLE VENIPUNCTURE CANNOT BE PREDICTED WITHOUT FURTHER CLINICAL AND CULTURE CORRELATION. SUSCEPTIBILITIES AVAILABLE ONLY  ON REQUEST. Performed at [facility], [address] [place], [place] [address]    Report Status 09/29/2023 FINAL  Final  Blood Culture ID Panel (Reflexed)     Status: Abnormal   Collection Time: 09/25/23  1:34 PM  Result Value Ref Range Status   Enterococcus faecalis NOT DETECTED NOT DETECTED Final   Enterococcus Faecium NOT DETECTED NOT DETECTED Final   Listeria monocytogenes NOT DETECTED NOT DETECTED Final   Staphylococcus species DETECTED (A) NOT DETECTED Final    Comment: CRITICAL RESULT CALLED TO, READ BACK BY AND VERIFIED WITH: PHARMD T. RUDISILL [id number] @ 1604 FH    Staphylococcus aureus (BCID) NOT DETECTED NOT DETECTED Final   Staphylococcus epidermidis NOT DETECTED NOT DETECTED Final   Staphylococcus lugdunensis NOT DETECTED NOT DETECTED Final   Streptococcus species NOT DETECTED NOT DETECTED Final   Streptococcus agalactiae NOT DETECTED NOT DETECTED Final   Streptococcus pneumoniae NOT DETECTED NOT DETECTED Final   Streptococcus pyogenes NOT DETECTED NOT DETECTED Final   A.calcoaceticus-baumannii NOT DETECTED NOT DETECTED Final   Bacteroides fragilis NOT DETECTED NOT DETECTED Final   Enterobacterales NOT DETECTED NOT DETECTED Final   Enterobacter cloacae  complex NOT DETECTED NOT DETECTED Final   Escherichia coli NOT DETECTED NOT DETECTED Final   Klebsiella aerogenes NOT DETECTED NOT DETECTED Final   Klebsiella oxytoca NOT DETECTED NOT DETECTED Final   Klebsiella pneumoniae NOT DETECTED NOT DETECTED Final   Proteus species NOT DETECTED NOT DETECTED Final   Salmonella species NOT DETECTED NOT DETECTED Final   Serratia marcescens NOT DETECTED NOT DETECTED Final   Haemophilus influenzae NOT DETECTED NOT DETECTED Final   Neisseria meningitidis NOT DETECTED NOT DETECTED Final   Pseudomonas aeruginosa NOT DETECTED NOT DETECTED Final   Stenotrophomonas maltophilia NOT DETECTED NOT DETECTED Final   Candida albicans NOT DETECTED NOT DETECTED Final   Candida auris NOT DETECTED NOT DETECTED Final   Candida glabrata NOT DETECTED NOT DETECTED Final   Candida krusei NOT DETECTED NOT DETECTED Final   Candida parapsilosis NOT DETECTED NOT DETECTED Final   Candida tropicalis NOT DETECTED NOT DETECTED Final   Cryptococcus neoformans/gattii NOT DETECTED NOT DETECTED Final    Comment: Performed at [facility], [address] [place], [place] [address]  Resp panel by RT-PCR (RSV, Flu A&B, Covid) Anterior Nasal Swab     Status: None   Collection Time: 09/25/23 11:19 PM   Specimen: Anterior Nasal Swab  Result Value Ref Range Status   SARS Coronavirus 2 by RT PCR NEGATIVE NEGATIVE Final   Influenza A by PCR NEGATIVE NEGATIVE Final   Influenza B by PCR NEGATIVE NEGATIVE Final    Comment: (NOTE) The Xpert Xpress SARS-CoV-2/FLU/RSV plus assay is intended as an aid in the diagnosis of influenza from Nasopharyngeal swab specimens and should not be used as a sole basis for treatment. Nasal washings and aspirates are unacceptable for Xpert Xpress SARS-CoV-2/FLU/RSV testing.  Fact Sheet for Patients: BloggerCourse.com  Fact Sheet for Healthcare Providers: SeriousBroker.it  This test is not yet approved  or cleared by the [place]  FDA and has been authorized for detection and/or diagnosis of SARS-CoV-2 by FDA under an Emergency Use Authorization (EUA). This EUA will remain in effect (meaning this test can be used) for the duration of the COVID-19 declaration under Section 564(b)(1) of the Act, 21 U.S.C. section 360bbb-3(b)(1), unless the authorization is terminated or revoked.     Resp Syncytial Virus by PCR NEGATIVE NEGATIVE Final    Comment: (NOTE) Fact  Sheet for Patients: BloggerCourse.com  Fact Sheet for Healthcare Providers: SeriousBroker.it  This test is not yet approved or cleared by the [place]  FDA and has been authorized for detection and/or diagnosis of SARS-CoV-2 by FDA under an Emergency Use Authorization (EUA). This EUA will remain in effect (meaning this test can be used) for the duration of the COVID-19 declaration under Section 564(b)(1) of the Act, 21 U.S.C. section 360bbb-3(b)(1), unless the authorization is terminated or revoked.  Performed at [facility], [address], [place], [place] [address]          Radiology Studies: No results found.       Scheduled Meds:  amLODipine   10 mg Oral Daily   folic acid   1 mg Oral Daily   insulin  aspart  0-9 Units Subcutaneous TID WC   insulin  glargine-yfgn  75 Units Subcutaneous BID   levofloxacin   750 mg Oral Daily   pregabalin  50 mg Oral BID   sodium chloride  flush  10-40 mL Intracatheter Q12H   warfarin  5 mg Oral Q0600   Warfarin - Pharmacist Dosing Inpatient   Does not apply q1600   Continuous Infusions:     LOS: 4 days    Time spent: 40 minutes    Vada Garibaldi, MD [facility]

## 2023-09-30 DIAGNOSIS — A419 Sepsis, unspecified organism: Secondary | ICD-10-CM | POA: Diagnosis not present

## 2023-09-30 DIAGNOSIS — J189 Pneumonia, unspecified organism: Secondary | ICD-10-CM | POA: Diagnosis not present

## 2023-09-30 LAB — CBC
HCT: 27 % — ABNORMAL LOW (ref 36.0–46.0)
Hemoglobin: 8.4 g/dL — ABNORMAL LOW (ref 12.0–15.0)
MCH: 27.3 pg (ref 26.0–34.0)
MCHC: 31.1 g/dL (ref 30.0–36.0)
MCV: 87.7 fL (ref 80.0–100.0)
Platelets: 307 10*3/uL (ref 150–400)
RBC: 3.08 MIL/uL — ABNORMAL LOW (ref 3.87–5.11)
RDW: 15.5 % (ref 11.5–15.5)
WBC: 15.4 10*3/uL — ABNORMAL HIGH (ref 4.0–10.5)
nRBC: 0 % (ref 0.0–0.2)

## 2023-09-30 LAB — LEGIONELLA PNEUMOPHILA SEROGP 1 UR AG: L. pneumophila Serogp 1 Ur Ag: NEGATIVE

## 2023-09-30 LAB — PROTIME-INR
INR: 2.4 — ABNORMAL HIGH (ref 0.8–1.2)
Prothrombin Time: 26.1 s — ABNORMAL HIGH (ref 11.4–15.2)

## 2023-09-30 LAB — BASIC METABOLIC PANEL WITH GFR
Anion gap: 8 (ref 5–15)
BUN: 11 mg/dL (ref 8–23)
CO2: 22 mmol/L (ref 22–32)
Calcium: 8 mg/dL — ABNORMAL LOW (ref 8.9–10.3)
Chloride: 104 mmol/L (ref 98–111)
Creatinine, Ser: 0.88 mg/dL (ref 0.44–1.00)
GFR, Estimated: >60 mL/min (ref >60)
Glucose, Bld: 151 mg/dL — ABNORMAL HIGH (ref 70–99)
Potassium: 4.6 mmol/L (ref 3.5–5.1)
Sodium: 134 mmol/L — ABNORMAL LOW (ref 135–145)

## 2023-09-30 LAB — GLUCOSE, CAPILLARY: Glucose-Capillary: 73 mg/dL (ref 70–99)

## 2023-09-30 MED ORDER — LEVOFLOXACIN 750 MG PO TABS: 750.0000 mg | ORAL_TABLET | Freq: Every day | ORAL | 0 refills | Status: AC

## 2023-09-30 NOTE — Progress Notes (Signed)
 PHARMACY - ANTICOAGULATION CONSULT NOTE  Pharmacy Consult for Warfarin  Indication: history of PE/ BLE DVT 2019  Allergies  Allergen Reactions   Vitamin D     Other Reaction(s): Rash on face   Penicillins Itching and Rash   Saxagliptin Rash    Patient Measurements: Height: 5\' 4"  (162.6 cm) Weight: (!) 161.9 kg (356 lb 14.8 oz) IBW/kg (Calculated) : 54.7 HEPARIN  DW (KG): 96.4  Vital Signs: Temp: 98.8 F (37.1 C) (05/25 0430) Temp Source: Oral (05/25 0430) BP: 145/68 (05/25 0430) Pulse Rate: 88 (05/25 0430)  Labs: Recent Labs    09/28/23 0631 09/29/23 0443 09/30/23 0230  HGB 9.8* 9.3* 8.4*  HCT 30.5* 30.3* 27.0*  PLT 334 311 307  LABPROT 22.2* 24.0* 26.1*  INR 1.9* 2.1* 2.4*  CREATININE 1.07* 1.05* 0.88    Estimated Creatinine Clearance: 100.8 mL/min (by C-G formula based on SCr of 0.88 mg/dL).   Medical History: Past Medical History:  Diagnosis Date   Diabetes mellitus without complication (HCC)    Fat necrosis 05/28/2018   Hidradenitis suppurativa 05/28/2018   Hypertension    Morbid obesity (HCC)    Right second toe ulcer (HCC) 05/28/2018    Medications:  Medications Prior to Admission  Medication Sig Dispense Refill Last Dose/Taking   amLODipine  (NORVASC ) 10 MG tablet Take 10 mg by mouth daily.  8 09/23/2023   folic acid  (FOLVITE ) 1 MG tablet Take 1 mg by mouth daily.   09/23/2023   LEVEMIR  FLEXTOUCH 100 UNIT/ML Pen Inject 75 Units into the skin 2 (two) times daily.    09/23/2023   losartan-hydrochlorothiazide  (HYZAAR) 100-25 MG tablet Take 1 tablet by mouth daily.  4 09/23/2023   NOVOLOG  FLEXPEN 100 UNIT/ML FlexPen Inject 30 Units into the skin 3 (three) times daily with meals.    09/23/2023   pregabalin (LYRICA) 50 MG capsule pregabalin 50 mg capsule  TAKE 1 CAPSULE BY MOUTH TWICE A DAY   09/23/2023   rosuvastatin (CRESTOR) 10 MG tablet rosuvastatin 10 mg tablet  TAKE 1 TABLET BY MOUTH FOR 3 DAYS A WEEK   09/23/2023   Vitamin D, Ergocalciferol, (DRISDOL)  1.25 MG (50000 UNIT) CAPS capsule Take 50,000 Units by mouth once a week.   09/23/2023   warfarin (COUMADIN ) 5 MG tablet Take 1 tablet (5 mg total) by mouth daily at 6 PM. 30 tablet 0 09/23/2023   allopurinol  (ZYLOPRIM ) 100 MG tablet Take 1 tablet (100 mg total) by mouth daily. (Patient not taking: Reported on 09/25/2023) 30 tablet 6 Not Taking   azithromycin  (ZITHROMAX ) 250 MG tablet Take 1 tablet (250 mg total) by mouth daily. Take first 2 tablets together, then 1 every day until finished. (Patient not taking: Reported on 09/25/2023) 6 tablet 0 Not Taking   B-D UF III MINI PEN NEEDLES 31G X 5 MM MISC USE AS DIRECTED 5 TIMES A DAY      benzonatate  (TESSALON ) 100 MG capsule Take 1 capsule (100 mg total) by mouth every 8 (eight) hours. (Patient not taking: Reported on 09/25/2023) 15 capsule 0 Not Taking   bisacodyl  (DULCOLAX) 5 MG EC tablet Take 4 tablets at 3:00 pm on 04/05/22 (Patient not taking: Reported on 09/25/2023) 4 tablet 0 Not Taking   Blood Glucose Monitoring Suppl (FREESTYLE FREEDOM LITE) w/Device KIT See admin instructions.      colchicine  0.6 MG tablet Take 1 tablet (0.6 mg total) by mouth daily. (Patient not taking: Reported on 09/25/2023) 10 tablet 0 Not Taking   gabapentin  (NEURONTIN ) 300 MG  capsule Take 1 capsule (300 mg total) by mouth at bedtime. (Patient not taking: Reported on 09/25/2023) 90 capsule 0 Not Taking   Lancets (ONETOUCH DELICA PLUS LANCET30G) MISC USE AS DIRECTED ONCE A DAY.      ONETOUCH VERIO test strip       polyethylene glycol (COLYTE  WITH FLAVOR PACKS) 240 g solution Take as a split dose following doctor instructions (Patient not taking: Reported on 09/25/2023) 4000 mL 0 Not Taking    Assessment: Pharmacy consulted to dose Warfarin for h/o PE /BLE DVT (Feb 2018), not a DOAC candidate given body habitus (weight 161 kg, BMI 61).  -PTA warfarin regimen = 5mg  daily -INR 1.4 (subtherapeutic) on admission -Notable DDI's = azithromycin  (stopped) and ceftriaxone (5/20-5/24) >  levofloxacin  (5/34 >> -- increases INR/bleed risk  INR therapeutic at 2.4 on home dose of 5 mg daily. Notably patient was transitioned to levofloxacin  on 5/24 which may interact with warfarin and increase INR and bleed risk. Hgb 8.4, plt wnl. No signs of bleeding noted.   Goal of Therapy:  INR 2-3 Monitor platelets by anticoagulation protocol: Yes   Plan:  -Warfarin 5mg  po daily -Daily PT/INR -Monitor H&H and for signs of bleeding  Thank you for involving pharmacy in the patient's care.   Barbra Boone, PharmD PGY1 Acute Care Pharmacy Resident  09/30/2023 6:48 AM

## 2023-09-30 NOTE — Discharge Summary (Signed)
 Physician Discharge Summary  Natasha Rollins BJY:[id number] DOB: [birth year] DOA: 09/25/2023  PCP: Olin Bertin, MD  Admit date: 09/25/2023 Discharge date: 09/30/2023  Admitted From: Home Disposition: Home with outpatient therapies  Recommendations for Outpatient Follow-up:  Follow up with PCP in 1-2 weeks Please obtain BMP/CBC in one week   Home Health: None.  Outpatient therapies. Equipment/Devices: Already available at home  Discharge Condition: Stable CODE STATUS: Full code Diet recommendation: Low-salt diet  Discharge summary: 63 year old with history of pulmonary embolism on Coumadin , type 2 diabetes, morbid obesity presented to primary care office with about 1 day of nausea, dizziness and not feeling well.  She also had 1 episode of vomiting.  She was very sleepy at the office so they sent her to the ER.  In the emergency room, hemodynamically stable.  Initially tachycardic.  CT scan abdomen pelvis with left upper lobe infiltrate.  Temperature 101.6 and sinus tachycardia with heart rate up to 150.  Blood pressure stable.  Admitted with IV fluids, IV antibiotics.  Patient was found to have left lingular pneumonia.  She remained in the hospital on IV antibiotics because of persistent fever.  Ultimately improved and going home today.  Community-acquired pneumonia: Left lingular pneumonia. Blood cultures negative, coag negative Staphylococcus thought to be contaminant. Received 5 days of Rocephin and azithromycin , was still having fever then he started on Levaquin .  She will continue 2 additional days of Levaquin  until 5/27 to complete total 7 days of antibiotic therapy. Afebrile last 24 hours. Strep antigen negative.  Legionella pending. Influenza A and B, RSV and COVID-19 negative. Recommended to use over-the-counter cough medications as needed..   Staphylococcus bacteremia: Likely coag negative bacteremia.  No indication to intervene.   Type 2 diabetes with hyperglycemia: On  insulin  at home.  Resume on discharge.   Hypomagnesemia: Replaced and adequate.   Hypokalemia: Replaced and adequate.   Sinus tachycardia: Appropriate to her fever.  Stable and in sinus rhythm now.   History of pulmonary embolism on Coumadin  therapy: Coumadin  redosing.  INR 2.4.  Concomitant use of Levaquin  may interfere with therapies, however will only use for 2 more days.  Advised close follow-up at least in 1 week.  Stable for discharge.  Discharge Diagnoses:  Principal Problem:   Sepsis due to pneumonia [facility]) Active Problems:   Diabetes (HCC)   Morbid obesity with BMI of 60.0-69.9, adult [facility])   Chronic anticoagulation    Discharge Instructions  Discharge Instructions     Ambulatory referral to Physical Therapy   Complete by: As directed    Resume Aquatherapy   Diet - low sodium heart healthy   Complete by: As directed    Diet Carb Modified   Complete by: As directed    Increase activity slowly   Complete by: As directed       Allergies as of 09/30/2023       Reactions   Vitamin D    Other Reaction(s): Rash on face   Penicillins Itching, Rash   Saxagliptin Rash        Medication List     STOP taking these medications    allopurinol  100 MG tablet Commonly known as: ZYLOPRIM    azithromycin  250 MG tablet Commonly known as: ZITHROMAX    benzonatate  100 MG capsule Commonly known as: TESSALON    bisacodyl  5 MG EC tablet Commonly known as: Dulcolax   colchicine  0.6 MG tablet   Colyte  with Flavor Packs 240 g solution Generic drug: polyethylene glycol   gabapentin  300 MG  capsule Commonly known as: NEURONTIN        TAKE these medications    amLODipine  10 MG tablet Commonly known as: NORVASC  Take 10 mg by mouth daily.   B-D UF III MINI PEN NEEDLES 31G X 5 MM Misc Generic drug: Insulin  Pen Needle USE AS DIRECTED 5 TIMES A DAY   folic acid  1 MG tablet Commonly known as: FOLVITE  Take 1 mg by mouth daily.   FreeStyle Freedom Lite w/Device  Kit See admin instructions.   Levemir  FlexTouch 100 UNIT/ML FlexTouch Pen Generic drug: insulin  detemir Inject 75 Units into the skin 2 (two) times daily.   levofloxacin  750 MG tablet Commonly known as: LEVAQUIN  Take 1 tablet (750 mg total) by mouth daily for 2 days.   losartan-hydrochlorothiazide  100-25 MG tablet Commonly known as: HYZAAR Take 1 tablet by mouth daily.   NovoLOG  FlexPen 100 UNIT/ML FlexPen Generic drug: insulin  aspart Inject 30 Units into the skin 3 (three) times daily with meals.   OneTouch Delica Plus Lancet30G Misc USE AS DIRECTED ONCE A DAY.   OneTouch Verio test strip Generic drug: glucose blood   pregabalin 50 MG capsule Commonly known as: LYRICA pregabalin 50 mg capsule  TAKE 1 CAPSULE BY MOUTH TWICE A DAY   rosuvastatin 10 MG tablet Commonly known as: CRESTOR rosuvastatin 10 mg tablet  TAKE 1 TABLET BY MOUTH FOR 3 DAYS A WEEK   Vitamin D (Ergocalciferol) 1.25 MG (50000 UNIT) Caps capsule Commonly known as: DRISDOL Take 50,000 Units by mouth once a week.   warfarin 5 MG tablet Commonly known as: COUMADIN  Take 1 tablet (5 mg total) by mouth daily at 6 PM.        Allergies  Allergen Reactions   Vitamin D     Other Reaction(s): Rash on face   Penicillins Itching and Rash   Saxagliptin Rash    Consultations: None   Procedures/Studies: CT Angio Chest Pulmonary Embolism (PE) W or WO Contrast Result Date: 09/26/2023 CLINICAL DATA:  Tachycardia and vomiting EXAM: CT ANGIOGRAPHY CHEST WITH CONTRAST TECHNIQUE: Multidetector CT imaging of the chest was performed using the standard protocol during bolus administration of intravenous contrast. Multiplanar CT image reconstructions and MIPs were obtained to evaluate the vascular anatomy. RADIATION DOSE REDUCTION: This exam was performed according to the departmental dose-optimization program which includes automated exposure control, adjustment of the mA and/or kV according to patient size and/or  use of iterative reconstruction technique. CONTRAST:  75mL OMNIPAQUE  IOHEXOL  350 MG/ML SOLN COMPARISON:  Chest x-ray from the previous day, CT from 07/04/2022 FINDINGS: Cardiovascular: Atherosclerotic calcifications of the aorta are noted. Pulmonary artery shows a normal branching pattern without definitive pulmonary embolus. The peripheral branches are somewhat incompletely opacified. No large central embolus is noted. Mediastinum/Nodes: The esophagus is within normal limits. No hilar or mediastinal adenopathy is noted. Thoracic inlet demonstrates diffusely enlarged right lobe of thyroid . This has been recently evaluated by ultrasound. Lungs/Pleura: The lungs are well aerated bilaterally. Bibasilar atelectasis is seen. More focal consolidation in the lingula is noted consistent with acute pneumonia. Upper Abdomen: Visualized upper abdomen is within normal limits. Musculoskeletal: No chest wall abnormality. No acute or significant osseous findings. Review of the MIP images confirms the above findings. IMPRESSION: Marked lingular consolidation with evidence of bibasilar atelectasis. No evidence of central pulmonary embolus. Electronically Signed   By: Violeta Grey M.D.   On: 09/26/2023 01:24   CT ABDOMEN PELVIS W CONTRAST Result Date: 09/25/2023 CLINICAL DATA:  Abdominal pain, acute, nonlocalized. EXAM: CT ABDOMEN  AND PELVIS WITH CONTRAST TECHNIQUE: Multidetector CT imaging of the abdomen and pelvis was performed using the standard protocol following bolus administration of intravenous contrast. RADIATION DOSE REDUCTION: This exam was performed according to the departmental dose-optimization program which includes automated exposure control, adjustment of the mA and/or kV according to patient size and/or use of iterative reconstruction technique. CONTRAST:  OMNIPAQUE  IOHEXOL  300 MG/ML  SOLN COMPARISON:  CT scan abdomen and pelvis from 11/24/2021. FINDINGS: Lower chest: There is opacity in the inferior  lingular segment of left upper lobe with air bronchogram and without volume loss, favoring left lung upper lobe pneumonia. Bilateral lungs are otherwise clear. No pleural effusion. Normal cardiac size. No pericardial effusion. Hepatobiliary: The liver is normal in size. Non-cirrhotic configuration. No suspicious mass. There are several subcentimeter hypoattenuating foci scattered throughout the liver, which are too small to adequately characterize. No intrahepatic or extrahepatic bile duct dilation. Gallbladder is surgically absent. Pancreas: Unremarkable. No pancreatic ductal dilatation or surrounding inflammatory changes. Spleen: Normal in size. There is an ill-defined approximately 2.3 x 2.4 cm hypoattenuating focus in the inferolateral portion, which is incompletely characterized on the current exam but present since the prior study the. Adrenals/Urinary Tract: Redemonstration of at least 2 myelolipomas in the left adrenal gland. Unremarkable right adrenal gland. No suspicious renal mass. There are several scattered 1 2 foreign 0.5 cm sized sinus cysts throughout bilateral kidneys. No hydronephrosis. No renal or ureteric calculi. Unremarkable urinary bladder. Stomach/Bowel: No disproportionate dilation of the small or large bowel loops. No evidence of abnormal bowel wall thickening or inflammatory changes. The appendix is unremarkable. Vascular/Lymphatic: No ascites or pneumoperitoneum. No abdominal or pelvic lymphadenopathy, by size criteria. No aneurysmal dilation of the major abdominal arteries. PICC 1 Reproductive: Not well evaluated on the CT scan exam; however, note is again made of large partially calcified lesion arising from the fundus/anterior uterine body, grossly similar to the prior study, favored to represent a leiomyoma. No large adnexal mass seen. Other: There are soft tissue density areas in the anterior abdominal wall subcutaneous tissue, most likely due to medication injections.  Musculoskeletal: No suspicious osseous lesions. There are mild - moderate multilevel degenerative changes in the visualized spine. IMPRESSION: 1. No acute inflammatory process identified within the abdomen or pelvis. 2. There is opacity in the inferior lingular segment of left upper lobe with air bronchogram and without volume loss, favoring left lung upper lobe pneumonia. Follow-up to clearing is recommended. 3. Multiple other nonacute observations, as described above. Electronically Signed   By: Beula Brunswick M.D.   On: 09/25/2023 15:13   DG Chest Portable 1 View Result Date: 09/25/2023 CLINICAL DATA:  Weakness. EXAM: PORTABLE CHEST 1 VIEW COMPARISON:  07/04/2022. FINDINGS: Low lung volume. Bilateral lung fields are clear. Bilateral costophrenic angles are clear. Note is made of elevated right hemidiaphragm. Stable cardio-mediastinal silhouette. No acute osseous abnormalities. The soft tissues are within normal limits. IMPRESSION: No active disease. Electronically Signed   By: Beula Brunswick M.D.   On: 09/25/2023 15:03   (Echo, Carotid, EGD, Colonoscopy, ERCP)    Subjective: Patient seen and examined.  Afebrile last 24 hours.  Sinus rhythm.  Some dry cough but denies any sputum production.  She has some soreness of her left ankle without any evidence of erythema or edema.   Discharge Exam: Vitals:   09/30/23 0430 09/30/23 0735  BP: (!) 145/68 136/68  Pulse: 88 93  Resp: 16 15  Temp: 98.8 F (37.1 C) 99.8 F (37.7 C)  SpO2: 99%    Vitals:   09/29/23 2205 09/29/23 2300 09/30/23 0430 09/30/23 0735  BP: 112/86 (!) 143/69 (!) 145/68 136/68  Pulse: 98 99 88 93  Resp: 18 19 16 15   Temp: 99.3 F (37.4 C) 100 F (37.8 C) 98.8 F (37.1 C) 99.8 F (37.7 C)  TempSrc: Oral Oral Oral Oral  SpO2: 100% 100% 99%   Weight:      Height:        General: Pt is alert, awake, not in acute distress.  On room air. Cardiovascular: RRR, S1/S2 +, no rubs, no gallops Respiratory: CTA bilaterally, no  wheezing, no rhonchi, no added sounds. Abdominal: Soft, NT, ND, bowel sounds +, obese and pendulous.  Nontender. Extremities: no edema, no cyanosis, nontender.  Joints are with full range of motion without effusion.    The results of significant diagnostics from this hospitalization (including imaging, microbiology, ancillary and laboratory) are listed below for reference.     Microbiology: Recent Results (from the past 240 hours)  Culture, blood (routine x 2)     Status: Abnormal   Collection Time: 09/25/23  1:34 PM   Specimen: BLOOD  Result Value Ref Range Status   Specimen Description   Final    BLOOD LEFT ANTECUBITAL Performed at [facility], [address], [place], [place] [address]    Special Requests   Final    BOTTLES DRAWN AEROBIC AND ANAEROBIC Blood Culture adequate volume Performed at [facility], [address] [place], [place] [address]    Culture  Setup Time   Final    GRAM POSITIVE COCCI ANAEROBIC BOTTLE ONLY CRITICAL RESULT CALLED TO, READ BACK BY AND VERIFIED WITH: PHARMD T. RUDISILL [id number] @ 1604 FH    Culture (A)  Final    STAPHYLOCOCCUS CAPITIS THE SIGNIFICANCE OF ISOLATING THIS ORGANISM FROM A SINGLE VENIPUNCTURE CANNOT BE PREDICTED WITHOUT FURTHER CLINICAL AND CULTURE CORRELATION. SUSCEPTIBILITIES AVAILABLE ONLY ON REQUEST. Performed at [facility], [address], [place], [place] [address]    Report Status 09/29/2023 FINAL  Final  Blood Culture ID Panel (Reflexed)     Status: Abnormal   Collection Time: 09/25/23  1:34 PM  Result Value Ref Range Status   Enterococcus faecalis NOT DETECTED NOT DETECTED Final   Enterococcus Faecium NOT DETECTED NOT DETECTED Final   Listeria monocytogenes NOT DETECTED NOT DETECTED Final   Staphylococcus species DETECTED (A) NOT DETECTED Final    Comment: CRITICAL RESULT CALLED TO, READ BACK BY AND VERIFIED WITH: PHARMD T. RUDISILL [id number] @ 1604 FH    Staphylococcus  aureus (BCID) NOT DETECTED NOT DETECTED Final   Staphylococcus epidermidis NOT DETECTED NOT DETECTED Final   Staphylococcus lugdunensis NOT DETECTED NOT DETECTED Final   Streptococcus species NOT DETECTED NOT DETECTED Final   Streptococcus agalactiae NOT DETECTED NOT DETECTED Final   Streptococcus pneumoniae NOT DETECTED NOT DETECTED Final   Streptococcus pyogenes NOT DETECTED NOT DETECTED Final   A.calcoaceticus-baumannii NOT DETECTED NOT DETECTED Final   Bacteroides fragilis NOT DETECTED NOT DETECTED Final   Enterobacterales NOT DETECTED NOT DETECTED Final   Enterobacter cloacae complex NOT DETECTED NOT DETECTED Final   Escherichia coli NOT DETECTED NOT DETECTED Final   Klebsiella aerogenes NOT DETECTED NOT DETECTED Final   Klebsiella oxytoca NOT DETECTED NOT DETECTED Final   Klebsiella pneumoniae NOT DETECTED NOT DETECTED Final   Proteus species NOT DETECTED NOT DETECTED Final   Salmonella species NOT DETECTED NOT DETECTED Final   Serratia marcescens NOT DETECTED  NOT DETECTED Final   Haemophilus influenzae NOT DETECTED NOT DETECTED Final   Neisseria meningitidis NOT DETECTED NOT DETECTED Final   Pseudomonas aeruginosa NOT DETECTED NOT DETECTED Final   Stenotrophomonas maltophilia NOT DETECTED NOT DETECTED Final   Candida albicans NOT DETECTED NOT DETECTED Final   Candida auris NOT DETECTED NOT DETECTED Final   Candida glabrata NOT DETECTED NOT DETECTED Final   Candida krusei NOT DETECTED NOT DETECTED Final   Candida parapsilosis NOT DETECTED NOT DETECTED Final   Candida tropicalis NOT DETECTED NOT DETECTED Final   Cryptococcus neoformans/gattii NOT DETECTED NOT DETECTED Final    Comment: Performed at [facility], [address] [place], [place] [address]  Resp panel by RT-PCR (RSV, Flu A&B, Covid) Anterior Nasal Swab     Status: None   Collection Time: 09/25/23 11:19 PM   Specimen: Anterior Nasal Swab  Result Value Ref Range Status   SARS Coronavirus 2 by RT PCR NEGATIVE  NEGATIVE Final   Influenza A by PCR NEGATIVE NEGATIVE Final   Influenza B by PCR NEGATIVE NEGATIVE Final    Comment: (NOTE) The Xpert Xpress SARS-CoV-2/FLU/RSV plus assay is intended as an aid in the diagnosis of influenza from Nasopharyngeal swab specimens and should not be used as a sole basis for treatment. Nasal washings and aspirates are unacceptable for Xpert Xpress SARS-CoV-2/FLU/RSV testing.  Fact Sheet for Patients: BloggerCourse.com  Fact Sheet for Healthcare Providers: SeriousBroker.it  This test is not yet approved or cleared by the [place]  FDA and has been authorized for detection and/or diagnosis of SARS-CoV-2 by FDA under an Emergency Use Authorization (EUA). This EUA will remain in effect (meaning this test can be used) for the duration of the COVID-19 declaration under Section 564(b)(1) of the Act, 21 U.S.C. section 360bbb-3(b)(1), unless the authorization is terminated or revoked.     Resp Syncytial Virus by PCR NEGATIVE NEGATIVE Final    Comment: (NOTE) Fact Sheet for Patients: BloggerCourse.com  Fact Sheet for Healthcare Providers: SeriousBroker.it  This test is not yet approved or cleared by the [place]  FDA and has been authorized for detection and/or diagnosis of SARS-CoV-2 by FDA under an Emergency Use Authorization (EUA). This EUA will remain in effect (meaning this test can be used) for the duration of the COVID-19 declaration under Section 564(b)(1) of the Act, 21 U.S.C. section 360bbb-3(b)(1), unless the authorization is terminated or revoked.  Performed at [facility], [address], [place], [place] [address]      Labs: BNP (last 3 results) No results for input(s): "BNP" in the last 8760 hours. Basic Metabolic Panel: Recent Labs  Lab 09/25/23 1317 09/25/23 1411 09/25/23 2010 09/26/23 0601 09/27/23 1115 09/28/23 0631  09/29/23 0443 09/30/23 0230  NA 136   < >  --  135 137 138 136 134*  K 3.7   < > 3.7 3.5 3.2* 3.7 4.5 4.6  CL 93*  --   --  100 101 104 105 104  CO2 28  --   --  24 25 24 24 22   GLUCOSE 191*  --   --  195* 172* 57* 109* 151*  BUN 22  --   --  19 18 16 15 11   CREATININE 1.24*  --   --  1.20* 1.17* 1.07* 1.05* 0.88  CALCIUM 9.8  --   --  8.0* 7.9* 8.2* 8.1* 8.0*  MG 1.5*  --  1.7 2.0  --   --   --   --    < > =  values in this interval not displayed.   Liver Function Tests: Recent Labs  Lab 09/25/23 1317  AST 36  ALT 22  ALKPHOS 78  BILITOT 0.9  PROT 8.2*  ALBUMIN 3.9   Recent Labs  Lab 09/25/23 1317  LIPASE 58*   No results for input(s): "AMMONIA" in the last 168 hours. CBC: Recent Labs  Lab 09/25/23 1317 09/25/23 1411 09/26/23 1134 09/27/23 0349 09/28/23 0631 09/29/23 0443 09/30/23 0230  WBC 17.4*  --  13.4* 15.4* 13.1* 14.9* 15.4*  NEUTROABS 14.4*  --   --  11.0*  --   --   --   HGB 11.9*   < > 10.7* 10.0* 9.8* 9.3* 8.4*  HCT 38.0   < > 33.7* 32.1* 30.5* 30.3* 27.0*  MCV 86.6  --  86.6 87.7 85.4 86.8 87.7  PLT 306  --  285 289 334 311 307   < > = values in this interval not displayed.   Cardiac Enzymes: No results for input(s): "CKTOTAL", "CKMB", "CKMBINDEX", "TROPONINI" in the last 168 hours. BNP: Invalid input(s): "POCBNP" CBG: Recent Labs  Lab 09/29/23 0907 09/29/23 1159 09/29/23 1612 09/29/23 2041 09/30/23 0723  GLUCAP 172* 110* 152* 253* 73   D-Dimer No results for input(s): "DDIMER" in the last 72 hours. Hgb A1c No results for input(s): "HGBA1C" in the last 72 hours. Lipid Profile No results for input(s): "CHOL", "HDL", "LDLCALC", "TRIG", "CHOLHDL", "LDLDIRECT" in the last 72 hours. Thyroid  function studies No results for input(s): "TSH", "T4TOTAL", "T3FREE", "THYROIDAB" in the last 72 hours.  Invalid input(s): "FREET3" Anemia work up No results for input(s): "VITAMINB12", "FOLATE", "FERRITIN", "TIBC", "IRON", "RETICCTPCT" in the last 72  hours. Urinalysis    Component Value Date/Time   COLORURINE YELLOW 09/25/2023 1545   APPEARANCEUR CLOUDY (A) 09/25/2023 1545   LABSPEC 1.037 (H) 09/25/2023 1545   PHURINE 6.0 09/25/2023 1545   GLUCOSEU NEGATIVE 09/25/2023 1545   HGBUR MODERATE (A) 09/25/2023 1545   BILIRUBINUR NEGATIVE 09/25/2023 1545   BILIRUBINUR neg 05/15/2013 1759   KETONESUR TRACE (A) 09/25/2023 1545   PROTEINUR 100 (A) 09/25/2023 1545   UROBILINOGEN 0.2 05/15/2013 1759   UROBILINOGEN 0.2 04/03/2012 1251   NITRITE NEGATIVE 09/25/2023 1545   LEUKOCYTESUR LARGE (A) 09/25/2023 1545   Sepsis Labs Recent Labs  Lab 09/27/23 0349 09/28/23 0631 09/29/23 0443 09/30/23 0230  WBC 15.4* 13.1* 14.9* 15.4*   Microbiology Recent Results (from the past 240 hours)  Culture, blood (routine x 2)     Status: Abnormal   Collection Time: 09/25/23  1:34 PM   Specimen: BLOOD  Result Value Ref Range Status   Specimen Description   Final    BLOOD LEFT ANTECUBITAL Performed at [facility], [address] [place], [place] [address]    Special Requests   Final    BOTTLES DRAWN AEROBIC AND ANAEROBIC Blood Culture adequate volume Performed at [facility], [address] [place], [place] [address]    Culture  Setup Time   Final    GRAM POSITIVE COCCI ANAEROBIC BOTTLE ONLY CRITICAL RESULT CALLED TO, READ BACK BY AND VERIFIED WITH: PHARMD T. RUDISILL [id number] @ 1604 FH    Culture (A)  Final    STAPHYLOCOCCUS CAPITIS THE SIGNIFICANCE OF ISOLATING THIS ORGANISM FROM A SINGLE VENIPUNCTURE CANNOT BE PREDICTED WITHOUT FURTHER CLINICAL AND CULTURE CORRELATION. SUSCEPTIBILITIES AVAILABLE ONLY ON REQUEST. Performed at [facility], [address], [place], [place] [address]    Report Status 09/29/2023 FINAL  Final  Blood  Culture ID Panel (Reflexed)     Status: Abnormal   Collection Time: 09/25/23  1:34 PM  Result Value Ref Range Status   Enterococcus faecalis NOT DETECTED NOT  DETECTED Final   Enterococcus Faecium NOT DETECTED NOT DETECTED Final   Listeria monocytogenes NOT DETECTED NOT DETECTED Final   Staphylococcus species DETECTED (A) NOT DETECTED Final    Comment: CRITICAL RESULT CALLED TO, READ BACK BY AND VERIFIED WITH: PHARMD T. RUDISILL [id number] @ 1604 FH    Staphylococcus aureus (BCID) NOT DETECTED NOT DETECTED Final   Staphylococcus epidermidis NOT DETECTED NOT DETECTED Final   Staphylococcus lugdunensis NOT DETECTED NOT DETECTED Final   Streptococcus species NOT DETECTED NOT DETECTED Final   Streptococcus agalactiae NOT DETECTED NOT DETECTED Final   Streptococcus pneumoniae NOT DETECTED NOT DETECTED Final   Streptococcus pyogenes NOT DETECTED NOT DETECTED Final   A.calcoaceticus-baumannii NOT DETECTED NOT DETECTED Final   Bacteroides fragilis NOT DETECTED NOT DETECTED Final   Enterobacterales NOT DETECTED NOT DETECTED Final   Enterobacter cloacae complex NOT DETECTED NOT DETECTED Final   Escherichia coli NOT DETECTED NOT DETECTED Final   Klebsiella aerogenes NOT DETECTED NOT DETECTED Final   Klebsiella oxytoca NOT DETECTED NOT DETECTED Final   Klebsiella pneumoniae NOT DETECTED NOT DETECTED Final   Proteus species NOT DETECTED NOT DETECTED Final   Salmonella species NOT DETECTED NOT DETECTED Final   Serratia marcescens NOT DETECTED NOT DETECTED Final   Haemophilus influenzae NOT DETECTED NOT DETECTED Final   Neisseria meningitidis NOT DETECTED NOT DETECTED Final   Pseudomonas aeruginosa NOT DETECTED NOT DETECTED Final   Stenotrophomonas maltophilia NOT DETECTED NOT DETECTED Final   Candida albicans NOT DETECTED NOT DETECTED Final   Candida auris NOT DETECTED NOT DETECTED Final   Candida glabrata NOT DETECTED NOT DETECTED Final   Candida krusei NOT DETECTED NOT DETECTED Final   Candida parapsilosis NOT DETECTED NOT DETECTED Final   Candida tropicalis NOT DETECTED NOT DETECTED Final   Cryptococcus neoformans/gattii NOT DETECTED NOT DETECTED Final     Comment: Performed at [facility], [address] [place], [place] [address]  Resp panel by RT-PCR (RSV, Flu A&B, Covid) Anterior Nasal Swab     Status: None   Collection Time: 09/25/23 11:19 PM   Specimen: Anterior Nasal Swab  Result Value Ref Range Status   SARS Coronavirus 2 by RT PCR NEGATIVE NEGATIVE Final   Influenza A by PCR NEGATIVE NEGATIVE Final   Influenza B by PCR NEGATIVE NEGATIVE Final    Comment: (NOTE) The Xpert Xpress SARS-CoV-2/FLU/RSV plus assay is intended as an aid in the diagnosis of influenza from Nasopharyngeal swab specimens and should not be used as a sole basis for treatment. Nasal washings and aspirates are unacceptable for Xpert Xpress SARS-CoV-2/FLU/RSV testing.  Fact Sheet for Patients: BloggerCourse.com  Fact Sheet for Healthcare Providers: SeriousBroker.it  This test is not yet approved or cleared by the [place]  FDA and has been authorized for detection and/or diagnosis of SARS-CoV-2 by FDA under an Emergency Use Authorization (EUA). This EUA will remain in effect (meaning this test can be used) for the duration of the COVID-19 declaration under Section 564(b)(1) of the Act, 21 U.S.C. section 360bbb-3(b)(1), unless the authorization is terminated or revoked.     Resp Syncytial Virus by PCR NEGATIVE NEGATIVE Final    Comment: (NOTE) Fact Sheet for Patients: BloggerCourse.com  Fact Sheet for Healthcare Providers: SeriousBroker.it  This test is not yet approved or cleared by the [place]  FDA and  has been authorized for detection and/or diagnosis of SARS-CoV-2 by FDA under an Emergency Use Authorization (EUA). This EUA will remain in effect (meaning this test can be used) for the duration of the COVID-19 declaration under Section 564(b)(1) of the Act, 21 U.S.C. section 360bbb-3(b)(1), unless the authorization is terminated  or revoked.  Performed at [facility], [address] [place], [place] [address]      Time coordinating discharge: 35 minutes  SIGNED:   Vada Garibaldi, MD  [facility] 09/30/2023, 8:04 AM

## 2023-11-02 ENCOUNTER — Other Ambulatory Visit: Payer: Self-pay | Admitting: Family Medicine

## 2023-11-02 ENCOUNTER — Ambulatory Visit
Admission: RE | Admit: 2023-11-02 | Discharge: 2023-11-02 | Disposition: A | Discharge status: Home / Self Care | Source: Ambulatory Visit | Attending: Family Medicine | Admitting: Family Medicine

## 2023-11-02 DIAGNOSIS — J189 Pneumonia, unspecified organism: Secondary | ICD-10-CM

## 2024-03-13 ENCOUNTER — Ambulatory Visit: Admitting: Podiatry

## 2024-03-13 ENCOUNTER — Telehealth: Payer: Self-pay | Admitting: Podiatry

## 2024-03-13 NOTE — Telephone Encounter (Signed)
 Patient called in requesting an appointment for tomorrow if possible. She fell and cannot get up. The paramedics are on the way to her home to assist.

## 2024-03-14 ENCOUNTER — Emergency Department (HOSPITAL_BASED_OUTPATIENT_CLINIC_OR_DEPARTMENT_OTHER)

## 2024-03-14 ENCOUNTER — Inpatient Hospital Stay (HOSPITAL_BASED_OUTPATIENT_CLINIC_OR_DEPARTMENT_OTHER)
Admission: EM | Admit: 2024-03-14 | Discharge: 2024-03-21 | DRG: 554 | Disposition: A | Discharge status: Home / Self Care | Attending: Internal Medicine | Admitting: Internal Medicine

## 2024-03-14 ENCOUNTER — Other Ambulatory Visit: Payer: Self-pay

## 2024-03-14 ENCOUNTER — Encounter (HOSPITAL_BASED_OUTPATIENT_CLINIC_OR_DEPARTMENT_OTHER): Payer: Self-pay | Admitting: Emergency Medicine

## 2024-03-14 ENCOUNTER — Ambulatory Visit: Admitting: Podiatry

## 2024-03-14 DIAGNOSIS — M25572 Pain in left ankle and joints of left foot: Secondary | ICD-10-CM | POA: Diagnosis present

## 2024-03-14 DIAGNOSIS — D638 Anemia in other chronic diseases classified elsewhere: Secondary | ICD-10-CM | POA: Diagnosis present

## 2024-03-14 DIAGNOSIS — M25471 Effusion, right ankle: Secondary | ICD-10-CM | POA: Diagnosis present

## 2024-03-14 DIAGNOSIS — Z6841 Body Mass Index (BMI) 40.0 and over, adult: Secondary | ICD-10-CM

## 2024-03-14 DIAGNOSIS — M609 Myositis, unspecified: Secondary | ICD-10-CM | POA: Diagnosis present

## 2024-03-14 DIAGNOSIS — M25472 Effusion, left ankle: Secondary | ICD-10-CM | POA: Diagnosis present

## 2024-03-14 DIAGNOSIS — Z794 Long term (current) use of insulin: Secondary | ICD-10-CM

## 2024-03-14 DIAGNOSIS — K760 Fatty (change of) liver, not elsewhere classified: Secondary | ICD-10-CM | POA: Diagnosis present

## 2024-03-14 DIAGNOSIS — M170 Bilateral primary osteoarthritis of knee: Secondary | ICD-10-CM

## 2024-03-14 DIAGNOSIS — I454 Nonspecific intraventricular block: Secondary | ICD-10-CM | POA: Diagnosis present

## 2024-03-14 DIAGNOSIS — Z5982 Transportation insecurity: Secondary | ICD-10-CM

## 2024-03-14 DIAGNOSIS — E1165 Type 2 diabetes mellitus with hyperglycemia: Secondary | ICD-10-CM | POA: Diagnosis present

## 2024-03-14 DIAGNOSIS — Z79899 Other long term (current) drug therapy: Secondary | ICD-10-CM

## 2024-03-14 DIAGNOSIS — Z888 Allergy status to other drugs, medicaments and biological substances status: Secondary | ICD-10-CM

## 2024-03-14 DIAGNOSIS — D1779 Benign lipomatous neoplasm of other sites: Secondary | ICD-10-CM | POA: Diagnosis present

## 2024-03-14 DIAGNOSIS — Z9049 Acquired absence of other specified parts of digestive tract: Secondary | ICD-10-CM

## 2024-03-14 DIAGNOSIS — E785 Hyperlipidemia, unspecified: Secondary | ICD-10-CM | POA: Diagnosis present

## 2024-03-14 DIAGNOSIS — Z880 Allergy status to penicillin: Secondary | ICD-10-CM

## 2024-03-14 DIAGNOSIS — D259 Leiomyoma of uterus, unspecified: Secondary | ICD-10-CM | POA: Diagnosis present

## 2024-03-14 DIAGNOSIS — M19071 Primary osteoarthritis, right ankle and foot: Principal | ICD-10-CM | POA: Diagnosis present

## 2024-03-14 DIAGNOSIS — T380X5A Adverse effect of glucocorticoids and synthetic analogues, initial encounter: Secondary | ICD-10-CM | POA: Diagnosis present

## 2024-03-14 DIAGNOSIS — Z86711 Personal history of pulmonary embolism: Secondary | ICD-10-CM

## 2024-03-14 DIAGNOSIS — R6510 Systemic inflammatory response syndrome (SIRS) of non-infectious origin without acute organ dysfunction: Secondary | ICD-10-CM | POA: Diagnosis present

## 2024-03-14 DIAGNOSIS — A419 Sepsis, unspecified organism: Principal | ICD-10-CM | POA: Diagnosis present

## 2024-03-14 DIAGNOSIS — D7389 Other diseases of spleen: Secondary | ICD-10-CM | POA: Diagnosis present

## 2024-03-14 DIAGNOSIS — I10 Essential (primary) hypertension: Secondary | ICD-10-CM | POA: Diagnosis present

## 2024-03-14 DIAGNOSIS — Z7901 Long term (current) use of anticoagulants: Secondary | ICD-10-CM

## 2024-03-14 DIAGNOSIS — M25562 Pain in left knee: Secondary | ICD-10-CM | POA: Diagnosis present

## 2024-03-14 DIAGNOSIS — E790 Hyperuricemia without signs of inflammatory arthritis and tophaceous disease: Secondary | ICD-10-CM | POA: Diagnosis present

## 2024-03-14 DIAGNOSIS — Z7985 Long-term (current) use of injectable non-insulin antidiabetic drugs: Secondary | ICD-10-CM

## 2024-03-14 DIAGNOSIS — N179 Acute kidney failure, unspecified: Secondary | ICD-10-CM | POA: Diagnosis present

## 2024-03-14 DIAGNOSIS — M25571 Pain in right ankle and joints of right foot: Secondary | ICD-10-CM | POA: Diagnosis present

## 2024-03-14 DIAGNOSIS — M19072 Primary osteoarthritis, left ankle and foot: Secondary | ICD-10-CM | POA: Diagnosis present

## 2024-03-14 DIAGNOSIS — M25561 Pain in right knee: Secondary | ICD-10-CM | POA: Diagnosis present

## 2024-03-14 LAB — CBC WITH DIFFERENTIAL/PLATELET
Abs Immature Granulocytes: 0.2 K/uL — ABNORMAL HIGH (ref 0.00–0.07)
Basophils Absolute: 0.1 K/uL (ref 0.0–0.1)
Basophils Relative: 0 %
Eosinophils Absolute: 0 K/uL (ref 0.0–0.5)
Eosinophils Relative: 0 %
HCT: 31.9 % — ABNORMAL LOW (ref 36.0–46.0)
Hemoglobin: 9.8 g/dL — ABNORMAL LOW (ref 12.0–15.0)
Immature Granulocytes: 1 %
Lymphocytes Relative: 4 %
Lymphs Abs: 0.8 K/uL (ref 0.7–4.0)
MCH: 26.6 pg (ref 26.0–34.0)
MCHC: 30.7 g/dL (ref 30.0–36.0)
MCV: 86.7 fL (ref 80.0–100.0)
Monocytes Absolute: 1.9 K/uL — ABNORMAL HIGH (ref 0.1–1.0)
Monocytes Relative: 9 %
Neutro Abs: 17.7 K/uL — ABNORMAL HIGH (ref 1.7–7.7)
Neutrophils Relative %: 86 %
Platelets: 367 K/uL (ref 150–400)
RBC: 3.68 MIL/uL — ABNORMAL LOW (ref 3.87–5.11)
RDW: 16.3 % — ABNORMAL HIGH (ref 11.5–15.5)
WBC: 20.7 K/uL — ABNORMAL HIGH (ref 4.0–10.5)
nRBC: 0 % (ref 0.0–0.2)

## 2024-03-14 LAB — RESP PANEL BY RT-PCR (RSV, FLU A&B, COVID)  RVPGX2
Influenza A by PCR: NEGATIVE
Influenza B by PCR: NEGATIVE
Resp Syncytial Virus by PCR: NEGATIVE
SARS Coronavirus 2 by RT PCR: NEGATIVE

## 2024-03-14 LAB — CBG MONITORING, ED
Glucose-Capillary: 173 mg/dL — ABNORMAL HIGH (ref 70–99)
Glucose-Capillary: 150 mg/dL — ABNORMAL HIGH (ref 70–99)

## 2024-03-14 LAB — COMPREHENSIVE METABOLIC PANEL WITH GFR
ALT: 16 U/L (ref 0–44)
AST: 31 U/L (ref 15–41)
Albumin: 3.6 g/dL (ref 3.5–5.0)
Alkaline Phosphatase: 64 U/L (ref 38–126)
Anion gap: 13 (ref 5–15)
BUN: 22 mg/dL (ref 8–23)
CO2: 29 mmol/L (ref 22–32)
Calcium: 9.5 mg/dL (ref 8.9–10.3)
Chloride: 98 mmol/L (ref 98–111)
Creatinine, Ser: 1.28 mg/dL — ABNORMAL HIGH (ref 0.44–1.00)
GFR, Estimated: 47 mL/min — ABNORMAL LOW (ref >60)
Glucose, Bld: 160 mg/dL — ABNORMAL HIGH (ref 70–99)
Potassium: 3.4 mmol/L — ABNORMAL LOW (ref 3.5–5.1)
Sodium: 140 mmol/L (ref 135–145)
Total Bilirubin: 1.2 mg/dL (ref 0.0–1.2)
Total Protein: 7.3 g/dL (ref 6.5–8.1)

## 2024-03-14 LAB — URINALYSIS, W/ REFLEX TO CULTURE (INFECTION SUSPECTED)
Bilirubin Urine: NEGATIVE
Glucose, UA: NEGATIVE mg/dL
Ketones, ur: 15 mg/dL — AB
Leukocytes,Ua: NEGATIVE
Nitrite: NEGATIVE
Protein, ur: 30 mg/dL — AB
Specific Gravity, Urine: 1.019 (ref 1.005–1.030)
pH: 5.5 (ref 5.0–8.0)

## 2024-03-14 LAB — PROTIME-INR
INR: 2 — ABNORMAL HIGH (ref 0.8–1.2)
Prothrombin Time: 23.6 s — ABNORMAL HIGH (ref 11.4–15.2)

## 2024-03-14 LAB — MAGNESIUM: Magnesium: 1.5 mg/dL — ABNORMAL LOW (ref 1.7–2.4)

## 2024-03-14 LAB — LACTIC ACID, PLASMA: Lactic Acid, Venous: 1.3 mmol/L (ref 0.5–1.9)

## 2024-03-14 MED ORDER — LACTATED RINGERS IV SOLN: INTRAVENOUS | Status: DC

## 2024-03-14 MED ORDER — ONDANSETRON HCL 4 MG/2ML IJ SOLN: 4.0000 mg | Freq: Once | INTRAMUSCULAR | Status: DC

## 2024-03-14 MED ORDER — SODIUM CHLORIDE 0.9 % IV SOLN
2.0000 g | Freq: Once | INTRAVENOUS | Status: AC
  Administered 2024-03-14: 2 g via INTRAVENOUS
  Filled 2024-03-14: qty 12.5

## 2024-03-14 MED ORDER — MAGNESIUM SULFATE 2 GM/50ML IV SOLN
2.0000 g | Freq: Once | INTRAVENOUS | Status: AC
  Administered 2024-03-14: 2 g via INTRAVENOUS
  Filled 2024-03-14: qty 50

## 2024-03-14 MED ORDER — METRONIDAZOLE 500 MG/100ML IV SOLN
500.0000 mg | Freq: Once | INTRAVENOUS | Status: AC
  Administered 2024-03-14: 500 mg via INTRAVENOUS
  Filled 2024-03-14: qty 100

## 2024-03-14 MED ORDER — LACTATED RINGERS IV BOLUS (SEPSIS)
1000.0000 mL | Freq: Once | INTRAVENOUS | Status: AC
  Administered 2024-03-14: 1000 mL via INTRAVENOUS

## 2024-03-14 MED ORDER — VANCOMYCIN HCL IN DEXTROSE 1-5 GM/200ML-% IV SOLN
1000.0000 mg | Freq: Once | INTRAVENOUS | Status: AC
  Administered 2024-03-14: 1000 mg via INTRAVENOUS
  Filled 2024-03-14: qty 200

## 2024-03-14 MED ORDER — METOCLOPRAMIDE HCL 5 MG/ML IJ SOLN
10.0000 mg | Freq: Once | INTRAMUSCULAR | Status: AC
  Administered 2024-03-14: 10 mg via INTRAVENOUS
  Filled 2024-03-14: qty 2

## 2024-03-14 MED ORDER — IOHEXOL 300 MG/ML  SOLN
100.0000 mL | Freq: Once | INTRAMUSCULAR | Status: AC | PRN
  Administered 2024-03-14: 100 mL via INTRAVENOUS

## 2024-03-14 MED ORDER — VANCOMYCIN HCL IN DEXTROSE 1-5 GM/200ML-% IV SOLN
1000.0000 mg | Freq: Once | INTRAVENOUS | Status: AC
  Administered 2024-03-14: 1000 mg via INTRAVENOUS
  Filled 2024-03-14 (×2): qty 200

## 2024-03-14 MED ORDER — MORPHINE SULFATE (PF) 4 MG/ML IV SOLN
4.0000 mg | Freq: Once | INTRAVENOUS | Status: AC
  Administered 2024-03-14: 4 mg via INTRAVENOUS
  Filled 2024-03-14: qty 1

## 2024-03-14 MED ORDER — INSULIN ASPART 100 UNIT/ML IJ SOLN: 0.0000 [IU] | Freq: Every day | INTRAMUSCULAR | Status: DC

## 2024-03-14 MED ORDER — ACETAMINOPHEN 500 MG PO TABS
1000.0000 mg | ORAL_TABLET | Freq: Once | ORAL | Status: AC
  Administered 2024-03-14: 1000 mg via ORAL
  Filled 2024-03-14: qty 2

## 2024-03-14 MED ORDER — INSULIN ASPART 100 UNIT/ML IJ SOLN: 0.0000 [IU] | Freq: Three times a day (TID) | INTRAMUSCULAR | Status: DC

## 2024-03-14 NOTE — ED Provider Notes (Signed)
 [place] EMERGENCY DEPARTMENT AT [facility] Provider Note   CSN: [id number] Arrival date & time: 03/14/24  1153     Patient presents with: Leg Pain   Natasha Rollins is a 63 y.o. female.   Pt is a 63 year old with history of pulmonary embolism on Coumadin , type 2 diabetes, morbid obesity and general lysed joint aches and pains that she gets injections for who is presenting today due to inability to get up and severe pain in her legs.  Patient reports that symptoms started on Tuesday and have only progressed.  She initially thought it was time to get injections in her legs again which she gets in her ankles with Dr. Alona.  They made her multiple appointments but she reports she could not get out of the chair because it hurt too badly.  In the last 2 days she has been urinating on herself and her family has been cleaning her up.  She has had nausea and poor appetite.  She has also felt chilled but is unaware of having a fever.  She denies any shortness of breath or cough that she is aware of.  However of note in May of this year patient was admitted for somewhat similar symptoms and at that time was found to have a lingular pneumonia.  She denies any recent sick contacts and denies any abdominal pain or vomiting.  She denies dysuria and no recent medication changes.  The history is provided by the patient and medical records.  Leg Pain      Prior to Admission medications   Medication Sig Start Date End Date Taking? Authorizing Provider  LANTUS  SOLOSTAR 100 UNIT/ML Solostar Pen Inject 30 Units into the skin 2 (two) times daily. 09/08/22  Yes [provider]  amLODipine  (NORVASC ) 10 MG tablet Take 10 mg by mouth daily. 06/07/14   [provider]  B-D UF III MINI PEN NEEDLES 31G X 5 MM MISC USE AS DIRECTED 5 TIMES A DAY 12/20/18   [provider]  Blood Glucose Monitoring Suppl (FREESTYLE FREEDOM LITE) w/Device KIT See admin instructions. 07/03/19   [provider]  Continuous Glucose Sensor (DEXCOM G7 SENSOR) MISC as directed.    [provider]  folic acid  (FOLVITE ) 1 MG tablet Take 1 mg by mouth daily.    [provider]  insulin  aspart (NOVOLOG  FLEXPEN) 100 UNIT/ML FlexPen Inject 50 Units into the skin 3 (three) times daily with meals.    [provider]  Lancets (ONETOUCH DELICA PLUS LANCET30G) MISC USE AS DIRECTED ONCE A DAY. 06/17/18   [provider]  LEVEMIR  FLEXTOUCH 100 UNIT/ML Pen Inject 75 Units into the skin 2 (two) times daily.  07/28/14   [provider]  losartan-hydrochlorothiazide  (HYZAAR) 100-25 MG tablet Take 1 tablet by mouth daily. 12/20/17   [provider]  MOUNJARO 15 MG/0.5ML Pen Inject 15 mg into the skin once a week.    [provider]  NOVOLOG  FLEXPEN 100 UNIT/ML FlexPen Inject 30 Units into the skin 3 (three) times daily with meals.  07/28/14   [provider]  ONETOUCH VERIO test strip  11/26/18   [provider]  pregabalin  (LYRICA ) 50 MG capsule pregabalin  50 mg capsule  TAKE 1 CAPSULE BY MOUTH TWICE A DAY    [provider]  rosuvastatin (CRESTOR) 10 MG tablet rosuvastatin 10 mg tablet  TAKE 1 TABLET BY MOUTH FOR 3 DAYS A WEEK 10/13/20   [provider]  Vitamin D, Ergocalciferol, (  DRISDOL) 1.25 MG (50000 UNIT) CAPS capsule Take 50,000 Units by mouth once a week. 03/22/22   [provider]  warfarin (COUMADIN ) 5 MG tablet Take 1 tablet (5 mg total) by mouth daily at 6 PM. 09/11/16 09/25/23  Levora Reyes SAUNDERS, MD    Allergies: Vitamin d, Penicillins, and Saxagliptin    Review of Systems  Updated Vital Signs BP (!) 124/59   Pulse (!) 123   Temp (!) 100.7 F (38.2 C) (Oral)   Resp 16   SpO2 99%   Physical Exam Vitals and nursing note reviewed.  Constitutional:      General: She is not in acute distress.    Appearance: She is well-developed.  HENT:     Head: Normocephalic and atraumatic.  Eyes:      Pupils: Pupils are equal, round, and reactive to light.  Cardiovascular:     Rate and Rhythm: Regular rhythm. Tachycardia present.     Heart sounds: Normal heart sounds. No murmur heard.    No friction rub.  Pulmonary:     Effort: Pulmonary effort is normal.     Breath sounds: Normal breath sounds. No wheezing or rales.     Comments: Due to body habitus and positioning in the bed difficultly hearing the bases of the lungs Abdominal:     General: Bowel sounds are normal. There is no distension.     Palpations: Abdomen is soft.     Tenderness: There is no abdominal tenderness. There is no guarding or rebound.     Comments: No cellulitis noted over the pannus and no pain with palpation of the abdomen  Musculoskeletal:        General: Tenderness present. Normal range of motion.     Comments: Tenderness with palpation of bilateral ankles but no erythema or swelling noted in the joints.  The legs are not swollen.  2+ bounding DP pulses bilaterally  Skin:    General: Skin is warm and dry.     Findings: No rash.  Neurological:     Mental Status: She is alert and oriented to person, place, and time. Mental status is at baseline.     Cranial Nerves: No cranial nerve deficit.  Psychiatric:        Mood and Affect: Mood normal.        Behavior: Behavior normal.     (all labs ordered are listed, but only abnormal results are displayed) Labs Reviewed  COMPREHENSIVE METABOLIC PANEL WITH GFR - Abnormal; Notable for the following components:      Result Value   Potassium 3.4 (*)    Glucose, Bld 160 (*)    Creatinine, Ser 1.28 (*)    GFR, Estimated 47 (*)    All other components within normal limits  CBC WITH DIFFERENTIAL/PLATELET - Abnormal; Notable for the following components:   WBC 20.7 (*)    RBC 3.68 (*)    Hemoglobin 9.8 (*)    HCT 31.9 (*)    RDW 16.3 (*)    Neutro Abs 17.7 (*)    Monocytes Absolute 1.9 (*)    Abs Immature Granulocytes 0.20 (*)    All other components within  normal limits  PROTIME-INR - Abnormal; Notable for the following components:   Prothrombin Time 23.6 (*)    INR 2.0 (*)    All other components within normal limits  MAGNESIUM  - Abnormal; Notable for the following components:   Magnesium  1.5 (*)    All other components within normal limits  RESP PANEL BY RT-PCR (RSV, FLU A&B, COVID)  RVPGX2  CULTURE, BLOOD (ROUTINE X 2)  CULTURE, BLOOD (ROUTINE X 2)  LACTIC ACID, PLASMA  LACTIC ACID, PLASMA  URINALYSIS, W/ REFLEX TO CULTURE (INFECTION SUSPECTED)    EKG: EKG Interpretation Date/Time:  Friday March 14 2024 12:31:34 EST Ventricular Rate:  117 PR Interval:  157 QRS Duration:  91 QT Interval:  317 QTC Calculation: 443 R Axis:   -18  Text Interpretation: Sinus tachycardia Atrial premature complex Sinus pause Borderline left axis deviation Low voltage, precordial leads Abnormal R-wave progression, early transition Consider anterior infarct wide complex going into a narrow complex similar to may 2025 Confirmed by Doretha Folks ([id number]) on 03/14/2024 1:25:44 PM  Radiology: [facility] Chest Port 1 View Result Date: 03/14/2024 EXAM: 1 VIEW(S) XRAY OF THE CHEST 03/14/2024 12:41:00 PM COMPARISON: 11/02/2023 CLINICAL HISTORY: Questionable sepsis - evaluate for abnormality FINDINGS: LIMITATIONS/ARTIFACTS: Mild limitations secondary to patient body habitus and AP portable technique. Patient rotated left. LUNGS AND PLEURA: No focal pulmonary opacity. No pulmonary edema. No pleural effusion. No pneumothorax. HEART AND MEDIASTINUM: No acute abnormality of the cardiac and mediastinal silhouettes. BONES AND SOFT TISSUES: No acute osseous abnormality. IMPRESSION: 1. No acute cardiopulmonary process 2. Mild study limitations due to body habitus, AP portable technique, and leftward rotation Electronically signed by: Rockey Kilts MD 03/14/2024 01:59 PM EST RP Workstation: [id number]     Procedures   Medications Ordered in the ED  lactated ringers  infusion  (has no administration in time range)  magnesium  sulfate IVPB 2 g 50 mL (2 g Intravenous New Bag/Given 03/14/24 1306)  vancomycin (VANCOCIN) IVPB 1000 mg/200 mL premix (has no administration in time range)    And  vancomycin (VANCOCIN) IVPB 1000 mg/200 mL premix (has no administration in time range)  lactated ringers  bolus 1,000 mL (0 mLs Intravenous Stopped 03/14/24 1411)  morphine (PF) 4 MG/ML injection 4 mg (4 mg Intravenous Given 03/14/24 1306)  acetaminophen  (TYLENOL ) tablet 1,000 mg (1,000 mg Oral Given 03/14/24 1312)  metoCLOPramide  (REGLAN ) injection 10 mg (10 mg Intravenous Given 03/14/24 1307)  ceFEPIme (MAXIPIME) 2 g in sodium chloride  0.9 % 100 mL IVPB (0 g Intravenous Stopped 03/14/24 1411)  metroNIDAZOLE  (FLAGYL ) IVPB 500 mg (0 mg Intravenous Stopped 03/14/24 1438)                                    Medical Decision Making Amount and/or Complexity of Data Reviewed Independent Historian: EMS External Data Reviewed: notes. Labs: ordered. Decision-making details documented in ED Course. Radiology: ordered and independent interpretation performed. Decision-making details documented in ED Course. ECG/medicine tests: ordered and independent interpretation performed. Decision-making details documented in ED Course.  Risk OTC drugs. Prescription drug management.   Pt with multiple medical problems and comorbidities and presenting today with a complaint that caries a high risk for morbidity and mortality.  Presenting today with the above complaint.  Patient's initial complaint is only of pain in her legs.  However concern for sepsis as patient has a temperature of 100.7 she is tachycardic and has soft blood pressures.  Prior history of similar and patient had pneumonia.  She is denying any URI symptoms or complaints of shortness of breath at this time.  There is no evidence on exam of lower extremity cellulitis and low suspicion for septic joint based on patient's exam findings.  She  denies any specific abdominal findings but reports she thinks she  may have diarrhea.  There is no abdominal tenderness on exam.  Will start sepsis order set with IV fluids, pain control, antipyretics.  I independently interpreted patient's EKG and labs.  EKG today shows a narrow sinus tachycardia but then goes into a wide sinus tachycardia which appears to be a bundle branch block.  She is going intermittently in and out of this rhythm without change in her symptoms.  When looking back over the chart in May when she was hospitalized for sepsis at that time she had very similar symptoms and it was felt to be related to hypomagnesemia.  Magnesium  was checked and she was given a dose of magnesium . Spoke with Dr. Lonni with cardiology who feels she is going into a rate related BBB.  No treatment needed at this time.  Will continue septic work up and abx.  Pt continues to deny SOB, Chest pain or palpitations.  CRITICAL CARE Performed by: Fredi Geiler Total critical care time: 30 minutes Critical care time was exclusive of separately billable procedures and treating other patients. Critical care was necessary to treat or prevent imminent or life-threatening deterioration. Critical care was time spent personally by me on the following activities: development of treatment plan with patient and/or surrogate as well as nursing, discussions with consultants, evaluation of patient's response to treatment, examination of patient, obtaining history from patient or surrogate, ordering and performing treatments and interventions, ordering and review of laboratory studies, ordering and review of radiographic studies, pulse oximetry and re-evaluation of patient's condition.      Final diagnoses:  Sepsis without acute organ dysfunction, due to unspecified organism [facility])    ED Discharge Orders     None          Doretha Folks, MD 03/14/24 1455

## 2024-03-14 NOTE — Sepsis Progress Note (Signed)
 Elink monitoring for the code sepsis protocol has ended.

## 2024-03-14 NOTE — Sepsis Progress Note (Signed)
 Elink monitoring for the code sepsis protocol.

## 2024-03-14 NOTE — ED Provider Notes (Signed)
  Physical Exam  BP (!) 124/59   Pulse (!) 123   Temp (!) 100.7 F (38.2 C) (Oral)   Resp 16   SpO2 99%   Physical Exam  Procedures  Procedures  ED Course / MDM   Clinical Course as of 03/14/24 1817  Fri Mar 14, 2024  1455 Assumed care from Dr Doretha. 63 yo F elevated BMI with hx of PE on coumadin . Having pain in legs and ankles and couldn't get out of the chair. Gets injections in her ankles. Couldn't go to her appointment because of the pain. She's septic with unclear source. Has been urinating on herself. Low concern for septic arthritis. No cellulitis. In May had similar presentation with PNA. Getting scanned. Given broad spectrum antibiotics. Has AKI. Has been going in and out of a bundle branch block. Was discussed with Dr Lonni from cards who does not feel she need additional intervention. Replenishing electrolytes including mag. Getting urine. Will need admission.  [RP]  1714 CT has returned.  Nonspecific perinephric stranding without other acute abnormality to explain the patient's symptoms.  Urinalysis with some blood but does not appear to be consistent with UTI. [RP]  1816 Discussed with Dr. Celinda from hospitalist will admit the patient.  Patient ordered for sliding scale insulin  as well. [RP]    Clinical Course User Index [RP] Yolande Lamar BROCKS, MD   Medical Decision Making Amount and/or Complexity of Data Reviewed Labs: ordered. Radiology: ordered.  Risk OTC drugs. Prescription drug management. Decision regarding hospitalization.      Yolande Lamar BROCKS, MD 03/14/24 [phone]

## 2024-03-14 NOTE — ED Triage Notes (Signed)
 Reports severe bilateral lower leg pain x 2 days. Unable to ambulate d/t pain. Tachy and febrile on arrival.

## 2024-03-15 DIAGNOSIS — M79675 Pain in left toe(s): Secondary | ICD-10-CM | POA: Diagnosis not present

## 2024-03-15 DIAGNOSIS — D259 Leiomyoma of uterus, unspecified: Secondary | ICD-10-CM | POA: Diagnosis present

## 2024-03-15 DIAGNOSIS — M25571 Pain in right ankle and joints of right foot: Secondary | ICD-10-CM | POA: Diagnosis present

## 2024-03-15 DIAGNOSIS — M79674 Pain in right toe(s): Secondary | ICD-10-CM | POA: Diagnosis not present

## 2024-03-15 DIAGNOSIS — D1779 Benign lipomatous neoplasm of other sites: Secondary | ICD-10-CM | POA: Diagnosis present

## 2024-03-15 DIAGNOSIS — D7389 Other diseases of spleen: Secondary | ICD-10-CM | POA: Diagnosis present

## 2024-03-15 DIAGNOSIS — Z794 Long term (current) use of insulin: Secondary | ICD-10-CM | POA: Diagnosis not present

## 2024-03-15 DIAGNOSIS — K760 Fatty (change of) liver, not elsewhere classified: Secondary | ICD-10-CM | POA: Diagnosis present

## 2024-03-15 DIAGNOSIS — Z7901 Long term (current) use of anticoagulants: Secondary | ICD-10-CM | POA: Diagnosis not present

## 2024-03-15 DIAGNOSIS — Z6841 Body Mass Index (BMI) 40.0 and over, adult: Secondary | ICD-10-CM | POA: Diagnosis not present

## 2024-03-15 DIAGNOSIS — M609 Myositis, unspecified: Secondary | ICD-10-CM | POA: Diagnosis present

## 2024-03-15 DIAGNOSIS — R6510 Systemic inflammatory response syndrome (SIRS) of non-infectious origin without acute organ dysfunction: Secondary | ICD-10-CM | POA: Diagnosis present

## 2024-03-15 DIAGNOSIS — I10 Essential (primary) hypertension: Secondary | ICD-10-CM | POA: Diagnosis present

## 2024-03-15 DIAGNOSIS — E785 Hyperlipidemia, unspecified: Secondary | ICD-10-CM | POA: Diagnosis present

## 2024-03-15 DIAGNOSIS — M7751 Other enthesopathy of right foot: Secondary | ICD-10-CM | POA: Diagnosis not present

## 2024-03-15 DIAGNOSIS — B351 Tinea unguium: Secondary | ICD-10-CM | POA: Diagnosis not present

## 2024-03-15 DIAGNOSIS — M19071 Primary osteoarthritis, right ankle and foot: Secondary | ICD-10-CM | POA: Diagnosis present

## 2024-03-15 DIAGNOSIS — M25572 Pain in left ankle and joints of left foot: Secondary | ICD-10-CM | POA: Diagnosis present

## 2024-03-15 DIAGNOSIS — Z7985 Long-term (current) use of injectable non-insulin antidiabetic drugs: Secondary | ICD-10-CM | POA: Diagnosis not present

## 2024-03-15 DIAGNOSIS — A419 Sepsis, unspecified organism: Secondary | ICD-10-CM

## 2024-03-15 DIAGNOSIS — D638 Anemia in other chronic diseases classified elsewhere: Secondary | ICD-10-CM | POA: Diagnosis present

## 2024-03-15 DIAGNOSIS — M7752 Other enthesopathy of left foot: Secondary | ICD-10-CM | POA: Diagnosis not present

## 2024-03-15 DIAGNOSIS — N179 Acute kidney failure, unspecified: Secondary | ICD-10-CM | POA: Diagnosis present

## 2024-03-15 DIAGNOSIS — E1165 Type 2 diabetes mellitus with hyperglycemia: Secondary | ICD-10-CM | POA: Diagnosis present

## 2024-03-15 DIAGNOSIS — M19072 Primary osteoarthritis, left ankle and foot: Secondary | ICD-10-CM | POA: Diagnosis not present

## 2024-03-15 DIAGNOSIS — Z9049 Acquired absence of other specified parts of digestive tract: Secondary | ICD-10-CM | POA: Diagnosis not present

## 2024-03-15 DIAGNOSIS — T380X5A Adverse effect of glucocorticoids and synthetic analogues, initial encounter: Secondary | ICD-10-CM | POA: Diagnosis present

## 2024-03-15 DIAGNOSIS — Z79899 Other long term (current) drug therapy: Secondary | ICD-10-CM | POA: Diagnosis not present

## 2024-03-15 LAB — CBC
HCT: 31.9 % — ABNORMAL LOW (ref 36.0–46.0)
Hemoglobin: 10.1 g/dL — ABNORMAL LOW (ref 12.0–15.0)
MCH: 27.3 pg (ref 26.0–34.0)
MCHC: 31.7 g/dL (ref 30.0–36.0)
MCV: 86.2 fL (ref 80.0–100.0)
Platelets: 345 K/uL (ref 150–400)
RBC: 3.7 MIL/uL — ABNORMAL LOW (ref 3.87–5.11)
RDW: 15.9 % — ABNORMAL HIGH (ref 11.5–15.5)
WBC: 20.2 K/uL — ABNORMAL HIGH (ref 4.0–10.5)
nRBC: 0 % (ref 0.0–0.2)

## 2024-03-15 LAB — GLUCOSE, CAPILLARY
Glucose-Capillary: 133 mg/dL — ABNORMAL HIGH (ref 70–99)
Glucose-Capillary: 128 mg/dL — ABNORMAL HIGH (ref 70–99)
Glucose-Capillary: 249 mg/dL — ABNORMAL HIGH (ref 70–99)
Glucose-Capillary: 198 mg/dL — ABNORMAL HIGH (ref 70–99)

## 2024-03-15 LAB — CREATININE, SERUM
Creatinine, Ser: 1.12 mg/dL — ABNORMAL HIGH (ref 0.44–1.00)
GFR, Estimated: 55 mL/min — ABNORMAL LOW (ref >60)

## 2024-03-15 LAB — HEMOGLOBIN A1C
Hgb A1c MFr Bld: 6.1 % — ABNORMAL HIGH (ref 4.8–5.6)
Mean Plasma Glucose: 128.37 mg/dL

## 2024-03-15 LAB — HIV ANTIBODY (ROUTINE TESTING W REFLEX): HIV Screen 4th Generation wRfx: NONREACTIVE

## 2024-03-15 LAB — C-REACTIVE PROTEIN: CRP: 27.7 mg/dL — ABNORMAL HIGH (ref <1.0)

## 2024-03-15 LAB — SEDIMENTATION RATE: Sed Rate: 110 mm/h — ABNORMAL HIGH (ref 0–22)

## 2024-03-15 MED ORDER — HYDROCHLOROTHIAZIDE 25 MG PO TABS
25.0000 mg | ORAL_TABLET | Freq: Every day | ORAL | Status: DC
  Administered 2024-03-15 – 2024-03-21 (×7): 25 mg via ORAL
  Filled 2024-03-15 (×7): qty 1

## 2024-03-15 MED ORDER — SODIUM CHLORIDE 0.9 % IV SOLN: INTRAVENOUS | Status: AC

## 2024-03-15 MED ORDER — AMLODIPINE BESYLATE 10 MG PO TABS
10.0000 mg | ORAL_TABLET | Freq: Every day | ORAL | Status: DC
  Administered 2024-03-15 – 2024-03-21 (×7): 10 mg via ORAL
  Filled 2024-03-15 (×7): qty 1

## 2024-03-15 MED ORDER — INSULIN GLARGINE-YFGN 100 UNIT/ML ~~LOC~~ SOLN: 25.0000 [IU] | Freq: Two times a day (BID) | SUBCUTANEOUS | Status: DC

## 2024-03-15 MED ORDER — INSULIN ASPART 100 UNIT/ML IJ SOLN
0.0000 [IU] | Freq: Three times a day (TID) | INTRAMUSCULAR | Status: DC
  Administered 2024-03-15 – 2024-03-16 (×2): 3 [IU] via SUBCUTANEOUS
  Administered 2024-03-16: 2 [IU] via SUBCUTANEOUS
  Administered 2024-03-16: 5 [IU] via SUBCUTANEOUS
  Administered 2024-03-17: 2 [IU] via SUBCUTANEOUS
  Administered 2024-03-17 (×2): 3 [IU] via SUBCUTANEOUS
  Administered 2024-03-18: 2 [IU] via SUBCUTANEOUS
  Administered 2024-03-18 (×2): 3 [IU] via SUBCUTANEOUS
  Administered 2024-03-19: 5 [IU] via SUBCUTANEOUS
  Administered 2024-03-19: 3 [IU] via SUBCUTANEOUS
  Administered 2024-03-19: 2 [IU] via SUBCUTANEOUS
  Administered 2024-03-20 (×2): 5 [IU] via SUBCUTANEOUS
  Administered 2024-03-20: 11 [IU] via SUBCUTANEOUS
  Filled 2024-03-15: qty 5
  Filled 2024-03-15: qty 2
  Filled 2024-03-15 (×2): qty 5
  Filled 2024-03-15 (×9): qty 3
  Filled 2024-03-15: qty 2
  Filled 2024-03-15: qty 5
  Filled 2024-03-15: qty 3

## 2024-03-15 MED ORDER — LOSARTAN POTASSIUM-HCTZ 100-25 MG PO TABS: 1.0000 | ORAL_TABLET | Freq: Every day | ORAL | Status: DC

## 2024-03-15 MED ORDER — SODIUM CHLORIDE 0.9 % IV SOLN
2.0000 g | Freq: Three times a day (TID) | INTRAVENOUS | Status: DC
  Administered 2024-03-15 – 2024-03-18 (×10): 2 g via INTRAVENOUS
  Filled 2024-03-15 (×10): qty 12.5

## 2024-03-15 MED ORDER — VANCOMYCIN HCL 1500 MG/300ML IV SOLN
1500.0000 mg | INTRAVENOUS | Status: DC
  Administered 2024-03-15 – 2024-03-17 (×3): 1500 mg via INTRAVENOUS
  Filled 2024-03-15 (×4): qty 300

## 2024-03-15 MED ORDER — WARFARIN - PHYSICIAN DOSING INPATIENT: Freq: Every day | Status: DC

## 2024-03-15 MED ORDER — LOSARTAN POTASSIUM 50 MG PO TABS
100.0000 mg | ORAL_TABLET | Freq: Every day | ORAL | Status: DC
  Administered 2024-03-15 – 2024-03-21 (×7): 100 mg via ORAL
  Filled 2024-03-15 (×7): qty 2

## 2024-03-15 MED ORDER — ROSUVASTATIN CALCIUM 5 MG PO TABS
10.0000 mg | ORAL_TABLET | Freq: Every day | ORAL | Status: DC
  Administered 2024-03-15 – 2024-03-21 (×7): 10 mg via ORAL
  Filled 2024-03-15 (×7): qty 2

## 2024-03-15 MED ORDER — ENOXAPARIN SODIUM 80 MG/0.8ML IJ SOSY
80.0000 mg | PREFILLED_SYRINGE | INTRAMUSCULAR | Status: DC
  Filled 2024-03-15: qty 0.8

## 2024-03-15 MED ORDER — WARFARIN SODIUM 5 MG PO TABS
5.0000 mg | ORAL_TABLET | Freq: Every day | ORAL | Status: DC
  Administered 2024-03-15 – 2024-03-17 (×3): 5 mg via ORAL
  Filled 2024-03-15 (×3): qty 1

## 2024-03-15 NOTE — Progress Notes (Signed)
 Pharmacy Antibiotic Note  Natasha Rollins is a 63 y.o. female admitted on 03/14/2024 with severe pain in legs/immobile.  Pharmacy has been consulted for vancomycin dosing for sepsis of unknown source.  -WBC 20, sCr 1.28 (bl~), Tmax 100.7 -Blood cultures collected  Plan: -Cefepime 2g IV every 8 hours -Vancomycin 2g IV x1 -Vancomycin 1500mg  IV every 24 hours (AUC 517, Vd 0.5, IBW, sCr 1.28) -Monitor renal function -Follow up signs of clinical improvement, LOT, de-escalation of antibiotics   Height: 5' 4 (162.6 cm) Weight: (!) 160 kg (352 lb 11.8 oz) (Wt from 01/15/2024) IBW/kg (Calculated) : 54.7  Temp (24hrs), Avg:99.9 F (37.7 C), Min:99.3 F (37.4 C), Max:100.7 F (38.2 C)  Recent Labs  Lab 03/14/24 1252 03/14/24 1258  WBC 20.7*  --   CREATININE 1.28*  --   LATICACIDVEN  --  1.3    Estimated Creatinine Clearance: 67.9 mL/min (A) (by C-G formula based on SCr of 1.28 mg/dL (H)).    Allergies  Allergen Reactions   Vitamin D     Other Reaction(s): Rash on face   Penicillins Itching and Rash   Saxagliptin Rash    Antimicrobials this admission: Cefepime 11/7 >>  Vanc 11/7 >>   Microbiology results: 11/7 BCx:   Thank you for allowing pharmacy to be a part of this patient's care.  Lynwood Poplar, PharmD, BCPS Clinical Pharmacist 03/15/2024 6:29 AM

## 2024-03-15 NOTE — Plan of Care (Signed)
   Problem: Education: Goal: Ability to describe self-care measures that may prevent or decrease complications (Diabetes Survival Skills Education) will improve Outcome: Progressing   Problem: Coping: Goal: Ability to adjust to condition or change in health will improve Outcome: Progressing   Problem: Fluid Volume: Goal: Ability to maintain a balanced intake and output will improve Outcome: Progressing

## 2024-03-15 NOTE — ED Notes (Signed)
 [facility] from [facility] called stating transport is on the way: 07:20

## 2024-03-15 NOTE — H&P (Signed)
 History and Physical    Patient: Natasha Rollins FMW:[id number] DOB: 05-25-1960 DOA: 03/14/2024 DOS: the patient was seen and examined on 03/15/2024 PCP: Verena Mems, MD  Patient coming from: Home  Chief Complaint:  Chief Complaint  Patient presents with   Leg Pain   HPI: Natasha Rollins is a 63 y.o. female with medical history significant of morbid obesity, HTN, and DM2 p/w R ankle pain and incidentally found to have elevated WBC iso fever/hypotension c/f sepsis of unknown etiology.  The patient presented with inability to walk, primarily due to a painful right ankle. The onset of symptoms was sudden, and the patient reported that the left ankle was also acting up, although the right ankle was significantly worse. The patient visited the emergency room the previous day, where tests indicated a high white blood cell count, suggesting an infection. The patient reported nausea and a decreased ability to eat and drink over the past few days, but denied any abdominal pain or discomfort during urination. Of note, the patient has a history of arthritis and received injections in the back, knees, and ankle. The patient had not received an ankle injection recently and was attempting to visit a podiatrist due to exacerbation of ankle pain, but instead decided to come to the ED for evaluation.  In the [facility] ED, pt febrile, hypotensive, and tachycardic. Labs notable for WBC 20.7-->20.2, and Cr 1.28-->1.12 (baseline 0.88 in 09/2023). UA w/ bacteruria. EDP started IV abx (vancomycin, cefepime, and flagyl ) and requested admission for sepsis eval.   Review of Systems: As mentioned in the history of present illness. All other systems reviewed and are negative. Past Medical History:  Diagnosis Date   Diabetes mellitus without complication (HCC)    Fat necrosis 05/28/2018   Hidradenitis suppurativa 05/28/2018   Hypertension    Morbid obesity (HCC)    Right second toe ulcer (HCC) 05/28/2018   Past  Surgical History:  Procedure Laterality Date   CESAREAN SECTION     x2   CHOLECYSTECTOMY     COLONOSCOPY WITH PROPOFOL  N/A 04/06/2022   Procedure: COLONOSCOPY WITH PROPOFOL ;  Surgeon: Federico Rosario BROCKS, MD;  Location: [facility]OSCOPY;  Service: Gastroenterology;  Laterality: N/A;   HERNIA REPAIR     POLYPECTOMY  04/06/2022   Procedure: POLYPECTOMY;  Surgeon: Federico Rosario BROCKS, MD;  Location: [facility];  Service: Gastroenterology;;   TONSILLECTOMY     Social History:  reports that she has never smoked. She has never used smokeless tobacco. She reports that she does not drink alcohol and does not use drugs.  Allergies  Allergen Reactions   Vitamin D     Other Reaction(s): Rash on face   Penicillins Itching and Rash   Saxagliptin Rash    Family History  Problem Relation Age of Onset   Colon cancer Neg Hx    Stomach cancer Neg Hx    Esophageal cancer Neg Hx    Colon polyps Neg Hx     Prior to Admission medications   Medication Sig Start Date End Date Taking? Authorizing Provider  amLODipine  (NORVASC ) 10 MG tablet Take 10 mg by mouth daily. 06/07/14  Yes [provider]  Continuous Glucose Sensor (DEXCOM G7 SENSOR) MISC as directed.   Yes [provider]  folic acid  (FOLVITE ) 1 MG tablet Take 1 mg by mouth daily.   Yes [provider]  insulin  aspart (NOVOLOG  FLEXPEN) 100 UNIT/ML FlexPen Inject 50 Units into the skin 3 (three) times daily with meals.   Yes [provider]  LANTUS  SOLOSTAR 100 UNIT/ML Solostar Pen Inject 30 Units into the skin 2 (two) times daily. 09/08/22  Yes [provider]  LEVEMIR  FLEXTOUCH 100 UNIT/ML Pen Inject 75 Units into the skin 2 (two) times daily.  07/28/14  Yes [provider]  losartan-hydrochlorothiazide  (HYZAAR) 100-25 MG tablet Take 1 tablet by mouth daily. 12/20/17  Yes [provider]  MOUNJARO 15 MG/0.5ML Pen Inject 15 mg into the skin once a week.   Yes [provider]  NOVOLOG   FLEXPEN 100 UNIT/ML FlexPen Inject 30 Units into the skin 3 (three) times daily with meals.  07/28/14  Yes [provider]  pregabalin  (LYRICA ) 50 MG capsule pregabalin  50 mg capsule  TAKE 1 CAPSULE BY MOUTH TWICE A DAY   Yes [provider]  rosuvastatin (CRESTOR) 10 MG tablet rosuvastatin 10 mg tablet  TAKE 1 TABLET BY MOUTH FOR 3 DAYS A WEEK 10/13/20  Yes [provider]  Vitamin D, Ergocalciferol, (DRISDOL) 1.25 MG (50000 UNIT) CAPS capsule Take 50,000 Units by mouth once a week. 03/22/22  Yes [provider]  warfarin (COUMADIN ) 5 MG tablet Take 1 tablet (5 mg total) by mouth daily at 6 PM. 09/11/16 03/14/24 Yes Levora Reyes SAUNDERS, MD  B-D UF III MINI PEN NEEDLES 31G X 5 MM MISC USE AS DIRECTED 5 TIMES A DAY 12/20/18   [provider]  Blood Glucose Monitoring Suppl (FREESTYLE FREEDOM LITE) w/Device KIT See admin instructions. 07/03/19   [provider]  Lancets (ONETOUCH DELICA PLUS [place]) MISC USE AS DIRECTED ONCE A DAY. 06/17/18   [provider]  [facility] VERIO test strip  11/26/18   [provider]    Physical Exam: Vitals:   03/15/24 0600 03/15/24 0609 03/15/24 0639 03/15/24 0858  BP: 125/61   (!) 112/54  Pulse: 94   96  Resp:    18  Temp:   98.9 F (37.2 C) 98.2 F (36.8 C)  TempSrc:    Oral  SpO2: 95%   97%  Weight:  (!) 160 kg    Height:  5' 4 (1.626 m)     General: Alert, oriented x3, resting comfortably in no acute distress HEENT: EOMI, oropharynx clear, moist mucous membranes, hearing intact Neck: Trachea midline and no gross thyromegaly Respiratory: Lungs clear to auscultation bilaterally with normal respiratory effort; no w/r/r Cardiovascular: Regular rate and rhythm w/o m/r/g Abdomen: Soft, nontender, nondistended. Positive bowel sounds MSK: No obvious joint deformities or swelling Skin: No obvious rashes or lesions Neurologic: Awake, alert, spontaneously moves all extremities, strength  intact Psychiatric: Appropriate mood and affect, conversational and cooperative   Data Reviewed:  Lab Results  Component Value Date   WBC 20.2 (H) 03/15/2024   HGB 10.1 (L) 03/15/2024   HCT 31.9 (L) 03/15/2024   MCV 86.2 03/15/2024   PLT 345 03/15/2024   Lab Results  Component Value Date   GLUCOSE 160 (H) 03/14/2024   CALCIUM 9.5 03/14/2024   NA 140 03/14/2024   K 3.4 (L) 03/14/2024   CO2 29 03/14/2024   CL 98 03/14/2024   BUN 22 03/14/2024   CREATININE 1.12 (H) 03/15/2024   Lab Results  Component Value Date   ALT 16 03/14/2024   AST 31 03/14/2024   ALKPHOS 64 03/14/2024   BILITOT 1.2 03/14/2024   Lab Results  Component Value Date   INR 2.0 (H) 03/14/2024   INR 2.4 (H) 09/30/2023   INR 2.1 (H) 09/29/2023   Radiology: CT CHEST ABDOMEN  PELVIS W CONTRAST Result Date: 03/14/2024 CLINICAL DATA:  Sepsis EXAM: CT CHEST, ABDOMEN, AND PELVIS WITH CONTRAST TECHNIQUE: Multidetector CT imaging of the chest, abdomen and pelvis was performed following the standard protocol during bolus administration of intravenous contrast. RADIATION DOSE REDUCTION: This exam was performed according to the departmental dose-optimization program which includes automated exposure control, adjustment of the mA and/or kV according to patient size and/or use of iterative reconstruction technique. CONTRAST:  OMNIPAQUE  IOHEXOL  300 MG/ML  SOLN COMPARISON:  Chest x-ray 03/14/2024, CT 09/25/2023, 09/26/2023, multiple prior exams dating back to March 12, 2018 FINDINGS: CT CHEST FINDINGS Cardiovascular: Mild atherosclerosis. No aneurysm. Mild coronary vascular calcification. Normal cardiac size. No pericardial effusion Mediastinum/Nodes: Patent trachea. Slightly enlarged thyroid  with multiple nodules. This has been evaluated on previous imaging. (ref: J Am Coll Radiol. 2015 Feb;12(2): 143-50). No suspicious lymph nodes. Esophagus within normal limits Lungs/Pleura: 6 no acute airspace disease, pleural effusion  or pneumothorax Musculoskeletal: Sternum appears intact. No acute osseous abnormality CT ABDOMEN PELVIS FINDINGS Hepatobiliary: Hepatic steatosis. Cholecystectomy. No biliary dilatation. Pancreas: Unremarkable. No pancreatic ductal dilatation or surrounding inflammatory changes. Spleen: Chronic 2.4 cm hypodense splenic lesion. Adrenals/Urinary Tract: Right adrenal gland is normal. 13 mm fat containing mass in the left adrenal, consistent with myelolipoma. Additional 2.3 cm fat containing mass in the left adrenal also consistent with myelolipoma. No imaging follow-up recommended. Nonspecific perinephric fat stranding. No hydronephrosis. The bladder is unremarkable. Delayed images for acquired however only include the pelvis and not the kidneys Stomach/Bowel: Stomach within normal limits. No dilated small bowel. Negative appendix. No acute bowel wall thickening Vascular/Lymphatic: Aortic atherosclerosis. No enlarged abdominal or pelvic lymph nodes. Reproductive: Calcified uterine mass consistent with a fibroid. No suspicious adnexal mass Other: Negative for ascites or free air Musculoskeletal: No acute or suspicious osseous lesion IMPRESSION: 1. No CT evidence for acute intrathoracic, intra-abdominal, or intrapelvic abnormality. 2. Hepatic steatosis. 3. Multiple stable chronic findings as described above. 4. Aortic atherosclerosis. Aortic Atherosclerosis (ICD10-I70.0). Electronically Signed   By: Luke Bun M.D.   On: 03/14/2024 16:19   DG Chest Port 1 View Result Date: 03/14/2024 EXAM: 1 VIEW(S) XRAY OF THE CHEST 03/14/2024 12:41:00 PM COMPARISON: 11/02/2023 CLINICAL HISTORY: Questionable sepsis - evaluate for abnormality FINDINGS: LIMITATIONS/ARTIFACTS: Mild limitations secondary to patient body habitus and AP portable technique. Patient rotated left. LUNGS AND PLEURA: No focal pulmonary opacity. No pulmonary edema. No pleural effusion. No pneumothorax. HEART AND MEDIASTINUM: No acute abnormality of the  cardiac and mediastinal silhouettes. BONES AND SOFT TISSUES: No acute osseous abnormality. IMPRESSION: 1. No acute cardiopulmonary process 2. Mild study limitations due to body habitus, AP portable technique, and leftward rotation Electronically signed by: Rockey Kilts MD 03/14/2024 01:59 PM EST RP Workstation: [id number]    Assessment and Plan: 52F h/o morbid obesity, HTN, and DM2 p/w R ankle pain and incidentally found to have elevated WBC iso fever/hypotension c/f sepsis of unknown etiology.  Sepsis of unknown etiology Presumably urinary in origin R ankle pain likely related to known OA and not OM -ID consulted; apprec eval/recs -Continue IV vancomycin/cefepime for now -MIVF: NS at 100cc/h for now -F/u ESR/CRP -F/u urine and blood cultures  R ankle pain -OP Podiatry f/u for steroid injection as planned  HTN -PTA amlodipine , hydrochlorothiazide , and losartan  HLD -PTA Crestor 10mg  daily  H/o VTE -PTA warfarin per pharmacy protocol   Advance Care Planning:   Code Status: Full Code   Consults: ID  Family Communication: Son  Severity of Illness: The appropriate patient  status for this patient is INPATIENT. Inpatient status is judged to be reasonable and necessary in order to provide the required intensity of service to ensure the patient's safety. The patient's presenting symptoms, physical exam findings, and initial radiographic and laboratory data in the context of their chronic comorbidities is felt to place them at high risk for further clinical deterioration. Furthermore, it is not anticipated that the patient will be medically stable for discharge from the hospital within 2 midnights of admission.   * I certify that at the point of admission it is my clinical judgment that the patient will require inpatient hospital care spanning beyond 2 midnights from the point of admission due to high intensity of service, high risk for further deterioration and high frequency of  surveillance required.*   ------- I spent 55 minutes reviewing previous notes, at the bedside counseling/discussing the treatment plan, and performing clinical documentation.  Author: Marsha Ada, MD 03/15/2024 12:13 PM  For on call review [email or web address].

## 2024-03-16 ENCOUNTER — Inpatient Hospital Stay (HOSPITAL_COMMUNITY)

## 2024-03-16 DIAGNOSIS — A419 Sepsis, unspecified organism: Secondary | ICD-10-CM | POA: Diagnosis not present

## 2024-03-16 LAB — GLUCOSE, CAPILLARY
Glucose-Capillary: 154 mg/dL — ABNORMAL HIGH (ref 70–99)
Glucose-Capillary: 144 mg/dL — ABNORMAL HIGH (ref 70–99)
Glucose-Capillary: 199 mg/dL — ABNORMAL HIGH (ref 70–99)
Glucose-Capillary: 209 mg/dL — ABNORMAL HIGH (ref 70–99)
Glucose-Capillary: 160 mg/dL — ABNORMAL HIGH (ref 70–99)

## 2024-03-16 LAB — PROTIME-INR
INR: 2.5 — ABNORMAL HIGH (ref 0.8–1.2)
Prothrombin Time: 28 s — ABNORMAL HIGH (ref 11.4–15.2)

## 2024-03-16 MED ORDER — MORPHINE SULFATE (PF) 2 MG/ML IV SOLN
2.0000 mg | INTRAVENOUS | Status: DC | PRN
  Administered 2024-03-16 – 2024-03-21 (×13): 2 mg via INTRAVENOUS
  Filled 2024-03-16 (×13): qty 1

## 2024-03-16 MED ORDER — WARFARIN - PHARMACIST DOSING INPATIENT: Freq: Every day | Status: DC

## 2024-03-16 NOTE — Progress Notes (Signed)
 PROGRESS NOTE    Natasha Rollins  FMW:[id number] DOB: 1961-01-01 DOA: 03/14/2024 PCP: Verena Mems, MD    Brief Narrative: This 63 years old female with PMH significant for Morbid obesity, Hypertension, type 2 diabetes presented in the ED with right ankle pain which was so severe and she was unable to walk.  She describes symptoms as sudden onset and has been getting worse.  She presented in the ED 1 day prior and tests indicated high white cell count suggesting an infection.  Patient was discharged home but she presented again with worsening symptoms.  Patient does have history of arthritis and has been receiving injections in the back,  knees and ankles.  In the ED She was febrile, hypotensive and tachycardic,  labs consistent with leukocytosis,  UA consistent with bacteriuria.  Patient was admitted for sepsis with unknown etiology ,  started on empiric antibiotics (Vancomycin, Cefepime and Flagyl  ).   Assessment & Plan:   Principal Problem:   Sepsis (HCC)   Sepsis of unknown etiology: Presumably urinary in origin. Patient has developed R ankle pain likely related to known OA and not OM. ID consulted; apprec eval/recs Continue IV vancomycin / Cefepime for now. Continue NS at 100cc/h for now ESR 110, CRP 27.7 both elevated Follow-up urine and blood cultures. De-escalate antibiotics as able to.  R ankle pain: Follow-up outpatient podiatry for steroid injection as planned.   HTN: Continue amlodipine , hydrochlorothiazide , and losartan   HLD: Continue  Crestor 10mg  daily   H/o VTE: Continue warfarin per pharmacy protocol  Morbid obesity: Diet and exercise discussed in detail.  DVT prophylaxis: Warfarin Code Status:Full Code Family Communication: No family at bed side Disposition Plan:    Status is: Inpatient Remains inpatient appropriate because: Severity of illness.    Consultants:  None  Procedures: None  Antimicrobials:  Anti-infectives (From admission,  onward)    Start     Dose/Rate Route Frequency Ordered Stop   03/15/24 1600  vancomycin (VANCOREADY) IVPB 1500 mg/300 mL        1,500 mg 150 mL/hr over 120 Minutes Intravenous Every 24 hours 03/15/24 0632     03/15/24 0630  ceFEPIme (MAXIPIME) 2 g in sodium chloride  0.9 % 100 mL IVPB        2 g 200 mL/hr over 30 Minutes Intravenous Every 8 hours 03/15/24 0616     03/14/24 1430  vancomycin (VANCOCIN) IVPB 1000 mg/200 mL premix       Placed in And Linked Group   1,000 mg 200 mL/hr over 60 Minutes Intravenous  Once 03/14/24 1307 03/14/24 1811   03/14/24 1315  ceFEPIme (MAXIPIME) 2 g in sodium chloride  0.9 % 100 mL IVPB        2 g 200 mL/hr over 30 Minutes Intravenous  Once 03/14/24 1307 03/14/24 1411   03/14/24 1315  metroNIDAZOLE  (FLAGYL ) IVPB 500 mg        500 mg 100 mL/hr over 60 Minutes Intravenous  Once 03/14/24 1307 03/14/24 1438   03/14/24 1315  vancomycin (VANCOCIN) IVPB 1000 mg/200 mL premix       Placed in And Linked Group   1,000 mg 200 mL/hr over 60 Minutes Intravenous  Once 03/14/24 1307 03/14/24 1707      Subjective: Patient was seen and examined at bedside.  Overnight events noted. Patient reports feeling better, but complains of having severe pain in both knees and ankles.  Objective: Vitals:   03/15/24 2231 03/15/24 2333 03/16/24 0415 03/16/24 0725  BP:  ROLLEN)  112/55 (!) 100/57 (!) 113/58  Pulse: 96 98 94 91  Resp:  16 13 16   Temp:  99.4 F (37.4 C) 98.5 F (36.9 C) 98.6 F (37 C)  TempSrc:   Oral Oral  SpO2: 95% 97% 97% 100%  Weight:      Height:        Intake/Output Summary (Last 24 hours) at 03/16/2024 1040 Last data filed at 03/16/2024 9375 Gross per 24 hour  Intake 855.36 ml  Output 600 ml  Net 255.36 ml   Filed Weights   03/15/24 0609  Weight: (!) 160 kg    Examination:  General exam: Appears calm and comfortable, not in any acute distress. Respiratory system: CTA Bilaterally. Respiratory effort normal.  RR 14 Cardiovascular system: S1  & S2 heard, RRR. No JVD, murmurs, rubs, gallops or clicks. Gastrointestinal system: Abdomen is nondistended, soft and nontender. Normal bowel sounds heard. Central nervous system: Alert and oriented x 3. No focal neurological deficits. Extremities: Tenderness is noted in the both ankles. Skin: No rashes, lesions or ulcers Psychiatry: Judgement and insight appear normal. Mood & affect appropriate.     Data Reviewed: I have personally reviewed following labs and imaging studies  CBC: Recent Labs  Lab 03/14/24 1252 03/15/24 1110  WBC 20.7* 20.2*  NEUTROABS 17.7*  --   HGB 9.8* 10.1*  HCT 31.9* 31.9*  MCV 86.7 86.2  PLT 367 345   Basic Metabolic Panel: Recent Labs  Lab 03/14/24 1252 03/15/24 1110  NA 140  --   K 3.4*  --   CL 98  --   CO2 29  --   GLUCOSE 160*  --   BUN 22  --   CREATININE 1.28* 1.12*  CALCIUM 9.5  --   MG 1.5*  --    GFR: Estimated Creatinine Clearance: 77.5 mL/min (A) (by C-G formula based on SCr of 1.12 mg/dL (H)). Liver Function Tests: Recent Labs  Lab 03/14/24 1252  AST 31  ALT 16  ALKPHOS 64  BILITOT 1.2  PROT 7.3  ALBUMIN 3.6   No results for input(s): LIPASE, AMYLASE in the last 168 hours. No results for input(s): AMMONIA in the last 168 hours. Coagulation Profile: Recent Labs  Lab 03/14/24 1252 03/16/24 0510  INR 2.0* 2.5*   Cardiac Enzymes: No results for input(s): CKTOTAL, CKMB, CKMBINDEX, TROPONINI in the last 168 hours. BNP (last 3 results) No results for input(s): PROBNP in the last 8760 hours. HbA1C: Recent Labs    03/15/24 1148  HGBA1C 6.1*   CBG: Recent Labs  Lab 03/15/24 1154 03/15/24 1620 03/15/24 2112 03/16/24 0614 03/16/24 0903  GLUCAP 128* 249* 198* 154* 144*   Lipid Profile: No results for input(s): CHOL, HDL, LDLCALC, TRIG, CHOLHDL, LDLDIRECT in the last 72 hours. Thyroid  Function Tests: No results for input(s): TSH, T4TOTAL, FREET4, T3FREE, THYROIDAB in the  last 72 hours. Anemia Panel: No results for input(s): VITAMINB12, FOLATE, FERRITIN, TIBC, IRON, RETICCTPCT in the last 72 hours. Sepsis Labs: Recent Labs  Lab 03/14/24 1258  LATICACIDVEN 1.3    Recent Results (from the past 240 hours)  Blood Culture (routine x 2)     Status: None (Preliminary result)   Collection Time: 03/14/24 12:14 PM   Specimen: BLOOD  Result Value Ref Range Status   Specimen Description   Final    BLOOD LEFT ANTECUBITAL Performed at [facility], [address] [place], [place] [address]    Special Requests   Final    BOTTLES DRAWN  AEROBIC AND ANAEROBIC Blood Culture adequate volume   Culture   Final    NO GROWTH 2 DAYS Performed at [facility], [address], [place], [place] [address]    Report Status PENDING  Incomplete  Blood Culture (routine x 2)     Status: None (Preliminary result)   Collection Time: 03/14/24 12:19 PM   Specimen: BLOOD RIGHT FOREARM  Result Value Ref Range Status   Specimen Description BLOOD RIGHT FOREARM  Final   Special Requests   Final    BOTTLES DRAWN AEROBIC AND ANAEROBIC Blood Culture adequate volume   Culture   Final    NO GROWTH 2 DAYS Performed at [facility], [address] [place], [place] [address]    Report Status PENDING  Incomplete  Resp panel by RT-PCR (RSV, Flu A&B, Covid) Anterior Nasal Swab     Status: None   Collection Time: 03/14/24 12:52 PM   Specimen: Anterior Nasal Swab  Result Value Ref Range Status   SARS Coronavirus 2 by RT PCR NEGATIVE NEGATIVE Final    Comment: (NOTE) SARS-CoV-2 target nucleic acids are NOT DETECTED.  The SARS-CoV-2 RNA is generally detectable in upper respiratory specimens during the acute phase of infection. The lowest concentration of SARS-CoV-2 viral copies this assay can detect is 138 copies/mL. A negative result does not preclude SARS-Cov-2 infection and should not be used as the sole basis for treatment or other patient  management decisions. A negative result may occur with  improper specimen collection/handling, submission of specimen other than nasopharyngeal swab, presence of viral mutation(s) within the areas targeted by this assay, and inadequate number of viral copies(<138 copies/mL). A negative result must be combined with clinical observations, patient history, and epidemiological information. The expected result is Negative.  Fact Sheet for Patients:  bloggercourse.com  Fact Sheet for Healthcare Providers:  seriousbroker.it  This test is no t yet approved or cleared by the [place]  FDA and  has been authorized for detection and/or diagnosis of SARS-CoV-2 by FDA under an Emergency Use Authorization (EUA). This EUA will remain  in effect (meaning this test can be used) for the duration of the COVID-19 declaration under Section 564(b)(1) of the Act, 21 U.S.C.section 360bbb-3(b)(1), unless the authorization is terminated  or revoked sooner.       Influenza A by PCR NEGATIVE NEGATIVE Final   Influenza B by PCR NEGATIVE NEGATIVE Final    Comment: (NOTE) The Xpert Xpress SARS-CoV-2/FLU/RSV plus assay is intended as an aid in the diagnosis of influenza from Nasopharyngeal swab specimens and should not be used as a sole basis for treatment. Nasal washings and aspirates are unacceptable for Xpert Xpress SARS-CoV-2/FLU/RSV testing.  Fact Sheet for Patients: bloggercourse.com  Fact Sheet for Healthcare Providers: seriousbroker.it  This test is not yet approved or cleared by the [place]  FDA and has been authorized for detection and/or diagnosis of SARS-CoV-2 by FDA under an Emergency Use Authorization (EUA). This EUA will remain in effect (meaning this test can be used) for the duration of the COVID-19 declaration under Section 564(b)(1) of the Act, 21 U.S.C. section 360bbb-3(b)(1),  unless the authorization is terminated or revoked.     Resp Syncytial Virus by PCR NEGATIVE NEGATIVE Final    Comment: (NOTE) Fact Sheet for Patients: bloggercourse.com  Fact Sheet for Healthcare Providers: seriousbroker.it  This test is not yet approved or cleared by the [place]  FDA and has been authorized for detection and/or diagnosis of SARS-CoV-2 by FDA under  an Emergency Use Authorization (EUA). This EUA will remain in effect (meaning this test can be used) for the duration of the COVID-19 declaration under Section 564(b)(1) of the Act, 21 U.S.C. section 360bbb-3(b)(1), unless the authorization is terminated or revoked.  Performed at [facility], [address] [place], [place] [address]     Radiology Studies: CT CHEST ABDOMEN PELVIS W CONTRAST Result Date: 03/14/2024 CLINICAL DATA:  Sepsis EXAM: CT CHEST, ABDOMEN, AND PELVIS WITH CONTRAST TECHNIQUE: Multidetector CT imaging of the chest, abdomen and pelvis was performed following the standard protocol during bolus administration of intravenous contrast. RADIATION DOSE REDUCTION: This exam was performed according to the departmental dose-optimization program which includes automated exposure control, adjustment of the mA and/or kV according to patient size and/or use of iterative reconstruction technique. CONTRAST:  OMNIPAQUE  IOHEXOL  300 MG/ML  SOLN COMPARISON:  Chest x-ray 03/14/2024, CT 09/25/2023, 09/26/2023, multiple prior exams dating back to March 12, 2018 FINDINGS: CT CHEST FINDINGS Cardiovascular: Mild atherosclerosis. No aneurysm. Mild coronary vascular calcification. Normal cardiac size. No pericardial effusion Mediastinum/Nodes: Patent trachea. Slightly enlarged thyroid  with multiple nodules. This has been evaluated on previous imaging. (ref: J Am Coll Radiol. 2015 Feb;12(2): 143-50). No suspicious lymph nodes. Esophagus within normal  limits Lungs/Pleura: 6 no acute airspace disease, pleural effusion or pneumothorax Musculoskeletal: Sternum appears intact. No acute osseous abnormality CT ABDOMEN PELVIS FINDINGS Hepatobiliary: Hepatic steatosis. Cholecystectomy. No biliary dilatation. Pancreas: Unremarkable. No pancreatic ductal dilatation or surrounding inflammatory changes. Spleen: Chronic 2.4 cm hypodense splenic lesion. Adrenals/Urinary Tract: Right adrenal gland is normal. 13 mm fat containing mass in the left adrenal, consistent with myelolipoma. Additional 2.3 cm fat containing mass in the left adrenal also consistent with myelolipoma. No imaging follow-up recommended. Nonspecific perinephric fat stranding. No hydronephrosis. The bladder is unremarkable. Delayed images for acquired however only include the pelvis and not the kidneys Stomach/Bowel: Stomach within normal limits. No dilated small bowel. Negative appendix. No acute bowel wall thickening Vascular/Lymphatic: Aortic atherosclerosis. No enlarged abdominal or pelvic lymph nodes. Reproductive: Calcified uterine mass consistent with a fibroid. No suspicious adnexal mass Other: Negative for ascites or free air Musculoskeletal: No acute or suspicious osseous lesion IMPRESSION: 1. No CT evidence for acute intrathoracic, intra-abdominal, or intrapelvic abnormality. 2. Hepatic steatosis. 3. Multiple stable chronic findings as described above. 4. Aortic atherosclerosis. Aortic Atherosclerosis (ICD10-I70.0). Electronically Signed   By: Luke Bun M.D.   On: 03/14/2024 16:19   DG Chest Port 1 View Result Date: 03/14/2024 EXAM: 1 VIEW(S) XRAY OF THE CHEST 03/14/2024 12:41:00 PM COMPARISON: 11/02/2023 CLINICAL HISTORY: Questionable sepsis - evaluate for abnormality FINDINGS: LIMITATIONS/ARTIFACTS: Mild limitations secondary to patient body habitus and AP portable technique. Patient rotated left. LUNGS AND PLEURA: No focal pulmonary opacity. No pulmonary edema. No pleural effusion. No  pneumothorax. HEART AND MEDIASTINUM: No acute abnormality of the cardiac and mediastinal silhouettes. BONES AND SOFT TISSUES: No acute osseous abnormality. IMPRESSION: 1. No acute cardiopulmonary process 2. Mild study limitations due to body habitus, AP portable technique, and leftward rotation Electronically signed by: Rockey Kilts MD 03/14/2024 01:59 PM EST RP Workstation: [id number]   Scheduled Meds:  amLODipine   10 mg Oral Daily   losartan  100 mg Oral Daily   And   hydrochlorothiazide   25 mg Oral Daily   insulin  aspart  0-15 Units Subcutaneous TID WC   rosuvastatin  10 mg Oral Daily   warfarin  5 mg Oral q1600   Warfarin - Physician Dosing Inpatient   Does not apply q1600  Continuous Infusions:  sodium chloride  Stopped (03/15/24 1536)   ceFEPime (MAXIPIME) IV 2 g (03/16/24 0606)   vancomycin Stopped (03/15/24 2043)     LOS: 1 day    Time spent: 50 mins    Darcel Dawley, MD [facility]   If 7PM-7AM, please contact night-coverage

## 2024-03-16 NOTE — Progress Notes (Signed)
 Patient complained of severe pain to BLE the right side 10/10 and the left side 8/10.    [facility] contacted and order for one time dose of morphine given as ordered. Patient states pain is much better at 7/10.  Patient tearful at times due to the pain.  Ongoing monitoring of patient and patient educated on pain medications. Respirations even and unlabored. Bed low position, call light in reach

## 2024-03-16 NOTE — Plan of Care (Signed)
  Problem: Coping: Goal: Ability to adjust to condition or change in health will improve Outcome: Progressing   Problem: Health Behavior/Discharge Planning: Goal: Ability to manage health-related needs will improve Outcome: Progressing   Problem: Metabolic: Goal: Ability to maintain appropriate glucose levels will improve Outcome: Progressing

## 2024-03-16 NOTE — Progress Notes (Signed)
 PHARMACY - ANTICOAGULATION CONSULT NOTE  Pharmacy Consult for warfarin Indication: Hx of VTE 2019  Allergies  Allergen Reactions   Vitamin D     Other Reaction(s): Rash on face   Penicillins Itching and Rash   Saxagliptin Rash    Patient Measurements: Height: 5' 4 (162.6 cm) Weight: (!) 160 kg (352 lb 11.8 oz) (Wt from 01/15/2024) IBW/kg (Calculated) : 54.7 HEPARIN  DW (KG): 95.9  Vital Signs: Temp: 98.3 F (36.8 C) (11/09 1108) Temp Source: Oral (11/09 1108) BP: 118/65 (11/09 1108) Pulse Rate: 94 (11/09 1108)  Labs: Recent Labs    03/14/24 1252 03/15/24 1110 03/16/24 0510  HGB 9.8* 10.1*  --   HCT 31.9* 31.9*  --   PLT 367 345  --   LABPROT 23.6*  --  28.0*  INR 2.0*  --  2.5*  CREATININE 1.28* 1.12*  --     Estimated Creatinine Clearance: 77.5 mL/min (A) (by C-G formula based on SCr of 1.12 mg/dL (H)).   Medical History: Past Medical History:  Diagnosis Date   Diabetes mellitus without complication (HCC)    Fat necrosis 05/28/2018   Hidradenitis suppurativa 05/28/2018   Hypertension    Morbid obesity (HCC)    Right second toe ulcer (HCC) 05/28/2018    Assessment: Pharmacy consulted to dose warfarin during inpatient admission. Patient presented to the ED with a chief complaint of right ankle pain and admitted for sepsis with unknown etiology. Upon chart review, patient is on warfarin 5 mg daily PTA for history of VTE in 2019. INR upon admission was therapeutic at 2.   Today's INR remains therapeutic at 2.5. CBC appears numerically stable (Hgb 10s, platelet wnl) and no documented signs of bleeding. Will continue usual home dose of warfarin 5 mg daily for now given INR within goal range.   Goal of Therapy:  INR 2-3 Monitor platelets by anticoagulation protocol: Yes   Plan:  - Continue warfarin 5 mg daily  - Monitor daily INR and signs/symptoms of bleeding  Herlinda Heady S Lathan Gieselman 03/16/2024,11:30 AM

## 2024-03-17 DIAGNOSIS — A419 Sepsis, unspecified organism: Secondary | ICD-10-CM | POA: Diagnosis not present

## 2024-03-17 LAB — GLUCOSE, CAPILLARY
Glucose-Capillary: 151 mg/dL — ABNORMAL HIGH (ref 70–99)
Glucose-Capillary: 171 mg/dL — ABNORMAL HIGH (ref 70–99)
Glucose-Capillary: 148 mg/dL — ABNORMAL HIGH (ref 70–99)
Glucose-Capillary: 184 mg/dL — ABNORMAL HIGH (ref 70–99)
Glucose-Capillary: 260 mg/dL — ABNORMAL HIGH (ref 70–99)

## 2024-03-17 LAB — CBC
HCT: 30.5 % — ABNORMAL LOW (ref 36.0–46.0)
Hemoglobin: 9.7 g/dL — ABNORMAL LOW (ref 12.0–15.0)
MCH: 26.8 pg (ref 26.0–34.0)
MCHC: 31.8 g/dL (ref 30.0–36.0)
MCV: 84.3 fL (ref 80.0–100.0)
Platelets: 460 K/uL — ABNORMAL HIGH (ref 150–400)
RBC: 3.62 MIL/uL — ABNORMAL LOW (ref 3.87–5.11)
RDW: 15.5 % (ref 11.5–15.5)
WBC: 14.6 K/uL — ABNORMAL HIGH (ref 4.0–10.5)
nRBC: 0 % (ref 0.0–0.2)

## 2024-03-17 LAB — BASIC METABOLIC PANEL WITH GFR
Anion gap: 10 (ref 5–15)
BUN: 22 mg/dL (ref 8–23)
CO2: 27 mmol/L (ref 22–32)
Calcium: 8.6 mg/dL — ABNORMAL LOW (ref 8.9–10.3)
Chloride: 100 mmol/L (ref 98–111)
Creatinine, Ser: 1.1 mg/dL — ABNORMAL HIGH (ref 0.44–1.00)
GFR, Estimated: 56 mL/min — ABNORMAL LOW (ref >60)
Glucose, Bld: 142 mg/dL — ABNORMAL HIGH (ref 70–99)
Potassium: 3.7 mmol/L (ref 3.5–5.1)
Sodium: 137 mmol/L (ref 135–145)

## 2024-03-17 LAB — PHOSPHORUS: Phosphorus: 2.4 mg/dL — ABNORMAL LOW (ref 2.5–4.6)

## 2024-03-17 LAB — PROTIME-INR
INR: 2.4 — ABNORMAL HIGH (ref 0.8–1.2)
Prothrombin Time: 26.9 s — ABNORMAL HIGH (ref 11.4–15.2)

## 2024-03-17 LAB — MAGNESIUM: Magnesium: 1.6 mg/dL — ABNORMAL LOW (ref 1.7–2.4)

## 2024-03-17 LAB — MRSA NEXT GEN BY PCR, NASAL: MRSA by PCR Next Gen: NOT DETECTED

## 2024-03-17 NOTE — Plan of Care (Signed)
  Problem: Coping: Goal: Ability to adjust to condition or change in health will improve Outcome: Progressing   Problem: Health Behavior/Discharge Planning: Goal: Ability to manage health-related needs will improve Outcome: Progressing   Problem: Skin Integrity: Goal: Risk for impaired skin integrity will decrease Outcome: Progressing

## 2024-03-17 NOTE — Progress Notes (Signed)
 PROGRESS NOTE    Natasha Rollins  FMW:[id number] DOB: 11-Jul-1960 DOA: 03/14/2024 PCP: Verena Mems, MD    Brief Narrative: This 63 years old female with PMH significant for Morbid obesity, Hypertension, type 2 diabetes presented in the ED with right ankle pain which was so severe and she was unable to walk.  She describes symptoms as sudden onset and has been getting worse.  She presented in the ED 1 day prior and tests indicated high white cell count suggesting an infection.  Patient was discharged home but she presented again with worsening symptoms.  Patient does have history of arthritis and has been receiving injections in the back,  knees and ankles.  In the ED She was febrile, hypotensive and tachycardic,  labs consistent with leukocytosis,  UA consistent with bacteriuria.  Patient was admitted for sepsis with unknown etiology ,  started on empiric antibiotics (Vancomycin, Cefepime and Flagyl  ).   Assessment & Plan:   Principal Problem:   Sepsis (HCC)   Sepsis of unknown etiology: Presumably urinary in origin. Patient has developed R ankle pain likely related to known OA and not OM. Continue IV vancomycin / Cefepime for now. Continue NS at 100cc/h for now. ESR 110, CRP 27.7 both elevated Blood cultures > No growth to date. De-escalate antibiotics as able to.  R ankle pain: Follow-up outpatient podiatry for steroid injection as planned. Xray > No acute osseous abnormality.  Subcutaneous soft tissue edema. Continue pain control as needed.   HTN: Continue amlodipine , hydrochlorothiazide , and losartan.   HLD: Continue  Crestor 10mg  daily.   H/o VTE: Continue warfarin per pharmacy protocol  Morbid obesity: Diet and exercise discussed in detail.  DVT prophylaxis: Warfarin Code Status:Full Code Family Communication: No family at bed side Disposition Plan:    Status is: Inpatient Remains inpatient appropriate because: Patient admitted for sepsis secondary to  unknown cause.  Blood cultures so far negative.   Anticipated discharge home 03/18/2024.  Consultants:  None  Procedures: None  Antimicrobials:  Anti-infectives (From admission, onward)    Start     Dose/Rate Route Frequency Ordered Stop   03/15/24 1600  vancomycin (VANCOREADY) IVPB 1500 mg/300 mL        1,500 mg 150 mL/hr over 120 Minutes Intravenous Every 24 hours 03/15/24 0632     03/15/24 0630  ceFEPIme (MAXIPIME) 2 g in sodium chloride  0.9 % 100 mL IVPB        2 g 200 mL/hr over 30 Minutes Intravenous Every 8 hours 03/15/24 0616     03/14/24 1430  vancomycin (VANCOCIN) IVPB 1000 mg/200 mL premix       Placed in And Linked Group   1,000 mg 200 mL/hr over 60 Minutes Intravenous  Once 03/14/24 1307 03/14/24 1811   03/14/24 1315  ceFEPIme (MAXIPIME) 2 g in sodium chloride  0.9 % 100 mL IVPB        2 g 200 mL/hr over 30 Minutes Intravenous  Once 03/14/24 1307 03/14/24 1411   03/14/24 1315  metroNIDAZOLE  (FLAGYL ) IVPB 500 mg        500 mg 100 mL/hr over 60 Minutes Intravenous  Once 03/14/24 1307 03/14/24 1438   03/14/24 1315  vancomycin (VANCOCIN) IVPB 1000 mg/200 mL premix       Placed in And Linked Group   1,000 mg 200 mL/hr over 60 Minutes Intravenous  Once 03/14/24 1307 03/14/24 1707      Subjective: Patient was seen and examined at bedside.  Overnight events noted. Patient reports feeling  better, but complains of having severe pain in both knees and ankles. She requests podiatry consult for scheduled ankle injection.  Objective: Vitals:   03/16/24 1408 03/16/24 2000 03/17/24 0504 03/17/24 0713  BP: 101/80 113/63 117/64 136/60  Pulse: 95 95 88 81  Resp: 16 16 18 16   Temp: 98.1 F (36.7 C) 98.3 F (36.8 C) 97.6 F (36.4 C) 98 F (36.7 C)  TempSrc: Oral Oral Oral Oral  SpO2: 100% 97% 97% 100%  Weight:      Height:        Intake/Output Summary (Last 24 hours) at 03/17/2024 1322 Last data filed at 03/17/2024 0600 Gross per 24 hour  Intake 436.67 ml   Output 900 ml  Net -463.33 ml   Filed Weights   03/15/24 0609  Weight: (!) 160 kg    Examination:  General exam: Appears calm and comfortable, not in any acute distress. Respiratory system: CTA Bilaterally. Respiratory effort normal.  RR 13 Cardiovascular system: S1 & S2 heard, RRR. No JVD, murmurs, rubs, gallops or clicks. Gastrointestinal system: Abdomen is nondistended, soft and nontender. Normal bowel sounds heard. Central nervous system: Alert and oriented x 3. No focal neurological deficits. Extremities: Tenderness is noted in the both ankles. Skin: No rashes, lesions or ulcers Psychiatry: Judgement and insight appear normal. Mood & affect appropriate.     Data Reviewed: I have personally reviewed following labs and imaging studies  CBC: Recent Labs  Lab 03/14/24 1252 03/15/24 1110 03/17/24 0634  WBC 20.7* 20.2* 14.6*  NEUTROABS 17.7*  --   --   HGB 9.8* 10.1* 9.7*  HCT 31.9* 31.9* 30.5*  MCV 86.7 86.2 84.3  PLT 367 345 460*   Basic Metabolic Panel: Recent Labs  Lab 03/14/24 1252 03/15/24 1110  NA 140  --   K 3.4*  --   CL 98  --   CO2 29  --   GLUCOSE 160*  --   BUN 22  --   CREATININE 1.28* 1.12*  CALCIUM 9.5  --   MG 1.5*  --    GFR: Estimated Creatinine Clearance: 77.5 mL/min (A) (by C-G formula based on SCr of 1.12 mg/dL (H)). Liver Function Tests: Recent Labs  Lab 03/14/24 1252  AST 31  ALT 16  ALKPHOS 64  BILITOT 1.2  PROT 7.3  ALBUMIN 3.6   No results for input(s): LIPASE, AMYLASE in the last 168 hours. No results for input(s): AMMONIA in the last 168 hours. Coagulation Profile: Recent Labs  Lab 03/14/24 1252 03/16/24 0510 03/17/24 0634  INR 2.0* 2.5* 2.4*   Cardiac Enzymes: No results for input(s): CKTOTAL, CKMB, CKMBINDEX, TROPONINI in the last 168 hours. BNP (last 3 results) No results for input(s): PROBNP in the last 8760 hours. HbA1C: Recent Labs    03/15/24 1148  HGBA1C 6.1*   CBG: Recent Labs   Lab 03/16/24 1636 03/16/24 2109 03/17/24 0624 03/17/24 0816 03/17/24 1114  GLUCAP 209* 160* 151* 171* 148*   Lipid Profile: No results for input(s): CHOL, HDL, LDLCALC, TRIG, CHOLHDL, LDLDIRECT in the last 72 hours. Thyroid  Function Tests: No results for input(s): TSH, T4TOTAL, FREET4, T3FREE, THYROIDAB in the last 72 hours. Anemia Panel: No results for input(s): VITAMINB12, FOLATE, FERRITIN, TIBC, IRON, RETICCTPCT in the last 72 hours. Sepsis Labs: Recent Labs  Lab 03/14/24 1258  LATICACIDVEN 1.3    Recent Results (from the past 240 hours)  Blood Culture (routine x 2)     Status: None (Preliminary result)   Collection Time: 03/14/24  12:14 PM   Specimen: BLOOD  Result Value Ref Range Status   Specimen Description   Final    BLOOD LEFT ANTECUBITAL Performed at [facility], [address] [place], [place] [address]    Special Requests   Final    BOTTLES DRAWN AEROBIC AND ANAEROBIC Blood Culture adequate volume   Culture   Final    NO GROWTH 3 DAYS Performed at [facility], [address] [place], [place] [address]    Report Status PENDING  Incomplete  Blood Culture (routine x 2)     Status: None (Preliminary result)   Collection Time: 03/14/24 12:19 PM   Specimen: BLOOD RIGHT FOREARM  Result Value Ref Range Status   Specimen Description BLOOD RIGHT FOREARM  Final   Special Requests   Final    BOTTLES DRAWN AEROBIC AND ANAEROBIC Blood Culture adequate volume   Culture   Final    NO GROWTH 3 DAYS Performed at [facility], [address], [place], [place] [address]    Report Status PENDING  Incomplete  Resp panel by RT-PCR (RSV, Flu A&B, Covid) Anterior Nasal Swab     Status: None   Collection Time: 03/14/24 12:52 PM   Specimen: Anterior Nasal Swab  Result Value Ref Range Status   SARS Coronavirus 2 by RT PCR NEGATIVE NEGATIVE Final    Comment: (NOTE) SARS-CoV-2 target nucleic acids are NOT  DETECTED.  The SARS-CoV-2 RNA is generally detectable in upper respiratory specimens during the acute phase of infection. The lowest concentration of SARS-CoV-2 viral copies this assay can detect is 138 copies/mL. A negative result does not preclude SARS-Cov-2 infection and should not be used as the sole basis for treatment or other patient management decisions. A negative result may occur with  improper specimen collection/handling, submission of specimen other than nasopharyngeal swab, presence of viral mutation(s) within the areas targeted by this assay, and inadequate number of viral copies(<138 copies/mL). A negative result must be combined with clinical observations, patient history, and epidemiological information. The expected result is Negative.  Fact Sheet for Patients:  bloggercourse.com  Fact Sheet for Healthcare Providers:  seriousbroker.it  This test is no t yet approved or cleared by the [place]  FDA and  has been authorized for detection and/or diagnosis of SARS-CoV-2 by FDA under an Emergency Use Authorization (EUA). This EUA will remain  in effect (meaning this test can be used) for the duration of the COVID-19 declaration under Section 564(b)(1) of the Act, 21 U.S.C.section 360bbb-3(b)(1), unless the authorization is terminated  or revoked sooner.       Influenza A by PCR NEGATIVE NEGATIVE Final   Influenza B by PCR NEGATIVE NEGATIVE Final    Comment: (NOTE) The Xpert Xpress SARS-CoV-2/FLU/RSV plus assay is intended as an aid in the diagnosis of influenza from Nasopharyngeal swab specimens and should not be used as a sole basis for treatment. Nasal washings and aspirates are unacceptable for Xpert Xpress SARS-CoV-2/FLU/RSV testing.  Fact Sheet for Patients: bloggercourse.com  Fact Sheet for Healthcare Providers: seriousbroker.it  This test is not yet  approved or cleared by the [place]  FDA and has been authorized for detection and/or diagnosis of SARS-CoV-2 by FDA under an Emergency Use Authorization (EUA). This EUA will remain in effect (meaning this test can be used) for the duration of the COVID-19 declaration under Section 564(b)(1) of the Act, 21 U.S.C. section 360bbb-3(b)(1), unless the authorization is terminated or revoked.     Resp Syncytial  Virus by PCR NEGATIVE NEGATIVE Final    Comment: (NOTE) Fact Sheet for Patients: bloggercourse.com  Fact Sheet for Healthcare Providers: seriousbroker.it  This test is not yet approved or cleared by the [place]  FDA and has been authorized for detection and/or diagnosis of SARS-CoV-2 by FDA under an Emergency Use Authorization (EUA). This EUA will remain in effect (meaning this test can be used) for the duration of the COVID-19 declaration under Section 564(b)(1) of the Act, 21 U.S.C. section 360bbb-3(b)(1), unless the authorization is terminated or revoked.  Performed at [facility], [address], [place], [place] [address]     Radiology Studies: DG Ankle 2 Views Right Result Date: 03/16/2024 EXAM: 2 VIEW(S) XRAY OF THE ANKLE 03/16/2024 06:08:00 PM CLINICAL HISTORY: Pain COMPARISON: None available. FINDINGS: BONES AND JOINTS: Plantar calcaneal spur. No acute fracture. No joint dislocation. SOFT TISSUES: Subcutaneous soft tissue edema. Vascular calcifications. IMPRESSION: 1. No acute osseous abnormality. 2. Subcutaneous soft tissue edema. Electronically signed by: Oneil Devonshire MD 03/16/2024 07:49 PM EST RP Workstation: [id number]   DG Ankle 2 Views Left Result Date: 03/16/2024 EXAM: 2 VIEW(S) XRAY OF THE LEFT ANKLE 03/16/2024 06:08:00 PM CLINICAL HISTORY: Left ankle pain COMPARISON: None available. FINDINGS: BONES AND JOINTS: No acute fracture. Small inferior calcaneal spur. Mild tibiotalar joint  osteoarthritis. No joint dislocation. SOFT TISSUES: Diffuse subcutaneous edema. Diffuse vascular calcifications. IMPRESSION: 1. No acute osseous abnormality. 2. Mild tibiotalar joint osteoarthritis. 3. Small inferior calcaneal spur. Electronically signed by: Oneil Devonshire MD 03/16/2024 07:48 PM EST RP Workstation: [id number]   Scheduled Meds:  amLODipine   10 mg Oral Daily   losartan  100 mg Oral Daily   And   hydrochlorothiazide   25 mg Oral Daily   insulin  aspart  0-15 Units Subcutaneous TID WC   rosuvastatin  10 mg Oral Daily   warfarin  5 mg Oral q1600   Warfarin - Pharmacist Dosing Inpatient   Does not apply q1600   Continuous Infusions:  ceFEPime (MAXIPIME) IV 2 g (03/17/24 0500)   vancomycin 1,500 mg (03/16/24 1544)     LOS: 2 days    Time spent: 35 mins    Darcel Dawley, MD [facility]   If 7PM-7AM, please contact night-coverage

## 2024-03-17 NOTE — Progress Notes (Signed)
 PHARMACY - ANTICOAGULATION CONSULT NOTE  Pharmacy Consult for warfarin Indication: Hx of VTE 2019  Allergies  Allergen Reactions   Vitamin D     Other Reaction(s): Rash on face   Penicillins Itching and Rash   Saxagliptin Rash    Patient Measurements: Height: 5' 4 (162.6 cm) Weight: (!) 160 kg (352 lb 11.8 oz) (Wt from 01/15/2024) IBW/kg (Calculated) : 54.7 HEPARIN  DW (KG): 95.9  Vital Signs: Temp: 98 F (36.7 C) (11/10 0713) Temp Source: Oral (11/10 0713) BP: 136/60 (11/10 0713) Pulse Rate: 81 (11/10 0713)  Labs: Recent Labs    03/14/24 1252 03/15/24 1110 03/16/24 0510 03/17/24 0634  HGB 9.8* 10.1*  --  9.7*  HCT 31.9* 31.9*  --  30.5*  PLT 367 345  --  460*  LABPROT 23.6*  --  28.0* 26.9*  INR 2.0*  --  2.5* 2.4*  CREATININE 1.28* 1.12*  --   --     Estimated Creatinine Clearance: 77.5 mL/min (A) (by C-G formula based on SCr of 1.12 mg/dL (H)).   Medical History: Past Medical History:  Diagnosis Date   Diabetes mellitus without complication (HCC)    Fat necrosis 05/28/2018   Hidradenitis suppurativa 05/28/2018   Hypertension    Morbid obesity (HCC)    Right second toe ulcer (HCC) 05/28/2018    Assessment: Pharmacy consulted to dose warfarin during inpatient admission. Patient presented to the ED with a chief complaint of right ankle pain and admitted for sepsis with unknown etiology. Upon chart review, patient is on warfarin 5 mg daily PTA for history of VTE in 2019. INR upon admission was therapeutic at 2.   Today's INR remains therapeutic at 2.4. CBC 9.7 (Hgb 10s, platelet wnl) and no documented signs of bleeding. Will continue usual home dose of warfarin 5 mg daily for now given INR within goal range.   Goal of Therapy:  INR 2-3 Monitor platelets by anticoagulation protocol: Yes   Plan:  - Continue warfarin 5 mg daily  - Monitor daily INR and signs/symptoms of bleeding  Donny Alert, PharmD, [facility] Clinical Pharmacist Please see AMION for all  Pharmacists' Contact Phone Numbers 03/17/2024, 9:30 AM

## 2024-03-18 DIAGNOSIS — A419 Sepsis, unspecified organism: Secondary | ICD-10-CM | POA: Diagnosis not present

## 2024-03-18 LAB — GLUCOSE, CAPILLARY
Glucose-Capillary: 136 mg/dL — ABNORMAL HIGH (ref 70–99)
Glucose-Capillary: 200 mg/dL — ABNORMAL HIGH (ref 70–99)
Glucose-Capillary: 153 mg/dL — ABNORMAL HIGH (ref 70–99)
Glucose-Capillary: 219 mg/dL — ABNORMAL HIGH (ref 70–99)

## 2024-03-18 LAB — CBC
HCT: 28.3 % — ABNORMAL LOW (ref 36.0–46.0)
Hemoglobin: 9 g/dL — ABNORMAL LOW (ref 12.0–15.0)
MCH: 26.9 pg (ref 26.0–34.0)
MCHC: 31.8 g/dL (ref 30.0–36.0)
MCV: 84.7 fL (ref 80.0–100.0)
Platelets: 447 K/uL — ABNORMAL HIGH (ref 150–400)
RBC: 3.34 MIL/uL — ABNORMAL LOW (ref 3.87–5.11)
RDW: 15.3 % (ref 11.5–15.5)
WBC: 15.5 K/uL — ABNORMAL HIGH (ref 4.0–10.5)
nRBC: 0 % (ref 0.0–0.2)

## 2024-03-18 LAB — MAGNESIUM: Magnesium: 1.5 mg/dL — ABNORMAL LOW (ref 1.7–2.4)

## 2024-03-18 LAB — PHOSPHORUS: Phosphorus: 2.4 mg/dL — ABNORMAL LOW (ref 2.5–4.6)

## 2024-03-18 LAB — PROTIME-INR
INR: 2.8 — ABNORMAL HIGH (ref 0.8–1.2)
Prothrombin Time: 30.5 s — ABNORMAL HIGH (ref 11.4–15.2)

## 2024-03-18 MED ORDER — WARFARIN SODIUM 4 MG PO TABS
4.0000 mg | ORAL_TABLET | Freq: Once | ORAL | Status: AC
  Administered 2024-03-18: 4 mg via ORAL
  Filled 2024-03-18: qty 1

## 2024-03-18 MED ORDER — MAGNESIUM SULFATE 2 GM/50ML IV SOLN
2.0000 g | Freq: Once | INTRAVENOUS | Status: AC
  Administered 2024-03-18: 2 g via INTRAVENOUS
  Filled 2024-03-18: qty 50

## 2024-03-18 MED ORDER — SODIUM PHOSPHATES 45 MMOLE/15ML IV SOLN
30.0000 mmol | Freq: Once | INTRAVENOUS | Status: AC
  Administered 2024-03-18: 30 mmol via INTRAVENOUS
  Filled 2024-03-18: qty 10

## 2024-03-18 NOTE — Telephone Encounter (Signed)
 Patient was admitted to the hospital and is still there.

## 2024-03-18 NOTE — Evaluation (Signed)
 Physical Therapy Evaluation Patient Details Name: Natasha Rollins MRN: [id number] DOB: [birth year] Today's Date: 03/18/2024  History of Present Illness  Pt is 63 year old presented to [facility] on  03/14/24 for R ankle pain and sepsis/UTI. PMH - PE, DM, morbid obesity, falls.  Clinical Impression  PTA, pt modI with all ADLs and typically used w/c and occasionally her RW for mobilization. She has a lift in her garage to allow her to get into her home, and she has been going to OP PT aquatics to improve functional mobility and strength. Currently, pt is supervision-minA for bed mobility, with most assist required for LE elevation onto bed during sit>supine. Pt CGA with step pivot into w/c, but demonstrated overall flexed trunk and difficulty shifting weight to R LE. Able to ambulate total of 30' with one seated rest break and close w/c follow throughout. Pt leaned elbows on RW handles bilaterally, as she states this is what she does at home and is most comfortable with. No LOB or staggering noted throughout. Pt states she feels weaker than her normal, but that her gait is close to baseline. Recommend continuation of OP PT for functional mobility and gait training and improving endurance with activity. Acute PT to follow.      If plan is discharge home, recommend the following: A little help with walking and/or transfers   Can travel by private vehicle        Equipment Recommendations None recommended by PT  Recommendations for Other Services       Functional Status Assessment Patient has had a recent decline in their functional status and demonstrates the ability to make significant improvements in function in a reasonable and predictable amount of time.     Precautions / Restrictions Precautions Precautions: Fall Recall of Precautions/Restrictions: Intact Restrictions Weight Bearing Restrictions Per Provider Order: No      Mobility  Bed Mobility Overal bed mobility: Needs  Assistance Bed Mobility: Supine to Sit, Sit to Supine     Supine to sit: Supervision, HOB elevated Sit to supine: Min assist   General bed mobility comments: MinA for sit>supine for elevating LE onto bed and assist for scooting up in bed. Pt able to boost self up in bed using handrail at head of bed.    Transfers Overall transfer level: Needs assistance Equipment used: Rolling walker (2 wheels) Transfers: Sit to/from Stand, Bed to chair/wheelchair/BSC Sit to Stand: Supervision, From elevated surface   Step pivot transfers: Supervision       General transfer comment: Pt described feeling like R ankle was going to give out during step pivot transfer. No LOB noted throughout. Also stated that she feels weaker than she normally does.    Ambulation/Gait Ambulation/Gait assistance: Contact guard assist, +2 safety/equipment Gait Distance (Feet): 30 Feet (1 bout 10', 2nd bout 20') Assistive device: Rolling walker (2 wheels) Gait Pattern/deviations: Step-through pattern, Decreased step length - right, Decreased step length - left, Trunk flexed Gait velocity: Dec Gait velocity interpretation: <1.31 ft/sec, indicative of household ambulator   General Gait Details: Pt leaning elbows on RW bilaterally, which she states she does at home. Pt reported slight inc pain in R ankle during, but appeared motivated to continue walking in room.  Stairs            Wheelchair Mobility     Tilt Bed    Modified Rankin (Stroke Patients Only)       Balance Overall balance assessment: Needs assistance Sitting-balance support: Feet supported Sitting balance-Leahy  Scale: Good     Standing balance support: Reliant on assistive device for balance, During functional activity, Bilateral upper extremity supported Standing balance-Leahy Scale: Poor                               Pertinent Vitals/Pain Pain Assessment Pain Assessment: Faces Faces Pain Scale: Hurts a little bit Pain  Location: R ankle Pain Descriptors / Indicators: Sore Pain Intervention(s): Limited activity within patient's tolerance, Monitored during session, Patient requesting pain meds-RN notified    Home Living Family/patient expects to be discharged to:: Private residence Living Arrangements: Spouse/significant other Available Help at Discharge: Family;Available PRN/intermittently Type of Home: House Home Access: Other (comment) (Lift in garage to main level of house)     Alternate Level Stairs-Number of Steps: Stays on the main level with 1/2 bath and bed in the Den - looking to remodel Den into bedroom with shower. Home Layout: Two level Home Equipment: Agricultural Consultant (2 wheels);Wheelchair - manual;BSC/3in1;Adaptive equipment      Prior Function Prior Level of Function : Independent/Modified Independent;Working/employed;Driving             Mobility Comments: pt goes to outpatient PT; aquatic PT ADLs Comments: modI     Extremity/Trunk Assessment   Upper Extremity Assessment Upper Extremity Assessment: Defer to OT evaluation;Overall WFL for tasks assessed    Lower Extremity Assessment Lower Extremity Assessment: Generalized weakness    Cervical / Trunk Assessment Cervical / Trunk Assessment: Normal  Communication   Communication Communication: No apparent difficulties    Cognition Arousal: Alert Behavior During Therapy: WFL for tasks assessed/performed   PT - Cognitive impairments: No apparent impairments                         Following commands: Intact       Cueing Cueing Techniques: Verbal cues     General Comments General comments (skin integrity, edema, etc.): Pt appears motivated to continue improving functional mobility and states she feels weaker than her normal.    Exercises     Assessment/Plan    PT Assessment Patient needs continued PT services  PT Problem List Decreased strength;Decreased range of motion;Decreased activity  tolerance;Decreased balance;Decreased mobility;Decreased knowledge of use of DME;Decreased safety awareness       PT Treatment Interventions DME instruction;Gait training;Stair training;Functional mobility training;Therapeutic exercise    PT Goals (Current goals can be found in the Care Plan section)  Acute Rehab PT Goals Patient Stated Goal: to go home PT Goal Formulation: With patient Time For Goal Achievement: 04/01/24 Potential to Achieve Goals: Good    Frequency Min 3X/week     Co-evaluation               AM-PAC PT 6 Clicks Mobility  Outcome Measure Help needed turning from your back to your side while in a flat bed without using bedrails?: None Help needed moving from lying on your back to sitting on the side of a flat bed without using bedrails?: A Little Help needed moving to and from a bed to a chair (including a wheelchair)?: A Little Help needed standing up from a chair using your arms (e.g., wheelchair or bedside chair)?: A Little Help needed to walk in hospital room?: A Little Help needed climbing 3-5 steps with a railing? : Total 6 Click Score: 17    End of Session Equipment Utilized During Treatment: Gait belt Activity Tolerance: Patient tolerated  treatment well Patient left: in bed;with bed alarm set;with call bell/phone within reach Nurse Communication: Mobility status PT Visit Diagnosis: Unsteadiness on feet (R26.81);Other abnormalities of gait and mobility (R26.89);Difficulty in walking, not elsewhere classified (R26.2)    Time: 8559-8472 PT Time Calculation (min) (ACUTE ONLY): 47 min   Charges:   PT Evaluation $PT Eval Low Complexity: 1 Low PT Treatments $Therapeutic Activity: 8-22 mins PT General Charges $$ ACUTE PT VISIT: 1 Visit         Gertie Broerman, SPT   Lorna Strother 03/18/2024, 5:26 PM

## 2024-03-18 NOTE — Progress Notes (Signed)
 PHARMACY - ANTICOAGULATION CONSULT NOTE  Pharmacy Consult for warfarin Indication: Hx of VTE 2019  Allergies  Allergen Reactions   Vitamin D     Other Reaction(s): Rash on face   Penicillins Itching and Rash   Saxagliptin Rash    Patient Measurements: Height: 5' 4 (162.6 cm) Weight: (!) 160 kg (352 lb 11.8 oz) (Wt from 01/15/2024) IBW/kg (Calculated) : 54.7 HEPARIN  DW (KG): 95.9  Vital Signs: Temp: 98.1 F (36.7 C) (11/11 0500) Temp Source: Oral (11/11 0500) BP: 116/54 (11/11 0500) Pulse Rate: 81 (11/11 0500)  Labs: Recent Labs    03/15/24 1110 03/16/24 0510 03/17/24 0634 03/18/24 0401  HGB 10.1*  --  9.7* 9.0*  HCT 31.9*  --  30.5* 28.3*  PLT 345  --  460* 447*  LABPROT  --  28.0* 26.9* 30.5*  INR  --  2.5* 2.4* 2.8*  CREATININE 1.12*  --  1.10*  --     Estimated Creatinine Clearance: 79 mL/min (A) (by C-G formula based on SCr of 1.1 mg/dL (H)).   Medical History: Past Medical History:  Diagnosis Date   Diabetes mellitus without complication (HCC)    Fat necrosis 05/28/2018   Hidradenitis suppurativa 05/28/2018   Hypertension    Morbid obesity (HCC)    Right second toe ulcer (HCC) 05/28/2018    Assessment: Pharmacy consulted to dose warfarin during inpatient admission. Patient presented to the ED with a chief complaint of right ankle pain and admitted for sepsis with unknown etiology. Upon chart review, patient is on warfarin 5 mg daily PTA for history of VTE in 2019. INR upon admission was therapeutic at 2.   11/11 AM update: INR up from 2.4 >> 2.8 this morning  Goal of Therapy:  INR 2-3 Monitor platelets by anticoagulation protocol: Yes   Plan:  - warfarin 4 mg po x1 dose today - Monitor daily INR and signs/symptoms of bleeding  Amiria Orrison BS, PharmD, BCPS Clinical Pharmacist 03/18/2024 6:50 AM  Contact: [phone] after 3 PM

## 2024-03-18 NOTE — Progress Notes (Signed)
 PROGRESS NOTE    Natasha Rollins  FMW:[id number] DOB: 10-20-1960 DOA: 03/14/2024 PCP: Verena Mems, MD    Brief Narrative: This 63 years old female with PMH significant for Morbid obesity, Hypertension, type 2 diabetes presented in the ED with right ankle pain which was so severe and she was unable to walk.  She describes symptoms as sudden onset and has been getting worse.  She presented in the ED 1 day prior and tests indicated high white cell count suggesting an infection.  Patient was discharged home but she presented again with worsening symptoms.  Patient does have history of arthritis and has been receiving injections in the back,  knees and ankles.  In the ED She was febrile, hypotensive and tachycardic,  labs consistent with leukocytosis,  UA consistent with bacteriuria.  Patient was admitted for sepsis with unknown etiology ,  started on empiric antibiotics (Vancomycin, Cefepime and Flagyl  ).   Assessment & Plan:   Principal Problem:   Sepsis (HCC)   Sepsis of unknown etiology: Presumably urinary in origin. Patient has developed R ankle pain likely related to known OA and not OM. Continue IV vancomycin / Cefepime for now. Continue NS at 100cc/h for now. ESR 110, CRP 27.7 both elevated Blood cultures > No growth to date. De-escalate antibiotics as able to.  R ankle pain: Follow-up outpatient podiatry for steroid injection as planned. Xray > No acute osseous abnormality.  Subcutaneous soft tissue edema. Continue pain control as needed. PT/OT evaluation    HTN: Continue amlodipine , hydrochlorothiazide , and losartan.   HLD: Continue  Crestor 10mg  daily.   H/o VTE: Continue warfarin per pharmacy protocol  Morbid obesity: Diet and exercise discussed in detail.  DVT prophylaxis: Warfarin Code Status:Full Code Family Communication: No family at bed side. Disposition Plan:    Status is: Inpatient Remains inpatient appropriate because: Patient admitted for  sepsis secondary to unknown cause.  Blood cultures so far negative.   PT/OT evaluation  Consultants:  None  Procedures: None  Antimicrobials:  Anti-infectives (From admission, onward)    Start     Dose/Rate Route Frequency Ordered Stop   03/15/24 1600  vancomycin (VANCOREADY) IVPB 1500 mg/300 mL  Status:  Discontinued        1,500 mg 150 mL/hr over 120 Minutes Intravenous Every 24 hours 03/15/24 0632 03/18/24 0652   03/15/24 0630  ceFEPIme (MAXIPIME) 2 g in sodium chloride  0.9 % 100 mL IVPB        2 g 200 mL/hr over 30 Minutes Intravenous Every 8 hours 03/15/24 0616     03/14/24 1430  vancomycin (VANCOCIN) IVPB 1000 mg/200 mL premix       Placed in And Linked Group   1,000 mg 200 mL/hr over 60 Minutes Intravenous  Once 03/14/24 1307 03/14/24 1811   03/14/24 1315  ceFEPIme (MAXIPIME) 2 g in sodium chloride  0.9 % 100 mL IVPB        2 g 200 mL/hr over 30 Minutes Intravenous  Once 03/14/24 1307 03/14/24 1411   03/14/24 1315  metroNIDAZOLE  (FLAGYL ) IVPB 500 mg        500 mg 100 mL/hr over 60 Minutes Intravenous  Once 03/14/24 1307 03/14/24 1438   03/14/24 1315  vancomycin (VANCOCIN) IVPB 1000 mg/200 mL premix       Placed in And Linked Group   1,000 mg 200 mL/hr over 60 Minutes Intravenous  Once 03/14/24 1307 03/14/24 1707      Subjective: Patient was seen and examined at bedside.  Overnight  events noted. Patient reports feeling better, but complains of having severe pain in both knees and ankles.  Objective: Vitals:   03/17/24 1431 03/17/24 2054 03/18/24 0500 03/18/24 0811  BP: 126/60 (!) 147/54 (!) 116/54 129/65  Pulse: 92 89 81 79  Resp: 16 18 16 16   Temp: 98.4 F (36.9 C) 98 F (36.7 C) 98.1 F (36.7 C) 98.5 F (36.9 C)  TempSrc:  Oral Oral   SpO2: 100% 99% 95% 97%  Weight:      Height:        Intake/Output Summary (Last 24 hours) at 03/18/2024 1408 Last data filed at 03/18/2024 9372 Gross per 24 hour  Intake 480 ml  Output 1100 ml  Net -620 ml    Filed Weights   03/15/24 0609  Weight: (!) 160 kg    Examination:  General exam: Appears calm and comfortable, not in any acute distress. Respiratory system: CTA Bilaterally. Respiratory effort normal.  RR 14 Cardiovascular system: S1 & S2 heard, RRR. No JVD, murmurs, rubs, gallops or clicks. Gastrointestinal system: Abdomen is nondistended, soft and nontender. Normal bowel sounds heard. Central nervous system: Alert and oriented x 3. No focal neurological deficits. Extremities: Tenderness is noted in the both ankles. Skin: No rashes, lesions or ulcers Psychiatry: Judgement and insight appear normal. Mood & affect appropriate.     Data Reviewed: I have personally reviewed following labs and imaging studies  CBC: Recent Labs  Lab 03/14/24 1252 03/15/24 1110 03/17/24 0634 03/18/24 0401  WBC 20.7* 20.2* 14.6* 15.5*  NEUTROABS 17.7*  --   --   --   HGB 9.8* 10.1* 9.7* 9.0*  HCT 31.9* 31.9* 30.5* 28.3*  MCV 86.7 86.2 84.3 84.7  PLT 367 345 460* 447*   Basic Metabolic Panel: Recent Labs  Lab 03/14/24 1252 03/15/24 1110 03/17/24 0634 03/18/24 0401  NA 140  --  137  --   K 3.4*  --  3.7  --   CL 98  --  100  --   CO2 29  --  27  --   GLUCOSE 160*  --  142*  --   BUN 22  --  22  --   CREATININE 1.28* 1.12* 1.10*  --   CALCIUM 9.5  --  8.6*  --   MG 1.5*  --  1.6* 1.5*  PHOS  --   --  2.4* 2.4*   GFR: Estimated Creatinine Clearance: 79 mL/min (A) (by C-G formula based on SCr of 1.1 mg/dL (H)). Liver Function Tests: Recent Labs  Lab 03/14/24 1252  AST 31  ALT 16  ALKPHOS 64  BILITOT 1.2  PROT 7.3  ALBUMIN 3.6   No results for input(s): LIPASE, AMYLASE in the last 168 hours. No results for input(s): AMMONIA in the last 168 hours. Coagulation Profile: Recent Labs  Lab 03/14/24 1252 03/16/24 0510 03/17/24 0634 03/18/24 0401  INR 2.0* 2.5* 2.4* 2.8*   Cardiac Enzymes: No results for input(s): CKTOTAL, CKMB, CKMBINDEX, TROPONINI in the  last 168 hours. BNP (last 3 results) No results for input(s): PROBNP in the last 8760 hours. HbA1C: No results for input(s): HGBA1C in the last 72 hours.  CBG: Recent Labs  Lab 03/17/24 1114 03/17/24 1528 03/17/24 2024 03/18/24 0616 03/18/24 1143  GLUCAP 148* 184* 260* 136* 200*   Lipid Profile: No results for input(s): CHOL, HDL, LDLCALC, TRIG, CHOLHDL, LDLDIRECT in the last 72 hours. Thyroid  Function Tests: No results for input(s): TSH, T4TOTAL, FREET4, T3FREE, THYROIDAB in the last  72 hours. Anemia Panel: No results for input(s): VITAMINB12, FOLATE, FERRITIN, TIBC, IRON, RETICCTPCT in the last 72 hours. Sepsis Labs: Recent Labs  Lab 03/14/24 1258  LATICACIDVEN 1.3    Recent Results (from the past 240 hours)  Blood Culture (routine x 2)     Status: None (Preliminary result)   Collection Time: 03/14/24 12:14 PM   Specimen: BLOOD  Result Value Ref Range Status   Specimen Description   Final    BLOOD LEFT ANTECUBITAL Performed at [facility], [address] [place], [place] [address]    Special Requests   Final    BOTTLES DRAWN AEROBIC AND ANAEROBIC Blood Culture adequate volume   Culture   Final    NO GROWTH 4 DAYS Performed at [facility], [address], [place], [place] [address]    Report Status PENDING  Incomplete  Blood Culture (routine x 2)     Status: None (Preliminary result)   Collection Time: 03/14/24 12:19 PM   Specimen: BLOOD RIGHT FOREARM  Result Value Ref Range Status   Specimen Description BLOOD RIGHT FOREARM  Final   Special Requests   Final    BOTTLES DRAWN AEROBIC AND ANAEROBIC Blood Culture adequate volume   Culture   Final    NO GROWTH 4 DAYS Performed at [facility], [address], [place], [place] [address]    Report Status PENDING  Incomplete  Resp panel by RT-PCR (RSV, Flu A&B, Covid) Anterior Nasal Swab     Status: None   Collection Time: 03/14/24 12:52 PM    Specimen: Anterior Nasal Swab  Result Value Ref Range Status   SARS Coronavirus 2 by RT PCR NEGATIVE NEGATIVE Final    Comment: (NOTE) SARS-CoV-2 target nucleic acids are NOT DETECTED.  The SARS-CoV-2 RNA is generally detectable in upper respiratory specimens during the acute phase of infection. The lowest concentration of SARS-CoV-2 viral copies this assay can detect is 138 copies/mL. A negative result does not preclude SARS-Cov-2 infection and should not be used as the sole basis for treatment or other patient management decisions. A negative result may occur with  improper specimen collection/handling, submission of specimen other than nasopharyngeal swab, presence of viral mutation(s) within the areas targeted by this assay, and inadequate number of viral copies(<138 copies/mL). A negative result must be combined with clinical observations, patient history, and epidemiological information. The expected result is Negative.  Fact Sheet for Patients:  bloggercourse.com  Fact Sheet for Healthcare Providers:  seriousbroker.it  This test is no t yet approved or cleared by the [place]  FDA and  has been authorized for detection and/or diagnosis of SARS-CoV-2 by FDA under an Emergency Use Authorization (EUA). This EUA will remain  in effect (meaning this test can be used) for the duration of the COVID-19 declaration under Section 564(b)(1) of the Act, 21 U.S.C.section 360bbb-3(b)(1), unless the authorization is terminated  or revoked sooner.       Influenza A by PCR NEGATIVE NEGATIVE Final   Influenza B by PCR NEGATIVE NEGATIVE Final    Comment: (NOTE) The Xpert Xpress SARS-CoV-2/FLU/RSV plus assay is intended as an aid in the diagnosis of influenza from Nasopharyngeal swab specimens and should not be used as a sole basis for treatment. Nasal washings and aspirates are unacceptable for Xpert Xpress  SARS-CoV-2/FLU/RSV testing.  Fact Sheet for Patients: bloggercourse.com  Fact Sheet for Healthcare Providers: seriousbroker.it  This test is not yet approved or cleared by the [place]  FDA and  has been authorized for detection and/or diagnosis of SARS-CoV-2 by FDA under an Emergency Use Authorization (EUA). This EUA will remain in effect (meaning this test can be used) for the duration of the COVID-19 declaration under Section 564(b)(1) of the Act, 21 U.S.C. section 360bbb-3(b)(1), unless the authorization is terminated or revoked.     Resp Syncytial Virus by PCR NEGATIVE NEGATIVE Final    Comment: (NOTE) Fact Sheet for Patients: bloggercourse.com  Fact Sheet for Healthcare Providers: seriousbroker.it  This test is not yet approved or cleared by the [place]  FDA and has been authorized for detection and/or diagnosis of SARS-CoV-2 by FDA under an Emergency Use Authorization (EUA). This EUA will remain in effect (meaning this test can be used) for the duration of the COVID-19 declaration under Section 564(b)(1) of the Act, 21 U.S.C. section 360bbb-3(b)(1), unless the authorization is terminated or revoked.  Performed at [facility], [address] [place], [place] [address]   MRSA Next Gen by PCR, Nasal     Status: None   Collection Time: 03/17/24  6:06 PM   Specimen: Nasal Mucosa; Nasal Swab  Result Value Ref Range Status   MRSA by PCR Next Gen NOT DETECTED NOT DETECTED Final    Comment: (NOTE) The GeneXpert MRSA Assay (FDA approved for NASAL specimens only), is one component of a comprehensive MRSA colonization surveillance program. It is not intended to diagnose MRSA infection nor to guide or monitor treatment for MRSA infections. Test performance is not FDA approved in patients less than 72 years old. Performed at [facility],  [address], [place], [place] [address]     Radiology Studies: DG Ankle 2 Views Right Result Date: 03/16/2024 EXAM: 2 VIEW(S) XRAY OF THE ANKLE 03/16/2024 06:08:00 PM CLINICAL HISTORY: Pain COMPARISON: None available. FINDINGS: BONES AND JOINTS: Plantar calcaneal spur. No acute fracture. No joint dislocation. SOFT TISSUES: Subcutaneous soft tissue edema. Vascular calcifications. IMPRESSION: 1. No acute osseous abnormality. 2. Subcutaneous soft tissue edema. Electronically signed by: Oneil Devonshire MD 03/16/2024 07:49 PM EST RP Workstation: [id number]   DG Ankle 2 Views Left Result Date: 03/16/2024 EXAM: 2 VIEW(S) XRAY OF THE LEFT ANKLE 03/16/2024 06:08:00 PM CLINICAL HISTORY: Left ankle pain COMPARISON: None available. FINDINGS: BONES AND JOINTS: No acute fracture. Small inferior calcaneal spur. Mild tibiotalar joint osteoarthritis. No joint dislocation. SOFT TISSUES: Diffuse subcutaneous edema. Diffuse vascular calcifications. IMPRESSION: 1. No acute osseous abnormality. 2. Mild tibiotalar joint osteoarthritis. 3. Small inferior calcaneal spur. Electronically signed by: Oneil Devonshire MD 03/16/2024 07:48 PM EST RP Workstation: [id number]   Scheduled Meds:  amLODipine   10 mg Oral Daily   losartan  100 mg Oral Daily   And   hydrochlorothiazide   25 mg Oral Daily   insulin  aspart  0-15 Units Subcutaneous TID WC   rosuvastatin  10 mg Oral Daily   warfarin  4 mg Oral ONCE-1600   Warfarin - Pharmacist Dosing Inpatient   Does not apply q1600   Continuous Infusions:  ceFEPime (MAXIPIME) IV 2 g (03/18/24 0627)   sodium phosphate  30 mmol in sodium chloride  0.9 % 250 mL infusion 30 mmol (03/18/24 1225)     LOS: 3 days    Time spent: 35 mins    Darcel Dawley, MD [facility]   If 7PM-7AM, please contact night-coverage

## 2024-03-18 NOTE — Evaluation (Addendum)
 Occupational Therapy Evaluation Patient Details Name: Natasha Rollins MRN: [id number] DOB: 08/26/1960 Today's Date: 03/18/2024   History of Present Illness   Pt is 63 year old presented to [facility] on  03/14/24 for R ankle pain and sepsis/UTI. PMH - PE, DM, morbid obesity, falls.     Clinical Impressions Patient admitted for the diagnosis above.  PTA she lives at home with her spouse.  Patient continues to drive and work as a clinical biochemist.  At baseline she could walk short household distances, and did use a RW to walk to her car in the morning, she then parks close to the school door, and staff bring her school wheelchair out to her, and she generally stays in the wheelchair during the day.  At home she walks 35' from her bed to the kitchen where her home wheelchair sits, and then uses the w/c throughout the day at home.  She performs sink baths at home, and generally needed no assist.  She admits to being sedentary at home.  Deficits are listed below.  Overall supervision for simple transfer, and up to Min A for ADL completion.  Patient has the needed DME at home, and participates with aquatic PT at baseline.  OT will follow in the acute setting, but no post acute OT is anticipated.  Of note, she leans through the RW with her elbows for transfers/mobility.       If plan is discharge home, recommend the following:   Assist for transportation;Assistance with cooking/housework;A little help with walking and/or transfers;A little help with bathing/dressing/bathroom     Functional Status Assessment   Patient has had a recent decline in their functional status and demonstrates the ability to make significant improvements in function in a reasonable and predictable amount of time.     Equipment Recommendations   None recommended by OT     Recommendations for Other Services         Precautions/Restrictions   Precautions Precautions: Fall Recall of Precautions/Restrictions:  Intact Restrictions Weight Bearing Restrictions Per Provider Order: No Other Position/Activity Restrictions: Body habitus.     Mobility Bed Mobility Overal bed mobility: Needs Assistance Bed Mobility: Supine to Sit     Supine to sit: Supervision, HOB elevated          Transfers Overall transfer level: Needs assistance Equipment used: Rolling walker (2 wheels) Transfers: Sit to/from Stand, Bed to chair/wheelchair/BSC Sit to Stand: Supervision, From elevated surface     Step pivot transfers: Supervision            Balance Overall balance assessment: Needs assistance Sitting-balance support: Feet supported Sitting balance-Leahy Scale: Good     Standing balance support: Reliant on assistive device for balance Standing balance-Leahy Scale: Poor                             ADL either performed or assessed with clinical judgement   ADL                       Lower Body Dressing: Minimal assistance;Sit to/from stand;Sitting/lateral leans   Toilet Transfer: Supervision/safety;Rolling walker (2 wheels)             General ADL Comments: patient in hunched position with use of RW.  RW raised to accomodate.     Vision Patient Visual Report: No change from baseline       Perception Perception: Not tested  Praxis Praxis: Not tested       Pertinent Vitals/Pain Pain Assessment Pain Assessment: Faces Faces Pain Scale: Hurts a little bit Pain Location: R ankle Pain Descriptors / Indicators: Sore Pain Intervention(s): Premedicated before session     Extremity/Trunk Assessment Upper Extremity Assessment Upper Extremity Assessment: Overall WFL for tasks assessed   Lower Extremity Assessment Lower Extremity Assessment: Defer to PT evaluation       Communication Communication Communication: No apparent difficulties   Cognition Arousal: Alert Behavior During Therapy: WFL for tasks assessed/performed Cognition: No apparent  impairments                               Following commands: Intact       Cueing  General Comments   Cueing Techniques: Verbal cues      Exercises     Shoulder Instructions      Home Living Family/patient expects to be discharged to:: Private residence Living Arrangements: Spouse/significant other Available Help at Discharge: Family;Available PRN/intermittently Type of Home: House Home Access: Other (comment) (lift in garage to main level of home for wheelchair.)     Home Layout: Two level Alternate Level Stairs-Number of Steps: Stays on the main level with 1/2 bath and bed in the Den - looking to remodel Den into bedroom with shower.   Bathroom Shower/Tub: Sponge bathes at baseline   Bathroom Toilet: Handicapped height Bathroom Accessibility: Yes   Home Equipment: Agricultural Consultant (2 wheels);Wheelchair - manual;BSC/3in1;Adaptive equipment Adaptive Equipment: Sock aid        Prior Functioning/Environment Prior Level of Function : Independent/Modified Independent;Working/employed;Driving             Mobility Comments: pt goes to outpatient PT ADLs Comments: mod I including donning compression socks    OT Problem List: Decreased activity tolerance;Impaired balance (sitting and/or standing);Decreased strength;Pain   OT Treatment/Interventions: Therapeutic activities;Self-care/ADL training;DME and/or AE instruction;Balance training      OT Goals(Current goals can be found in the care plan section)   Acute Rehab OT Goals Patient Stated Goal: Return home 11/12 OT Goal Formulation: With patient Time For Goal Achievement: 04/01/24 Potential to Achieve Goals: Good ADL Goals Pt Will Perform Lower Body Dressing: with modified independence;sit to/from stand;sitting/lateral leans Pt Will Transfer to Toilet: with modified independence;ambulating;regular height toilet   OT Frequency:  Min 2X/week    Co-evaluation              AM-PAC OT 6  Clicks Daily Activity     Outcome Measure Help from another person eating meals?: None Help from another person taking care of personal grooming?: None Help from another person toileting, which includes using toliet, bedpan, or urinal?: A Little Help from another person bathing (including washing, rinsing, drying)?: A Little Help from another person to put on and taking off regular upper body clothing?: None Help from another person to put on and taking off regular lower body clothing?: A Little 6 Click Score: 21   End of Session Equipment Utilized During Treatment: Rolling walker (2 wheels) Nurse Communication: Mobility status  Activity Tolerance: Patient tolerated treatment well Patient left: in chair;with call bell/phone within reach  OT Visit Diagnosis: Unsteadiness on feet (R26.81);Pain Pain - Right/Left: Right Pain - part of body: Ankle and joints of foot                Time: 1000-1024 OT Time Calculation (min): 24 min Charges:  OT General Charges $OT Visit:  1 Visit OT Evaluation $OT Eval Moderate Complexity: 1 Mod  03/18/2024  RP, OTR/L  Acute Rehabilitation Services  Office:  [phone]   Charlie JONETTA Halsted 03/18/2024, 10:39 AM

## 2024-03-19 DIAGNOSIS — M25571 Pain in right ankle and joints of right foot: Secondary | ICD-10-CM | POA: Diagnosis not present

## 2024-03-19 DIAGNOSIS — M25572 Pain in left ankle and joints of left foot: Secondary | ICD-10-CM

## 2024-03-19 LAB — CBC
HCT: 29.3 % — ABNORMAL LOW (ref 36.0–46.0)
Hemoglobin: 9.3 g/dL — ABNORMAL LOW (ref 12.0–15.0)
MCH: 26.8 pg (ref 26.0–34.0)
MCHC: 31.7 g/dL (ref 30.0–36.0)
MCV: 84.4 fL (ref 80.0–100.0)
Platelets: 531 K/uL — ABNORMAL HIGH (ref 150–400)
RBC: 3.47 MIL/uL — ABNORMAL LOW (ref 3.87–5.11)
RDW: 15.6 % — ABNORMAL HIGH (ref 11.5–15.5)
WBC: 16.3 K/uL — ABNORMAL HIGH (ref 4.0–10.5)
nRBC: 0 % (ref 0.0–0.2)

## 2024-03-19 LAB — BASIC METABOLIC PANEL WITH GFR
Anion gap: 13 (ref 5–15)
BUN: 21 mg/dL (ref 8–23)
CO2: 27 mmol/L (ref 22–32)
Calcium: 8.6 mg/dL — ABNORMAL LOW (ref 8.9–10.3)
Chloride: 101 mmol/L (ref 98–111)
Creatinine, Ser: 0.85 mg/dL (ref 0.44–1.00)
GFR, Estimated: >60 mL/min (ref >60)
Glucose, Bld: 138 mg/dL — ABNORMAL HIGH (ref 70–99)
Potassium: 3.8 mmol/L (ref 3.5–5.1)
Sodium: 141 mmol/L (ref 135–145)

## 2024-03-19 LAB — GLUCOSE, CAPILLARY
Glucose-Capillary: 136 mg/dL — ABNORMAL HIGH (ref 70–99)
Glucose-Capillary: 134 mg/dL — ABNORMAL HIGH (ref 70–99)
Glucose-Capillary: 200 mg/dL — ABNORMAL HIGH (ref 70–99)
Glucose-Capillary: 209 mg/dL — ABNORMAL HIGH (ref 70–99)
Glucose-Capillary: 291 mg/dL — ABNORMAL HIGH (ref 70–99)

## 2024-03-19 LAB — CULTURE, BLOOD (ROUTINE X 2)
Culture: NO GROWTH
Culture: NO GROWTH
Special Requests: ADEQUATE
Special Requests: ADEQUATE

## 2024-03-19 LAB — PROTIME-INR
INR: 2.5 — ABNORMAL HIGH (ref 0.8–1.2)
Prothrombin Time: 28.1 s — ABNORMAL HIGH (ref 11.4–15.2)

## 2024-03-19 LAB — URIC ACID: Uric Acid, Serum: 7.1 mg/dL (ref 2.5–7.1)

## 2024-03-19 LAB — PHOSPHORUS: Phosphorus: 3.5 mg/dL (ref 2.5–4.6)

## 2024-03-19 LAB — MAGNESIUM: Magnesium: 1.7 mg/dL (ref 1.7–2.4)

## 2024-03-19 MED ORDER — PREDNISONE 20 MG PO TABS
40.0000 mg | ORAL_TABLET | Freq: Every day | ORAL | Status: DC
  Administered 2024-03-19 – 2024-03-21 (×3): 40 mg via ORAL
  Filled 2024-03-19 (×3): qty 2

## 2024-03-19 MED ORDER — IBUPROFEN 200 MG PO TABS
600.0000 mg | ORAL_TABLET | Freq: Three times a day (TID) | ORAL | Status: AC
  Administered 2024-03-19 – 2024-03-20 (×3): 600 mg via ORAL
  Filled 2024-03-19 (×3): qty 3

## 2024-03-19 MED ORDER — WARFARIN SODIUM 5 MG PO TABS
5.0000 mg | ORAL_TABLET | Freq: Once | ORAL | Status: AC
  Administered 2024-03-19: 5 mg via ORAL
  Filled 2024-03-19: qty 1

## 2024-03-19 MED ORDER — ACETAMINOPHEN 325 MG PO TABS
650.0000 mg | ORAL_TABLET | Freq: Four times a day (QID) | ORAL | Status: DC | PRN
  Administered 2024-03-20: 650 mg via ORAL
  Filled 2024-03-19: qty 2

## 2024-03-19 NOTE — Progress Notes (Signed)
 This RN called to room due to a razor cut to face.  Bleeding noted from patient's chin.  Site cleansed with Normal Saline.  Petroleum gauze applied.  Covered with foam dressing.  This RN explained to patient that she should not use a razor going forward due to her taking Coumadin .  Patient advised that she did not realize that.  Explained to patient that she could use a environmental health practitioner for her face.  Verbalized understanding.

## 2024-03-19 NOTE — Telephone Encounter (Signed)
 Patient called again and would like to speak with someone regarding her ankle. She is still in the hospital. Her telephone number is [phone].

## 2024-03-19 NOTE — Progress Notes (Addendum)
 PROGRESS NOTE   Natasha Rollins  FMW:[id number]    DOB: 08-05-1960    DOA: 03/14/2024  PCP: Verena Mems, MD   I have briefly reviewed patients previous medical records in [facility].   Brief Hospital Course:  63 year old married female, ambulates with the help of a walker, medical history significant for Morbid obesity, HTN, DM2, prior bilateral ankle pain for which she follows with podiatry and has received bilateral ankle injections, last in February 2025, presented with complaints of 1 week history of progressively worsening bilateral ankle pain and swelling, right worse than the left to an extent that she was unable to weight-bear and thereby could not even go see her podiatrist for an appointment that she had scheduled.  She reportedly was seen in the ED, noted to have leukocytosis, was discharged home and returned with worsening symptoms.  In ED, low-grade fever of 100.7 F, had sustained tachycardia in the 120s-110s, transient soft blood pressures, WBC 20.7.  She was admitted for presumed sepsis of unknown etiology and started on empiric broad-spectrum IV antibiotics.    Blood cultures are negative, ankle x-rays without acute findings, suspect that she has more inflammatory related bilateral ankle pain rather than infectious etiology.  Discontinued all antibiotics.  Communicated with Dr. Alona her primary podiatrist, started p.o. prednisone 40 mg daily and plan on getting MRI of both ankles.   Assessment & Plan:   Bilateral ankle pain and swelling, right > left Suspecting acute inflammatory arthritis. History as noted above X-rays of bilateral ankle 11/9 without acute bony abnormalities.  Mild arthritic changes of left ankle. CRP 27.7, ESR 110.  WBC 20.7 which is trended down to 16.3.  Venous lactate negative. MRSA PCR screen negative.  Blood cultures x 2: 11/7, negative. Has been on IV cefazolin and vancomycin since 11/7, completed 4 days course and was stopped on 11/10.   Monitor off of antibiotics As noted above, suspect that her presentation was more inflammatory than infectious.  Communicated with Dr. Alona, podiatry.  Plan on getting bilateral ankle MRI with contrast.  Started prednisone 40 mg daily (we will see how she does before increasing dose if needed.  Do not want to really place her on weight-based prednisone which would be much higher).  Also scheduled Motrin  for 24 hours. PT and OT evaluated, recommend outpatient PT. Although she met SIRS criteria, do not feel that she had sepsis.  Sepsis ruled out. Dr. Alona also indicates that they do not have ankle privileges at this time for patient to be seen in the hospital but is happy to see her in the office. She had hyperuricemia with serum uric acid of 9.4 and February 2021.  Will recheck.  Essential hypertension Controlled on amlodipine , losartan and HCTZ  Dyslipidemia Continue rosuvastatin  History of VTE Continue warfarin per pharmacy protocol.  Anticoagulated.  INR 2.5.  Normocytic anemia ?  Anemia of chronic disease. Anemia stable. Leukocytosis may be reactive and is improving.  Follow CBC in AM.  Hypomagnesemia Hypophosphatemia Replaced.  Prediabetes A1c 6.1 on 11/8 and 6.4 on 5/21. Outpatient follow-up. Given that starting her on prednisone, will order CBGs and NovoLog  sensitive SSI.   Body mass index is 60.55 kg/m./Very morbid obesity Complicates care.  Outpatient follow-up.   DVT prophylaxis:   Anticoagulated on warfarin   Code Status: Full Code:  Family Communication: None at bedside Disposition:  Status is: Inpatient Remains inpatient appropriate because: Ongoing ankle pain, mostly right ankle pain and difficulty weightbearing.  Consultants:     Procedures:     Subjective:  Patient reports that she had excruciating pain in her right ankle more than the left on admission, was unable to weight-bear.  Since then has done much better.  However still reports pain  is 7-8/10 in the right ankle.  Did ambulate with PT, 25 feet +10 feet +10 foot with a rolling walker.  Able to move her ankles better and overall mobility has improved.  Rest of history as noted above.  Denies history of gout.  Objective:   Vitals:   03/18/24 1716 03/18/24 1946 03/19/24 0435 03/19/24 0720  BP: 133/62 105/73 (!) 118/56 125/62  Pulse: 94 97 81 73  Resp: 18 18 18 16   Temp: 97.8 F (36.6 C) 97.8 F (36.6 C) 97.7 F (36.5 C) 98.1 F (36.7 C)  TempSrc:  Oral Oral Oral  SpO2: 100% 100% 99% 99%  Weight:      Height:        General exam: Middle-age female, moderately built and morbidly obese sitting up comfortably in reclining chair without distress. Respiratory system: Clear to auscultation. Respiratory effort normal. Cardiovascular system: S1 & S2 heard, RRR. No JVD, murmurs, rubs, gallops or clicks. No pedal edema. Gastrointestinal system: Abdomen is nondistended, soft and nontender. No organomegaly or masses felt. Normal bowel sounds heard. Central nervous system: Alert and oriented. No focal neurological deficits. Extremities: Symmetric 5 x 5 power.  Bilateral ankle swelling, left more than the right with mild tenderness without erythema or any other acute findings.  Minimal painful range of movements. Skin: No rashes, lesions or ulcers Psychiatry: Judgement and insight appear normal. Mood & affect appropriate.     Data Reviewed:   I have personally reviewed following labs and imaging studies   CBC: Recent Labs  Lab 03/14/24 1252 03/15/24 1110 03/17/24 0634 03/18/24 0401 03/19/24 0439  WBC 20.7*   < > 14.6* 15.5* 16.3*  NEUTROABS 17.7*  --   --   --   --   HGB 9.8*   < > 9.7* 9.0* 9.3*  HCT 31.9*   < > 30.5* 28.3* 29.3*  MCV 86.7   < > 84.3 84.7 84.4  PLT 367   < > 460* 447* 531*   < > = values in this interval not displayed.    Basic Metabolic Panel: Recent Labs  Lab 03/14/24 1252 03/15/24 1110 03/17/24 0634 03/18/24 0401 03/19/24 0439  NA 140   --  137  --  141  K 3.4*  --  3.7  --  3.8  CL 98  --  100  --  101  CO2 29  --  27  --  27  GLUCOSE 160*  --  142*  --  138*  BUN 22  --  22  --  21  CREATININE 1.28* 1.12* 1.10*  --  0.85  CALCIUM 9.5  --  8.6*  --  8.6*  MG 1.5*  --  1.6* 1.5* 1.7  PHOS  --   --  2.4* 2.4* 3.5    Liver Function Tests: Recent Labs  Lab 03/14/24 1252  AST 31  ALT 16  ALKPHOS 64  BILITOT 1.2  PROT 7.3  ALBUMIN 3.6    CBG: Recent Labs  Lab 03/19/24 0529 03/19/24 0731 03/19/24 1116  GLUCAP 136* 134* 200*    Microbiology Studies:   Recent Results (from the past 240 hours)  Blood Culture (routine x 2)     Status: None  Collection Time: 03/14/24 12:14 PM   Specimen: BLOOD  Result Value Ref Range Status   Specimen Description   Final    BLOOD LEFT ANTECUBITAL Performed at [facility], [address], [place], [place] [address]    Special Requests   Final    BOTTLES DRAWN AEROBIC AND ANAEROBIC Blood Culture adequate volume   Culture   Final    NO GROWTH 5 DAYS Performed at [facility], [address], [place], [place] [address]    Report Status 03/19/2024 FINAL  Final  Blood Culture (routine x 2)     Status: None   Collection Time: 03/14/24 12:19 PM   Specimen: BLOOD RIGHT FOREARM  Result Value Ref Range Status   Specimen Description BLOOD RIGHT FOREARM  Final   Special Requests   Final    BOTTLES DRAWN AEROBIC AND ANAEROBIC Blood Culture adequate volume   Culture   Final    NO GROWTH 5 DAYS Performed at [facility], [address] [place], [place] [address]    Report Status 03/19/2024 FINAL  Final  Resp panel by RT-PCR (RSV, Flu A&B, Covid) Anterior Nasal Swab     Status: None   Collection Time: 03/14/24 12:52 PM   Specimen: Anterior Nasal Swab  Result Value Ref Range Status   SARS Coronavirus 2 by RT PCR NEGATIVE NEGATIVE Final    Comment: (NOTE) SARS-CoV-2 target nucleic acids are NOT DETECTED.  The SARS-CoV-2 RNA is generally  detectable in upper respiratory specimens during the acute phase of infection. The lowest concentration of SARS-CoV-2 viral copies this assay can detect is 138 copies/mL. A negative result does not preclude SARS-Cov-2 infection and should not be used as the sole basis for treatment or other patient management decisions. A negative result may occur with  improper specimen collection/handling, submission of specimen other than nasopharyngeal swab, presence of viral mutation(s) within the areas targeted by this assay, and inadequate number of viral copies(<138 copies/mL). A negative result must be combined with clinical observations, patient history, and epidemiological information. The expected result is Negative.  Fact Sheet for Patients:  bloggercourse.com  Fact Sheet for Healthcare Providers:  seriousbroker.it  This test is no t yet approved or cleared by the [place]  FDA and  has been authorized for detection and/or diagnosis of SARS-CoV-2 by FDA under an Emergency Use Authorization (EUA). This EUA will remain  in effect (meaning this test can be used) for the duration of the COVID-19 declaration under Section 564(b)(1) of the Act, 21 U.S.C.section 360bbb-3(b)(1), unless the authorization is terminated  or revoked sooner.       Influenza A by PCR NEGATIVE NEGATIVE Final   Influenza B by PCR NEGATIVE NEGATIVE Final    Comment: (NOTE) The Xpert Xpress SARS-CoV-2/FLU/RSV plus assay is intended as an aid in the diagnosis of influenza from Nasopharyngeal swab specimens and should not be used as a sole basis for treatment. Nasal washings and aspirates are unacceptable for Xpert Xpress SARS-CoV-2/FLU/RSV testing.  Fact Sheet for Patients: bloggercourse.com  Fact Sheet for Healthcare Providers: seriousbroker.it  This test is not yet approved or cleared by the [place]  FDA  and has been authorized for detection and/or diagnosis of SARS-CoV-2 by FDA under an Emergency Use Authorization (EUA). This EUA will remain in effect (meaning this test can be used) for the duration of the COVID-19 declaration under Section 564(b)(1) of the Act, 21 U.S.C. section 360bbb-3(b)(1), unless the authorization is terminated or revoked.  Resp Syncytial Virus by PCR NEGATIVE NEGATIVE Final    Comment: (NOTE) Fact Sheet for Patients: bloggercourse.com  Fact Sheet for Healthcare Providers: seriousbroker.it  This test is not yet approved or cleared by the [place]  FDA and has been authorized for detection and/or diagnosis of SARS-CoV-2 by FDA under an Emergency Use Authorization (EUA). This EUA will remain in effect (meaning this test can be used) for the duration of the COVID-19 declaration under Section 564(b)(1) of the Act, 21 U.S.C. section 360bbb-3(b)(1), unless the authorization is terminated or revoked.  Performed at [facility], [address] [place], [place] [address]   MRSA Next Gen by PCR, Nasal     Status: None   Collection Time: 03/17/24  6:06 PM   Specimen: Nasal Mucosa; Nasal Swab  Result Value Ref Range Status   MRSA by PCR Next Gen NOT DETECTED NOT DETECTED Final    Comment: (NOTE) The GeneXpert MRSA Assay (FDA approved for NASAL specimens only), is one component of a comprehensive MRSA colonization surveillance program. It is not intended to diagnose MRSA infection nor to guide or monitor treatment for MRSA infections. Test performance is not FDA approved in patients less than 72 years old. Performed at [facility], [address], [place], [place] [address]     Radiology Studies:  No results found.  Scheduled Meds:    amLODipine   10 mg Oral Daily   losartan  100 mg Oral Daily   And   hydrochlorothiazide   25 mg Oral Daily   insulin  aspart  0-15 Units  Subcutaneous TID WC   rosuvastatin  10 mg Oral Daily   warfarin  5 mg Oral ONCE-1600   Warfarin - Pharmacist Dosing Inpatient   Does not apply q1600    Continuous Infusions:     LOS: 4 days     Trenda Mar, MD,  FACP, [facility], [facility], [facility]   [facility] Advisor [place]      To contact the attending provider between 7A-7P or the covering provider during after hours 7P-7A, please log into the web site [email or web address] and access using universal  password for that web site. If you do not have the password, please call the hospital operator.  03/19/2024, 1:03 PM

## 2024-03-19 NOTE — Telephone Encounter (Signed)
Left detailed messages

## 2024-03-19 NOTE — Plan of Care (Signed)

## 2024-03-19 NOTE — Progress Notes (Signed)
 Report given to MRI technician about patient.

## 2024-03-19 NOTE — Plan of Care (Signed)

## 2024-03-19 NOTE — Progress Notes (Signed)
 PHARMACY - ANTICOAGULATION CONSULT NOTE  Pharmacy Consult for warfarin Indication: Hx of VTE 2019  Allergies  Allergen Reactions   Vitamin D     Other Reaction(s): Rash on face   Penicillins Itching and Rash   Saxagliptin Rash    Patient Measurements: Height: 5' 4 (162.6 cm) Weight: (!) 160 kg (352 lb 11.8 oz) (Wt from 01/15/2024) IBW/kg (Calculated) : 54.7 HEPARIN  DW (KG): 95.9  Vital Signs: Temp: 97.7 F (36.5 C) (11/12 0435) Temp Source: Oral (11/12 0435) BP: 118/56 (11/12 0435) Pulse Rate: 81 (11/12 0435)  Labs: Recent Labs    03/17/24 0634 03/18/24 0401 03/19/24 0439  HGB 9.7* 9.0* 9.3*  HCT 30.5* 28.3* 29.3*  PLT 460* 447* 531*  LABPROT 26.9* 30.5* 28.1*  INR 2.4* 2.8* 2.5*  CREATININE 1.10*  --  0.85    Estimated Creatinine Clearance: 102.2 mL/min (by C-G formula based on SCr of 0.85 mg/dL).   Medical History: Past Medical History:  Diagnosis Date   Diabetes mellitus without complication (HCC)    Fat necrosis 05/28/2018   Hidradenitis suppurativa 05/28/2018   Hypertension    Morbid obesity (HCC)    Right second toe ulcer (HCC) 05/28/2018    Assessment: Pharmacy consulted to dose warfarin during inpatient admission. Patient presented to the ED with a chief complaint of right ankle pain and admitted for sepsis with unknown etiology. Upon chart review, patient is on warfarin 5 mg daily PTA for history of VTE in 2019. INR upon admission was therapeutic at 2.   11/12 AM update: INR 2.5  Goal of Therapy:  INR 2-3 Monitor platelets by anticoagulation protocol: Yes   Plan:  - warfarin 5 mg po x1 dose today - Monitor daily INR and signs/symptoms of bleeding  Dreux Mcgroarty BS, PharmD, BCPS Clinical Pharmacist 03/19/2024 6:52 AM  Contact: [phone] after 3 PM

## 2024-03-19 NOTE — Progress Notes (Signed)
 Physical Therapy Treatment Patient Details Name: Natasha Rollins MRN: [id number] DOB: 06/07/1960 Today's Date: 03/19/2024   History of Present Illness Pt is 63 year old presented to [facility] on  03/14/24 for R ankle pain and sepsis/UTI. R ankle imaging negative for acute findings. PMH - PE, DM, morbid obesity, falls.    PT Comments  PT focus of session on progressing activity tolerance, repeated transfers from a variety of surfaces, and gait. Pt tolerating x3 short room distance bouts of gait, and x3 transfers into standing from a variety of support surfaces (Bed, BSC, and w/c). Pt endorsing increasing R ankle pain with continued mobility but no buckling or ankle instability noted. PT educated pt on correct use of RW, as pt tends to prop on elbows which is baseline. From a PT standpoint, pt is close to baseline and is appropriate to d/c home as long as husband can provide increased support and pt is compliant with walking short household distances and use of w/c for further distance. Pt denies further questions for PT.    If plan is discharge home, recommend the following: A little help with walking and/or transfers;A little help with bathing/dressing/bathroom   Can travel by private vehicle        Equipment Recommendations  None recommended by PT    Recommendations for Other Services       Precautions / Restrictions Precautions Precautions: Fall Recall of Precautions/Restrictions: Intact Restrictions Weight Bearing Restrictions Per Provider Order: No     Mobility  Bed Mobility Overal bed mobility: Needs Assistance Bed Mobility: Supine to Sit     Supine to sit: HOB elevated, Min assist     General bed mobility comments: assist for completion of LE translation to EOB, pt using bedrails with cues to pull trunk to upright.    Transfers Overall transfer level: Needs assistance Equipment used: Rolling walker (2 wheels) Transfers: Sit to/from Stand Sit to Stand: From elevated  surface, Supervision           General transfer comment: slow to rise, cues for correct hand placement initially progressing to pt self-cuing correct hand placement. STand x3, from EOB, BSC, and w/c to practice transfers from different heights/surfaces    Ambulation/Gait Ambulation/Gait assistance: Contact guard assist Gait Distance (Feet): 25 Feet (+10+10) Assistive device: Rolling walker (2 wheels) Gait Pattern/deviations: Step-through pattern, Decreased step length - right, Decreased step length - left, Trunk flexed, Antalgic Gait velocity: Decr     General Gait Details: for safety, cues for upright posture and proximity to RW as tolerated, pt tending to lean elbows on RW bilat which is baseline   Optometrist     Tilt Bed    Modified Rankin (Stroke Patients Only)       Balance Overall balance assessment: Needs assistance Sitting-balance support: Feet supported Sitting balance-Leahy Scale: Good     Standing balance support: Reliant on assistive device for balance, During functional activity, Bilateral upper extremity supported Standing balance-Leahy Scale: Poor                              Communication Communication Communication: No apparent difficulties  Cognition Arousal: Alert Behavior During Therapy: WFL for tasks assessed/performed   PT - Cognitive impairments: No apparent impairments  Following commands: Intact      Cueing Cueing Techniques: Verbal cues  Exercises      General Comments        Pertinent Vitals/Pain Pain Assessment Pain Assessment: Faces Faces Pain Scale: Hurts little more Pain Location: R ankle Pain Descriptors / Indicators: Sore Pain Intervention(s): Limited activity within patient's tolerance, Monitored during session, Repositioned    Home Living                          Prior Function            PT Goals (current goals can  now be found in the care plan section) Acute Rehab PT Goals Patient Stated Goal: to go home PT Goal Formulation: With patient Time For Goal Achievement: 04/01/24 Potential to Achieve Goals: Good Progress towards PT goals: Progressing toward goals    Frequency    Min 3X/week      PT Plan      Co-evaluation              AM-PAC PT 6 Clicks Mobility   Outcome Measure  Help needed turning from your back to your side while in a flat bed without using bedrails?: None Help needed moving from lying on your back to sitting on the side of a flat bed without using bedrails?: A Little Help needed moving to and from a bed to a chair (including a wheelchair)?: A Little Help needed standing up from a chair using your arms (e.g., wheelchair or bedside chair)?: A Little Help needed to walk in hospital room?: A Little Help needed climbing 3-5 steps with a railing? : A Lot 6 Click Score: 18    End of Session   Activity Tolerance: Patient tolerated treatment well Patient left: with call bell/phone within reach;in chair;Other (comment) (pt states she will press call button and wait for assist prior to mobilizing) Nurse Communication: Mobility status PT Visit Diagnosis: Unsteadiness on feet (R26.81);Other abnormalities of gait and mobility (R26.89);Difficulty in walking, not elsewhere classified (R26.2)     Time: 9058-8995; 1016-1032(1005-1015 not included in session time given BM)  PT Time Calculation (min) (ACUTE ONLY): 39 min  Charges:    $Gait Training: 8-22 mins $Therapeutic Activity: 23-37 mins PT General Charges $$ ACUTE PT VISIT: 1 Visit                     [name], PT DPT Acute Rehabilitation Services Secure Chat Preferred  Office [phone]    Nery Kalisz E Johna 03/19/2024, 10:41 AM

## 2024-03-20 ENCOUNTER — Ambulatory Visit: Admitting: Podiatry

## 2024-03-20 ENCOUNTER — Other Ambulatory Visit (HOSPITAL_COMMUNITY): Payer: Self-pay

## 2024-03-20 ENCOUNTER — Inpatient Hospital Stay (HOSPITAL_COMMUNITY)

## 2024-03-20 ENCOUNTER — Telehealth: Payer: Self-pay | Admitting: Podiatry

## 2024-03-20 DIAGNOSIS — M19072 Primary osteoarthritis, left ankle and foot: Secondary | ICD-10-CM

## 2024-03-20 DIAGNOSIS — M19071 Primary osteoarthritis, right ankle and foot: Secondary | ICD-10-CM | POA: Diagnosis not present

## 2024-03-20 LAB — CBC
HCT: 29.4 % — ABNORMAL LOW (ref 36.0–46.0)
Hemoglobin: 9.4 g/dL — ABNORMAL LOW (ref 12.0–15.0)
MCH: 27.1 pg (ref 26.0–34.0)
MCHC: 32 g/dL (ref 30.0–36.0)
MCV: 84.7 fL (ref 80.0–100.0)
Platelets: 552 K/uL — ABNORMAL HIGH (ref 150–400)
RBC: 3.47 MIL/uL — ABNORMAL LOW (ref 3.87–5.11)
RDW: 15.6 % — ABNORMAL HIGH (ref 11.5–15.5)
WBC: 18.5 K/uL — ABNORMAL HIGH (ref 4.0–10.5)
nRBC: 0 % (ref 0.0–0.2)

## 2024-03-20 LAB — C-REACTIVE PROTEIN: CRP: 1.4 mg/dL — ABNORMAL HIGH (ref <1.0)

## 2024-03-20 LAB — PROTIME-INR
INR: 2.4 — ABNORMAL HIGH (ref 0.8–1.2)
Prothrombin Time: 27.7 s — ABNORMAL HIGH (ref 11.4–15.2)

## 2024-03-20 LAB — GLUCOSE, CAPILLARY
Glucose-Capillary: 235 mg/dL — ABNORMAL HIGH (ref 70–99)
Glucose-Capillary: 249 mg/dL — ABNORMAL HIGH (ref 70–99)
Glucose-Capillary: 333 mg/dL — ABNORMAL HIGH (ref 70–99)
Glucose-Capillary: 395 mg/dL — ABNORMAL HIGH (ref 70–99)

## 2024-03-20 MED ORDER — INSULIN ASPART 100 UNIT/ML IJ SOLN
8.0000 [IU] | Freq: Once | INTRAMUSCULAR | Status: AC
  Administered 2024-03-20: 8 [IU] via SUBCUTANEOUS
  Filled 2024-03-20: qty 8

## 2024-03-20 MED ORDER — PREDNISONE 10 MG PO TABS
ORAL_TABLET | ORAL | 0 refills | Status: AC
  Filled 2024-03-20: qty 30, 12d supply, fill #0

## 2024-03-20 MED ORDER — GADOBUTROL 1 MMOL/ML IV SOLN
10.0000 mL | Freq: Once | INTRAVENOUS | Status: AC | PRN
  Administered 2024-03-20: 10 mL via INTRAVENOUS

## 2024-03-20 MED ORDER — INSULIN GLARGINE-YFGN 100 UNIT/ML ~~LOC~~ SOLN
15.0000 [IU] | Freq: Two times a day (BID) | SUBCUTANEOUS | Status: DC
  Administered 2024-03-20 (×2): 15 [IU] via SUBCUTANEOUS
  Filled 2024-03-20 (×3): qty 0.15

## 2024-03-20 MED ORDER — ACETAMINOPHEN 325 MG PO TABS: 650.0000 mg | ORAL_TABLET | Freq: Four times a day (QID) | ORAL | Status: AC | PRN

## 2024-03-20 MED ORDER — WARFARIN SODIUM 5 MG PO TABS
5.0000 mg | ORAL_TABLET | Freq: Once | ORAL | Status: AC
  Administered 2024-03-20: 5 mg via ORAL
  Filled 2024-03-20: qty 1

## 2024-03-20 NOTE — Discharge Instructions (Addendum)

## 2024-03-20 NOTE — Progress Notes (Signed)
 PHARMACY - ANTICOAGULATION CONSULT NOTE  Pharmacy Consult for warfarin Indication: Hx of VTE 2019  Allergies  Allergen Reactions   Vitamin D     Other Reaction(s): Rash on face   Penicillins Itching and Rash   Saxagliptin Rash    Patient Measurements: Height: 5' 4 (162.6 cm) Weight: (!) 160 kg (352 lb 11.8 oz) (Wt from 01/15/2024) IBW/kg (Calculated) : 54.7 HEPARIN  DW (KG): 95.9  Vital Signs: Temp: 97.4 F (36.3 C) (11/13 0717) Temp Source: Oral (11/13 0717) BP: 125/58 (11/13 0717) Pulse Rate: 74 (11/13 0717)  Labs: Recent Labs    03/18/24 0401 03/19/24 0439 03/20/24 0515  HGB 9.0* 9.3* 9.4*  HCT 28.3* 29.3* 29.4*  PLT 447* 531* 552*  LABPROT 30.5* 28.1* 27.7*  INR 2.8* 2.5* 2.4*  CREATININE  --  0.85  --     Estimated Creatinine Clearance: 102.2 mL/min (by C-G formula based on SCr of 0.85 mg/dL).   Medical History: Past Medical History:  Diagnosis Date   Diabetes mellitus without complication (HCC)    Fat necrosis 05/28/2018   Hidradenitis suppurativa 05/28/2018   Hypertension    Morbid obesity (HCC)    Right second toe ulcer (HCC) 05/28/2018    Assessment: Pharmacy consulted to dose warfarin during inpatient admission. Patient presented to the ED with a chief complaint of right ankle pain and admitted for sepsis with unknown etiology. Upon chart review, patient is on warfarin 5 mg daily PTA for history of VTE in 2019. Pt previously deemed not candidate for DOAC therapy d/t body habitus (BMI>60). INR upon admission was therapeutic at 2.   11/13 AM update: INR 2.4  Goal of Therapy:  INR 2-3 Monitor platelets by anticoagulation protocol: Yes   Plan:  - warfarin 5 mg po x1 dose today - Monitor daily INR and signs/symptoms of bleeding  Sharyne Glatter, PharmD, BCCCP Critical Care Clinical Pharmacist 03/20/2024 7:46 AM

## 2024-03-20 NOTE — Plan of Care (Signed)
   Problem: Education: Goal: Knowledge of General Education information will improve Description: Including pain rating scale, medication(s)/side effects and non-pharmacologic comfort measures Outcome: Progressing   Problem: Clinical Measurements: Goal: Ability to maintain clinical measurements within normal limits will improve Outcome: Progressing Goal: Will remain free from infection Outcome: Progressing

## 2024-03-20 NOTE — Care Management (Signed)
 Spoke with Nursing over the phone. The patient does not gfeel she can get in cab, mostly wheelchair bound. She has a husband who cannot pick her up because he is working. She has a wheelchair fleeta that picks her up for appts.  Called [facility] to see if they could transport her home. They could however they need someone present in the home for transfer of care. She states her husband cannot pick her up until the orienting. [facility] will not do as patient does not meet criteria and noone to meet at home according to the patient.

## 2024-03-20 NOTE — Progress Notes (Signed)
 Occupational Therapy Treatment Patient Details Name: Natasha Rollins MRN: [id number] DOB: [birth year] Today's Date: 03/20/2024   History of present illness Pt is 63 year old presented to [facility] on  03/14/24 for R ankle pain and sepsis/UTI. R ankle imaging negative for acute findings. PMH - PE, DM, morbid obesity, falls.   OT comments  Pt is making great progress towards their acute OT goals. Pt with improved mood this date and reporting that she feels much more like herself. Overall she did not need physical assist for mobility and reported minimal ankle pain. She continues to need minimal assist for LB ADLs but reports her husband can assist her at home if needed. OT to continue to follow acutely to facilitate progress towards established goals to discharge home without the need for follow up OT.        If plan is discharge home, recommend the following:  Assist for transportation;Assistance with cooking/housework;A little help with walking and/or transfers;A little help with bathing/dressing/bathroom   Equipment Recommendations  None recommended by OT       Precautions / Restrictions Precautions Precautions: Fall Recall of Precautions/Restrictions: Intact Restrictions Weight Bearing Restrictions Per Provider Order: No Other Position/Activity Restrictions: Body habitus.       Mobility Bed Mobility               General bed mobility comments: OOB on arrival    Transfers Overall transfer level: Needs assistance Equipment used: Rolling walker (2 wheels) Transfers: Sit to/from Stand Sit to Stand: From elevated surface, Supervision     Step pivot transfers: Contact guard assist, Supervision     General transfer comment: CGA initally, quickly  progressed to supervision A     Balance Overall balance assessment: Needs assistance Sitting-balance support: Feet supported Sitting balance-Leahy Scale: Good                                     ADL either  performed or assessed with clinical judgement   ADL Overall ADL's : Needs assistance/impaired     Grooming: Contact guard assist;Standing           Upper Body Dressing : Set up;Sitting   Lower Body Dressing: Minimal assistance Lower Body Dressing Details (indicate cue type and reason): pt reports she wears compression stockings at home and her husband can assist with those and LB ADLs if needed.             Functional mobility during ADLs: Contact guard assist General ADL Comments: Pt prefers RW positioned taller so she can prop her elbows    Extremity/Trunk Assessment Upper Extremity Assessment Upper Extremity Assessment: Generalized weakness   Lower Extremity Assessment Lower Extremity Assessment: Defer to PT evaluation        Vision   Vision Assessment?: No apparent visual deficits   Perception Perception Perception: Not tested   Praxis Praxis Praxis: Not tested   Communication Communication Communication: No apparent difficulties   Cognition Arousal: Alert Behavior During Therapy: WFL for tasks assessed/performed Cognition: No apparent impairments             OT - Cognition Comments: feeling much more like herself                 Following commands: Intact        Cueing   Cueing Techniques: Verbal cues        General Comments VSS    Pertinent  Vitals/ Pain       Pain Assessment Pain Assessment: Faces Faces Pain Scale: Hurts a little bit Pain Location: R ankle Pain Descriptors / Indicators: Sore Pain Intervention(s): Limited activity within patient's tolerance, Monitored during session   Frequency  Min 2X/week        Progress Toward Goals  OT Goals(current goals can now be found in the care plan section)  Progress towards OT goals: Progressing toward goals  Acute Rehab OT Goals Patient Stated Goal: to get home OT Goal Formulation: With patient Time For Goal Achievement: 04/01/24 Potential to Achieve Goals: Good ADL  Goals Pt Will Perform Lower Body Dressing: with modified independence;sit to/from stand;sitting/lateral leans Pt Will Transfer to Toilet: with modified independence;ambulating;regular height toilet   AM-PAC OT 6 Clicks Daily Activity     Outcome Measure   Help from another person eating meals?: None Help from another person taking care of personal grooming?: A Little Help from another person toileting, which includes using toliet, bedpan, or urinal?: A Little Help from another person bathing (including washing, rinsing, drying)?: A Little Help from another person to put on and taking off regular upper body clothing?: A Little Help from another person to put on and taking off regular lower body clothing?: A Little 6 Click Score: 19    End of Session Equipment Utilized During Treatment: Rolling walker (2 wheels)  OT Visit Diagnosis: Unsteadiness on feet (R26.81);Pain Pain - Right/Left: Right Pain - part of body: Ankle and joints of foot   Activity Tolerance Patient tolerated treatment well   Patient Left in chair;with call bell/phone within reach   Nurse Communication Mobility status        Time: 1410-1431 OT Time Calculation (min): 21 min  Charges: OT General Charges $OT Visit: 1 Visit OT Treatments $Self Care/Home Management : 8-22 mins  Lucie Kendall, OTR/L Acute Rehabilitation Services Office [phone] Secure Chat Communication Preferred   Lucie JONETTA Kendall 03/20/2024, 3:11 PM

## 2024-03-20 NOTE — Progress Notes (Signed)
 Physical Therapy Treatment Patient Details Name: Natasha Rollins MRN: [id number] DOB: 29-Apr-1961 Today's Date: 03/20/2024   History of Present Illness Pt is 63 year old presented to [facility] on  03/14/24 for R ankle pain and sepsis/UTI. R ankle imaging negative for acute findings. PMH - PE, DM, morbid obesity, falls.    PT Comments  Pt states I feel like myself today, endorses little to no R ankle pain and states she has been mobilizing well with RN staff. Pt demonstrating improving activity tolerance, gait stability this date, states she feels close to baseline. Pt states her husband will help as needed once d/c, and wants to continue OPPT in the pool. Pt appropriate to d/c from a PT standpoint.     If plan is discharge home, recommend the following: A little help with walking and/or transfers;A little help with bathing/dressing/bathroom   Can travel by private vehicle        Equipment Recommendations  None recommended by PT    Recommendations for Other Services       Precautions / Restrictions Precautions Precautions: Fall Recall of Precautions/Restrictions: Intact Restrictions Weight Bearing Restrictions Per Provider Order: No Other Position/Activity Restrictions: Body habitus.     Mobility  Bed Mobility Overal bed mobility: Modified Independent       Supine to sit: Modified independent (Device/Increase time), HOB elevated, Used rails          Transfers Overall transfer level: Needs assistance Equipment used: Rolling walker (2 wheels) Transfers: Sit to/from Stand Sit to Stand: From elevated surface, Supervision           General transfer comment: for safety, slow to rise and min cues for correct hand placement when rising/sitting    Ambulation/Gait Ambulation/Gait assistance: Contact guard assist Gait Distance (Feet): 50 Feet (+25) Assistive device: Rolling walker (2 wheels) Gait Pattern/deviations: Step-through pattern, Decreased step length - right,  Decreased step length - left, Trunk flexed, Antalgic Gait velocity: decr     General Gait Details: cues for upright posture, proximity to RW, room navigation. No buckling noted, seated rest x1   Stairs             Wheelchair Mobility     Tilt Bed    Modified Rankin (Stroke Patients Only)       Balance Overall balance assessment: Needs assistance Sitting-balance support: Feet supported Sitting balance-Leahy Scale: Good     Standing balance support: Bilateral upper extremity supported, During functional activity, Reliant on assistive device for balance Standing balance-Leahy Scale: Poor                              Communication Communication Communication: No apparent difficulties  Cognition Arousal: Alert Behavior During Therapy: WFL for tasks assessed/performed   PT - Cognitive impairments: No apparent impairments                         Following commands: Intact      Cueing Cueing Techniques: Verbal cues  Exercises      General Comments General comments (skin integrity, edema, etc.): VSS      Pertinent Vitals/Pain Pain Assessment Pain Assessment: Faces Faces Pain Scale: Hurts little more Pain Location: R ankle Pain Descriptors / Indicators: Sore, Discomfort, Grimacing, Guarding Pain Intervention(s): Limited activity within patient's tolerance, Monitored during session, Repositioned    Home Living  Prior Function            PT Goals (current goals can now be found in the care plan section) Acute Rehab PT Goals Patient Stated Goal: to go home PT Goal Formulation: With patient Time For Goal Achievement: 04/01/24 Potential to Achieve Goals: Good Progress towards PT goals: Progressing toward goals    Frequency    Min 3X/week      PT Plan      Co-evaluation              AM-PAC PT 6 Clicks Mobility   Outcome Measure  Help needed turning from your back to your side  while in a flat bed without using bedrails?: A Little Help needed moving from lying on your back to sitting on the side of a flat bed without using bedrails?: A Little Help needed moving to and from a bed to a chair (including a wheelchair)?: A Little Help needed standing up from a chair using your arms (e.g., wheelchair or bedside chair)?: A Little Help needed to walk in hospital room?: A Little Help needed climbing 3-5 steps with a railing? : A Lot 6 Click Score: 17    End of Session Equipment Utilized During Treatment: Gait belt Activity Tolerance: Patient tolerated treatment well Patient left: with call bell/phone within reach;in chair;Other (comment) (OT present to begin session) Nurse Communication: Mobility status PT Visit Diagnosis: Unsteadiness on feet (R26.81);Other abnormalities of gait and mobility (R26.89);Difficulty in walking, not elsewhere classified (R26.2)     Time: 1340-1403 PT Time Calculation (min) (ACUTE ONLY): 23 min  Charges:    $Gait Training: 8-22 mins $Therapeutic Activity: 8-22 mins PT General Charges $$ ACUTE PT VISIT: 1 Visit                     [name], PT DPT Acute Rehabilitation Services Secure Chat Preferred  Office [phone]    [name] 03/20/2024, 4:21 PM

## 2024-03-20 NOTE — Discharge Summary (Signed)
 Physician Discharge Summary  Natasha Rollins FMW:[id number] DOB: [birth year]  PCP: Verena Mems, MD  Admitted from: Home Discharged to: Home  Admit date: 03/14/2024 Discharge date: 03/20/2024  Recommendations for Outpatient Follow-up:    Follow-up Information     Gershon Donnice SAUNDERS, DPM. Schedule an appointment as soon as possible for a visit.   Specialties: Podiatry, Surgery Why: To be seen in the next 3 to 4 days.  Call their office for an appointment ASAP. Contact information: [address] [place] [address] [phone]         Verena Mems, MD. Schedule an appointment as soon as possible for a visit in 1 week(s).   Specialty: Family Medicine Why: To be seen with repeat labs (CBC, CMP, PT, INR). Contact information: [address] [place] [address] [phone]         [facility] Follow up.   Specialty: Rehabilitation Contact information: [address] [place]  [address] [phone]                 Home Health: Outpatient PT    Equipment/Devices: None    Discharge Condition: Improved and stable.   Code Status: Full Code Diet recommendation:  Discharge Diet Orders (From admission, onward)     Start     Ordered   03/20/24 0000  Diet - low sodium heart healthy        03/20/24 1527   03/20/24 0000  Diet Carb Modified        03/20/24 1527             Discharge Diagnoses:  Principal Problem:   Sepsis [facility])   Brief Hospital Course:  63 year old married female, ambulates with the help of a walker, medical history significant for Morbid obesity, HTN, DM2, prior bilateral ankle pain for which she follows with podiatry and has received bilateral ankle injections, last in February 2025, presented with complaints of 1 week history of progressively worsening bilateral ankle pain and swelling, right worse than the left to  an extent that she was unable to weight-bear and thereby could not even go see her podiatrist for an appointment that she had scheduled.  She reportedly was seen in the ED, noted to have leukocytosis, was discharged home and returned with worsening symptoms.  In ED, low-grade fever of 100.7 F, had sustained tachycardia in the 120s-110s, transient soft blood pressures, WBC 20.7.  She was admitted for presumed sepsis of unknown etiology and started on empiric broad-spectrum IV antibiotics.     Blood cultures are negative, ankle x-rays without acute findings, suspect that she has more inflammatory related bilateral ankle pain rather than infectious etiology.  Discontinued all antibiotics 11/12.  Communicated with Dr. Gershon her primary podiatrist on 11/12, started p.o. prednisone 40 mg daily and obtain MRI of ankles with chronic findings and no acute findings.  Improved on p.o. prednisone.  DC with close outpatient follow-up with Dr. Gershon.     Assessment & Plan:    Bilateral ankle pain and swelling, right > left Suspecting acute inflammatory arthritis. History as noted above X-rays of bilateral ankle 11/9 without acute bony abnormalities.  Mild arthritic changes of left ankle. CRP 27.7, ESR 110.  WBC 20.7 which is trended down to 16.3.  Venous lactate negative. MRSA PCR screen negative.  Blood cultures x 2: 11/7, negative. Had been on IV cefazolin and vancomycin since 11/7, completed 4 days course and was stopped on  11/10.  Monitor off of antibiotics As noted above, suspect that her presentation was more inflammatory than infectious.  Communicated with Dr. Gershon, podiatry on 11/12.  Obtained MRI of both ankles with detailed report as below.  Mostly chronic findings.  Mild myositis on the right and mild subcutaneous edema bilaterally.  Started on p.o. prednisone 40 mg daily yesterday with clinical improvement.  Plan on discharging her home on steroid taper with close outpatient follow-up with Dr.  Gershon in the office. PT and OT evaluated, recommend outpatient PT. Although she met SIRS criteria, do not feel that she had sepsis.  Sepsis ruled out. CRP down to 1.4.  Uric acid upper limit of normal/7.1.  Slightly worsened leukocytosis likely related to steroid initiation.   Essential hypertension Controlled on amlodipine , losartan and HCTZ, continue.   Dyslipidemia Continue rosuvastatin times per week.   History of VTE Continue warfarin per pharmacy protocol.  Anticoagulated.  INR 2.4.  Outpatient follow-up.   Normocytic anemia ?  Anemia of chronic disease. Anemia stable. Leukocytosis and Rosina felt to be reactive and now due to steroids.  Follow CBC as outpatient.   Hypomagnesemia Hypophosphatemia Replaced.   Type II DM/IDDM A1c 6.1 on 11/8 and 6.4 on 5/21. Extensively reviewed her home meds with to pharmacist who confirmed the regimen below.  Continue same with close outpatient follow-up.  Incidental findings on CT C/A/P with contrast on 03/14/2024 Outpatient follow-up and evaluation as deemed necessary - Slightly enlarged thyroid  with multiple nodules - Hepatic steatosis - Chronic 2.4 cm hypodense splenic lesion - 13 mm fat-containing mass in the left adrenal, consistent with myolipoma.  Additional 2.3 cm fat-containing mass in the left adrenal also consistent with myolipoma. - Calcified uterine mass consistent with fibroid.     Body mass index is 60.55 kg/m./Very morbid obesity Complicates care.  Outpatient follow-up.     Consultants:   None   Procedures:       Discharge Instructions  Discharge Instructions     Ambulatory referral to Physical Therapy   Complete by: As directed    Iontophoresis - 4 mg/ml of dexamethasone : No   T.E.N.S. Unit Evaluation and Dispense as Indicated: No   Call MD for:  difficulty breathing, headache or visual disturbances   Complete by: As directed    Call MD for:  extreme fatigue   Complete by: As directed    Call MD  for:  persistant dizziness or light-headedness   Complete by: As directed    Call MD for:  persistant nausea and vomiting   Complete by: As directed    Call MD for:  redness, tenderness, or signs of infection (pain, swelling, redness, odor or green/yellow discharge around incision site)   Complete by: As directed    Call MD for:  severe uncontrolled pain   Complete by: As directed    Call MD for:  temperature >100.4   Complete by: As directed    Diet - low sodium heart healthy   Complete by: As directed    Diet Carb Modified   Complete by: As directed    Increase activity slowly   Complete by: As directed         Medication List     TAKE these medications    acetaminophen  325 MG tablet Commonly known as: TYLENOL  Take 2 tablets (650 mg total) by mouth every 6 (six) hours as needed for mild pain (pain score 1-3) or moderate pain (pain score 4-6).   amLODipine  10 MG tablet Commonly  known as: NORVASC  Take 10 mg by mouth daily.   B-D UF III MINI PEN NEEDLES 31G X 5 MM Misc Generic drug: Insulin  Pen Needle USE AS DIRECTED 5 TIMES A DAY   Dexcom G7 Sensor Misc as directed.   folic acid  1 MG tablet Commonly known as: FOLVITE  Take 1 mg by mouth daily.   Lantus  SoloStar 100 UNIT/ML Solostar Pen Generic drug: insulin  glargine Inject 30 Units into the skin 2 (two) times daily.   losartan-hydrochlorothiazide  100-25 MG tablet Commonly known as: HYZAAR Take 1 tablet by mouth daily.   Mounjaro 15 MG/0.5ML Pen Generic drug: tirzepatide Inject 15 mg into the skin once a week.   NovoLOG  FlexPen 100 UNIT/ML FlexPen Generic drug: insulin  aspart Inject 25-35 Units into the skin 3 (three) times daily with meals.   OneTouch Delica Plus Lancet30G Misc USE AS DIRECTED ONCE A DAY.   predniSONE 10 MG tablet Commonly known as: DELTASONE Take 4 tabs (40 mg total) daily x 3 days, then 3 tabs (30 mg total) daily x 3 days, then 2 tabs (20 mg total) daily x 3 days, then 1 tab (10 mg  total) daily x 3 days, then stop. Start taking on: March 21, 2024   pregabalin  50 MG capsule Commonly known as: LYRICA  pregabalin  50 mg capsule  TAKE 1 CAPSULE BY MOUTH TWICE A DAY   rosuvastatin 10 MG tablet Commonly known as: CRESTOR rosuvastatin 10 mg tablet  TAKE 1 TABLET BY MOUTH FOR 3 DAYS A WEEK   Vitamin D (Ergocalciferol) 1.25 MG (50000 UNIT) Caps capsule Commonly known as: DRISDOL Take 50,000 Units by mouth once a week.   warfarin 5 MG tablet Commonly known as: COUMADIN  Take 1 tablet (5 mg total) by mouth daily at 6 PM.       Allergies  Allergen Reactions   Vitamin D     Other Reaction(s): Rash on face   Penicillins Itching and Rash   Saxagliptin Rash      Procedures/Studies: MR ANKLE RIGHT W WO CONTRAST Result Date: 03/20/2024 EXAM DESCRIPTION: MR ANKLE RIGHT W WO CONTRAST CLINICAL HISTORY: Pain. COMPARISON: None Available. TECHNIQUE: MRI of the ankle is performed according to our usual protocol with multiplanar multi sequence imaging. FINDINGS: No fracture. No erosions. Mild to moderate degenerative change to the second and third tarsometatarsal joints with joint space narrowing, small osteophytes, and mild degenerative edema. This is seen to a lesser extent to the fourth. The marrow signal is otherwise unremarkable. Small calcaneal spurs. Pes planus. Small likely reactive tibiotalar joint effusion. Adequate fat in the sinus tarsi. Mild myositis to the medial plantar musculature which may be related to altered mechanics or strain. There is a least a near full-thickness tear of the peroneus longus tendon with most fibers retracted/delaminated several centimeters. This is best seen on the axial sequences. The tendons are otherwise unremarkable. The ligaments are intact. Mild subcutaneous edema. IMPRESSION: There is a chronic appearing at least near full-thickness tear of the peroneus longus tendon with most fibers retracted/delaminated several centimeters. This is  best seen on the axial sequences. Mild to moderate degenerative change greatest to the second and third tarsometatarsal joints. Also small calcaneal spurs. Pes planus. Mild nonspecific myositis to the medial plantar musculature. Mild subcutaneous edema. Electronically signed by: Reyes Frees MD 03/20/2024 10:06 AM EST RP Workstation: [id number]   MR ANKLE LEFT W WO CONTRAST Result Date: 03/20/2024 EXAM DESCRIPTION: MR ANKLE LEFT W WO CONTRAST CLINICAL HISTORY: Pain. COMPARISON: None Available. TECHNIQUE: MRI of the  ankle is performed according to our usual protocol with multiplanar multi sequence imaging. FINDINGS: No fracture. No erosions. Pes planus. Calcaneal spurs. Mild degenerative change to the second through fourth tarsometatarsal joints. This is greatest to the dorsal second with joint space narrowing and mild degenerative edema. The marrow signal is otherwise unremarkable. Small likely reactive tibiotalar joint effusion. Adequate fat in the sinus tarsi. The musculature is unremarkable. The tendons are unremarkable. There is a chronic partial tear to the anterior talofibular ligament with thinned and indistinct fibers. The ligaments are otherwise unremarkable. Mild subcutaneous edema. IMPRESSION: Mild degenerative change greatest to the dorsal second tarsometatarsal joint. Calcaneal spurs. Pes planus. Mild subcutaneous edema. Electronically signed by: Reyes Frees MD 03/20/2024 10:01 AM EST RP Workstation: [id number]   DG Ankle 2 Views Right Result Date: 03/16/2024 EXAM: 2 VIEW(S) XRAY OF THE ANKLE 03/16/2024 06:08:00 PM CLINICAL HISTORY: Pain COMPARISON: None available. FINDINGS: BONES AND JOINTS: Plantar calcaneal spur. No acute fracture. No joint dislocation. SOFT TISSUES: Subcutaneous soft tissue edema. Vascular calcifications. IMPRESSION: 1. No acute osseous abnormality. 2. Subcutaneous soft tissue edema. Electronically signed by: Oneil Devonshire MD 03/16/2024 07:49 PM EST RP Workstation:  [id number]   DG Ankle 2 Views Left Result Date: 03/16/2024 EXAM: 2 VIEW(S) XRAY OF THE LEFT ANKLE 03/16/2024 06:08:00 PM CLINICAL HISTORY: Left ankle pain COMPARISON: None available. FINDINGS: BONES AND JOINTS: No acute fracture. Small inferior calcaneal spur. Mild tibiotalar joint osteoarthritis. No joint dislocation. SOFT TISSUES: Diffuse subcutaneous edema. Diffuse vascular calcifications. IMPRESSION: 1. No acute osseous abnormality. 2. Mild tibiotalar joint osteoarthritis. 3. Small inferior calcaneal spur. Electronically signed by: Oneil Devonshire MD 03/16/2024 07:48 PM EST RP Workstation: [id number]   CT CHEST ABDOMEN PELVIS W CONTRAST Result Date: 03/14/2024 CLINICAL DATA:  Sepsis EXAM: CT CHEST, ABDOMEN, AND PELVIS WITH CONTRAST TECHNIQUE: Multidetector CT imaging of the chest, abdomen and pelvis was performed following the standard protocol during bolus administration of intravenous contrast. RADIATION DOSE REDUCTION: This exam was performed according to the departmental dose-optimization program which includes automated exposure control, adjustment of the mA and/or kV according to patient size and/or use of iterative reconstruction technique. CONTRAST:  OMNIPAQUE  IOHEXOL  300 MG/ML  SOLN COMPARISON:  Chest x-ray 03/14/2024, CT 09/25/2023, 09/26/2023, multiple prior exams dating back to March 12, 2018 FINDINGS: CT CHEST FINDINGS Cardiovascular: Mild atherosclerosis. No aneurysm. Mild coronary vascular calcification. Normal cardiac size. No pericardial effusion Mediastinum/Nodes: Patent trachea. Slightly enlarged thyroid  with multiple nodules. This has been evaluated on previous imaging. (ref: J Am Coll Radiol. 2015 Feb;12(2): 143-50). No suspicious lymph nodes. Esophagus within normal limits Lungs/Pleura: 6 no acute airspace disease, pleural effusion or pneumothorax Musculoskeletal: Sternum appears intact. No acute osseous abnormality CT ABDOMEN PELVIS FINDINGS Hepatobiliary: Hepatic steatosis.  Cholecystectomy. No biliary dilatation. Pancreas: Unremarkable. No pancreatic ductal dilatation or surrounding inflammatory changes. Spleen: Chronic 2.4 cm hypodense splenic lesion. Adrenals/Urinary Tract: Right adrenal gland is normal. 13 mm fat containing mass in the left adrenal, consistent with myelolipoma. Additional 2.3 cm fat containing mass in the left adrenal also consistent with myelolipoma. No imaging follow-up recommended. Nonspecific perinephric fat stranding. No hydronephrosis. The bladder is unremarkable. Delayed images for acquired however only include the pelvis and not the kidneys Stomach/Bowel: Stomach within normal limits. No dilated small bowel. Negative appendix. No acute bowel wall thickening Vascular/Lymphatic: Aortic atherosclerosis. No enlarged abdominal or pelvic lymph nodes. Reproductive: Calcified uterine mass consistent with a fibroid. No suspicious adnexal mass Other: Negative for ascites or free air Musculoskeletal: No acute or suspicious osseous lesion IMPRESSION:  1. No CT evidence for acute intrathoracic, intra-abdominal, or intrapelvic abnormality. 2. Hepatic steatosis. 3. Multiple stable chronic findings as described above. 4. Aortic atherosclerosis. Aortic Atherosclerosis (ICD10-I70.0). Electronically Signed   By: Luke Bun M.D.   On: 03/14/2024 16:19   DG Chest Port 1 View Result Date: 03/14/2024 EXAM: 1 VIEW(S) XRAY OF THE CHEST 03/14/2024 12:41:00 PM COMPARISON: 11/02/2023 CLINICAL HISTORY: Questionable sepsis - evaluate for abnormality FINDINGS: LIMITATIONS/ARTIFACTS: Mild limitations secondary to patient body habitus and AP portable technique. Patient rotated left. LUNGS AND PLEURA: No focal pulmonary opacity. No pulmonary edema. No pleural effusion. No pneumothorax. HEART AND MEDIASTINUM: No acute abnormality of the cardiac and mediastinal silhouettes. BONES AND SOFT TISSUES: No acute osseous abnormality. IMPRESSION: 1. No acute cardiopulmonary process 2. Mild study  limitations due to body habitus, AP portable technique, and leftward rotation Electronically signed by: Rockey Kilts MD 03/14/2024 01:59 PM EST RP Workstation: [id number]      Subjective: Seen this morning.  Feels much better.  Bilateral ankle pain and swelling significantly better.  She was excited about this.  States that she is able to now lift her legs without pain.  Was also appreciated that we were able to get in touch with her podiatrist.  No other complaints reported.  Discharge Exam:  Vitals:   03/19/24 1934 03/20/24 0344 03/20/24 0717 03/20/24 1551  BP: 130/66 134/61 (!) 125/58 (!) 143/58  Pulse: 75 74 74 85  Resp: 13 16 16 16   Temp: 98.3 F (36.8 C) 97.9 F (36.6 C) (!) 97.4 F (36.3 C) 97.6 F (36.4 C)  TempSrc: Oral  Oral Oral  SpO2: 97% 100% 98% 98%  Weight:      Height:        General exam: Middle-age female, moderately built and morbidly obese lying comfortably supine in bed without distress. Respiratory system: Clear to auscultation. Respiratory effort normal. Cardiovascular system: S1 & S2 heard, RRR. No JVD, murmurs, rubs, gallops or clicks. No pedal edema.  Gastrointestinal system: Abdomen is nondistended, soft and nontender. No organomegaly or masses felt. Normal bowel sounds heard. Central nervous system: Alert and oriented. No focal neurological deficits. Extremities: Symmetric 5 x 5 power.  Bilateral ankles with ?mild chronic boggy swelling but otherwise no acute findings.  Specifically no erythema, tenderness, increased warmth or even painful range of movements. Skin: No rashes, lesions or ulcers Psychiatry: Judgement and insight appear normal. Mood & affect appropriate.     The results of significant diagnostics from this hospitalization (including imaging, microbiology, ancillary and laboratory) are listed below for reference.     Microbiology: Recent Results (from the past 240 hours)  Blood Culture (routine x 2)     Status: None   Collection Time:  03/14/24 12:14 PM   Specimen: BLOOD  Result Value Ref Range Status   Specimen Description   Final    BLOOD LEFT ANTECUBITAL Performed at [facility], [address], [place], [place] [address]    Special Requests   Final    BOTTLES DRAWN AEROBIC AND ANAEROBIC Blood Culture adequate volume   Culture   Final    NO GROWTH 5 DAYS Performed at [facility], [address] [place], [place] [address]    Report Status 03/19/2024 FINAL  Final  Blood Culture (routine x 2)     Status: None   Collection Time: 03/14/24 12:19 PM   Specimen: BLOOD RIGHT FOREARM  Result Value Ref Range Status   Specimen Description BLOOD RIGHT FOREARM  Final  Special Requests   Final    BOTTLES DRAWN AEROBIC AND ANAEROBIC Blood Culture adequate volume   Culture   Final    NO GROWTH 5 DAYS Performed at [facility], [address] [place], [place] [address]    Report Status 03/19/2024 FINAL  Final  Resp panel by RT-PCR (RSV, Flu A&B, Covid) Anterior Nasal Swab     Status: None   Collection Time: 03/14/24 12:52 PM   Specimen: Anterior Nasal Swab  Result Value Ref Range Status   SARS Coronavirus 2 by RT PCR NEGATIVE NEGATIVE Final    Comment: (NOTE) SARS-CoV-2 target nucleic acids are NOT DETECTED.  The SARS-CoV-2 RNA is generally detectable in upper respiratory specimens during the acute phase of infection. The lowest concentration of SARS-CoV-2 viral copies this assay can detect is 138 copies/mL. A negative result does not preclude SARS-Cov-2 infection and should not be used as the sole basis for treatment or other patient management decisions. A negative result may occur with  improper specimen collection/handling, submission of specimen other than nasopharyngeal swab, presence of viral mutation(s) within the areas targeted by this assay, and inadequate number of viral copies(<138 copies/mL). A negative result must be combined with clinical observations, patient  history, and epidemiological information. The expected result is Negative.  Fact Sheet for Patients:  bloggercourse.com  Fact Sheet for Healthcare Providers:  seriousbroker.it  This test is no t yet approved or cleared by the [place]  FDA and  has been authorized for detection and/or diagnosis of SARS-CoV-2 by FDA under an Emergency Use Authorization (EUA). This EUA will remain  in effect (meaning this test can be used) for the duration of the COVID-19 declaration under Section 564(b)(1) of the Act, 21 U.S.C.section 360bbb-3(b)(1), unless the authorization is terminated  or revoked sooner.       Influenza A by PCR NEGATIVE NEGATIVE Final   Influenza B by PCR NEGATIVE NEGATIVE Final    Comment: (NOTE) The Xpert Xpress SARS-CoV-2/FLU/RSV plus assay is intended as an aid in the diagnosis of influenza from Nasopharyngeal swab specimens and should not be used as a sole basis for treatment. Nasal washings and aspirates are unacceptable for Xpert Xpress SARS-CoV-2/FLU/RSV testing.  Fact Sheet for Patients: bloggercourse.com  Fact Sheet for Healthcare Providers: seriousbroker.it  This test is not yet approved or cleared by the [place]  FDA and has been authorized for detection and/or diagnosis of SARS-CoV-2 by FDA under an Emergency Use Authorization (EUA). This EUA will remain in effect (meaning this test can be used) for the duration of the COVID-19 declaration under Section 564(b)(1) of the Act, 21 U.S.C. section 360bbb-3(b)(1), unless the authorization is terminated or revoked.     Resp Syncytial Virus by PCR NEGATIVE NEGATIVE Final    Comment: (NOTE) Fact Sheet for Patients: bloggercourse.com  Fact Sheet for Healthcare Providers: seriousbroker.it  This test is not yet approved or cleared by the [place]  FDA  and has been authorized for detection and/or diagnosis of SARS-CoV-2 by FDA under an Emergency Use Authorization (EUA). This EUA will remain in effect (meaning this test can be used) for the duration of the COVID-19 declaration under Section 564(b)(1) of the Act, 21 U.S.C. section 360bbb-3(b)(1), unless the authorization is terminated or revoked.  Performed at [facility], [address], [place], [place] [address]   MRSA Next Gen by PCR, Nasal     Status: None   Collection Time: 03/17/24  6:06 PM   Specimen: Nasal Mucosa; Nasal Swab  Result Value Ref Range Status   MRSA by PCR Next Gen NOT DETECTED NOT DETECTED Final    Comment: (NOTE) The GeneXpert MRSA Assay (FDA approved for NASAL specimens only), is one component of a comprehensive MRSA colonization surveillance program. It is not intended to diagnose MRSA infection nor to guide or monitor treatment for MRSA infections. Test performance is not FDA approved in patients less than 11 years old. Performed at [facility], [address], [place], [place] [address]      Labs: CBC: Recent Labs  Lab 03/14/24 1252 03/15/24 1110 03/17/24 0634 03/18/24 0401 03/19/24 0439 03/20/24 0515  WBC 20.7* 20.2* 14.6* 15.5* 16.3* 18.5*  NEUTROABS 17.7*  --   --   --   --   --   HGB 9.8* 10.1* 9.7* 9.0* 9.3* 9.4*  HCT 31.9* 31.9* 30.5* 28.3* 29.3* 29.4*  MCV 86.7 86.2 84.3 84.7 84.4 84.7  PLT 367 345 460* 447* 531* 552*    Basic Metabolic Panel: Recent Labs  Lab 03/14/24 1252 03/15/24 1110 03/17/24 0634 03/18/24 0401 03/19/24 0439  NA 140  --  137  --  141  K 3.4*  --  3.7  --  3.8  CL 98  --  100  --  101  CO2 29  --  27  --  27  GLUCOSE 160*  --  142*  --  138*  BUN 22  --  22  --  21  CREATININE 1.28* 1.12* 1.10*  --  0.85  CALCIUM 9.5  --  8.6*  --  8.6*  MG 1.5*  --  1.6* 1.5* 1.7  PHOS  --   --  2.4* 2.4* 3.5    Liver Function Tests: Recent Labs  Lab 03/14/24 1252  AST 31  ALT 16   ALKPHOS 64  BILITOT 1.2  PROT 7.3  ALBUMIN 3.6    CBG: Recent Labs  Lab 03/19/24 1116 03/19/24 1606 03/19/24 2106 03/20/24 0611 03/20/24 1207  GLUCAP 200* 209* 291* 235* 249*     Urinalysis    Component Value Date/Time   COLORURINE YELLOW 03/14/2024 1558   APPEARANCEUR CLEAR 03/14/2024 1558   LABSPEC 1.019 03/14/2024 1558   PHURINE 5.5 03/14/2024 1558   GLUCOSEU NEGATIVE 03/14/2024 1558   HGBUR MODERATE (A) 03/14/2024 1558   BILIRUBINUR NEGATIVE 03/14/2024 1558   BILIRUBINUR neg 05/15/2013 1759   KETONESUR 15 (A) 03/14/2024 1558   PROTEINUR 30 (A) 03/14/2024 1558   UROBILINOGEN 0.2 05/15/2013 1759   UROBILINOGEN 0.2 04/03/2012 1251   NITRITE NEGATIVE 03/14/2024 1558   LEUKOCYTESUR NEGATIVE 03/14/2024 1558      Time coordinating discharge: 35 minutes  SIGNED:  Trenda Mar, MD,  FACP, [facility], [facility], [facility]   [facility] [place]     To contact the attending provider between 7A-7P or the covering provider during after hours 7P-7A, please log into the web site [email or web address] and access using universal [place] password for that web site. If you do not have the password, please call the hospital operator.

## 2024-03-20 NOTE — Telephone Encounter (Signed)
 Patient states the hospital team is trying to release her tomorrow. She wants to make sure she is sturdy enough to avoid falling. She wants to come directly to see you after discharge and get an injection. When the patient is released she will call to get scheduled.

## 2024-03-20 NOTE — Progress Notes (Signed)
 Reviewed AVS, patient expressed understanding of medications, MD follow up reviewed.   Patient states all belongings brought to the hospital at time of admission are accounted for and packed to take home.  Patient is refusing discharge at this time because she feels like she can not walk safely.  Patient states she uses a radio broadcast assistant to transport her and they are by appointment only and it was too late in the day to schedule.  Patient states with her condition she can not ride in a car. Called ED TOC and was offered a merchant [employer] officer service but patient not eligible at this time because husband is not home to help.

## 2024-03-20 NOTE — Plan of Care (Signed)

## 2024-03-21 ENCOUNTER — Encounter: Payer: Self-pay | Admitting: Podiatry

## 2024-03-21 ENCOUNTER — Ambulatory Visit: Admitting: Podiatry

## 2024-03-21 ENCOUNTER — Other Ambulatory Visit (HOSPITAL_COMMUNITY): Payer: Self-pay

## 2024-03-21 DIAGNOSIS — B351 Tinea unguium: Secondary | ICD-10-CM | POA: Diagnosis not present

## 2024-03-21 DIAGNOSIS — M7751 Other enthesopathy of right foot: Secondary | ICD-10-CM | POA: Diagnosis not present

## 2024-03-21 DIAGNOSIS — M25572 Pain in left ankle and joints of left foot: Secondary | ICD-10-CM | POA: Diagnosis not present

## 2024-03-21 DIAGNOSIS — M79675 Pain in left toe(s): Secondary | ICD-10-CM | POA: Diagnosis not present

## 2024-03-21 DIAGNOSIS — M7752 Other enthesopathy of left foot: Secondary | ICD-10-CM

## 2024-03-21 DIAGNOSIS — M79674 Pain in right toe(s): Secondary | ICD-10-CM

## 2024-03-21 DIAGNOSIS — M25571 Pain in right ankle and joints of right foot: Secondary | ICD-10-CM | POA: Diagnosis not present

## 2024-03-21 LAB — GLUCOSE, CAPILLARY
Glucose-Capillary: 195 mg/dL — ABNORMAL HIGH (ref 70–99)
Glucose-Capillary: 191 mg/dL — ABNORMAL HIGH (ref 70–99)

## 2024-03-21 LAB — PROTIME-INR
INR: 2.8 — ABNORMAL HIGH (ref 0.8–1.2)
Prothrombin Time: 31 s — ABNORMAL HIGH (ref 11.4–15.2)

## 2024-03-21 MED ORDER — INSULIN ASPART 100 UNIT/ML IJ SOLN: 0.0000 [IU] | Freq: Every day | INTRAMUSCULAR | Status: DC

## 2024-03-21 MED ORDER — INSULIN ASPART 100 UNIT/ML IJ SOLN
4.0000 [IU] | Freq: Three times a day (TID) | INTRAMUSCULAR | Status: DC
  Administered 2024-03-21: 4 [IU] via SUBCUTANEOUS
  Filled 2024-03-21: qty 4

## 2024-03-21 MED ORDER — INSULIN GLARGINE-YFGN 100 UNIT/ML ~~LOC~~ SOLN
30.0000 [IU] | Freq: Two times a day (BID) | SUBCUTANEOUS | Status: DC
  Administered 2024-03-21: 30 [IU] via SUBCUTANEOUS
  Filled 2024-03-21 (×2): qty 0.3

## 2024-03-21 MED ORDER — INSULIN ASPART 100 UNIT/ML IJ SOLN
0.0000 [IU] | Freq: Three times a day (TID) | INTRAMUSCULAR | Status: DC
  Administered 2024-03-21: 3 [IU] via SUBCUTANEOUS
  Filled 2024-03-21: qty 3

## 2024-03-21 NOTE — Progress Notes (Signed)
 [facility] Progress Note  Patient was supposed to DC on 11/13 but did not leave due to transportation barriers.  Interviewed and examined patient.  She reports feeling great.  Indicated that this was the best that she is felt for a long time as far as her ankle pain.  Did well with PT and ambulated 75 feet.  No complaints reported  Sitting up comfortably in chair eating breakfast. Vitals:   03/20/24 1551 03/20/24 1949 03/21/24 0453 03/21/24 0808  BP: (!) 143/58 138/70 133/68 (!) 144/75  Pulse: 85 87 74 96  Resp: 16 16 16 17   Temp: 97.6 F (36.4 C) 98.1 F (36.7 C) 98 F (36.7 C) 97.6 F (36.4 C)  TempSrc: Oral Oral Oral   SpO2: 98% 100% 100% 99%  Weight:      Height:      RS: Clear to auscultation.  No increased work of breathing. CVS: S1 and S2 heard, RRR.  No JVD, murmurs or pedal edema. Extremities: Bilateral ankles, nonacute.  CBGs reviewed: Up to 395 mg per DL last night.  INR 2.8  Assessment and plan: Bilateral ankle pain, right more than left, suspected flare of inflammatory/degenerative arthritis rather than infectious etiology: Has done well on prednisone, complete taper, close outpatient follow-up with podiatry  Type II DM/IDDM with hyperglycemia: Worsening CBGs here due to being on lower than home dose of insulins and prednisone.  At discharge, continue prior home regimen of Lantus  Solostar 30 units twice daily (increased this morning prior to discharge), and she also takes NovoLog  between 25-35 units with meals based on her CBGs.  Prior to this admission, her glycemic control was good.  She follows with Dr. Tommas, Endocrinology.  Patient indicates that they are in the process of trying to get her a CGM.  Advised her that her CBGs may be higher due to the prednisone but as her prednisone is tapered off to stop, they should improve.  She can follow-up with her Endocrinologist regarding adjustment of her insulins as needed.  She verbalized understanding.  She plans to call her  endocrinologist.  Rest of hospital course as per DC summary from 11/13.  Trenda Mar, MD,  FACP, [facility], [facility], [facility]   [facility] [place]     To contact the attending provider between 7A-7P or the covering provider during after hours 7P-7A, please log into the web site [email or web address] and access using universal [place] password for that web site. If you do not have the password, please call the hospital operator.

## 2024-03-21 NOTE — Progress Notes (Signed)
 Subjective: Chief Complaint  Patient presents with   Foot Pain    Bilateral lateral ankle pain. 6 pain . IDDM A1C? Sugars ranging in the 300's at hospital. Needs toenail trim.     63 year old female presents with above concerns.  Patient states last week she was trying to get the appointment as she was having quite a bit of discomfort to her ankles has not had an injection in some time.  However she had a fall that she has been to the hospital and initially concern for sepsis.  After further evaluation it was deemed that most likely due to result of inflammatory arthritis.  She was started on prednisone and she states that after starting the prednisone her symptoms substantially improved and she is feeling much better now.    Also needs her nails trimmed today as they are thickened elongated and are causing discomfort.  Objective: AAO x3, NAD DP/PT pulses palpable bilaterally, CRT less than 3 seconds Nails are hypertrophic, dystrophic, brittle, discolored, elongated 10. No surrounding redness or drainage. Tenderness nails 1-5 bilaterally. No open lesions or pre-ulcerative lesions are identified today. On the left foot there is tenderness palpation of the sinus tarsi.  There is some trace edema to this area.  There is mild discomfort on the anterior ankle joint line bilaterally but there is no edema, erythema to this area.  There is no pain or crepitation with ankle joint range of motion.  There is no erythema or warmth.  There is no areas of pinpoint tenderness identified at this time. No pain with calf compression, swelling, warmth, erythema  Assessment: Capsulitis, left ankle; symptomatic onychomycosis  Plan: -All treatment options discussed with the patient including all alternatives, risks, complications.  -Reviewed the MRI from the hospital.  Some fluid present in the ankle joint.  Clinically does not appear to be septic and she has responded well send starting the prednisone.  As she  has been doing well she was to hold off on injections today.  Will see how she does when she finishes her course of oral antibiotics. Lorean debrided nails x 10 without complications or bleeding   Return for Injection in 2-3 weeks .  Donnice JONELLE Fees DPM

## 2024-03-21 NOTE — Progress Notes (Signed)
 Occupational Therapy Treatment Patient Details Name: Natasha Rollins MRN: [id number] DOB: 1960-05-14 Today's Date: 03/21/2024   History of present illness Pt is 63 year old presented to [facility] on  03/14/24 for R ankle pain and sepsis/UTI. R ankle imaging negative for acute findings. PMH - PE, DM, morbid obesity, falls.   OT comments  Pt consistently able to stand from elevated surfaces with verbal cues for hand placement and use of momentum with supervision. Completed toileting with BSC over toilet mod I and grooming with set up. No c/o pain throughout session. Happy to sit up in recliner as she typically uses her w/c as her sitting surface. Pt eager to go home.       If plan is discharge home, recommend the following:  Assist for transportation;Assistance with cooking/housework;A little help with walking and/or transfers;A little help with bathing/dressing/bathroom   Equipment Recommendations  None recommended by OT    Recommendations for Other Services      Precautions / Restrictions Precautions Precautions: Fall Recall of Precautions/Restrictions: Intact Restrictions Weight Bearing Restrictions Per Provider Order: No       Mobility Bed Mobility Overal bed mobility: Modified Independent             General bed mobility comments: HOB up    Transfers Overall transfer level: Needs assistance Equipment used: Rolling walker (2 wheels) Transfers: Sit to/from Stand Sit to Stand: From elevated surface, Supervision           General transfer comment: use of momentum, no physical assist from bed or BSC over toilet, self cues hand placement     Balance Overall balance assessment: Needs assistance   Sitting balance-Leahy Scale: Good     Standing balance support: Bilateral upper extremity supported, During functional activity, Reliant on assistive device for balance Standing balance-Leahy Scale: Poor                             ADL either performed or  assessed with clinical judgement   ADL Overall ADL's : Needs assistance/impaired     Grooming: Wash/dry hands;Set up;Sitting                   Toilet Transfer: Supervision/safety;Rolling walker (2 wheels);BSC/3in1 Toilet Transfer Details (indicate cue type and reason): BSC over toilet Toileting- Clothing Manipulation and Hygiene: Modified independent       Functional mobility during ADLs: Supervision/safety;Rolling walker (2 wheels) General ADL Comments: Pt prefers RW positioned taller so she can prop her elbows    Extremity/Trunk Assessment              Vision       Perception     Praxis     Communication Communication Communication: No apparent difficulties   Cognition Arousal: Alert Behavior During Therapy: WFL for tasks assessed/performed Cognition: No apparent impairments                               Following commands: Intact        Cueing   Cueing Techniques: Verbal cues  Exercises      Shoulder Instructions       General Comments      Pertinent Vitals/ Pain       Pain Assessment Pain Assessment: Faces Faces Pain Scale: No hurt  Home Living  Prior Functioning/Environment              Frequency  Min 2X/week        Progress Toward Goals  OT Goals(current goals can now be found in the care plan section)  Progress towards OT goals: Progressing toward goals  Acute Rehab OT Goals OT Goal Formulation: With patient Time For Goal Achievement: 04/01/24 Potential to Achieve Goals: Good  Plan      Co-evaluation                 AM-PAC OT 6 Clicks Daily Activity     Outcome Measure   Help from another person eating meals?: None Help from another person taking care of personal grooming?: A Little Help from another person toileting, which includes using toliet, bedpan, or urinal?: A Little Help from another person bathing (including washing,  rinsing, drying)?: A Little Help from another person to put on and taking off regular upper body clothing?: A Little Help from another person to put on and taking off regular lower body clothing?: A Little 6 Click Score: 19    End of Session Equipment Utilized During Treatment: Rolling walker (2 wheels)  OT Visit Diagnosis: Unsteadiness on feet (R26.81);Muscle weakness (generalized) (M62.81)   Activity Tolerance Patient tolerated treatment well   Patient Left     Nurse Communication          Time: 9161-9082 OT Time Calculation (min): 39 min  Charges: OT General Charges $OT Visit: 1 Visit OT Treatments $Self Care/Home Management : 38-52 mins  Mliss HERO, OTR/L Acute Rehabilitation Services Office: [phone]   Kennth Mliss Helling 03/21/2024, 9:20 AM

## 2024-03-22 ENCOUNTER — Other Ambulatory Visit (HOSPITAL_BASED_OUTPATIENT_CLINIC_OR_DEPARTMENT_OTHER): Payer: Self-pay

## 2024-03-23 IMAGING — US US FNA BIOPSY THYROID 1ST LESION
1 series · 13 of 25 positions shown · non-contrast
Comparison: Ultrasound thyroid 05/28/2021

MEDICATIONS:
None

COMPLICATIONS:
None immediate.

INDICATION: Indeterminate thyroid nodules

EXAM:
ULTRASOUND GUIDED FINE NEEDLE ASPIRATION OF INDETERMINATE THYROID
NODULE
TECHNIQUE: Informed written consent was obtained from the patient after a
discussion of the risks, benefits and alternatives to treatment.
Questions regarding the procedure were encouraged and answered. A
timeout was performed prior to the initiation of the procedure.

[Series 1: us fna biopsy thyroid 1st lesion · 0.08mm/px · 25 acquisitions, 13 frames shown]
[im 1/25]
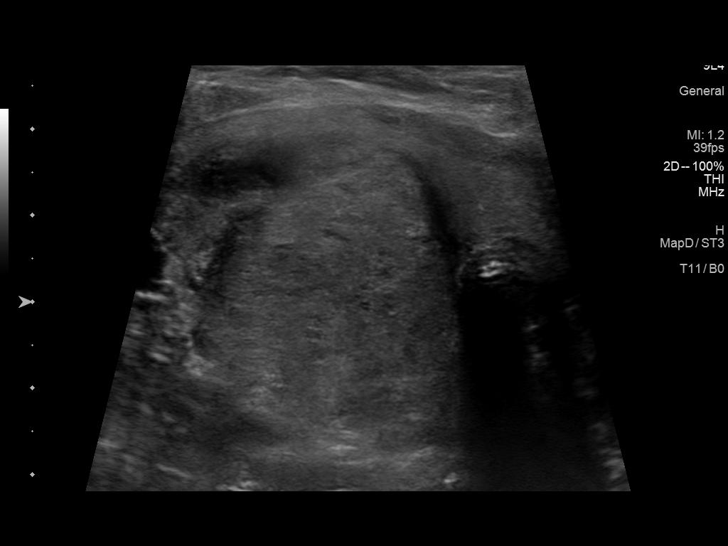
[im 3/25]
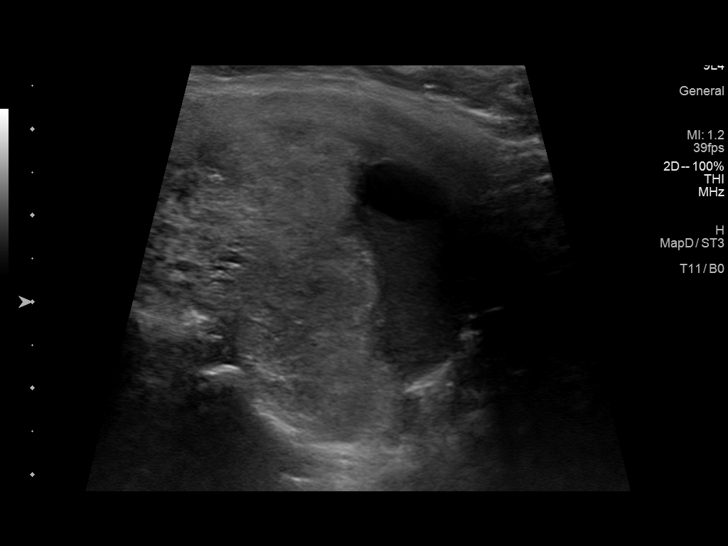
[im 5/25]
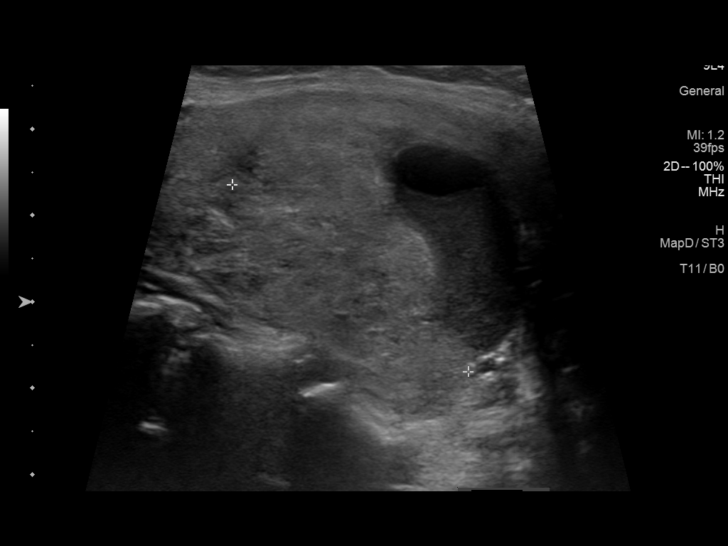
[im 7/25]
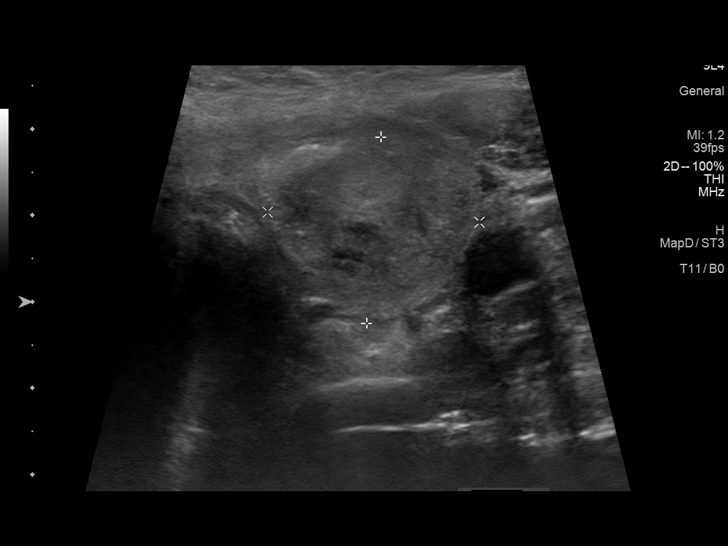
[im 9/25]
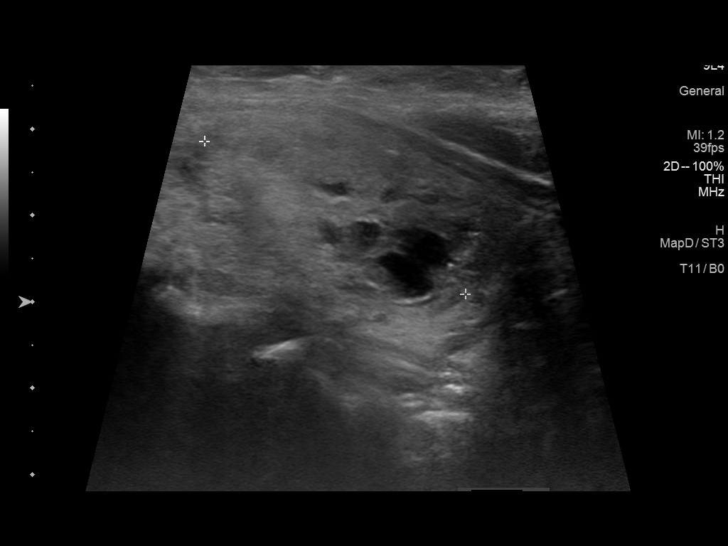
[im 11/25]
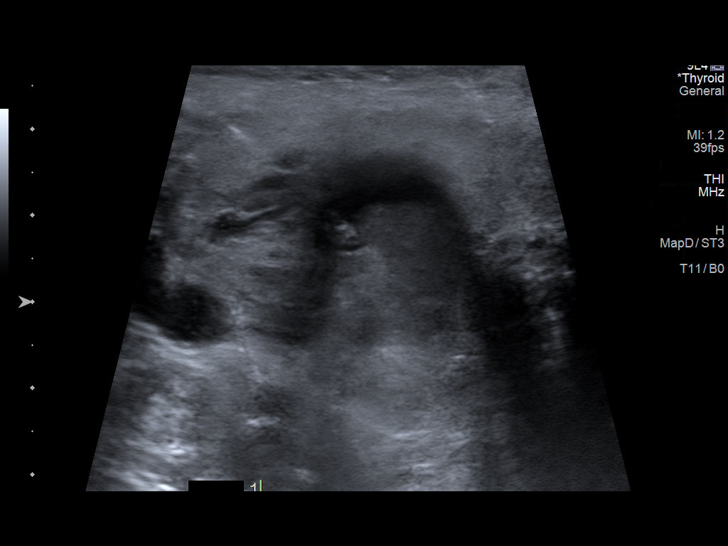
[im 13/25]
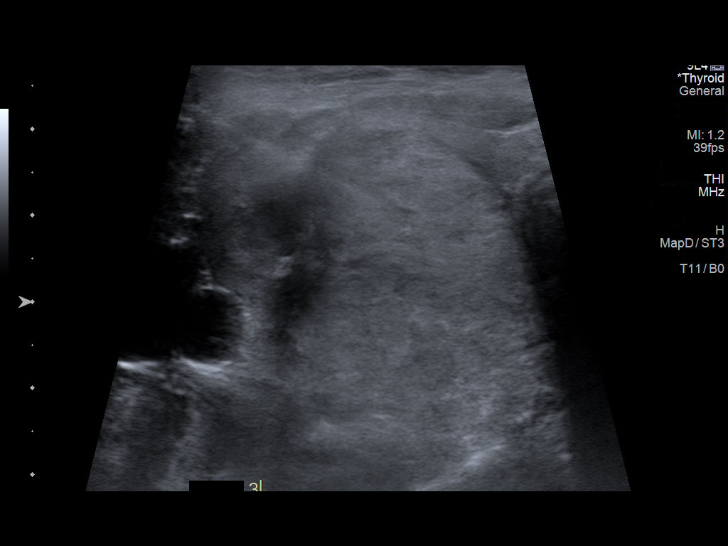
[im 15/25]
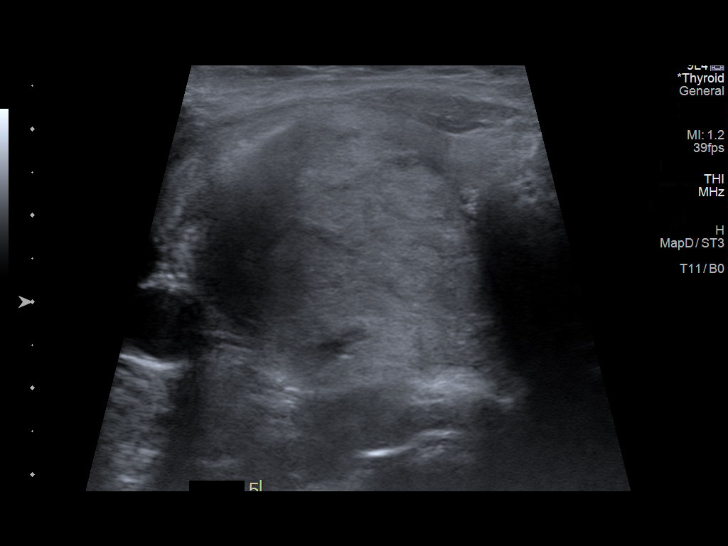
[im 17/25]
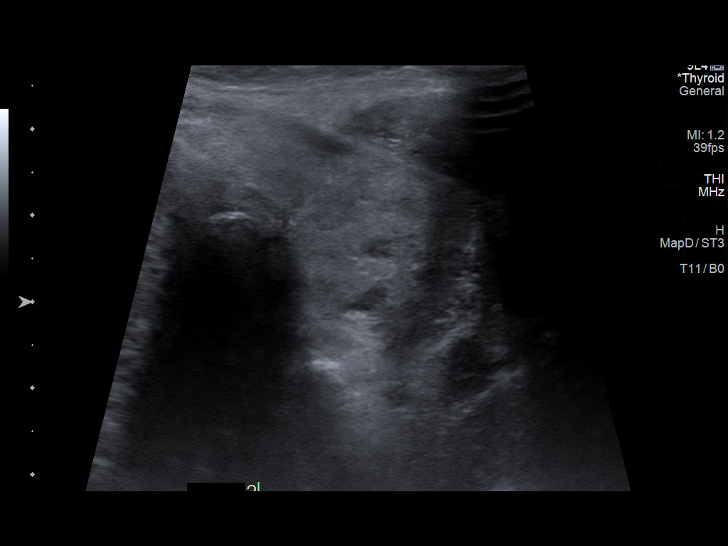
[im 19/25]
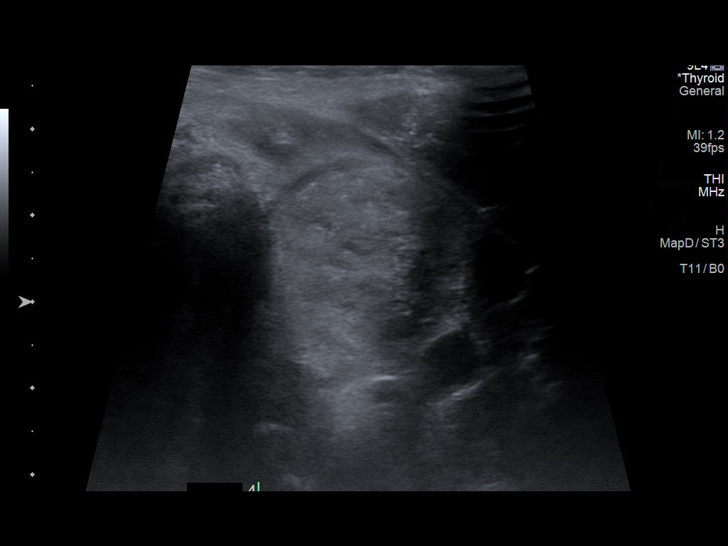
[im 21/25]
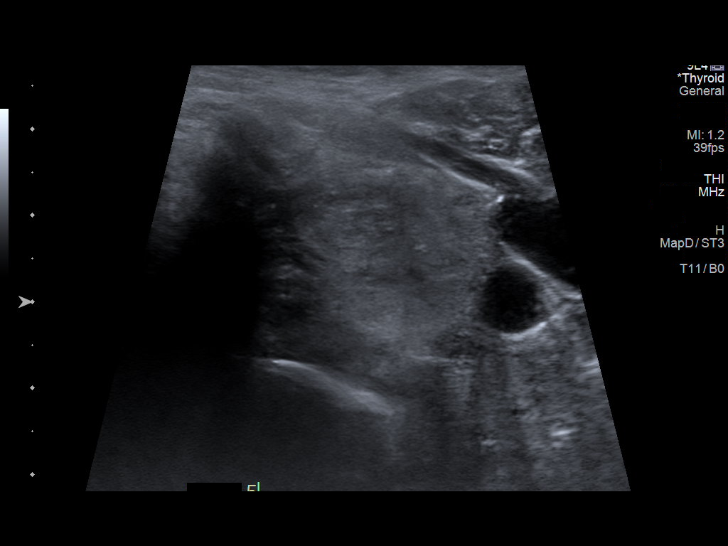
[im 23/25]
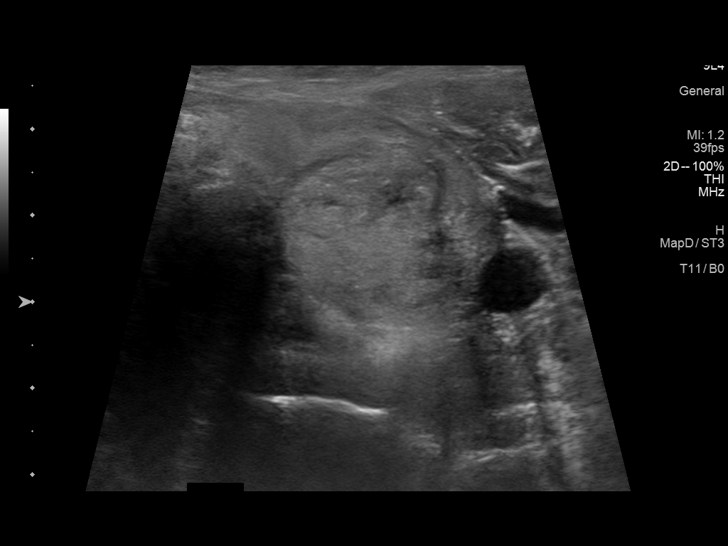
[im 25/25]
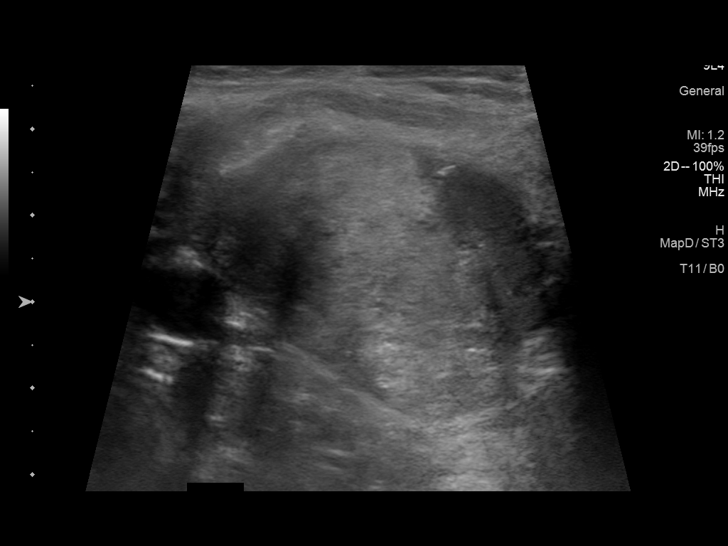

[13 of 25 positions shown; findings below may reference images not displayed]

Pre-procedural ultrasound scanning demonstrated unchanged size and
appearance of the indeterminate nodules within the right and left
lobes of the thyroid

The procedure was planned. The neck was prepped in the usual sterile
fashion, and a sterile drape was applied covering the operative
field. A timeout was performed prior to the initiation of the
procedure. Local anesthesia was provided with 1% lidocaine.

Under direct ultrasound guidance, 5 FNA biopsies were performed of
the right nodule with a 27 gauge needle. Multiple ultrasound images
were saved for procedural documentation purposes. The samples were
prepared and submitted to pathology. Two of the specimens were
reserved for Afirma testing.

Under direct ultrasound guidance, 5 FNA biopsies were performed of
the left nodule with a 27 gauge needle. Multiple ultrasound images
were saved for procedural documentation purposes. The samples were
prepared and submitted to pathology. Two of the specimens were
reserved for Afirma testing.

Limited post procedural scanning was negative for hematoma or
additional complication. Dressings were placed. The patient
tolerated the above procedures procedure well without immediate
postprocedural complication.
FINDINGS: Nodule reference number based on prior diagnostic ultrasound: 2

Maximum size: 3.4 cm

Location: Right; Mid

ACR TI-RADS risk category: TR3 (3 points)

Reason for biopsy: meets ACR TI-RADS criteria

_________________________________________________________

Nodule reference number based on prior diagnostic ultrasound: 5

Maximum size: 3.9 cm

Location: Left; Inferior

ACR TI-RADS risk category: TR3 (3 points)

Reason for biopsy: meets ACR TI-RADS criteria

Ultrasound imaging confirms appropriate placement of the needles
within the thyroid nodule.
IMPRESSION: Technically successful ultrasound guided fine needle aspiration of
right and left thyroid nodules as described above

Performed and read by Mancinza, Berther

## 2024-03-23 IMAGING — US US FNA BIOPSY THYROID 1ST LESION
1 series · 13 of 25 positions shown · non-contrast
Comparison: Ultrasound thyroid 05/28/2021

MEDICATIONS:
None

COMPLICATIONS:
None immediate.

INDICATION: Indeterminate thyroid nodules

EXAM:
ULTRASOUND GUIDED FINE NEEDLE ASPIRATION OF INDETERMINATE THYROID
NODULE
TECHNIQUE: Informed written consent was obtained from the patient after a
discussion of the risks, benefits and alternatives to treatment.
Questions regarding the procedure were encouraged and answered. A
timeout was performed prior to the initiation of the procedure.

[Series 1: us fna biopsy thyroid 1st lesion · 0.08mm/px · 25 acquisitions, 13 frames shown]
[im 1/25]
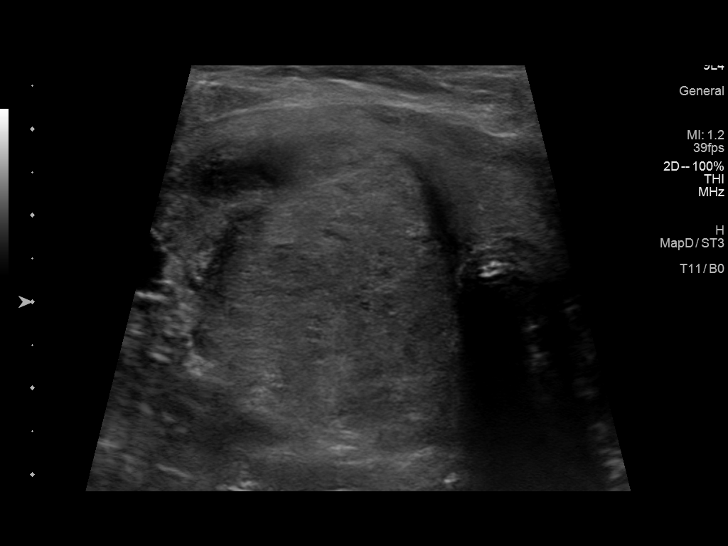
[im 3/25]
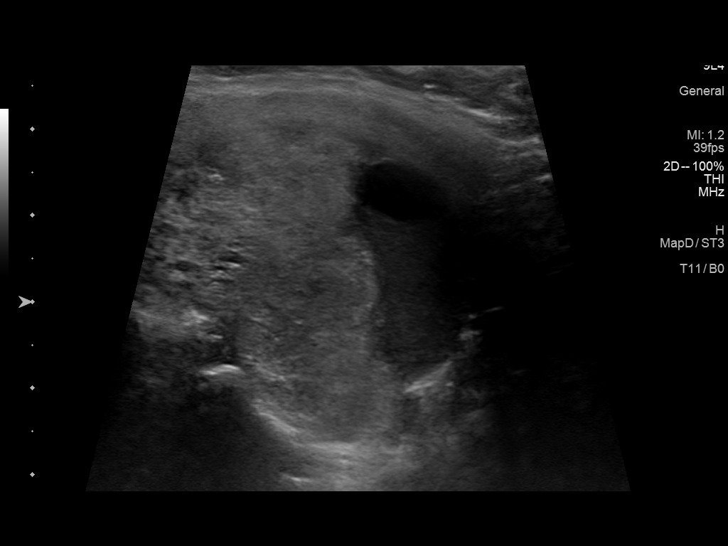
[im 5/25]
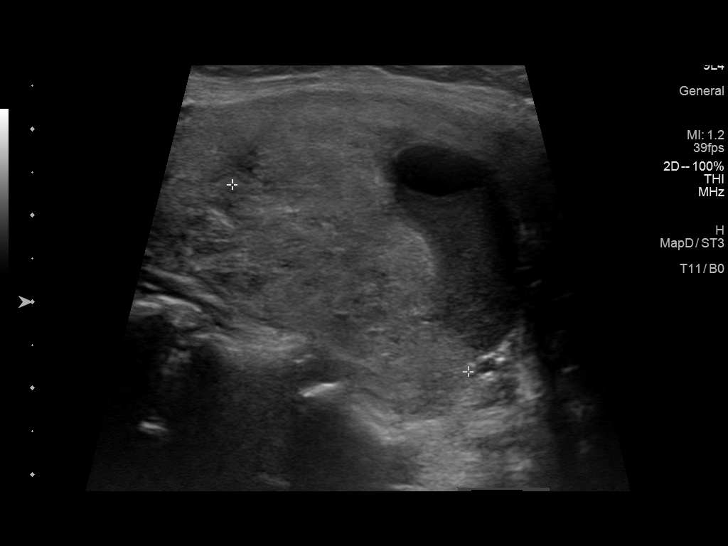
[im 7/25]
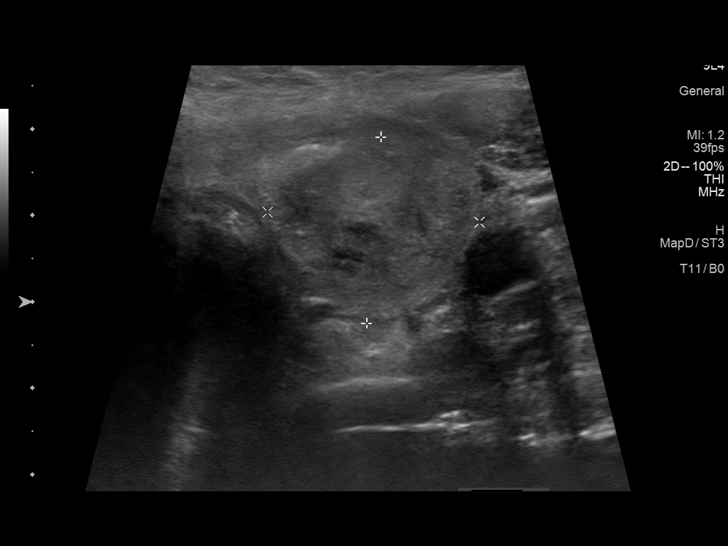
[im 9/25]
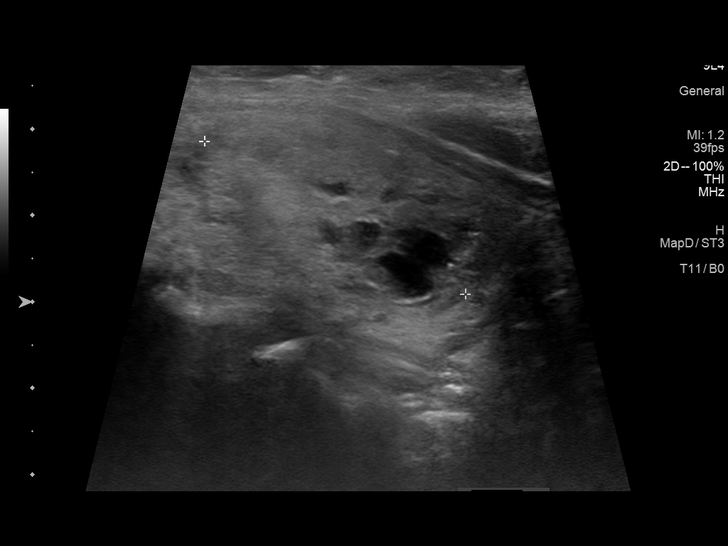
[im 11/25]
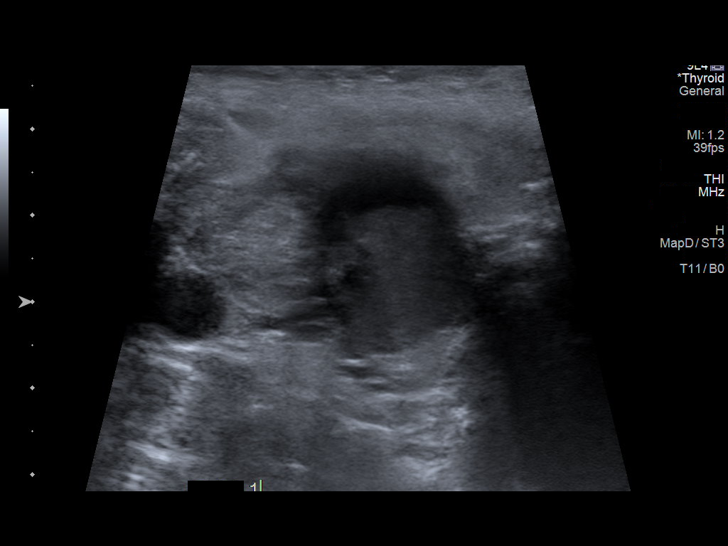
[im 13/25]
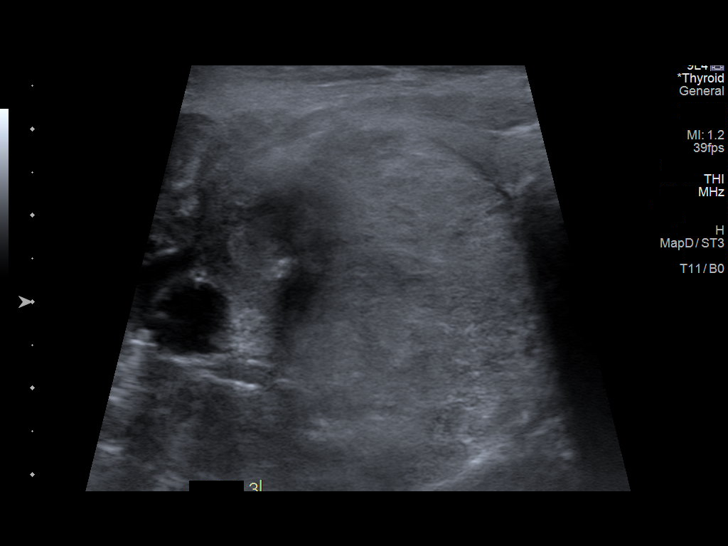
[im 15/25]
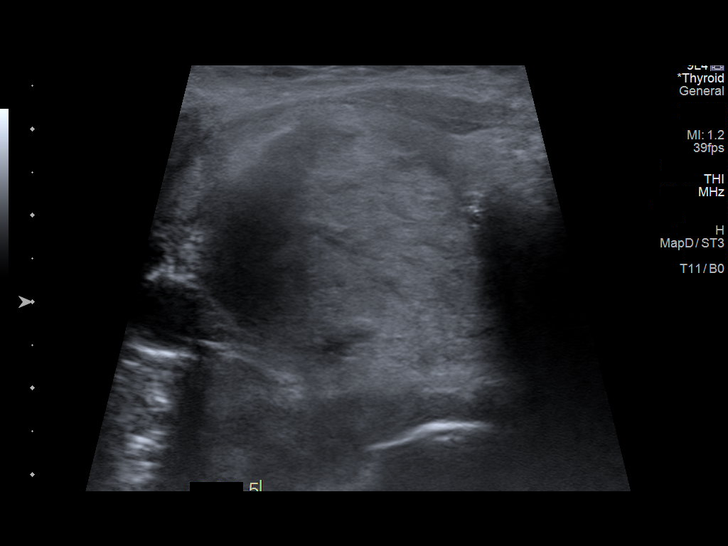
[im 17/25]
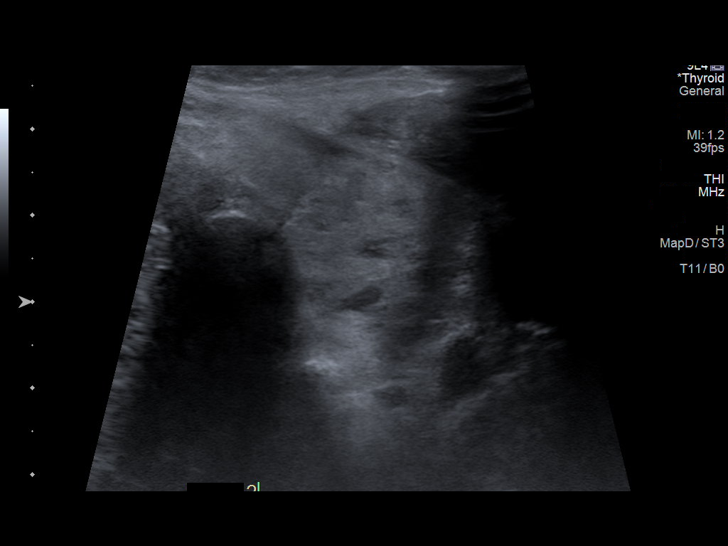
[im 19/25]
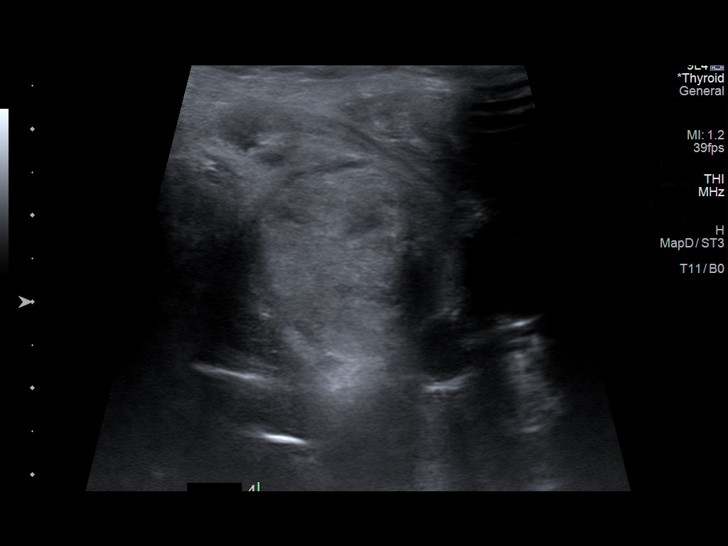
[im 21/25]
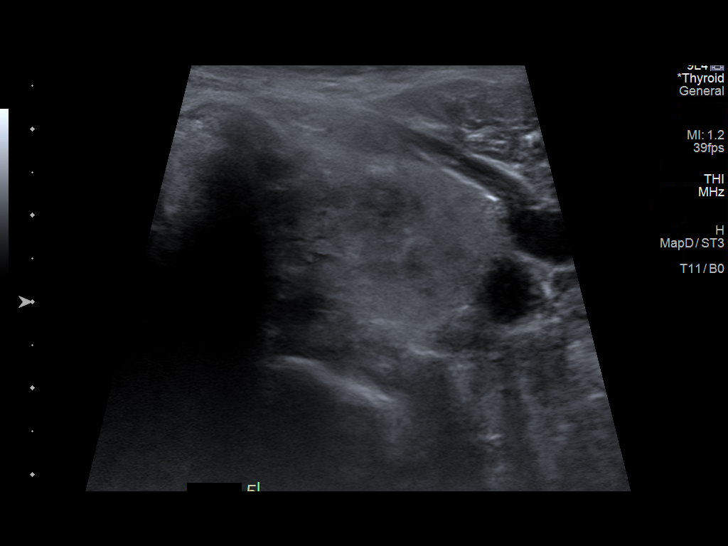
[im 23/25]
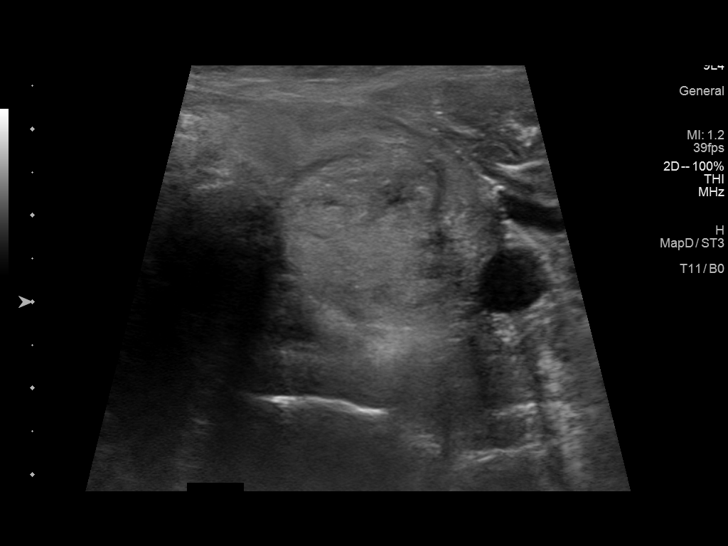
[im 25/25]
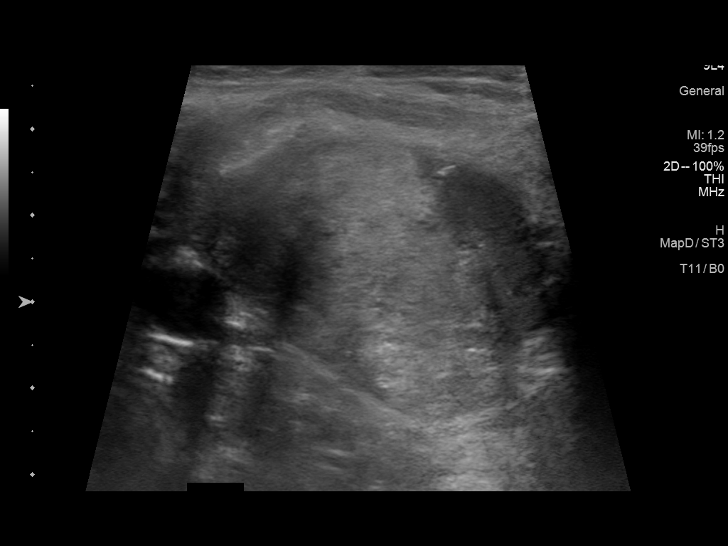

[13 of 25 positions shown; findings below may reference images not displayed]

Pre-procedural ultrasound scanning demonstrated unchanged size and
appearance of the indeterminate nodules within the right and left
lobes of the thyroid

The procedure was planned. The neck was prepped in the usual sterile
fashion, and a sterile drape was applied covering the operative
field. A timeout was performed prior to the initiation of the
procedure. Local anesthesia was provided with 1% lidocaine.

Under direct ultrasound guidance, 5 FNA biopsies were performed of
the right nodule with a 27 gauge needle. Multiple ultrasound images
were saved for procedural documentation purposes. The samples were
prepared and submitted to pathology. Two of the specimens were
reserved for Afirma testing.

Under direct ultrasound guidance, 5 FNA biopsies were performed of
the left nodule with a 27 gauge needle. Multiple ultrasound images
were saved for procedural documentation purposes. The samples were
prepared and submitted to pathology. Two of the specimens were
reserved for Afirma testing.

Limited post procedural scanning was negative for hematoma or
additional complication. Dressings were placed. The patient
tolerated the above procedures procedure well without immediate
postprocedural complication.
FINDINGS: Nodule reference number based on prior diagnostic ultrasound: 2

Maximum size: 3.4 cm

Location: Right; Mid

ACR TI-RADS risk category: TR3 (3 points)

Reason for biopsy: meets ACR TI-RADS criteria

_________________________________________________________

Nodule reference number based on prior diagnostic ultrasound: 5

Maximum size: 3.9 cm

Location: Left; Inferior

ACR TI-RADS risk category: TR3 (3 points)

Reason for biopsy: meets ACR TI-RADS criteria

Ultrasound imaging confirms appropriate placement of the needles
within the thyroid nodule.
IMPRESSION: Technically successful ultrasound guided fine needle aspiration of
right and left thyroid nodules as described above

Performed and read by Mancinza, Berther

## 2024-03-27 ENCOUNTER — Ambulatory Visit: Admitting: Podiatry

## 2024-04-07 ENCOUNTER — Ambulatory Visit (INDEPENDENT_AMBULATORY_CARE_PROVIDER_SITE_OTHER): Payer: Worker's Compensation | Admitting: Podiatry

## 2024-04-07 DIAGNOSIS — M7752 Other enthesopathy of left foot: Secondary | ICD-10-CM

## 2024-04-07 DIAGNOSIS — M7751 Other enthesopathy of right foot: Secondary | ICD-10-CM | POA: Diagnosis not present

## 2024-04-09 NOTE — Progress Notes (Signed)
 Subjective: Chief Complaint  Patient presents with   Injections    Bilateral injections     63 year old female presents with above concerns.  She said that she is doing much better than she was but she wants to proceed with steroid injections today.  She will the course of oral steroids.  No new injuries or falls.    Objective: AAO x3, NAD DP/PT pulses palpable bilaterally, CRT less than 3 seconds On the left and right foot there is tenderness palpation of the sinus tarsi.  There is some trace edema to this area, but improved.  There is no area of pinpoint tenderness identified otherwise.  She does get some tenderness in the course of the peroneal tendon on the right side but the majority of tenderness is localized along the sinus tarsi.  Ankle range of motion intact. No pain with calf compression, swelling, warmth, erythema  Assessment: Capsulitis, bilateral subtalar joint  Plan: -All treatment options discussed with the patient including all alternatives, risks, complications.  -She wishes to proceed with steroid injections today.  I cleaned the skin with Betadine, alcohol.  Mixture 1 cc betamethasone, 1 cc Marcaine plain was infiltrated into the sinus tarsi bilaterally.  Postinjection care discussed.  She tolerated well any complications.  Return for ankle pain in 6-8 weeks, possible injection .  Donnice JONELLE Fees DPM

## 2024-04-28 NOTE — H&P (Signed)
 "                     [facility] Specialist- ICU INTAKE NOTE  Today 04/28/2024 Admit Date 04/28/2024    Assessment:  63 y.o. year-old female admitted for sepsis  There are no active hospital problems to display for this patient.    Plan:  Critically ill with:  Respiratory: Patient is on room air tolerating well, able to protect her airways will continue to monitor closely  CV: Hypotension, most likely secondary to dehydration and sepsis, patient will receive another bolus of fluid will send lactic acid  Tachycardia, concerning for atrial fibrillation, will monitor for now with possibility to start amiodarone after proper hydration   Neuro: Acute change in mental status secondary metabolic encephalopathy patient oriented x 2 -Sedation: None  GI: Patient will be kept n.p.o. -Stress ulcer prophylaxis: Patient will be started on Pepcid   Renal: Acute kidney injury prerenal versus renal continue with aggressive hydration follow-up the BUN/creatinine -Electrolytes: Monitor and replace; ICU replacement protocol  Heme: Follow-up hemoglobin hematocrit -DVT prophylaxis: Patient will be started on heparin   ID: Sepsis, source urinary tract infection associated pneumonia follow-up the cultures patient will be started on broad-spectrum antibiotic's  Endocrine: Diabetes patient will be started on insulin  sign scale   Code status: Full code      History:   HPI: 63 year old female, brought to the hospital by her husband, patient is unable to give any further history due to extreme confusion history was obtained from the patient sons  at the bedside, seen that the patient been having fever for at least 2 to 3 days with nausea and vomiting therefore she was brought to the emergency room when EMS arrived at home patient was found to be soiled in urine and stool  ROS:   Could not be obtained due to her mental status  No past medical history on file.  No past  surgical history on file.  No Known Allergies  HOME MEDICATION No outpatient medications have been marked as taking for the 04/28/24 encounter [facility] Encounter).    No family history on file.      Objective:   BP 122/76   Temp (!) 102.7 F (39.3 C)   Resp 18   Wt (!) 147.3 kg (324 lb 11.8 oz)   SpO2 95%   Intake/Output Summary (Last 24 hours) at 04/28/2024 1934 Last data filed at 04/28/2024 1927 Gross per 24 hour  Intake 3000 ml  Output --  Net 3000 ml    Current Medications: heparin , 5,000 Units, Q8H Implement Electrolyte Replacement Protocol, , Once insulin  lispro, 0-7 Units, QHS [START ON 04/29/2024] insulin  lispro, 0-8 Units, TID WC LR, 1,000 mL, Once magnesium  sulfate 2g/50ml (4%) in water, 2 g, Once metroNIDAZOLE , 500 mg, Once piperacillin-tazobactam (Zosyn) IV orderable, 3,375 mg, Q8H vancomycin  (Vancocin ) IV orderable, 2,000 mg, Once    MICRO No results found for this visit on 04/28/24.  LINES:  DRIPS:  Ventilator Settings:                  PE:   Physical Exam Constitutional:      General: She is in acute distress.     Appearance: She is ill-appearing.  HENT:     Head: Normocephalic and atraumatic.     Nose: Nose normal.     Mouth/Throat:     Mouth: Mucous membranes are moist.     Pharynx: Oropharynx is clear.  Eyes:  Pupils: Pupils are equal, round, and reactive to light.  Cardiovascular:     Rate and Rhythm: Tachycardia present.  Pulmonary:     Effort: Pulmonary effort is normal.     Breath sounds: Rhonchi present.  Abdominal:     General: Bowel sounds are normal.     Palpations: Abdomen is soft.  Skin:    General: Skin is warm.      Basic Metabolic Profile  Recent Labs    04/28/24 1754  NA 143  POTASSIUM 3.8  CHLORIDE 101  CO2 24  BUN 35*  CREAT 1.8*  GLUCOSE 183*  CALCIUM  9.4  MAGNESIUM  1.4*  PHOSPHORUS 2.7      CBC(Brief) w/Diff  Lab Results  Component Value Date/Time   WBC 22.8 (H) 04/28/2024  06:20 PM   HEMOGLOBIN 12.1 04/28/2024 06:20 PM   HCT 38.9 04/28/2024 06:20 PM   MCV 91 04/28/2024 06:20 PM   PLATELET 257 04/28/2024 06:20 PM       Arterial Blood Gases  No results for input(s): PH1, PCO2, PO2, HCO3, BEBLOOD, O2SATA, TCO2 in the last 72 hours.    Coagulation Lab Results  Component Value Date   PT 26.8 (H) 04/28/2024   INR 2.60 (H) 04/28/2024   APTT 32 04/28/2024    CxR (which I have independently reviewed): Reviewed     Sami GORMAN Karlene Better, MD 04/28/2024 7:34 PM     "

## 2024-04-28 NOTE — ED Provider Notes (Signed)
 "     [facility] EMERGENCY DEPARTMENT  Time of Arrival:   04/28/24 1707   Final diagnoses:  [R57.9] Shock (HCC) (Primary)  [R00.0] Wide-complex tachycardia    Medical Decision Making:   Natasha Rollins is a 63 y.o. female with history of morbid obesity, hypertension, prior admission for UTI and sepsis presenting from home for evaluation of altered mental status.  On arrival, patient is normotensive, febrile to 103F, tachycardic from the 150s to 170s with wide-complex rhythm.  Patient is maintaining saturations of 95% on room air.  She is awake and alert but very confused, calling out for her husband and repeatedly asking for assistance, only occasionally redirectable.  Patient has dry mucous membranes and appears very hypovolemic, has had multiple attempts at PIV placement.  I was able to establish 2 ultrasound-guided peripheral IVs, 1 of which the patient subsequently pulled out.  Additional PIVs were placed by nursing staff.  Sepsis order set was initiated immediately on patient arrival including 2 blood cultures, empiric antibiotic's, and 30 mL/kg fluid bolus as well as empiric IV magnesium  given her wide-complex tachycardia.  I was wary of proceeding directly to rate or rhythm control given concern that the patient's tachycardia is compensatory and that rate controlling or converting the patient might result in hypotension when she is currently hemodynamically stable otherwise.  Point-of-care glucose was elevated at 177.  Initial lab results are concerning for significant leukocytosis to 22.8.  Per son who is arrived at bedside, patient is full code.  An aunt that he spoke to on the phone relates a history that the patient was previously admitted to an outside hospital in the [facility] for a urinary tract infection with sepsis.    UA is notable for UTI.  Lower suspicion for meningitis given overall presentation, lack of obvious nuchal rigidity, full range of motion  of the neck, no complaint of headache.  Patient will be a difficult LP given her body habitus and lack of cooperation.  Will defer empiric meningitis coverage at this time given positive urinalysis which is the most likely source of her sepsis.  Will be admitted to the critical care unit for ongoing management of her septic shock and her wide-complex tachycardia.  At time of admission, blood pressure is stable with systolics in the 110s, no indication for vasopressors after initial sepsis fluid resuscitation.  Differential/Questionable Diagnosis: Septic shock, urinary tract infection, intra-abdominal infection, pneumonia, aspiration pneumonitis, aspiration pneumonia, intracranial hemorrhage, meningitis, ventricular tachycardia, atrial fibrillation with aberrancy, supraventricular tachycardia  Supplemental Historians Include; : relative, EMS personnel, and medical records   ED Course:   ED Course as of 04/28/24 2136  Mon Apr 28, 2024  1756 Per son at bedside, patient is full code. [AB]  1810 Bilateral US  PIVs placed by myself, patient has pulled out one already and nursing at attempting PIV placement at this time for administration of fluids, antibiotics. [AB]  1824 Glucose POC(!): 177 [AB]  1834 Critical Care paged for admission. [AB]  1837 WBC(!): 22.8 [AB]  1849 INR(!): 2.60 [AB]  1903 Re-paged critical care. [AB]  1903 Magnesium (!): 1.4 2 g magnesium  sulfate hanging, will order another dose. [AB]  1904 C-Reactive Protein(!): 34.9 [AB]  1915 On reassessment, patient is sleeping quietly, arouses to light touch.  Saturations dropped to 88% on room air while asleep, placed on 2 L nasal cannula.  Heart rate is 162 bpm after 30 mL/kg fluid bolus and initial 2 mg of magnesium . [AB]  1923  Dr. Arlyn with Critical Care will see the patient.  [AB]  1929 Patient is now sitting up in bed, alert and conversant, recognizes both her sons at bedside. Pleasant and cooperative.  [AB]  1956 Lactic Acid  Plasma(!!): 4.3 [AB]  1956 Repeat perfusion exam at this time.  Patient awake and alert, still slightly confused but more appropriate. Patient continues to have dry mucous membranes, tachycardia to the 160s with blood pressure 115 systolic.  Patient has delayed capillary refill, 2 to 3 seconds.  Currently awaiting CT at this time.  Patient admitted to CCU for ongoing management. [AB]  2118 Urine WBCROLLEN): Loaded [AB]  2119 Urine Leukocyte Esterase(!): Large [AB]  2119 Urine Blood(!): Moderate [AB]  2119 Urine Nitrite: Negative [AB]  2119 Beta Hydroxy(!): 9.9 [AB]   Sepsis Labs: Recent Sepsis Labs:    04/28/24 1817 04/28/24 1907 04/28/24 2021  BLOOD CULTURE Culture received - In progress  --   --   LACTIC ACID PLASMA  --    < > 2.2 H   < > = values in this interval not displayed.   Pending Sepsis Labs: Pending      Ordered       04/28/24 1950  Lactic Acid  PROCEDURE ONCE,   STAT                  Antibiotics:   Recently Administered Antibiotics - Last 24 hours (last 24 hours)     Date/Time Action Medication Dose Rate   04/28/24 2035 New Bag   vancomycin  (Vancocin ) 2000mg /NS IVPB 2,000 mg 250 mL/hr   04/28/24 1909 New Bag   metroNIDAZOLE  (FlagyL ) 500 mg/100 mL IVPB 500 mg 500 mg 200 mL/hr   04/28/24 1831 Given   cefePIME  (Maxipime ) 2 g in SWFI 10 mL syringe 2 g 120 mL/hr       Fluid (consider 30ml/kg if shock present and prior fluids not given):   Fluid Intake - Last 24 Hours (last 24 hours)     Date/Time Action Medication Dose Rate   04/28/24 2035 New Bag   Lactated Ringers  (LR) infusion 1,000 mL 1,000 mL/hr   04/28/24 1927 New Bag   Lactated Ringers  (LR) infusion 1,000 mL 999 mL/hr   04/28/24 1832 New Bag   Lactated Ringers  (LR) infusion 1,000 mL 999 mL/hr   04/28/24 1832 New Bag   Lactated Ringers  (LR) infusion 1,000 mL 999 mL/hr       Unless otherwise specified, for BMI greater than 30 the ideal body weight was used to calculate 30 ml/kg IVF  bolus.  Additional Fluid (please document additional fluids given and not charted): N/A    Sepsis perfusion reassessment performed at (include date & time): see time course above  TOTAL CRITICAL CARE TIME: I spent a total of 45 minutes of critical care time obtaining history, conducting a physical exam, performing and monitoring interventions, ordering, collecting and interpreting tests, and establishing medical decision-making. This time does not include separately billable procedures.   Documentation/Prior Results Review : Other Electronic MEDICAL RECORD NUMBER [facility] admission records, urine culture results from May 2025  Rhythm interpretations from Monitor by me; : wide complex tachycardia  EKG 12-LEAD      ED CT HEAD NO CONTRAST    (Results Pending)  CT CHEST/ABD/PELVIS W/O CONTRAST    (Results Pending)    .   Discussion of Management with other Physicians, QHP or Appropriate Source; : Admitting team Dr. Arlyn (Critical Care)  .  Disposition:  Admit  New Prescriptions   No medications on file   Chief Complaint  Patient presents with   ALTERED MENTAL STATUS   FEVER (9 WEEKS TO 74 YEARS)    Natasha Rollins is a 63 y.o. female with history of hypertension, morbid obesity, prior UTI presenting from home for altered mental status.  Patient was found by her son, Natasha Rollins, who reports that she is confused compared to her baseline and was unable to get out of bed today.  Patient lives at home with her husband and is unclear exactly how long patient was unable to get out of bed, at least 1 day.  Patient has reportedly been vomiting and dry heaving, has not had anything to eat or drink for at least 24 hours.  The patient's son was on the phone with his aunt who reports that the patient was previously admitted to an outside hospital for sepsis and urinary tract infection a few months ago.  So far as they know, the patient has been otherwise well recently, no sick  contacts.     Review of Systems: Unable to perform ROS : mental status change    Physical Exam Constitutional:      Appearance: She is obese. She is ill-appearing.     Comments: Intermittently dry heaves. Shouting for her husband, saying I can't with you, you're just stupid, get me a doctor.  HENT:     Head: Normocephalic and atraumatic.     Nose: Nose normal.     Mouth/Throat:     Mouth: Mucous membranes are dry.     Pharynx: Oropharynx is clear.  Eyes:     General: No scleral icterus. Neck:     Comments: Patient turns her head side-to-side spontaneously.  Difficult to assess for nuchal rigidity due to habitus and patient cooperation. Cardiovascular:     Rate and Rhythm: Regular rhythm. Tachycardia present.  Pulmonary:     Effort: Pulmonary effort is normal. No respiratory distress.     Comments: Diminished throughout. Abdominal:     General: There is no distension.     Palpations: Abdomen is soft.     Tenderness: There is no abdominal tenderness. There is no guarding or rebound.  Musculoskeletal:        General: No deformity.  Skin:    General: Skin is dry.     Comments: Hot to touch.  Neurological:     Mental Status: She is disoriented.     Comments: Moves all extremities equally with good strength in bilateral upper extremities.  Disoriented, slightly combative with staff but redirectable.  Repeatedly calls out for her husband who is not present.    No past medical history on file. No past surgical history on file. No family history on file. Social History   Occupational History   Not on file  Tobacco Use   Smoking status: Not on file   Smokeless tobacco: Not on file  Substance and Sexual Activity   Alcohol use: Not on file   Drug use: Not on file   Sexual activity: Not on file   No outpatient medications have been marked as taking for the 04/28/24 encounter [facility] Encounter).   No Known Allergies  Vital Signs: Patient Vitals for the past 72  hrs:  Temp Heart Rate Pulse Resp BP BP Mean SpO2 Weight  04/28/24 2130 -- 97 -- 29 121/63 79 MM HG 97 % --  04/28/24 2045 -- 160 161 (!) 31 -- -- 97 % --  04/28/24  2026 -- 171 -- 25 -- -- 95 % --  04/28/24 2000 -- 140 -- 27 115/100 (!) 107 MM HG 97 % --  04/28/24 1956 (!) 101.1 F (38.4 C) 152 161 12 115/100 (!) 105 MM HG 99 % --  04/28/24 1900 -- 157 170 16 -- -- 92 % --  04/28/24 1815 -- 164 -- 21 -- -- -- --  04/28/24 1800 -- -- -- -- -- -- -- (!) 147.3 kg (324 lb 11.8 oz)  04/28/24 1745 -- 178 -- 22 -- -- -- --  04/28/24 1724 (!) 102.7 F (39.3 C) 150 -- 18 122/76 91 MM HG 95 % --    Diagnostics: Labs:   Results for orders placed or performed during the hospital encounter of 04/28/24  GLUCOSE SCREEN  Result Value Ref Range   Glucose POC 177 (H) 70 - 99 mg/dL  PT-INR  Result Value Ref Range   Protime 26.8 (H) 9.1 - 12.2 sec   INR 2.60 (H) 0.93 - 1.14  APTT  Result Value Ref Range   APTT 32 20 - 32 sec  BASIC METABOLIC PANEL  Result Value Ref Range   Potassium 3.8 3.5 - 5.5 mmol/L   Sodium 143 133 - 145 mmol/L   Chloride 101 98 - 110 mmol/L   Glucose 183 (H) 70 - 99 mg/dL   Calcium  9.4 8.4 - 10.5 mg/dL   BUN 35 (H) 6 - 22 mg/dL   Creatinine 1.8 (H) 0.8 - 1.4 mg/dL   CO2 24 20 - 32 mmol/L   eGFR 30.6 (L) >60.0 mL/min/1.73 sq.m.   Anion Gap 18.0 (H) 3.0 - 15.0 mmol/L  LIPASE  Result Value Ref Range   Lipase 44 7 - 60 U/L  Lactic Acid With Reflex  Result Value Ref Range   Lactic Acid Plasma 4.3 (HH) 0.5 - 2.0 mmol/L  C REACTIVE PROTEIN  Result Value Ref Range   C-Reactive Protein 34.9 (H) <=0.5 mg/dL  BETA HYDROXYBUTYRATE  Result Value Ref Range   Beta Hydroxy 9.9 (H) 0.2 - 2.8 mg/dL  MAGNESIUM  SERUM  Result Value Ref Range   Magnesium  1.4 (L) 1.6 - 2.5 mg/dL  PHOSPHORUS SERUM  Result Value Ref Range   Phosphorus 2.7 2.5 - 4.5 mg/dL  TSH  Result Value Ref Range   TSH 0.23 (L) 0.27 - 4.20 mcU/mL  CBC WITH DIFFERENTIAL AUTO  Result Value Ref Range   WBC  22.8 (H) 4.0 - 11.0 K/uL   RBC 4.27 3.80 - 5.20 M/uL   HGB 12.1 11.7 - 16.0 g/dL   HCT 61.0 64.8 - 51.9 %   MCV 91 80 - 99 fL   MCH 28 26 - 34 pg   MCHC 31 31 - 36 g/dL   RDW 81.5 (H) 89.9 - 84.4 %   Platelet 257 140 - 440 K/uL   MPV 11.5 9.0 - 13.0 fL   Segmented Neutrophils (Auto) 87 (H) 40 - 75 %   Lymphocytes (Auto) 6 (L) 20 - 45 %   Monocytes (Auto) 6 3 - 12 %   Eosinophils (Auto) 0 0 - 6 %   Basophils (Auto) 0 0 - 2 %   Absolute Neutrophils (Auto) 19.7 (H) 1.8 - 7.7 K/uL   Absolute Lymphocytes (Auto) 1.3 1.0 - 4.8 K/uL   Absolute Monocytes (Auto) 1.3 (H) 0.1 - 1.0 K/uL   Absolute Eosinophils (Auto) 0.0 0.0 - 0.5 K/uL   Absolute Basophils (Auto) 0.1 0.0 - 0.2 K/uL  APTT  Result  Value Ref Range   APTT 34 (H) 20 - 32 sec  PT-INR  Result Value Ref Range   Protime 26.8 (H) 9.1 - 12.2 sec   INR 2.60 (H) 0.93 - 1.14  Lactic Acid now  Result Value Ref Range   Lactic Acid Plasma 2.2 (H) 0.5 - 2.0 mmol/L  D-Dimer now  Result Value Ref Range   D-Dimer 1.10 (H) 0.00 - 0.49 mg/L FEU  Urinalysis w Micro Reflex Culture   Specimen: Straight Catheter; Urine  Result Value Ref Range   Source Urine     Urine Color Yellow Colorless, Pale Yellow, Light Yellow, Yellow, Dark Yellow, Straw   Urine Clarity Turbid (A) Clear   Urine pH 5.5 5.0 - 8.0 pH   Urine Protein Screen 70* (A) Negative, Trace, 10 mg/dL   Urine Glucose Negative Negative mg/dL   Urine Ketones Negative Negative mg/dL   Urine Occult Blood Moderate (A) Negative   Urine Specific Gravity 1.017 1.005 - 1.030   Urine Nitrite Negative Negative   Urine Leukocyte Esterase Large (A) Negative   Urine Bilirubin Negative Negative   Urine Urobilinogen <2.0 <2.0 mg/dL mg/dL   Urine RBC 84-79 (A) Negative, 0-2 /hpf   Urine WBC Loaded (A) 0 - 5 /hpf   Squamous Epithelial Cells 0-2 Negative, 0-2 /hpf   Hyaline Cast 5-10 (A) 0 - 2 /lpf   Mucus Urine Occ (A) (none)  RESPIRATORY SYNCYTIAL VIRUS PCR   Specimen: Nasopharyngeal Swab;  Various culture specimens  Result Value Ref Range   RSV PCR None Detected None Detected  SARS-CoV-2 PCR (COVID-19) - RAPID   Specimen: Nasopharyngeal Swab; Various culture specimens  Result Value Ref Range   SARS-CoV-2 PCR (COVID-19) Not Detected Not Detected  INFLUENZA  A  AND B PCR  Result Value Ref Range   Influenza A PCR None Detected None Detected   Influenza B PCR None Detected None Detected  BLOOD CULTURE   Specimen: Peripheral Blood  Result Value Ref Range   Culture Culture received - In progress   BLOOD CULTURE   Specimen: Peripheral Blood  Result Value Ref Range   Culture Culture received - In progress    ECG: Results for orders placed or performed during the hospital encounter of 04/28/24  EKG 12-LEAD  Result Value Ref Range Status   Heart Rate 180 bpm Preliminary   RR Interval 332 ms Preliminary   Atrial Rate 183 ms Preliminary   P-R Interval 117 ms Preliminary   P Duration 112 ms Preliminary   P Horizontal Axis 8 deg Preliminary   P Front Axis 9 deg Preliminary   Q Onset 501 ms Preliminary   QRSD Interval 140 ms Preliminary   QT Interval 318 ms Preliminary   QTcB 552 ms Preliminary   QTcF 459 ms Preliminary   QRS Horizontal Axis 228 deg Preliminary   QRS Axis -73 deg Preliminary   I-40 Front Axis -59 deg Preliminary   t-40 Horizontal Axis 226 deg Preliminary   T-40 Front Axis 269 deg Preliminary   T Horizontal Axis 43 deg Preliminary   T Wave Axis 107 deg Preliminary   S-T Horizontal Axis 68 deg Preliminary   S-T Front Axis 122 deg Preliminary   Impression - ABNORMAL ECG -  Preliminary   Impression   Preliminary    VTACH-Extreme tachycardia with wide complex, no further rhythm analysis  attempted-        Medications ordered/given in the ED Medications  vancomycin  (Vancocin ) 2000mg /NS IVPB (2,000  mg Intravenous New Bag 04/28/24 2035)  Implement Electrolyte Replacement Protocol (has no administration in time range)  D50W injection 25 mL (has  no administration in time range)  glucagon (Glucagen) injection 1 mg (has no administration in time range)  glucose (Dex4 Glucose) chew tab 4 Tab (has no administration in time range)  INSULIN  DOSING PER PHARMACY (has no administration in time range)  insulin  lispro injection 0-8 Units (has no administration in time range)  insulin  lispro injection 0-7 Units (0 Units Subcutaneous Held 04/28/24 2029)  Lactated Ringers  (LR) infusion (has no administration in time range)  VANCOMYCIN  DOSING PER LEVELS - REMINDER (has no administration in time range)  vancomycin  - level due (has no administration in time range)  piperacillin-tazobactam (Zosyn) 3,375 mg in NS 100 mL EXTENDED INFUSION (has no administration in time range)  amiodarone (Cordarone) bolus dose 150 mg (has no administration in time range)    And  amiodarone (Cordarone) 360 mg/200 mL (1.8 mg/mL) in dextrose ,iso-osm (has no administration in time range)    And  amiodarone (Cordarone) 360 mg/200 mL (1.8 mg/mL) in dextrose ,iso-osm (has no administration in time range)  Lactated Ringers  (LR) infusion (0 mL Intravenous End Infusion 04/28/24 1927)    And  Lactated Ringers  (LR) infusion (0 mL Intravenous End Infusion 04/28/24 1927)    And  Lactated Ringers  (LR) infusion (1,000 mL Intravenous New Bag 04/28/24 1927)  cefePIME  (Maxipime ) 2 g in SWFI 10 mL syringe (2 g Intravenous Given 04/28/24 1831)  metroNIDAZOLE  (FlagyL ) 500 mg/100 mL IVPB 500 mg (0 mg Intravenous End Infusion 04/28/24 1949)  magnesium  sulfate 2g/50ml (4%) in water IVPB 2 g (0 g Intravenous End Infusion 04/28/24 1928)  acetaminophen  (TylenoL ) suppository 650 mg (650 mg Rectal Given 04/28/24 1837)  magnesium  sulfate 2g/50ml (4%) in water IVPB 2 g (2 g Intravenous New Bag 04/28/24 1927)  Lactated Ringers  (LR) infusion (1,000 mL Intravenous New Bag 04/28/24 2035)      "

## 2024-05-02 NOTE — Discharge Summary (Signed)
 Discharge Summary   Admit Date: 04/28/2024 Discharge Date:  05/02/2024 DOS: 05/02/2024 Primary Care Provider:  Out Of State PCP, [place]  Patient ID: Natasha Rollins [id number] y.o. [id number]    Discharge Medication List     PAUSE taking these medications    warfarin 5 mg Tabs Wait to take this until: May 04, 2024 Take 1 Tab by Mouth Once a Day. Refills: 0 Commonly known as: Coumadin         START taking these medications    cefdinir 300 mg Caps Start date: May 03, 2024 End date: May 06, 2024 Take 1 Cap by Mouth Twice Daily for 3 days. For UTI Dispense: 6 Cap Refills: 0 Commonly known as: Omnicef    oxyCODONE 5 mg Tabs Take 1 Tab by Mouth 3 Times Daily As Needed for Moderate Pain (Pain Score 4-6) or Severe Pain (Pain Score 7-10) for up to 3 days. Dispense: 9 Tab Refills: 0 Commonly known as: Roxicodone        CONTINUE taking these medications    amLODIPine  10 mg Tabs Take 1 Tab by Mouth Once a Day. Refills: 0 Commonly known as: Norvasc     Cinnamon 500 mg Caps Take 2 Caps by Mouth Once a Day. Refills: 0 Generic drug: Cinnamon Bark    ergocalciferol 50,000 unit Caps Take 1 Cap by Mouth Every 7 Days. Refills: 0 Commonly known as: Vitamin D2    folic acid  1 mg Tabs Take 1 Tab by Mouth Once a Day. Refills: 0 Commonly known as: Folvite     HYDROcodone -acetaminophen  5-325 mg Tabs Every 6 Hours As Needed. Refills: 0 Commonly known as: Norco 5    Lantus  Solostar U-100 Insulin  100 unit/mL (3 mL) insulin  pen Inject 35 Units beneath the skin Twice Daily. Refills: 0 Generic drug: Insulin  Glargine    losartan -hydroCHLOROthiazide  100-25 mg Tabs Take 1 Tab by Mouth Once a Day. Refills: 0 Commonly known as: Hyzaar    Mounjaro 10 mg/0.5 mL Pen injector Inject 0.5 mL beneath the skin Every 7 Days. Refills: 0 Generic drug: tirzepatide    NovoLOG  Flexpen U-100 Insulin  100 unit/mL (3 mL) insulin  pen Inject 50 Units beneath the skin  3 Times Daily. Refills: 0 Generic drug: Insulin  Aspart    pregabalin  50 mg Caps Take 1 Cap by Mouth Twice Daily. Refills: 0 Commonly known as: Lyrica     rosuvastatin  20 mg Tabs Take 1 Tab by Mouth Every Monday, Wednesday & Friday. Refills: 0 Commonly known as: Crestor         STOP taking these medications      Reason for Stopping  ciprofloxacin HCl 500 mg Tabs Commonly known as: Cipro    nitrofurantoin monohydrate macro 100 mg Caps Commonly known as: Macrobid        Discharge Procedure Orders  Diet as Follows:   Cardiac Diet and Diabetic 1800 CHO   Additional Instructions as Follows:   Check your blood pressure daily and record. Take this recordings with you to your next doctor's appointment.   Check and record your blood sugar as prior to hospitalization. Take these recordings with you to your next doctor's appointment.   Additional Instructions as Follows:   Take the entire course of your antibiotics, even if your symptoms improve.   Follow up with PCP   Follow up with your primary care provider within 7-14 days. Please call to set up your follow up appointment.   Discharge Instructions   If you were prescribed an antibiotic, antiviral, or anti-fungal medicine,  take it as told by your health care provider. Do not stop taking the medicine even if you start to feel better.  Monitoring your blood pressure, temperature and blood oxygen level can help with early detection of recurring illness. If you do not have these devices, please discuss this with our provider or nurse.  Take over-the-counter and prescription medicines only as told by your healthcare provider.  We recommend having a family member, friend, or caregiver present in the home twice daily for the first 14 days to help: o   Monitor your temperature, blood pressure, pulse, and oxygen rate. o   Make sure you are eating and drinking enough. o   Make sure you are active frequently throughout the day. o    Assist you with making and getting to your appointments.  Eat 2-3 nutritious meals daily. Do not skip meals. Nutrition is important for your recovery.   Drink enough fluid to keep your urine pale yellow unless otherwise directed by your provider.  IF YOU BEGIN EXPERIENCING NEW OR UNRESOLVED SYMPTOMS OF AN INFECTION, CALL YOUR DOCTOR IMMEDIATELY! These symptoms include:  o   Body temperature greater than 101 or less than 96.8 degrees Fahrenheit. o   Chills. o   Breathing that is very fast or you feel like you cannot catch your breath.  o   A fast heartbeat. o   Signs/symptoms can vary based on where the infection is located. They may include:        o   Increased urinary frequency        o   Extreme headaches and neck stiffness       o   Swelling        o   Redness        o   Drainage        o   Increasing pain and/or odor        o   Changes in your routine body functions        o   Using the bathroom much less than usual        o   You do not feel like eating        o   You have nausea or vomiting  o   Change in mental status - acting confused or feeling light-headed  o   Cool, clammy skin or red-flushed skin.  o   Skin rash - a new one not previously diagnosed or an existing one that is not going away. o   Low blood pressure- this may cause you to feel like you might pass out. You can have an infection without a fever or changes in body temperature.  Maintain a clean environment.   Wash your hands and stay away from others who are sick.   If you are unsure if your symptoms are related to infection, call your doctor.    Disposition:  Home  Condition at discharge: Afebrile, Ambulating, Eating, Drinking, Voiding, and Stable  Discharge Diagnosis: Nephritis with sepsis Persistent UTI Metabolic encephalopathy due to sepsis-resolved Diarrhea Type 2 diabetes on insulin  History of DVT on warfarin  The following require follow-up:  Complete the course of antibiotics.  Start the  warfarin at your home dose on Sunday evening.  Follow-up with outpatient care team to recheck INR, would like you also to have your urine rechecked for clearance of infection.  Hospital Course:     HPI and Hospital course She is brought to the  hospital from [facility]. she and her family are in the area for the Christmas holiday celebration.  She is from the [place]  area and came with one of her adult sons.  Her sons noticed that she was weak and confused with significantly altered mental status with some diarrhea and brought her to the ER for evaluation   Sepsis with fever and tachycardia likely urinary source admitted to ICU initially clinical management.  She was downgraded to the medical floor 04/29/2024.  She was having some mild hyperglycemia without a anion gap acidosis with a slight elevation in beta hydroxybutyrate.  Not felt to be clinically in DKA, insulin  was uptitrated with better control of blood sugars  She was admitted to the ICU for severe sepsis with pyelonephritis and metabolic encephalopathy.  She was recently diagnosed with a UTI by outside provider that was resistant to empiric antibiotic's.  She did not have a chance to start Cipro and became encephalopathic, hypotensive requiring fluid resuscitation and close monitoring in ICU for 24 hours.  She was able to be downgraded to the medical floor and continued IV therapy for broad-spectrum antibiotics narrowed to Rocephin  based off of outside sensitivities reviewed in the medical record.  She had some mild diarrhea C. difficile testing negative.  Blood sugars were controlled with reduced doses of insulin .  She will be discharged back on her regular doses.  INR was monitored and was slightly supratherapeutic.  Will have her restart warfarin at her home dose on Sunday and follow-up closely with outpatient care team in a week   Pyelonephritis with sepsis Persistent UTI Metabolic encephalopathy due to sepsis resolved  04/30/2024 Sepsis resolved 04/30/2024 Patient reports previous UTI.  Identified by PCP do not have direct culture results available she was notified that her UA was not susceptible to chosen antibiotics.  She did not had the chance to start new antibiotic therapy and presented with acute sepsis with confusion and altered mental status weakness and hypotension.  She responded well to IV fluids and broad-spectrum antibiotics with clearance of mental status returning to baseline 04/29/2024 and was downgraded from ICU to medical floor. Pyelonephritis seen on imaging will continue broad-spectrum antibiotics consider a course of antibiotic therapy for total of 10 days will need to review outside records and identify infectious organism Current culture negative to date Blood cultures revealed Staph epidermidis, no MRSA detected, will repeat blood cultures tomorrow morning Transition from Zosyn to Rocephin  05/01/2024.  Plan to continue a total of 10 days of antibiotics with oral cephalosporin either cefpodoxime or cefdinir Reviewed outpatient culture, low colony count, multi organism Proteus mirabilis, Klebsiella, possibly E. Coli Resistant to tetracyclines and Bactrim, pansensitive   Diarrhea Having 5 loose bowel movements daily with the initiation of Zosyn, C. difficile testing negative Will provide Imodium, is improving    Type 2 diabetes on insulin  Continue Lantus  10 units daily increasing scheduled mealtime insulin  to 5 units plus medium sliding scale   History of DVT on warfarin Continue warfarin, appreciate pharmacy dosing goal INR 2.0-3.0    HPI and Hospital course She is brought to the hospital from [place] [facility].  She and her family are in the area for the Christmas holiday celebration.  She is from the [place]  area and came with one of her adult sons.  Her sons noticed that she was weak and confused with significantly altered mental status with some diarrhea and brought her to the ER  for evaluation   Sepsis with fever and tachycardia  likely urinary source admitted to ICU initially clinical management.  She was downgraded to the medical floor 04/29/2024.  She was having some mild hyperglycemia without a anion gap acidosis with a slight elevation in beta hydroxybutyrate.  Not felt to be clinically in DKA, insulin  was uptitrated with better control of blood sugars    Physical Exam on Discharge: BP 140/65   Pulse 83   Temp 98.2 F (36.8 C)   Resp 20   Ht 64 (1.626 m)   Wt (!) 150.7 kg (332 lb 3.7 oz)   SpO2 95%   BMI 57.03 kg/m  Physical Exam  Const: NAD, alert morbidly obese Chest: Lungs clear to auscultation bilaterally, no wheezing, rhonchi, crackles CV: Regular, no murmur Abd:, Nontender, nondistended, active bowel sounds Neuro: Moving extremities appropriately, non-focal exam MSK/Skin: Warm, no edema  Pertinent Labs:   CBC w/Diff Lab Results  Component Value Date/Time   WBC 10.7 05/01/2024 03:59 AM   RBC 3.25 (L) 05/01/2024 03:59 AM   HEMOGLOBIN 9.1 (L) 05/01/2024 03:59 AM   HCT 28.9 (L) 05/01/2024 03:59 AM   MCV 89 05/01/2024 03:59 AM   MCH 28 05/01/2024 03:59 AM   MCHC 32 05/01/2024 03:59 AM   RDW 16.3 (H) 05/01/2024 03:59 AM   PLATELET 290 05/01/2024 03:59 AM   MPV 10.0 05/01/2024 03:59 AM   SEGS 78 (H) 05/01/2024 03:59 AM   LYMPHOCYTES 11 (L) 05/01/2024 03:59 AM   MONOS 8 05/01/2024 03:59 AM   EOS 1 05/01/2024 03:59 AM   BASOS 0 05/01/2024 03:59 AM   Lab Results  Component Value Date   NA 135 05/01/2024   POTASSIUM 3.5 05/01/2024   CHLORIDE 100 05/01/2024   CO2 22 05/01/2024   ANIONGAP 13.0 05/01/2024   BUN 18 05/01/2024   CREAT 1.1 05/01/2024   GLUCOSE 167 (H) 05/01/2024   CALCIUM  8.2 (L) 05/01/2024   PHOSPHORUS 2.2 (L) 04/29/2024   Recent Results (from the past 2 weeks)  1. EKG 12-Lead  Result Value Ref Range   Heart Rate 89 bpm   RR Interval 684 ms   Atrial Rate 91 ms   P-R Interval 155 ms   P Duration 111 ms   P  Horizontal Axis 21 deg   P Front Axis 47 deg   Q Onset 502 ms   QRSD Interval 91 ms   QT Interval 421 ms   QTcB 509 ms   QTcF 478 ms   QRS Horizontal Axis -66 deg   QRS Axis -15 deg   I-40 Front Axis 86 deg   t-40 Horizontal Axis -86 deg   T-40 Front Axis -22 deg   T Horizontal Axis -75 deg   T Wave Axis 18 deg   S-T Horizontal Axis 65 deg   S-T Front Axis 76 deg   Impression - BORDERLINE ECG -    Impression SR-Sinus rhythm-normal P axis, V-rate 50-99    Impression      VPC-Ventricular premature complex-V complex w/ short R-R interval   Impression LVHV-LVH by voltage-R >1.10 in aVL    Impression      T0AN-Borderline  T abnormalities, anterior leads-T flat or neg, V2-V4  2. Echo Cardiogram Complete  Result Value Ref Range   EF Echo 51    Narrative   CONCLUSIONS   * For the indication of Arrhythmia's.   * Left ventricular systolic function is mildly reduced with an ejection fraction of 51 % by Simpson's biplane.   * Mild global hypokinesis.   *  Left ventricular wall thickness is moderately increased.   * Left ventricular diastolic function: indeterminate.   * Left ventricular chamber size is normal.   * Right ventricular chamber dimension is normal.   * There is no aortic valve stenosis.   * No pulmonary hypertension, estimated pulmonary arterial systolic pressure is 35 mmHg.   * Dilated inferior vena cava diameter with <50% collapse upon inspiration consistent with elevated right atrial pressure, 8 mmHg.   Patient Info Name:     Natasha Rollins Age:     63 years DOB:     1960-07-19 Gender:     Female MRN:     [id number] Ht:     64 in Wt:     327 lb BSA:     2.69 m2 HR:     91 bpm BP:     112 /     49 mmHg Heart Rhythm:     Sinus Arrhythmia Exam Date:     04/30/2024 7:31 AM Patient Status:     IP  Exam Type:     ECHO CARDIOGRAM COMPLETE  Indications     Arrhythmias: atrial fibrillation/SVT/VT -   Left Ventricle   Left ventricular systolic  function is mildly reduced with an ejection fraction of 51 % by Simpson's biplane. Left ventricular chamber size is normal. Left ventricular wall thickness is moderately increased. Left ventricular diastolic function: indeterminate. Mild global hypokinesis.  Right Ventricle   Tricuspid annular plane systolic excursion (TAPSE) is normal, 1.8 cm. Right ventricular systolic function is normal. Right ventricular chamber dimension is normal.  Ventricular Septum   Intact interventricular septum visualized by color Doppler imaging.   Left Atrium   Left atrial chamber is normal with a left atrial volume index biplane method of disk (BP MOD) of 15 ml/m^2.  Right Atrium   Right atrial chamber size is normal.  Atrial Septum   Intact interatrial septum visualized by color Doppler imaging.   Aortic Valve   The aortic valve is tricuspid. There is no aortic valve stenosis. There is mild diffuse thickening (sclerosis) of the aortic valve cusps. There is no aortic valve regurgitation.  Pulmonic Valve   The pulmonic valve is not well visualized. There is no pulmonic valve stenosis. There is no pulmonic regurgitation.  Mitral Valve   The mitral valve has thickened leaflets. There is no mitral valve stenosis. There is trace mitral valve regurgitation. Mitral valve has mild annular calcification.  Tricuspid Valve   The tricuspid valve leaflets are normal. There is no tricuspid valve stenosis. There is mild tricuspid valve regurgitation. No pulmonary hypertension, estimated pulmonary arterial systolic pressure is 35 mmHg.   Pericardium/Pleural   The pericardium has a prominent epicardial fat pad present. There is no pericardial effusion.  Inferior Vena Cava   Dilated inferior vena cava diameter with <50% collapse upon inspiration consistent with elevated right atrial pressure, 8 mmHg.  Aorta   The proximal ascending aorta is normal measuring 3.20 cm with an index of 1.2 cm/m2. The aortic root at the  sinus of Valsalva is normal measuring 2.70 cm with an index of 1.0. The aortic measurements are indexed to body surface area.   Left Ventricular Outflow Tract ---------------------------------------------------------------------- Name                                 Value        Normal ----------------------------------------------------------------------  LVOT Doppler ---------------------------------------------------------------------- LVOT  Peak Velocity               95.0 cm/s                LVOT Peak Gradient                3.0 mmHg                LVOT Mean Gradient                2.0 mmHg                LVOT VTI                           18.7 cm  Pulmonic Valve ---------------------------------------------------------------------- Name                                 Value        Normal ----------------------------------------------------------------------  PV Doppler ---------------------------------------------------------------------- PV Peak Velocity                109.0 cm/s                PV Peak Gradient                  4.0 mmHg                PV Mean Gradient                  3.0 mmHg  Mitral Valve ---------------------------------------------------------------------- Name                                 Value        Normal ----------------------------------------------------------------------  MV Doppler ---------------------------------------------------------------------- MV Peak Gradient                  3.0 mmHg                MV Mean Gradient                  2.7 mmHg                MV VTI                             21.1 cm                MV Decel Slope                   516 cm/s2                MV PHT                               53 ms                MV Area (PHT)                      4.2 cm2       4.0-5.0   MV Diastolic Function ---------------------------------------------------------------------- MV E Peak Velocity               91.7 cm/s  MV A Peak Velocity               75.5 cm/s                MV E/A                                 1.2       0.8-2.0  MV Decel Time PW                    169 ms                 MV Annular TDI ---------------------------------------------------------------------- MV Septal e' Velocity             9.5 cm/s         >=7.0  MV E/e' (Septal)                       9.7                MV Lateral e' Velocity            9.0 cm/s        >=10.0  MV E/e' (Lateral)                     10.2                MV e' Average                     9.2 cm/s                MV E/e' (Average)                      9.9        <=14.0  Tricuspid Valve ---------------------------------------------------------------------- Name                                 Value        Normal ----------------------------------------------------------------------  TV Regurgitation Doppler ---------------------------------------------------------------------- TR Peak Velocity                262.0 cm/s       <=280.0  TR Peak Gradient                 24.0 mmHg                 Estimated PAP/RSVP ---------------------------------------------------------------------- RA Pressure                         8 mmHg            <8  PA Systolic Pressure               35 mmHg           <35  RV Systolic Pressure               35 mmHg           <36  Aorta ---------------------------------------------------------------------- Name                                 Value        Normal ----------------------------------------------------------------------  Ascending  Aorta ---------------------------------------------------------------------- Sinus of Valsalva Diameter          2.7 cm                Prox Asc Ao Diameter                3.2 cm         <=3.1  Prox Asc Ao Diameter Index       1.2 cm/m2         <=1.9  Aortic Valve ---------------------------------------------------------------------- Name                                 Value         Normal ----------------------------------------------------------------------  AV Doppler ---------------------------------------------------------------------- AV Peak Velocity                   1.3 m/s                AV Peak Gradient                  7.0 mmHg                AV Mean Gradient                  5.0 mmHg                AV VTI                             27.5 cm                 AV Dimensionless Index (Peak Velocity)                        0.7                 AV Dimensionless Index (VTI)                                  0.7  Ventricles ---------------------------------------------------------------------- Name                                 Value        Normal ----------------------------------------------------------------------  LV Dimensions 2D/MM ----------------------------------------------------------------------  IVS Diastolic Thickness (2D)                                0.9 cm       0.5-0.9  LVID Diastole (2D)                  5.4 cm       3.8-5.2   LVPW Diastolic Thickness (2D)                                0.9 cm       0.5-0.9  LVID Systole (2D)                   3.6 cm       2.2-3.5  LVID Diastolic Index (2D)        2.0 cm/m2  2.3-3.1   LV Fractional Shortening/Ejection Fraction 2D/MM ---------------------------------------------------------------------- LV EF (2D Teichholz)                  61 %                LV EF (BP MOD)                        51 %         54-74   LV Fractional Shortening (2D)                                33.3 %     27.0-45.0   LV Diastolic Volume Index (BP MOD)                        37.0 ml/m2     29.0-61.0  LV SV (2C MOD)                     39.5 ml                LV SV (BP MOD)                     50.9 ml                LV SV (Cubed)                     110.8 ml                LV SV (Teich)                      86.9 ml                LV EDV (Cubed)                    157.5 ml                LV ESV (Cubed)                      46.7 ml                LV EDV (Teich)                    141.3 ml                LV ESV (Teich)                     54.4 ml                 RV Dimensions 2D/MM ----------------------------------------------------------------------  RV Basal Diastolic Dimension                           3.2 cm       2.5-4.1  TAPSE                               1.8 cm         >=1.7  RV s' Velocity  11.7 cm/s  Atria ---------------------------------------------------------------------- Name                                 Value        Normal ----------------------------------------------------------------------  LA Dimensions ---------------------------------------------------------------------- LA Dimension (2D)                   3.8 cm       2.7-3.8  LA Dimen Index (2D)              1.4 cm/m2                LA Volume (BP A-L)                 42.1 ml                LA Volume Index (BP A-L)        15.7 ml/m2         <35.0  LA Volume Index (BP MOD)        14.9 ml/m2        <=34.0  LA/Ao (2D)                             1.4                 RA Dimensions ----------------------------------------------------------------------  RA Diastolic Major Axis Length (4C)                         4.8 cm                 RA Diastolic Major Axis Length Index (4C)                      0.2                RA Area (4C)                      12.2 cm2         <21.0  RA ESV Index (4C MOD)            9.4 ml/m2     15.0-27.0   Wall Motion Scoring Wall Motion Scoring Index:     1.00   Technical Quality:     Technically difficult study  Procedure(s)   Complete transthoracic echocardiogram performed with 2D imaging, color Doppler, and spectral Doppler. Ultrasound enhancing agent is used.  Staff Ordering Provider:     Leanora JONELLE Lukes Sonographer:     Jodie Rowlee RDCS  [phone]  Image Enhancing Agent/Agitated Saline  ------------------------------ Imaging Agent/Ag. Saline:      Definity  Amount:     1.3 ml Administered By:     Tech Testing IV Site:     IV - right antecubital   Report Signatures Amended by Ozell GORMAN Reed  MD on 04/30/2024 09:24 AM Finalized by Ozell GORMAN Reed  MD on 04/30/2024 08:55 AM  3. EKG 12-LEAD  Result Value Ref Range   Heart Rate 105 bpm   RR Interval 573 ms   Atrial Rate 204 ms   P-R Interval 135 ms   P Duration 93 ms   P Horizontal Axis 3 deg   P Front Axis 59 deg   Q Onset 500 ms   QRSD Interval  90 ms   QT Interval 379 ms   QTcB 501 ms   QTcF 456 ms   QRS Horizontal Axis -80 deg   QRS Axis -20 deg   I-40 Front Axis 13 deg   t-40 Horizontal Axis -85 deg   T-40 Front Axis -22 deg   T Horizontal Axis  deg   T Wave Axis 25 deg   S-T Horizontal Axis 53 deg   S-T Front Axis 121 deg   Impression - ABNORMAL ECG -    Impression ST-Sinus tachycardia-rate>99    Impression      -Intermittent aberrant LBBB pattern occurring with PACs-   Impression LVHV-LVH by voltage-R >1.10 in aVL    Impression      T0AN-Borderline  T abnormalities, anterior leads-T flat or neg, V2-V4  4. EKG 12-LEAD  Result Value Ref Range   Heart Rate 103 bpm   RR Interval 580 ms   Atrial Rate 104 ms   P-R Interval 156 ms   P Duration 106 ms   P Horizontal Axis 6 deg   P Front Axis 73 deg   Q Onset 507 ms   QRSD Interval 155 ms   QT Interval 393 ms   QTcB 516 ms   QTcF 471 ms   QRS Horizontal Axis 262 deg   QRS Axis -41 deg   I-40 Front Axis -29 deg   t-40 Horizontal Axis 249 deg   T-40 Front Axis -50 deg   T Horizontal Axis 69 deg   T Wave Axis 113 deg   S-T Horizontal Axis 68 deg   S-T Front Axis 115 deg   Impression - ABNORMAL ECG -    Impression ST-Sinus tachycardia-rate>99    Impression      LBBB-Left bundle branch block-QRSd>120, broad/notched R   Impression      STVCD-ST elevation secondary to IVCD-Multiple VCG criteria  5. CT CHEST/ABD/PELVIS W/O CONTRAST   Narrative   EXAM: CT CHEST/ABD/PELVIS W/O CONTRAST  CLINICAL  INDICATION/HISTORY: Fever. Altered mental status. In bed for 3 days. Nausea, vomiting   COMPARISON: No prior studies are available for comparison. Chart review shows patient has numerous outside facility prior studies, not available for review at this time.  TECHNIQUE: Axial slices were obtained from chest, abdomen, and pelvis from above the thoracic inlet through the symphysis pubis. Coronal and sagittal reconstructions were created from the axial data set. MA modulation was used to reduce dose on this exam.  CONTRAST: No IV contrast.  FINDINGS:  Lungs: Linear dependent opacities in the left lung favoring atelectasis and hypoventilatory changes. No pleural effusion or pneumothorax.  Mediastinum: Cardiomegaly. No pericardial effusion. Scattered coronary calcifications. Great vessels are otherwise unremarkable in noncontrast appearance. No evidence of pathologically enlarged mediastinal lymph nodes.  Chest soft tissues: Enlarged heterogeneous thyroid .  Liver and gallbladder: Hepatic parenchyma is unremarkable in noncontrast appearance. Cholecystectomy.  Spleen: Unremarkable.  Pancreas: Unremarkable in noncontrast appearance.  Kidneys: No hydronephrosis. No calcified stones. Prominent perinephric stranding, left greater than right. No evidence of discrete walled off perinephric fluid collection.  Adrenals: Unremarkable.  Peritoneal cavity and retroperitoneum: No free air. No significant ascites. No evidence of pathologically enlarged abdominal or pelvic lymph nodes.  Bowel: No evidence of bowel obstruction. Nondilated appendix. No bowel pneumatosis.  Bladder: Urinary bladder decompressed and otherwise unremarkable. Heterogeneous partially calcified large mass of the uterus measuring approximately 6.9 cm compatible with large calcified fibroid.  Bones: No evidence of acute bony abnormality. Lumbar degenerative changes.  Vasculature: Scattered aortic  atherosclerosis. No abdominal  aortic aneurysm. Vasculature otherwise unremarkable in noncontrast appearance.  Other: Scattered areas of fat necrosis in the simultaneous tissues of the ventral abdominal wall. No evidence of discrete fluid collection in the abdominal wall.    Impression      1. Possible polynephritis.  There is bilateral perinephric stranding, asymmetrically prominent on the left. While this can be an incidental finding an seen with different medical renal diseases, the asymmetric appearance and degree of stranding is suspicious for pyelonephritis.  There is no hydronephrosis. No nephroureterolithiasis or bladder calculi. No evidence of discrete walled off perinephric fluid collection.  Otherwise, there is no CT evidence of acute abnormality in the abdomen or pelvis.   2. Mild appearing dependent atelectasis in the lungs.  3. Cardiomegaly. Scattered coronary calcifications.  4. No evidence of bowel traction. No free air. No evidence of acute appendicitis. No bowel pneumatosis. No abdominal or pelvic fluid collection.  5. Large partially calcified uterine mass measuring 6.9 cm, most compatible with a partially calcified fibroid.  6. Enlarged, heterogeneous thyroid . Chart review shows that patient has had this previously evaluated at an outside facility. Correlate with outside facility evaluation   Signed By: Craig KATHEE Fought, MD on 04/28/2024 10:06 PM   6. ED CT HEAD NO CONTRAST   Narrative   EXAM:  ED CT HEAD NO CONTRAST  CLINICAL INDICATION/HISTORY: altered mental status, fever    COMPARISON:  No prior studies available for comparison   TECHNIQUE: Computerized tomography of the head was performed without contrast material. KVP/MA was adjusted for patient size.   FINDINGS:    Streak artifact decreases sensitivity in the skull base and occipital region.  Ventricles and cortical sulci: Prominent consistent with generalized parenchymal volume loss.  Parenchyma: No intraparenchymal  hemorrhage, mass effect, midline shift or extra-axial fluid collection. Portions of cerebellum and occipital region are partially obscured by streak artifact. These areas are grossly unremarkable.  Sinuses: Visualized paranasal sinuses and mastoid air cells are well aerated.    Impression     1. No CT evidence of acute intracranial abnormality.   Signed By: Craig KATHEE Fought, MD on 04/28/2024 10:10 PM   7. EKG 12-LEAD  Result Value Ref Range   Heart Rate 180 bpm   RR Interval 332 ms   Atrial Rate 183 ms   P-R Interval 117 ms   P Duration 112 ms   P Horizontal Axis 8 deg   P Front Axis 9 deg   Q Onset 501 ms   QRSD Interval 140 ms   QT Interval 318 ms   QTcB 552 ms   QTcF 459 ms   QRS Horizontal Axis 228 deg   QRS Axis -73 deg   I-40 Front Axis -59 deg   t-40 Horizontal Axis 226 deg   T-40 Front Axis 269 deg   T Horizontal Axis 43 deg   T Wave Axis 107 deg   S-T Horizontal Axis 68 deg   S-T Front Axis 122 deg   Impression - ABNORMAL ECG -    Impression      VTACH-Extreme tachycardia with wide complex, no further rhythm analysis  attempted-    40 minutes were spent on this discharge today.  This note was completed using [employer]. Grammatical errors, random word insertions, pronouns errors, incomplete sentences, and other errors are possible due to software limitations. It is possible that I may have missed such errors when carefully reviewing the note. If the reader has  questions or concerns regarding these errors or this note, please address them with me for clarification.  SHAUN CARPENTER, DO

## 2024-05-09 ENCOUNTER — Emergency Department (HOSPITAL_COMMUNITY)

## 2024-05-09 ENCOUNTER — Emergency Department (HOSPITAL_COMMUNITY)
Admission: EM | Admit: 2024-05-09 | Discharge: 2024-05-10 | Disposition: A | Discharge status: Home / Self Care | Attending: Emergency Medicine | Admitting: Emergency Medicine

## 2024-05-09 DIAGNOSIS — Z794 Long term (current) use of insulin: Secondary | ICD-10-CM | POA: Diagnosis not present

## 2024-05-09 DIAGNOSIS — R309 Painful micturition, unspecified: Secondary | ICD-10-CM | POA: Insufficient documentation

## 2024-05-09 DIAGNOSIS — R300 Dysuria: Secondary | ICD-10-CM | POA: Diagnosis not present

## 2024-05-09 DIAGNOSIS — R5381 Other malaise: Secondary | ICD-10-CM | POA: Insufficient documentation

## 2024-05-09 DIAGNOSIS — Z7901 Long term (current) use of anticoagulants: Secondary | ICD-10-CM | POA: Insufficient documentation

## 2024-05-09 DIAGNOSIS — R509 Fever, unspecified: Secondary | ICD-10-CM | POA: Diagnosis not present

## 2024-05-09 DIAGNOSIS — I10 Essential (primary) hypertension: Secondary | ICD-10-CM | POA: Insufficient documentation

## 2024-05-09 DIAGNOSIS — E1165 Type 2 diabetes mellitus with hyperglycemia: Secondary | ICD-10-CM | POA: Diagnosis not present

## 2024-05-09 DIAGNOSIS — Z79899 Other long term (current) drug therapy: Secondary | ICD-10-CM | POA: Diagnosis not present

## 2024-05-09 DIAGNOSIS — R000 Tachycardia, unspecified: Secondary | ICD-10-CM | POA: Diagnosis not present

## 2024-05-09 DIAGNOSIS — R5383 Other fatigue: Secondary | ICD-10-CM | POA: Insufficient documentation

## 2024-05-09 LAB — URINALYSIS, ROUTINE W REFLEX MICROSCOPIC
Bacteria, UA: NONE SEEN
Bilirubin Urine: NEGATIVE
Glucose, UA: NEGATIVE mg/dL
Hgb urine dipstick: NEGATIVE
Ketones, ur: NEGATIVE mg/dL
Nitrite: NEGATIVE
Protein, ur: 30 mg/dL — AB
Specific Gravity, Urine: 1.015 (ref 1.005–1.030)
pH: 5 (ref 5.0–8.0)

## 2024-05-09 LAB — CBC WITH DIFFERENTIAL/PLATELET
Abs Immature Granulocytes: 0.6 K/uL — ABNORMAL HIGH (ref 0.00–0.07)
Basophils Absolute: 0.1 K/uL (ref 0.0–0.1)
Basophils Relative: 0 %
Eosinophils Absolute: 0.1 K/uL (ref 0.0–0.5)
Eosinophils Relative: 0 %
HCT: 31.5 % — ABNORMAL LOW (ref 36.0–46.0)
Hemoglobin: 9.7 g/dL — ABNORMAL LOW (ref 12.0–15.0)
Immature Granulocytes: 3 %
Lymphocytes Relative: 15 %
Lymphs Abs: 2.8 K/uL (ref 0.7–4.0)
MCH: 27.9 pg (ref 26.0–34.0)
MCHC: 30.8 g/dL (ref 30.0–36.0)
MCV: 90.5 fL (ref 80.0–100.0)
Monocytes Absolute: 1.3 K/uL — ABNORMAL HIGH (ref 0.1–1.0)
Monocytes Relative: 7 %
Neutro Abs: 13.3 K/uL — ABNORMAL HIGH (ref 1.7–7.7)
Neutrophils Relative %: 75 %
Platelets: 488 K/uL — ABNORMAL HIGH (ref 150–400)
RBC: 3.48 MIL/uL — ABNORMAL LOW (ref 3.87–5.11)
RDW: 18.4 % — ABNORMAL HIGH (ref 11.5–15.5)
WBC: 18.1 K/uL — ABNORMAL HIGH (ref 4.0–10.5)
nRBC: 0.1 % (ref 0.0–0.2)

## 2024-05-09 LAB — I-STAT CHEM 8, ED
BUN: 8 mg/dL (ref 8–23)
Calcium, Ion: 1.14 mmol/L — ABNORMAL LOW (ref 1.15–1.40)
Chloride: 94 mmol/L — ABNORMAL LOW (ref 98–111)
Creatinine, Ser: 1.2 mg/dL — ABNORMAL HIGH (ref 0.44–1.00)
Glucose, Bld: 98 mg/dL (ref 70–99)
HCT: 30 % — ABNORMAL LOW (ref 36.0–46.0)
Hemoglobin: 10.2 g/dL — ABNORMAL LOW (ref 12.0–15.0)
Potassium: 3.2 mmol/L — ABNORMAL LOW (ref 3.5–5.1)
Sodium: 138 mmol/L (ref 135–145)
TCO2: 30 mmol/L (ref 22–32)

## 2024-05-09 LAB — COMPREHENSIVE METABOLIC PANEL WITH GFR
ALT: 19 U/L (ref 0–44)
AST: 26 U/L (ref 15–41)
Albumin: 3.6 g/dL (ref 3.5–5.0)
Alkaline Phosphatase: 64 U/L (ref 38–126)
Anion gap: 14 (ref 5–15)
BUN: 8 mg/dL (ref 8–23)
CO2: 29 mmol/L (ref 22–32)
Calcium: 9.6 mg/dL (ref 8.9–10.3)
Chloride: 97 mmol/L — ABNORMAL LOW (ref 98–111)
Creatinine, Ser: 1.09 mg/dL — ABNORMAL HIGH (ref 0.44–1.00)
GFR, Estimated: 56 mL/min — ABNORMAL LOW
Glucose, Bld: 97 mg/dL (ref 70–99)
Potassium: 3.4 mmol/L — ABNORMAL LOW (ref 3.5–5.1)
Sodium: 140 mmol/L (ref 135–145)
Total Bilirubin: 0.7 mg/dL (ref 0.0–1.2)
Total Protein: 7.1 g/dL (ref 6.5–8.1)

## 2024-05-09 LAB — RESP PANEL BY RT-PCR (RSV, FLU A&B, COVID)  RVPGX2
Influenza A by PCR: NEGATIVE
Influenza B by PCR: NEGATIVE
Resp Syncytial Virus by PCR: NEGATIVE
SARS Coronavirus 2 by RT PCR: NEGATIVE

## 2024-05-09 LAB — I-STAT CG4 LACTIC ACID, ED: Lactic Acid, Venous: 1.5 mmol/L (ref 0.5–1.9)

## 2024-05-09 LAB — PROTIME-INR
INR: 1.5 — ABNORMAL HIGH (ref 0.8–1.2)
Prothrombin Time: 18.9 s — ABNORMAL HIGH (ref 11.4–15.2)

## 2024-05-09 MED ORDER — IOHEXOL 350 MG/ML SOLN
100.0000 mL | Freq: Once | INTRAVENOUS | Status: AC | PRN
  Administered 2024-05-09: 100 mL via INTRAVENOUS

## 2024-05-09 MED ORDER — SODIUM CHLORIDE 0.9 % IV BOLUS
1000.0000 mL | Freq: Once | INTRAVENOUS | Status: AC
  Administered 2024-05-09: 1000 mL via INTRAVENOUS

## 2024-05-09 NOTE — ED Provider Notes (Signed)
 " [place] EMERGENCY DEPARTMENT AT [facility] Provider Note   CSN: [id number] Arrival date & time: 05/09/24  1309     Patient presents with: Dysuria   Ronella Plunk is a 64 y.o. female.   64 year old female with prior medical history as detailed below presents for evaluation.  Patient complains of feeling weak and fatigued.  She complains that she is still sick.  Patient was admitted to [facility] facility in [place]  last week.  She was treated for pyelonephritis.  She was discharged and followed up with her PCP earlier this week. She finished course of Cefdinir 12/30.  She reports continued malaise, fatigue despite course of antibiotics.  Prior medical history significant for morbid obesity, HTN, DM2, DVT on Coumadin .  See excerpt from recent discharge summary (12/26)from [facility] below:  Discharge Diagnosis: Nephritis with sepsis Persistent UTI Metabolic encephalopathy due to sepsis-resolved Diarrhea Type 2 diabetes on insulin  History of DVT on warfarin   The following require follow-up:  Complete the course of antibiotics.  Start the warfarin at your home dose on Sunday evening.  Follow-up with outpatient care team to recheck INR, would like you also to have your urine rechecked for clearance of infection.  Hospital Course:       HPI and Hospital course She is brought to the hospital from [facility]. she and her family are in the area for the Christmas holiday celebration.  She is from the [place]  area and came with one of her adult sons.  Her sons noticed that she was weak and confused with significantly altered mental status with some diarrhea and brought her to the ER for evaluation   Sepsis with fever and tachycardia likely urinary source admitted to ICU initially clinical management.  She was downgraded to the medical floor 04/29/2024.  She was having some mild hyperglycemia without a anion gap acidosis with a slight elevation in beta  hydroxybutyrate.  Not felt to be clinically in DKA, insulin  was uptitrated with better control of blood sugars   She was admitted to the ICU for severe sepsis with pyelonephritis and metabolic encephalopathy.  She was recently diagnosed with a UTI by outside provider that was resistant to empiric antibiotic's.  She did not have a chance to start Cipro and became encephalopathic, hypotensive requiring fluid resuscitation and close monitoring in ICU for 24 hours.  She was able to be downgraded to the medical floor and continued IV therapy for broad-spectrum antibiotics narrowed to Rocephin  based off of outside sensitivities reviewed in the medical record.  She had some mild diarrhea C. difficile testing negative.  Blood sugars were controlled with reduced doses of insulin .  She will be discharged back on her regular doses.  INR was monitored and was slightly supratherapeutic.  Will have her restart warfarin at her home dose on Sunday and follow-up closely with outpatient care team in a week   The history is provided by the patient and medical records.       Prior to Admission medications  Medication Sig Start Date End Date Taking? Authorizing Provider  acetaminophen  (TYLENOL ) 325 MG tablet Take 2 tablets (650 mg total) by mouth every 6 (six) hours as needed for mild pain (pain score 1-3) or moderate pain (pain score 4-6). 03/20/24   Hongalgi, Anand D, MD  amLODipine  (NORVASC ) 10 MG tablet Take 10 mg by mouth daily. 06/07/14   [provider]  B-D UF III MINI PEN NEEDLES 31G X 5 MM MISC USE AS DIRECTED  5 TIMES A DAY 12/20/18   [provider]  Continuous Glucose Sensor (DEXCOM G7 SENSOR) MISC as directed.    [provider]  folic acid  (FOLVITE ) 1 MG tablet Take 1 mg by mouth daily.    [provider]  insulin  aspart (NOVOLOG  FLEXPEN) 100 UNIT/ML FlexPen Inject 25-35 Units into the skin 3 (three) times daily with meals.    [provider]  Lancets (ONETOUCH  DELICA PLUS LANCET30G) MISC USE AS DIRECTED ONCE A DAY. 06/17/18   [provider]  LANTUS  SOLOSTAR 100 UNIT/ML Solostar Pen Inject 30 Units into the skin 2 (two) times daily. 09/08/22   [provider]  losartan -hydrochlorothiazide  (HYZAAR) 100-25 MG tablet Take 1 tablet by mouth daily. 12/20/17   [provider]  MOUNJARO 15 MG/0.5ML Pen Inject 15 mg into the skin once a week.    [provider]  predniSONE  (DELTASONE ) 10 MG tablet Take 4 tabs (40 mg total) daily x 3 days, then 3 tabs (30 mg total) daily x 3 days, then 2 tabs (20 mg total) daily x 3 days, then 1 tab (10 mg total) daily x 3 days, then stop. 03/21/24   Hongalgi, Anand D, MD  pregabalin  (LYRICA ) 50 MG capsule pregabalin  50 mg capsule  TAKE 1 CAPSULE BY MOUTH TWICE A DAY    [provider]  rosuvastatin  (CRESTOR ) 10 MG tablet rosuvastatin  10 mg tablet  TAKE 1 TABLET BY MOUTH FOR 3 DAYS A WEEK 10/13/20   [provider]  Vitamin D, Ergocalciferol, (DRISDOL) 1.25 MG (50000 UNIT) CAPS capsule Take 50,000 Units by mouth once a week. 03/22/22   [provider]  warfarin (COUMADIN ) 5 MG tablet Take 1 tablet (5 mg total) by mouth daily at 6 PM. 09/11/16 03/21/24  Levora Reyes SAUNDERS, MD    Allergies: Vitamin d, Penicillins, and Saxagliptin    Review of Systems  All other systems reviewed and are negative.   Updated Vital Signs BP 122/89   Pulse (!) 112   Temp 98.7 F (37.1 C) (Oral)   Resp 17   SpO2 98%   Physical Exam Vitals and nursing note reviewed.  Constitutional:      General: She is not in acute distress.    Appearance: She is well-developed.  HENT:     Head: Normocephalic and atraumatic.  Eyes:     Conjunctiva/sclera: Conjunctivae normal.  Cardiovascular:     Rate and Rhythm: Normal rate and regular rhythm.     Heart sounds: No murmur heard. Pulmonary:     Effort: Pulmonary effort is normal. No respiratory distress.     Breath sounds: Normal breath sounds.   Abdominal:     Palpations: Abdomen is soft.     Tenderness: There is no abdominal tenderness.  Musculoskeletal:        General: No swelling.     Cervical back: Neck supple.  Skin:    General: Skin is warm and dry.     Capillary Refill: Capillary refill takes less than 2 seconds.  Neurological:     Mental Status: She is alert.  Psychiatric:        Mood and Affect: Mood normal.     (all labs ordered are listed, but only abnormal results are displayed) Labs Reviewed  URINALYSIS, ROUTINE W REFLEX MICROSCOPIC - Abnormal; Notable for the following components:      Result Value   APPearance HAZY (*)    Protein, ur 30 (*)    Leukocytes,Ua TRACE (*)  All other components within normal limits  URINE CULTURE  COMPREHENSIVE METABOLIC PANEL WITH GFR  CBC WITH DIFFERENTIAL/PLATELET  PROTIME-INR  I-STAT CHEM 8, ED  I-STAT CG4 LACTIC ACID, ED    EKG: None  Radiology: No results found.   Procedures   Medications Ordered in the ED - No data to display                                  Medical Decision Making Patient presents with complaint of malaise, fatigue.  She reports that she feels ill.  She was admitted in [place]  for pyelonephritis last week.  She reports that she has not yet returned back to her normal state of health after discharge.  She is concerned that she has recurrent infection.  Obtained UA is without clear evidence of infection.  COVID and flu testing are negative.  Obtained electrolytes are without significant acute abnormality.  Potassium is 3.4.,  Creatinine is 1.09.  White count is 18.  This appears to be close to baseline.  Hemoglobin is 9.7.  This also appears to be close to baseline.  INR is 1.5.  She is on Coumadin  with history of DVT.  Coumadin  was held during her recent hospitalization.  Patient is requesting IV fluids and CT imaging to further evaluate her complaint of malaise and fatigue.  Oncoming EDP aware of case.    Amount and/or  Complexity of Data Reviewed Labs: ordered. Radiology: ordered.  Risk Prescription drug management.        Final diagnoses:  Malaise    ED Discharge Orders          Ordered    ondansetron  (ZOFRAN -ODT) 4 MG disintegrating tablet  Every 8 hours PRN        05/10/24 0100               Laurice Maude BROCKS, MD 05/17/24 1439  "

## 2024-05-09 NOTE — ED Triage Notes (Signed)
 BIB EMS from home 'i just feel sick'. Upon investigation patient was recently dc'ed for urosepsis. Patient is a poor historian. C/o painful urination.

## 2024-05-09 NOTE — ED Notes (Signed)
 Urine in lab

## 2024-05-09 NOTE — ED Notes (Addendum)
 Pt provided crackers and ginger ale, only ate 1 cracker and few sips of ginger ale

## 2024-05-09 NOTE — Progress Notes (Signed)
 Unit RN able to establish USPIV and obtain blood

## 2024-05-09 NOTE — ED Provider Notes (Addendum)
" °  Provider Note MRN:  [id number]  Arrival date & time: 05/10/2024    ED Course and Medical Decision Making  Assumed care of patient at sign-out or upon transfer.  Chief complaint of malaise, recent admission at outside hospital for pyelonephritis.  Workup thus far reassuring, she is subtherapeutic on her Coumadin .  Awaiting scans.  Would be candidate for discharge if reassuring CT imaging.  12:45 AM update: CT imaging is reassuring.  Patient resting comfortably on my evaluation with normal vital signs.  Heart rate in the high 90s, no tachypnea, no increased work of breathing.  She is not convinced by this very reassuring workup.  She still feels generally unwell with malaise and nausea.  She is also mostly concerned about her living situation currently.  She uses a wheelchair and has no assistance at home.  With her recent symptoms that she has been having a hard time caring for herself.  She is interested in home health services.  She does feel comfortable going home in the morning because she will be able to call family and friends to help her at home.  Will provide her with resources for contacting case management/social work.  Plan is for a.m. discharge.  Procedures  Final Clinical Impressions(s) / ED Diagnoses     ICD-10-CM   1. Malaise  R53.81       ED Discharge Orders     None       Discharge Instructions   None     Ozell HERO. Theadore, MD [facility] Emergency Medicine [facility] [email or web address]    Theadore Ozell HERO, MD 05/10/24 9943    Theadore Ozell HERO, MD 05/10/24 [phone]  "

## 2024-05-10 ENCOUNTER — Telehealth: Payer: Self-pay | Admitting: Podiatry

## 2024-05-10 LAB — URINE CULTURE

## 2024-05-10 MED ORDER — ONDANSETRON 4 MG PO TBDP: 4.0000 mg | ORAL_TABLET | Freq: Three times a day (TID) | ORAL | 0 refills | Status: AC | PRN

## 2024-05-10 MED ORDER — ONDANSETRON 4 MG PO TBDP
4.0000 mg | ORAL_TABLET | Freq: Once | ORAL | Status: AC
  Administered 2024-05-10: 4 mg via ORAL
  Filled 2024-05-10: qty 1

## 2024-05-10 NOTE — Discharge Instructions (Addendum)
 You were evaluated in the Emergency Department and after careful evaluation, we did not find any emergent condition requiring admission or further testing in the hospital.  Your exam/testing today was overall reassuring.  Recommend follow-up with your regular doctors to discuss your symptoms.  Call [phone] to speak with social work regarding home health assistance.  Use the Zofran  as needed at home for nausea.  Plenty fluids and rest.  Please return to the Emergency Department if you experience any worsening of your condition.  Thank you for allowing us  to be a part of your care.

## 2024-05-10 NOTE — Care Management (Signed)
 Patient is wheelchair bound at home has some support Home health ordered, Sent out in [place], [place] accepted.

## 2024-05-10 NOTE — ED Notes (Signed)
 Per pt wheelchair fleeta is otw to pick up patient

## 2024-05-10 NOTE — ED Notes (Signed)
 [name] sister called and notified of pt discharge. Sister states she will be here at 0730 to pick up pt.

## 2024-05-10 NOTE — Telephone Encounter (Signed)
 Patient contacted answering service via son and her daughter-in-law.  She was actually seen in the ED yesterday for malaise.  They did obtain a chest angio to rule out PE which was not found.  Of note she was recently admitted to an outside hospital for pyelonephritis.  They called stating that she is complaining of significant pain throughout the entire leg and at the calf is very tender with posterior squeeze.  They report that she is experiencing chills and subjective fever.  She does take hydrocodone  for pain.  I advised that they return to emergency room for evaluation of possible DVT in the leg or call to see if this can be done in urgent care due to concern for wait times. Discussed importance of having this ruled out.

## 2024-05-12 ENCOUNTER — Telehealth: Payer: Self-pay | Admitting: Podiatry

## 2024-05-12 NOTE — Telephone Encounter (Signed)
 Patient called in stating that she thought her appointment was today however he appointment is scheduled for next Monday. She mentioned that she hasn't been able to walk all weekend and would like to let Dr. Gershon know that she is in a lot of pain and is concerned about ending up in the hospital again.

## 2024-05-16 ENCOUNTER — Ambulatory Visit

## 2024-05-16 ENCOUNTER — Ambulatory Visit (INDEPENDENT_AMBULATORY_CARE_PROVIDER_SITE_OTHER): Payer: Worker's Compensation | Admitting: Podiatry

## 2024-05-16 ENCOUNTER — Encounter: Payer: Self-pay | Admitting: Podiatry

## 2024-05-16 DIAGNOSIS — M7752 Other enthesopathy of left foot: Secondary | ICD-10-CM

## 2024-05-16 DIAGNOSIS — M7751 Other enthesopathy of right foot: Secondary | ICD-10-CM

## 2024-05-16 MED ORDER — TRIAMCINOLONE ACETONIDE 10 MG/ML IJ SUSP
5.0000 mg | Freq: Once | INTRAMUSCULAR | Status: AC
  Administered 2024-05-16: 5 mg via INTRAMUSCULAR

## 2024-05-16 NOTE — Progress Notes (Unsigned)
 Subjective: Chief Complaint  Patient presents with   Foot Problem    Patient presents today for capsulitis of the Right foot patient reports pain on both feet but the right one is worst .    64 year old female presents with above concerns.  She states the pain is started coming back.  After the last injection her pain went away to her ankles.  She does not notice any increase in swelling, redness or any warmth of the ankles no open lesions.  No injuries that she reports.  Objective: AAO x3, NAD DP/PT pulses palpable bilaterally, CRT less than 3 seconds On the left and right foot there is tenderness palpation of the sinus tarsi.  There is some mild chronic appearing edema but there is no erythema, warmth.  There is no crepitus or pain with ankle joint range of motion there is no pain to the ankle joint itself.  Most the tenderness is still localized along the sinus tarsi bilaterally.  No pain with calf compression, swelling, warmth, erythema  Assessment: Capsulitis, bilateral subtalar joint  Plan: -All treatment options discussed with the patient including all alternatives, risks, complications.  -Reviewed her hospitalizations as well as blood work.  I do not see that she has got any active infection in her foot, ankle but she is to monitor this.  Patient continues to have ongoing pain and the injections are not helping we will get repeat imaging.  I recommend her to see her PCP. -She wishes to proceed with steroid injections today.  I cleaned the skin with Betadine, alcohol.  Mixture 1 cc betamethasone , 1 cc Marcaine plain was infiltrated into the sinus tarsi bilaterally.  Postinjection care discussed.  She tolerated well any complications.  Return in about 4 weeks (around 06/13/2024) for ankle pain bilateral .  Donnice JONELLE Fees DPM   Still aquatic therap Stj inj Recent hopsital

## 2024-05-16 NOTE — Patient Instructions (Signed)

## 2024-05-19 ENCOUNTER — Ambulatory Visit: Admitting: Podiatry

## 2024-06-13 ENCOUNTER — Ambulatory Visit: Payer: Worker's Compensation | Admitting: Podiatry

## 2024-06-13 DIAGNOSIS — Z789 Other specified health status: Secondary | ICD-10-CM

## 2024-06-13 DIAGNOSIS — S96911S Strain of unspecified muscle and tendon at ankle and foot level, right foot, sequela: Secondary | ICD-10-CM

## 2024-06-13 DIAGNOSIS — R2681 Unsteadiness on feet: Secondary | ICD-10-CM

## 2024-06-18 ENCOUNTER — Ambulatory Visit: Admitting: Obstetrics

## 2024-06-20 ENCOUNTER — Ambulatory Visit: Admitting: Obstetrics

## 2024-08-19 ENCOUNTER — Ambulatory Visit: Admitting: Podiatry
